# Patient Record
Sex: Female | Born: 1940 | Race: Black or African American | Hispanic: No | Marital: Single | Smoking: Current every day smoker
Health system: Southern US, Community
[De-identification: ages and names within clinical notes are randomized; demographics above are authoritative.]

## PROBLEM LIST (undated history)

## (undated) DIAGNOSIS — I454 Nonspecific intraventricular block: Secondary | ICD-10-CM

## (undated) DIAGNOSIS — I509 Heart failure, unspecified: Secondary | ICD-10-CM

## (undated) DIAGNOSIS — I493 Ventricular premature depolarization: Secondary | ICD-10-CM

## (undated) DIAGNOSIS — I2510 Atherosclerotic heart disease of native coronary artery without angina pectoris: Secondary | ICD-10-CM

## (undated) DIAGNOSIS — Z9889 Other specified postprocedural states: Secondary | ICD-10-CM

## (undated) DIAGNOSIS — Z95810 Presence of automatic (implantable) cardiac defibrillator: Secondary | ICD-10-CM

## (undated) DIAGNOSIS — E7800 Pure hypercholesterolemia, unspecified: Secondary | ICD-10-CM

## (undated) DIAGNOSIS — I10 Essential (primary) hypertension: Secondary | ICD-10-CM

## (undated) DIAGNOSIS — I255 Ischemic cardiomyopathy: Secondary | ICD-10-CM

## (undated) DIAGNOSIS — I213 ST elevation (STEMI) myocardial infarction of unspecified site: Secondary | ICD-10-CM

## (undated) HISTORY — DX: Pure hypercholesterolemia, unspecified: E78.00

## (undated) HISTORY — PX: OTHER SURGICAL HISTORY: SHX169

## (undated) HISTORY — DX: Other specified postprocedural states: Z98.890

## (undated) HISTORY — DX: Essential (primary) hypertension: I10

## (undated) HISTORY — PX: BREAST CYST EXCISION: SHX579

## (undated) HISTORY — DX: Atherosclerotic heart disease of native coronary artery without angina pectoris: I25.10

## (undated) HISTORY — PX: ABDOMINAL HYSTERECTOMY: SHX81

---

## 1996-07-22 DIAGNOSIS — I213 ST elevation (STEMI) myocardial infarction of unspecified site: Secondary | ICD-10-CM

## 1996-07-22 HISTORY — DX: ST elevation (STEMI) myocardial infarction of unspecified site: I21.3

## 2000-03-02 ENCOUNTER — Other Ambulatory Visit: Admission: RE | Admit: 2000-03-02 | Discharge: 2000-03-02 | Payer: Self-pay | Admitting: Gynecology

## 2000-12-16 ENCOUNTER — Encounter: Admission: RE | Admit: 2000-12-16 | Discharge: 2000-12-16 | Payer: Self-pay | Admitting: Internal Medicine

## 2000-12-16 ENCOUNTER — Encounter: Payer: Self-pay | Admitting: Gynecology

## 2001-03-09 ENCOUNTER — Other Ambulatory Visit: Admission: RE | Admit: 2001-03-09 | Discharge: 2001-03-09 | Payer: Self-pay | Admitting: Gynecology

## 2001-04-02 ENCOUNTER — Other Ambulatory Visit: Admission: RE | Admit: 2001-04-02 | Discharge: 2001-04-02 | Payer: Self-pay | Admitting: Gynecology

## 2001-04-02 ENCOUNTER — Encounter (INDEPENDENT_AMBULATORY_CARE_PROVIDER_SITE_OTHER): Payer: Self-pay

## 2001-04-05 ENCOUNTER — Ambulatory Visit (HOSPITAL_COMMUNITY): Admission: RE | Admit: 2001-04-05 | Discharge: 2001-04-05 | Payer: Self-pay | Admitting: Cardiology

## 2001-04-05 ENCOUNTER — Encounter: Payer: Self-pay | Admitting: Cardiology

## 2002-11-07 ENCOUNTER — Other Ambulatory Visit: Admission: RE | Admit: 2002-11-07 | Discharge: 2002-11-07 | Payer: Self-pay | Admitting: Gynecology

## 2002-12-06 ENCOUNTER — Encounter: Admission: RE | Admit: 2002-12-06 | Discharge: 2002-12-06 | Payer: Self-pay | Admitting: Gynecology

## 2002-12-06 ENCOUNTER — Encounter: Payer: Self-pay | Admitting: Gynecology

## 2004-05-01 ENCOUNTER — Other Ambulatory Visit: Admission: RE | Admit: 2004-05-01 | Discharge: 2004-05-01 | Payer: Self-pay | Admitting: Gynecology

## 2006-03-16 ENCOUNTER — Other Ambulatory Visit: Admission: RE | Admit: 2006-03-16 | Discharge: 2006-03-16 | Payer: Self-pay | Admitting: Gynecology

## 2006-06-03 DIAGNOSIS — Z9889 Other specified postprocedural states: Secondary | ICD-10-CM

## 2006-06-03 HISTORY — DX: Other specified postprocedural states: Z98.890

## 2006-06-09 ENCOUNTER — Encounter (INDEPENDENT_AMBULATORY_CARE_PROVIDER_SITE_OTHER): Payer: Self-pay | Admitting: Specialist

## 2006-06-09 ENCOUNTER — Ambulatory Visit: Payer: Self-pay | Admitting: Gastroenterology

## 2006-06-09 ENCOUNTER — Ambulatory Visit (HOSPITAL_COMMUNITY): Admission: RE | Admit: 2006-06-09 | Discharge: 2006-06-09 | Payer: Self-pay | Admitting: Gastroenterology

## 2008-02-29 ENCOUNTER — Ambulatory Visit: Payer: Self-pay | Admitting: Cardiovascular Disease

## 2008-03-13 ENCOUNTER — Ambulatory Visit: Payer: Self-pay | Admitting: Cardiology

## 2008-03-13 ENCOUNTER — Encounter (HOSPITAL_COMMUNITY): Admission: RE | Admit: 2008-03-13 | Discharge: 2008-04-12 | Payer: Self-pay | Admitting: Cardiovascular Disease

## 2008-03-13 ENCOUNTER — Encounter: Payer: Self-pay | Admitting: Cardiovascular Disease

## 2008-06-01 ENCOUNTER — Ambulatory Visit: Payer: Self-pay | Admitting: Cardiovascular Disease

## 2008-06-01 ENCOUNTER — Ambulatory Visit (HOSPITAL_COMMUNITY): Admission: RE | Admit: 2008-06-01 | Discharge: 2008-06-01 | Payer: Self-pay | Admitting: Cardiovascular Disease

## 2009-01-22 ENCOUNTER — Ambulatory Visit: Payer: Self-pay | Admitting: Cardiology

## 2009-03-27 ENCOUNTER — Ambulatory Visit (HOSPITAL_COMMUNITY): Admission: RE | Admit: 2009-03-27 | Discharge: 2009-03-27 | Payer: Self-pay | Admitting: Gynecology

## 2009-06-06 ENCOUNTER — Encounter: Payer: Self-pay | Admitting: Cardiology

## 2009-06-08 ENCOUNTER — Encounter: Payer: Self-pay | Admitting: Cardiology

## 2009-06-11 LAB — CONVERTED CEMR LAB
ALT: 10 units/L (ref 0–35)
AST: 13 units/L (ref 0–37)
Bilirubin, Direct: 0.1 mg/dL (ref 0.0–0.3)
Cholesterol: 143 mg/dL (ref 0–200)
Indirect Bilirubin: 0.5 mg/dL (ref 0.0–0.9)
Total CHOL/HDL Ratio: 3.5

## 2010-01-11 ENCOUNTER — Ambulatory Visit: Payer: Self-pay | Admitting: Cardiology

## 2010-01-11 DIAGNOSIS — I2510 Atherosclerotic heart disease of native coronary artery without angina pectoris: Secondary | ICD-10-CM

## 2010-01-11 DIAGNOSIS — E785 Hyperlipidemia, unspecified: Secondary | ICD-10-CM

## 2010-01-11 DIAGNOSIS — I252 Old myocardial infarction: Secondary | ICD-10-CM

## 2010-01-11 DIAGNOSIS — F17200 Nicotine dependence, unspecified, uncomplicated: Secondary | ICD-10-CM | POA: Insufficient documentation

## 2010-01-17 ENCOUNTER — Encounter (HOSPITAL_COMMUNITY): Admission: RE | Admit: 2010-01-17 | Discharge: 2010-02-16 | Payer: Self-pay | Admitting: Cardiology

## 2010-01-17 ENCOUNTER — Ambulatory Visit: Payer: Self-pay | Admitting: Cardiology

## 2010-01-17 ENCOUNTER — Encounter: Payer: Self-pay | Admitting: Cardiology

## 2010-01-21 ENCOUNTER — Encounter (INDEPENDENT_AMBULATORY_CARE_PROVIDER_SITE_OTHER): Payer: Self-pay | Admitting: *Deleted

## 2010-01-21 ENCOUNTER — Encounter: Payer: Self-pay | Admitting: Cardiology

## 2010-01-21 LAB — CONVERTED CEMR LAB
Alkaline Phosphatase: 75 units/L (ref 39–117)
BUN: 8 mg/dL (ref 6–23)
Glucose, Bld: 96 mg/dL (ref 70–99)
HDL: 41 mg/dL (ref >39)
LDL Cholesterol: 73 mg/dL (ref 0–99)
Total Bilirubin: 0.9 mg/dL (ref 0.3–1.2)
Triglycerides: 104 mg/dL (ref <150)
VLDL: 21 mg/dL (ref 0–40)

## 2010-01-23 ENCOUNTER — Encounter: Payer: Self-pay | Admitting: Cardiology

## 2010-01-23 ENCOUNTER — Ambulatory Visit: Payer: Self-pay | Admitting: Cardiology

## 2010-03-29 ENCOUNTER — Ambulatory Visit (HOSPITAL_COMMUNITY): Admission: RE | Admit: 2010-03-29 | Discharge: 2010-03-29 | Payer: Self-pay | Admitting: Gynecology

## 2010-06-14 ENCOUNTER — Encounter: Payer: Self-pay | Admitting: Cardiology

## 2010-11-25 ENCOUNTER — Encounter: Payer: Self-pay | Admitting: Cardiovascular Disease

## 2010-12-03 NOTE — Letter (Signed)
Summary: [place] [facility], agricultural at [facility]  [address], [place] [address]   Phone: [phone]  Fax: [phone]      January 21, 2010 MRN: [id number]   Nicole Clements [address] [place], [place]  [address]   Dear Ms. Luciana Axe,  Your test ordered by Selena Batten has been reviewed by your physician (or physician assistant) and was found to be normal or stable. Your physician (or physician assistant) felt no changes were needed at this time.  ____ Echocardiogram  __x__ Cardiac Stress Test  __x__ Lab Work  ____ Peripheral vascular study of arms, legs or neck  ____ CT scan or X-ray  ____ Lung or Breathing test  ____ Other: No change in medical treatment at this time, per Dr. Daleen Squibb.  We will obtain an echocardiogram to access left ventricular function, no other tests needed at this time.   Thank you, Latresa Gasser Allyne Gee RN    [place] Bing, MD, Lenise Arena.C.Gaylord Shih, MD, F.A.C.C Lewayne Bunting, MD, F.A.C.C Nona Dell, MD, F.A.C.C Charlton Haws, MD, Lenise Arena.C.C

## 2010-12-03 NOTE — Letter (Signed)
Summary: [place] [facility] (Nuc Med Stress)  [facility]  [address] [place] [address]   Phone: [phone]  Fax: [phone]    Nuclear Medicine 1-Day Stress Test Information Sheet  Re:     Nicole Clements   DOB:     December 25, 1940 MRN:     [id number] Weight:  Appointment Date: Register at: Appointment Time: Referring MD:  ___Exercise Stress  __Adenosine   __Dobutamine  _X_Lexiscan  __Persantine   __Thallium  Urgency: ____1 (next day)   ____2 (one week)    ____3 (PRN)  Patient will receive Follow Up call with results: Patient needs follow-up appointment:  Instructions regarding medication:  How to prepare for your stress test: 1. DO NOT eat or dring 6 hours prior to your arrival time. This includes no caffeine (coffee, tea, sodas, chocolate) if you were instructed to take your medications, drink water with it. 2. DO NOT use any tobacco products for at leaset 8 hours prior to arrival. 3. DO NOT wear dresses or any clothing that may have metal clasps or buttons. 4. Wear short sleeve shirts, loose clothing, and comfortalbe walking shoes. 5. DO NOT use lotions, oils or powder on your chest before the test. 6. The test will take approximately 3-4 hours from the time you arrive until completion. 7. To register the day of the test, go to the Short Stay entrance at [facility]. 8. If you must cancel your test, call [phone] as soon as you are aware.  After you arrive for test:   When you arrive at [facility], you will go to Short Stay to be registered. They will then send you to Radiology to check in. The Nuclear Medicine Tech will get you and start an IV in your arm or hand. A small amount of a radioactive tracer will then be injected into your IV. This tracer will then have to circulate for 30-45 minutes. During this time you will wait in the waiting room and you will be able to drink something without caffeine. A series of pictures will be taken of  your heart follwoing this waiting period. After the 1st set of pictures you will go to the stress lab to get ready for your stress test. During the stress test, another small amount of a radioactive tracer will be injected through your IV. When the stress test is complete, there is a short rest period while your heart rate and blood pressure will be monitored. When this monitoring period is complete you will have another set of pictrues taken. (The same as the 1st set of pictures). These pictures are taken between 15 minutes and 1 hour after the stress test. The time depends on the type of stress test you had. Your doctor will inform you of your test results within 7 days after test.    The possibilities of certain changes are possible during the test. They include abnormal blood pressure and disorders of the heart. Side effects of persantine or adenosine can include flushing, chest pain, shortness of breath, stomach tightness, headache and light-headedness. These side effects usually do not last long and are self-resolving. Every effort will be made to keep you comfortable and to minimize complications by obtaining a medical history and by close observation during the test. Emergency equipment, medications, and trained personnel are available to deal with any unusual situation which may arise.  Please notify office at least 48 hours in advance if you are unable to keep  this appt.

## 2010-12-03 NOTE — Letter (Signed)
Summary: [facility] OFFICE NOTE 06-14-10  [facility] OFFICE NOTE 06-14-10   Imported By: Faythe Ghee 06/14/2010 15:12:26  _____________________________________________________________________  External Attachment:    Type:   Image     Comment:   External Document

## 2010-12-03 NOTE — Assessment & Plan Note (Signed)
Summary: F1Y  Medications Added AMLODIPINE BESYLATE 10 MG TABS (AMLODIPINE BESYLATE) take 1 tablet by mouth once daily SIMVASTATIN 40 MG TABS (SIMVASTATIN) take 1 tab daily ASPIR-TRIN 325 MG TBEC (ASPIRIN) take 1 tab daily VITAMIN C CR 500 MG CR-TABS (ASCORBIC ACID) take 1 tab daily VITAMIN D3 2000 UNIT CAPS (CHOLECALCIFEROL) take 1 cap daily VITAMIN E 400 UNIT CAPS (VITAMIN E) take 1 tab daily FISH OIL 1200 MG CAPS (OMEGA-3 FATTY ACIDS) take 2 caps daily CITRACAL PLUS  TABS (MULTIPLE MINERALS-VITAMINS) take 1 tab daily ONE-A-DAY WOMENS FORMULA  TABS (MULTIPLE VITAMINS-CALCIUM) take 1 tab daily      Allergies Added: NKDA  Visit Type:  Follow-up Primary Provider:  [facility] [place]  CC:  no complaints today.  History of Present Illness: Ms Hizer returns today for further evaluation and management of her coronary artery disease.  She is having no angina or ischemic symptoms. She does have some dyspnea on exertion. She still smokes quite heavily.  Her blood pressures always good. His slightly elevated here today.  She denies orthopnea, PND, peripheral edema, palpitations or syncope.  She is taking simvastatin. Lipids were near goal in August. We need to repeat them.    Current Medications (verified): 1)  Amlodipine Besylate 10 Mg Tabs (Amlodipine Besylate) .... Take 1 Tablet By Mouth Once Daily 2)  Simvastatin 40 Mg Tabs (Simvastatin) .... Take 1 Tab Daily 3)  Aspir-Trin 325 Mg Tbec (Aspirin) .... Take 1 Tab Daily 4)  Vitamin C Cr 500 Mg Cr-Tabs (Ascorbic Acid) .... Take 1 Tab Daily 5)  Vitamin D3 2000 Unit Caps (Cholecalciferol) .... Take 1 Cap Daily 6)  Vitamin E 400 Unit Caps (Vitamin E) .... Take 1 Tab Daily 7)  Fish Oil 1200 Mg Caps (Omega-3 Fatty Acids) .... Take 2 Caps Daily 8)  Citracal Plus  Tabs (Multiple Minerals-Vitamins) .... Take 1 Tab Daily 9)  One-A-Day Womens Formula  Tabs (Multiple Vitamins-Calcium) .... Take 1 Tab Daily  Allergies  (verified): No Known Drug Allergies  Review of Systems       negative other than history of present illness  Vital Signs:  Patient profile:   70 year old female Height:      62 inches Weight:      180 pounds BMI:     33.04 Pulse rate:   100 / minute BP sitting:   154 / 79  (right arm)  Vitals Entered By: Dreama Saa, CNA (January 11, 2010 2:34 PM)  Physical Exam  General:  obese.  obese.   Head:  normocephalic and atraumatic Eyes:  PERRLA/EOM intact; conjunctiva and lids normal. Neck:  Neck supple, no JVD. No masses, thyromegaly or abnormal cervical nodes. Chest Keerstin Bjelland:  no deformities or breast masses noted Lungs:  Clear bilaterally to auscultation and percussion. Heart:  regular rate and rhythm, normal S1-S2, PMI poorly appreciated, soft systolic sound at the base of the right neck. Abdomen:  Bowel sounds positive; abdomen soft and non-tender without masses, organomegaly, or hernias noted. No hepatosplenomegaly. Msk:  Back normal, normal gait. Muscle strength and tone normal. Pulses:  pulses normal in all 4 extremities Extremities:  No clubbing or cyanosis. Neurologic:  Alert and oriented x 3. Skin:  Intact without lesions or rashes. Psych:  Normal affect.   Impression & Recommendations:  Problem # 1:  CORONARY ATHEROSCLEROSIS NATIVE CORONARY ARTERY (ICD-414.01) Her last Myoview was in May 2009. With her dyspnea on exertion and ongoing risk factors, we'll repeat. Her updated medication list for this  problem includes:    Amlodipine Besylate 10 Mg Tabs (Amlodipine besylate) .[name]... Take 1 tablet by mouth once daily    Aspir-trin 325 Mg Tbec (Aspirin) .[name]... 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 Take 1 tablet by mouth once daily    Aspir-trin 325 Mg Tbec  (Aspirin) .[name]... Take 1 tab daily  Problem # 3:  DYSLIPIDEMIA (ICD-272.4) Assessment: Improved I will check fasting lipids and a comprehensive metabolic panel. Her updated medication list for this problem includes:    Simvastatin 40 Mg Tabs (Simvastatin) .[name]... Take 1 tab daily  Orders: T-Lipid Profile ([address]) T-Comprehensive Metabolic Panel ([address])  Problem # 4:  TOBACCO USER (ICD-305.1) Assessment: Unchanged advised to quit  Patient Instructions: 1)  Your physician recommends that you schedule a follow-up appointment in: 12 months 2)  Your physician has requested that you have anLexiscan myoview.  For further information please visit [email or web address]  Please follow instruction sheet, as given. 3)  Your physician recommends that you return for lab work in: on same day as [employer] Prescriptions: SIMVASTATIN 40 MG TABS (SIMVASTATIN) take 1 tab daily  #90 x 3   Entered by:   Larita Fife Via LPN   Authorized by:   Gaylord Shih, MD, [facility]   Signed by:   Larita Fife Via LPN on 29/56/2130   Method used:   Electronically to        [facility] (retail)       [address]       [place], [place]  [address]       Ph: [phone]       Fax: [phone]   RxID:   [id number] AMLODIPINE BESYLATE 10 MG TABS (AMLODIPINE BESYLATE) take 1 tablet by mouth once daily  #90 x 3   Entered by:   Larita Fife Via LPN   Authorized by:   Gaylord Shih, MD, [facility]   Signed by:   Larita Fife Via LPN on 63/87/5643   Method used:   Electronically to        [facility] (retail)       [address]       [place], [place]  [address]       Ph: [phone]       Fax: [phone]   RxID:   [id number]

## 2010-12-03 NOTE — Miscellaneous (Signed)
Summary: Orders Update  Clinical Lists Changes  Problems: Added new problem of LEFT VENTRICULAR FUNCTION, DECREASED (ICD-429.2) Orders: Added new Referral order of 2-D Echocardiogram (2D Echo) - Signed

## 2011-01-22 ENCOUNTER — Other Ambulatory Visit: Payer: Self-pay | Admitting: Cardiology

## 2011-01-24 ENCOUNTER — Telehealth: Payer: Self-pay | Admitting: Cardiology

## 2011-01-24 DIAGNOSIS — I2510 Atherosclerotic heart disease of native coronary artery without angina pectoris: Secondary | ICD-10-CM

## 2011-01-24 MED ORDER — AMLODIPINE BESYLATE 10 MG PO TABS: 10.0000 mg | ORAL_TABLET | Freq: Every day | ORAL | Status: DC

## 2011-01-24 NOTE — Telephone Encounter (Signed)
Patient needs refill called in for Amlodopine to [facility] in [place] / pt has appt scheduled in May w/Dr.Wall/tg

## 2011-03-03 ENCOUNTER — Other Ambulatory Visit: Payer: Self-pay | Admitting: *Deleted

## 2011-03-07 ENCOUNTER — Encounter: Payer: Self-pay | Admitting: Cardiology

## 2011-03-07 ENCOUNTER — Other Ambulatory Visit: Payer: Self-pay

## 2011-03-07 ENCOUNTER — Ambulatory Visit (INDEPENDENT_AMBULATORY_CARE_PROVIDER_SITE_OTHER): Payer: Medicare Other | Admitting: Cardiology

## 2011-03-07 DIAGNOSIS — I5022 Chronic systolic (congestive) heart failure: Secondary | ICD-10-CM

## 2011-03-07 DIAGNOSIS — I252 Old myocardial infarction: Secondary | ICD-10-CM

## 2011-03-07 DIAGNOSIS — I2510 Atherosclerotic heart disease of native coronary artery without angina pectoris: Secondary | ICD-10-CM

## 2011-03-07 DIAGNOSIS — F17200 Nicotine dependence, unspecified, uncomplicated: Secondary | ICD-10-CM

## 2011-03-07 DIAGNOSIS — E785 Hyperlipidemia, unspecified: Secondary | ICD-10-CM

## 2011-03-07 MED ORDER — AMLODIPINE BESYLATE 10 MG PO TABS: 10.0000 mg | ORAL_TABLET | Freq: Every day | ORAL | Status: DC

## 2011-03-07 MED ORDER — CARVEDILOL 6.25 MG PO TABS: 6.2500 mg | ORAL_TABLET | Freq: Two times a day (BID) | ORAL | Status: DC

## 2011-03-07 MED ORDER — SIMVASTATIN 40 MG PO TABS: 40.0000 mg | ORAL_TABLET | Freq: Every day | ORAL | Status: DC

## 2011-03-07 NOTE — Assessment & Plan Note (Signed)
Advised to quit.  

## 2011-03-07 NOTE — Assessment & Plan Note (Signed)
Stable. Will add carvedilol which I started years ago for LV preservation and repeat Echo. I will probably add spironolactone as well once her blood work returns. She had a reaction to accupril in the past with diarrhea and refuses to take. I asked her to stop smoking again.

## 2011-03-07 NOTE — Assessment & Plan Note (Signed)
Will reassess LV systolic function with an Echo.

## 2011-03-07 NOTE — Progress Notes (Signed)
   Patient ID: Nicole Clements, female    DOB: 12-21-1940, 70 y.o.   MRN: [id number]  HPI  Ms Ceniceros returns for E and M of her Ischemic CM, chronic SCHF, and hyperlipidemia. She denies any angina or chest pain. She has her baseline DOE. S he still smokes a PPD. N o orthopnea or edema.   Last objective assessment of her CAD was 3/11 with Myoview showing an EF of 29% with mild LV dilatation. N o blood work since last year.  She denies any major wheezing.  EKG today show NSR with IVCD WHICH IS NEW.    Review of Systems  All other systems reviewed and are negative.      Physical Exam  Constitutional: She is oriented to person, place, and time. She appears well-developed and well-nourished. No distress.  HENT:  Head: Normocephalic and atraumatic.  Eyes: EOM are normal. Pupils are equal, round, and reactive to light.  Neck: Normal range of motion. No JVD present. No tracheal deviation present. No thyromegaly present.  Cardiovascular: Normal rate, regular rhythm, S1 normal, S2 normal and normal pulses.   No extrasystoles are present. PMI is displaced.  Exam reveals no S3.   No murmur heard. Pulmonary/Chest:       Decreased breath sounds throughout, no wheezes, rhonchi, or rales  Abdominal: Soft. Bowel sounds are normal.  Musculoskeletal: Normal range of motion. She exhibits no edema.  Neurological: She is alert and oriented to person, place, and time.  Skin: Skin is warm and dry.  Psychiatric: She has a normal mood and affect.

## 2011-03-07 NOTE — Patient Instructions (Signed)
Your physician has requested that you have an echocardiogram. Echocardiography is a painless test that uses sound waves to create images of your heart. It provides your doctor with information about the size and shape of your heart and how well your heart's chambers and valves are working. This procedure takes approximately one hour. There are no restrictions for this procedure.  Your physician recommends that you return for lab work in: Next week  Your physician has recommended you make the following change in your medication: Start taking Coreg 6.25 twice daily, stop taking Multi vitamin, Vitamins C and D.  Your physician discussed the hazards of tobacco use. Tobacco use cessation is recommended and techniques and options to help you quit were discussed.  Your physician recommends that you schedule a follow-up appointment in: 6 months

## 2011-03-07 NOTE — Assessment & Plan Note (Signed)
Needs updated labs.

## 2011-03-12 ENCOUNTER — Other Ambulatory Visit: Payer: Self-pay | Admitting: Cardiology

## 2011-03-13 ENCOUNTER — Ambulatory Visit (HOSPITAL_COMMUNITY)
Admission: RE | Admit: 2011-03-13 | Discharge: 2011-03-13 | Disposition: A | Discharge status: Home / Self Care | Payer: Medicare Other | Source: Ambulatory Visit | Attending: Cardiology | Admitting: Cardiology

## 2011-03-13 DIAGNOSIS — I10 Essential (primary) hypertension: Secondary | ICD-10-CM | POA: Insufficient documentation

## 2011-03-13 DIAGNOSIS — I509 Heart failure, unspecified: Secondary | ICD-10-CM | POA: Insufficient documentation

## 2011-03-13 DIAGNOSIS — I059 Rheumatic mitral valve disease, unspecified: Secondary | ICD-10-CM

## 2011-03-13 LAB — HEPATIC FUNCTION PANEL
AST: 13 U/L (ref 0–37)
Alkaline Phosphatase: 68 U/L (ref 39–117)
Bilirubin, Direct: 0.1 mg/dL (ref 0.0–0.3)
Total Bilirubin: 0.6 mg/dL (ref 0.3–1.2)

## 2011-03-18 NOTE — Assessment & Plan Note (Signed)
[facility]                       [place] CARDIOLOGY OFFICE NOTE   Nicole Clements, Nicole Clements                        MRN:          [id number]  DATE:02/29/2008                            DOB:          May 31, 1941    REASON FOR VISIT:  Nicole Clements is seen today at the request of Dr.  Arvilla Market to be reestablished and F/U of CAD, HTN and increased  cholesterol.   HISTORY:  The patient has previously been seen by Dr. Daleen Squibb back in 2002.  It appears that his letter from May 2002 said that he was managing her  ischemic heart disease with  mild-moderate LV dysfunction, hypertension, hyperlipidemia.  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  Her last Myoview study was done in 2002 which showed normal  perfusion and an EF of 55%.  She previously had a Myoview back in 2000  showing an EF of 44% with global hypokinesis.   There is also mention of PVD in the chart with a left carotid bruit, but  an ultrasound showing no significant disease.  The patient continues to  smoke.  She has a positive family for coronary disease, hyperlipidemia,  untreated and hypertension, untreated.   In the past, she apparently has had a problem with lisinopril or  Accupril causing loose stools.  She previously had tolerated and been on  Norvasc.  The patient has been having some exertional dyspnea.  It seems  functional.  I do not think it is an anginal equivalent.  She has  gained some weight over the last year or two.  She has not had  significant cough or wheezing.  She has not had palpitations, PND,  orthopnea.  No history of PE or DVT.   PAST MEDICAL HISTORY:  1. Remarkable for previous MI in 1997.  2. Outpatient colonoscopy in 2007.  3. Hypertension.  4. Hyperlipidemia.   SOCIAL HISTORY:  She smokes a pack a day.   ALLERGIES:  She has been intolerant to  LISINOPRIL in the past due to the  loose stools.  NO OTHER KNOWN ALLERGIES.   CURRENT MEDICATIONS:  1. Aspirin a day.  2. Caltrate.  3. Vitamin C.  4. Vitamin E.   SOCIAL HISTORY:  She smokes a pack a day.  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  She is not married.  She  has 1 child who lives in [place].  She is otherwise fairly active.  She  does not drink and smokes a pack a day.   FAMILY HISTORY:  Father is unknown.  Mother had a CABG, but at an  elderly age in her 31s.   PHYSICAL EXAMINATION:  VITAL SIGNS:  Blood pressure 158/92, pulse 70 and  regular, afebrile, respiratory rate 16, weight is 185.  HEENT:  Unremarkable.  NECK:  I do not hear a left carotid bruit.  No lymphadenopathy,  thyromegaly, JVP elevation.  LUNGS:  Clear with diaphragmatic  motion.  No wheezing.  HEART:  S1-S2 with normal heart sounds.  PMI normal.  ABDOMEN:  Benign, bowel sounds are positive.  No AAA.  No tenderness.  No hepatosplenomegaly or hepatojugular reflux.  No bruit.  EXTREMITIES:  Distal pulses are intact.  No edema.  No muscular  weakness.  NEURO:  Nonfocal.  SKIN:  Warm and dry.   IMPRESSION:  1. Distant history of an inferior wall myocardial infarction with      variable ejection fractions.  Follow up adenosine Myoview. Continue      aspirin therapy.  2. Question history of cardiomyopathy with ejection fraction 44%.      Check 2-D echocardiogram to reassess LV function and regional wall      motion abnormalities.  3. Hypertension.  Restart Norvasc 10 mg a day.  Follow up in 3 months.      Avoid salt.  4. History of left carotid bruit.  I do not hear one on exam today.      Her previous ultrasound showed no significant disease.  For the      time being, we will not reorder one.  5. Smoking.  The patient needs to quit.  We will discuss Wellbutrin or      Chantix in the future.  She is currently not very motivated in this      regard.  She has  smoked for over 45 years.   FOLLOW UP:  The patient will follow with Dr. Arvilla Market for general  medical needs.  So long as her stress test and echo do not show  decreased LV function or evidence of recurrent ischemic disease, I will  see her in 3 months to reassess her blood pressure on Norvasc.     Noralyn Pick. Eden Emms, MD, [facility]  Electronically Signed    PCN/MedQ  DD: 02/29/2008  DT: 02/29/2008  Job #: [id number]   cc:   Donia Guiles, M.D.

## 2011-03-18 NOTE — Assessment & Plan Note (Signed)
[facility]                       [place] CARDIOLOGY OFFICE NOTE   Nicole Clements, Nicole Clements                        MRN:          [id number]  DATE:01/22/2009                            DOB:          [birth year]    Nicole Clements comes in today for followup.   I saw her initially in 1997.   PROBLEM LIST:  Coronary artery disease.  She had ST-segment elevated  inferior wall infarct July 22, 1996.  At the time she had an  emergency catheterization and 95% stenosis in the distal right coronary  artery.  She had mild inferior hypokinesia.  She had an angioplasty  performed.   She has done remarkably well since then.  Unfortunately, she has stopped  her statin since I met her years ago and also continues to smoke.   She is just retired from [employer] after working 41 years and  commuting from [place] to [place].   She last saw Dr. Eden Emms on her office on June 01, 2008.  She had a  followup Myoview Mar 13, 2008 that showed no ischemia, EF of 35%.  She  had moderate hypokinesia in inferoseptal segments.  Subsequent echo  showed EF 45% with akinesia in inferior basal wall.  There was no  significant mitral regurgitation.   She was asked to return for bilateral carotid ultrasound, but apparently  did not.   PAST MEDICAL HISTORY:   MEDICATIONS:  Currently on  1. Aspirin 325 mg a day.  2. Norvasc 10 mg a day.  3. Fish oil 1200 mg a day.  4. Multivitamin.  5. Vitamin D.   ALLERGIES:  She is intolerant of COREG, which caused diarrhea.  She  really does not want to take an ACE inhibitor, just wants to stick with  her Norvasc.  She has no other drug allergies.   PHYSICAL EXAMINATION:  VITAL SIGNS:  Her blood pressure today is 104/76,  her pulse is 70 and regular.  Weight is 179 down 4.  HEENT:  Normal.  NECK:  Supple.  Carotids upstrokes are equal bilaterally with soft  systolic sounds.  Thyroid is not enlarged.  Trachea is midline.  LUNGS:  Clear with no rhonchi or wheezes.  HEART:  Reveals a regular rate and rhythm.  No gallop, rub, or murmur.  ABDOMEN:  Soft, good bowel sounds.  No pulsatile mass.  EXTREMITIES:  Shows cyanosis, clubbing, or edema.  Pulses were brisk.  NEUROLOGIC:  Intact.   ASSESSMENT AND PLAN:  Nicole Clements is stable from our standpoint.  She is  currently having no symptoms of angina or ischemia.  She has a mild  reduction in left ventricular function, but does not want to take a beta-  blocker or an ACE inhibitor.  She continues to smoke and has resisted  this for years, to try to stop.   I have talked her into taking simvastatin 40 mg p.o. at bedtime.  We  will follow up lipids comprehensive metabolic panel in 6 weeks.  She  will continue on her other medications.  I will see her back again in a  year.     Jesse Sans. Daleen Squibb, MD, [facility]  Electronically Signed    TCW/MedQ  DD: 01/22/2009  DT: 01/23/2009  Job #: [id number]   cc:   Donia Guiles, M.D.

## 2011-03-18 NOTE — Assessment & Plan Note (Signed)
[facility]                       [place] CARDIOLOGY OFFICE NOTE   MATHEW, STORCK                        MRN:          [id number]  DATE:06/01/2008                            DOB:          Mar 11, 1941    Nicole Clements was seen today in followup.  She has a distant history of an  MI.  She had followup Myoview done on Mar 13, 2008.  She had what  appeared to be global hypokinesis with moderate hypokinesis in the basal  inferior and septal segments.  Estimated EF was 25%.   However, she had a follow up echocardiogram on Mar 13, 2008.  EF was  estimated at 45% with akinesis of the inferior basal wall with a  moderate hypokinesis of the basal anterior wall and portions of the  septum.   The patient is currently asymptomatic.  She is not having chest pain.  There is really no significant ischemia on her Myoview.  I talked to the  patient at length.  She clearly has coronary artery disease.  Her MI was  very distant apparently at the RCA with angioplasty back in 2000.   Back at that time, a Myoview showed an EF of 44% with global  hypokinesis.   She also has bilateral carotid bruits.  She needs a follow up carotid  duplex.   I told Nicole Clements that she would have significant problems in the next 5 years  if she does not stop smoking.  She is already had advanced vascular  disease.  She has a bronchitic voice.  She needs a followup chest x-ray,  since she has not had one in over a year.  She really does not see her  primary care doctor, Dr. Arvilla Market, unless she has an acute problem.   REVIEW OF SYSTEMS:  Remarkable for no cough or sputum production.  No  hemoptysis.  She has not had signs of claudication.  There has been no  abdominal pain.  There has been no TIA and she is not having chest pain.  She does have exertional dyspnea and have poor exercise tolerance on the  stress test.   CURRENT MEDICATIONS:  Aspirin a day, Norvasc 10 a day, vitamin D, and  multivitamins.   ALLERGIES:  She has no known allergies.   PHYSICAL EXAMINATION:  GENERAL:  Remarkable for bronchitic female, who  looks older than her stated age.  She is overweight.  VITAL SIGNS:  Weight is 182; blood pressure 140/60; pulse 76 and  regular; and respiratory rate 14, afebrile.  HEENT:  Unremarkable.  NECK:  She has bilateral carotid bruits.  No lymphadenopathy,  thyromegaly, or JVP elevation.  LUNGS:  Clear, good diaphragmatic motion.  No wheezing.  CARDIAC:  S1 and S2.  Normal heart sounds.  ABDOMEN:  Benign.  Bowel sounds positive.  No AAA.  No tenderness.  No  bruit.  No hepatosplenomegaly.  No hepatojugular reflux.  EXTREMITIES:  Distal pulses are intact.  No edema.  NEURO:  Nonfocal.  SKIN:  Warm and dry.  MUSCULOSKELETAL:  No muscular weakness.   IMPRESSION:  1. Coronary artery  disease, distant, currently asymptomatic.  No      ischemia on the Myoview.  The patient would like to continue      Norvasc for the time being.  She has previously had a cough with      ACE inhibitors and did not tolerate Coreg due to fatigue at some      point in time.  It may be reasonable to reassess her ability to      take an ARB or another beta-blocker given her coronary artery      disease, and decreased left ventricular function.  However, Nicole Clements      does not want to consider this at this time and thinks the Norvasc      is doing  a good job with her blood pressure.  2. Bilateral carotid bruits.  Followup carotid duplex.  3. Hypertension improved, controlled on Norvasc, continue this and low-      salt diet.  4. Smoking cessation.  Script for Wellbutrin given and I encouraged      her to follow up with Dr. Arvilla Market, as she has not had a chest x-      ray in over a year, we will do one today.  Risks of advancing her      vascular disease and cancer were discussed at length with the      patient, counseled her for less than 10 minutes regarding smoking      cessation.  5.  Hyperlipidemia.  I will have to call Dr. Roselie Skinner office.  I do      not have recent lab work on her.  There is an old lab in the chart      from Dr. Daleen Squibb showing an LDL was 94.  I suspect that she would      benefit from being on a statin drug.  I will let Dr. Daleen Squibb discuss      this with her on his next visit.  6. Overall Nicole Clements continues to have issues regarding her smoking and      vascular disease, so long as her chest x-ray and carotid duplex do      not show any advanced disease, she will see Dr. Daleen Squibb back in 6      months.     Noralyn Pick. Eden Emms, MD, [facility]  Electronically Signed    PCN/MedQ  DD: 06/01/2008  DT: 06/01/2008  Job #: [id number]

## 2011-03-21 NOTE — Op Note (Signed)
NAME:  Nicole Clements, Nicole Clements                 ACCOUNT NO.:  [id number]   MEDICAL RECORD NO.:  [id number]          PATIENT TYPE:  AMB   LOCATION:  DAY                           FACILITY:  [facility]   PHYSICIAN:  Kassie Mends, M.D.      DATE OF BIRTH:  [birth year]   DATE OF PROCEDURE:  06/09/2006  DATE OF DISCHARGE:                                 OPERATIVE REPORT   PROCEDURE PERFORMED:  Colonoscopy with cold forceps biopsy.   INDICATIONS FOR PROCEDURE:  Nicole Clements is a 70 year old female who presents  for average risk colon cancer screening.   MEDICATIONS:  1. Demerol 50 mg IV.  2. Versed 3 mg IV.   FINDINGS:  1. Three 4 to 6 mm ascending colon and sigmoid colon polyps removed via      cold forceps.  2. Rare sigmoid and descending colon diverticulosis.  3. Small internal hemorrhoids.  4. Otherwise no masses, inflammatory changes or vascular ectasia seen.   RECOMMENDATIONS:  1. Follow-up biopsy.  If polyps are adenomatous, next screening      colonoscopy in five years.  2. High fiber diet.  She was given a hand-out on high fiber diet and      diverticulosis and hemorrhoids.  3. Follow up with Dr. Beather Arbour.   PROCEDURE TECHNIQUE:  An informed consent was obtained.  Physical exam was  performed and informed consent was obtained from the patient after  discussing the alternatives, risks and benefits of the procedure with the  patient which the patient appeared to understand and so stated.  The patient  was placed in the left lateral position.  Continuous oxygen was provided by  nasal cannula and IV medicine administered through an indwelling cannula.  After sedation and rectal exam, the patient's rectum was intubated and the  scope was advanced under direct  visualization to the cecum.  The scope was withdrawn slowly and the mucosa  integrity and anatomy was subsequently evaluated on the way out.  The  patient was recovered in the endoscopy suite and discharged to home in  satisfactory  condition.      Kassie Mends, M.D.  Electronically Signed     SM/MEDQ  D:  06/10/2006  T:  06/10/2006  Job:  [id number]   cc:   Gretta Cool, M.D.  Fax: [phone]

## 2011-03-28 ENCOUNTER — Other Ambulatory Visit (HOSPITAL_COMMUNITY): Payer: Self-pay | Admitting: Gynecology

## 2011-03-28 DIAGNOSIS — Z139 Encounter for screening, unspecified: Secondary | ICD-10-CM

## 2011-04-04 ENCOUNTER — Ambulatory Visit (HOSPITAL_COMMUNITY)
Admission: RE | Admit: 2011-04-04 | Discharge: 2011-04-04 | Disposition: A | Discharge status: Home / Self Care | Payer: Medicare Other | Source: Ambulatory Visit | Attending: Gynecology | Admitting: Gynecology

## 2011-04-04 DIAGNOSIS — Z139 Encounter for screening, unspecified: Secondary | ICD-10-CM

## 2011-04-04 DIAGNOSIS — Z1231 Encounter for screening mammogram for malignant neoplasm of breast: Secondary | ICD-10-CM | POA: Insufficient documentation

## 2011-06-05 ENCOUNTER — Encounter: Payer: Self-pay | Admitting: Gastroenterology

## 2011-07-16 ENCOUNTER — Ambulatory Visit (INDEPENDENT_AMBULATORY_CARE_PROVIDER_SITE_OTHER): Payer: Medicare Other | Admitting: Gastroenterology

## 2011-07-16 ENCOUNTER — Encounter: Payer: Self-pay | Admitting: Gastroenterology

## 2011-07-16 VITALS — BP 110/70 | HR 80 | Temp 97.5°F | Ht 62.0 in | Wt 177.8 lb

## 2011-07-16 DIAGNOSIS — Z860100 Personal history of colon polyps, unspecified: Secondary | ICD-10-CM

## 2011-07-16 DIAGNOSIS — Z9889 Other specified postprocedural states: Secondary | ICD-10-CM

## 2011-07-16 NOTE — Progress Notes (Signed)
Primary Care Physician:  Lupita Raider, MD Primary Gastroenterologist:  Dr. Darrick Penna  Chief Complaint  Patient presents with  . Colonoscopy    HPI:   [name] is a pleasant 70 year old female who presents today for a visit to discuss the need for updated colonoscopy. Her last procedure was in 2007, outlined under PMH. Polyps were hyperplastic. She denies any abdominal pain, change in bowel habits, nausea or vomiting. She has had no rectal bleeding. Her baseline BMs are twice per day. She denies any loss of appetite or wt loss. She denies any reflux. She has no concerns and is doing quite well today.   Past Medical History  Diagnosis Date  . Myocardial infarct 07/22/1996    st.elevated inferior wall infarct  . CAD (coronary artery disease)   . Hypertension   . Hypercholesterolemia   . S/P colonoscopy August 2007    hyperplastic polyps, rare sigmoid and descending colon diverticulosis, small internal hemorrhoids    Past Surgical History  Procedure Date  . Cardiac catheterization   . S/p hysterectomy     Current Outpatient Prescriptions  Medication Sig Dispense Refill  . amLODipine (NORVASC) 10 MG tablet Take 1 tablet (10 mg total) by mouth daily.  90 tablet  1  . aspirin 325 MG tablet Take 325 mg by mouth daily.        . Calcium Citrate (CITRACAL PO) Take 1 tablet by mouth daily.        . carvedilol (COREG) 6.25 MG tablet Take 1 tablet (6.25 mg total) by mouth 2 (two) times daily with a meal.  60 tablet  6  . Cholecalciferol (VITAMIN D3) 2000 UNITS capsule Take 2,000 Units by mouth daily.        . Omega-3 Fatty Acids (FISH OIL) 1000 MG CAPS Take 1 capsule by mouth daily.        . simvastatin (ZOCOR) 40 MG tablet Take 1 tablet (40 mg total) by mouth at bedtime.  90 tablet  1  . amLODipine (NORVASC) 10 MG tablet Take 1 tablet (10 mg total) by mouth daily.  30 tablet  1    Allergies as of 07/16/2011 - Review Complete 07/16/2011  Allergen Reaction Noted  . Sulfa antibiotics Rash  07/16/2011    Family History  Problem Relation Age of Onset  . Colon cancer Neg Hx     History   Social History  . Marital Status: Single    Spouse Name: N/A    Number of Children: 1  . Years of Education: N/A   Occupational History  . retired     Clinical biochemist rep   Social History Main Topics  . Smoking status: Current Everyday Smoker -- 1.0 packs/day for 50 years  . Smokeless tobacco: Never Used  . Alcohol Use: No  . Drug Use: No  . Sexually Active: Not on file   Other Topics Concern  . Not on file   Social History Narrative  . No narrative on file    Review of Systems: Gen: Denies any fever, chills, fatigue, weight loss, lack of appetite.  CV: Denies chest pain, heart palpitations, peripheral edema, syncope.  Resp: Denies shortness of breath at rest or with exertion. Denies wheezing or cough.  GI: Denies dysphagia or odynophagia. Denies jaundice, hematemesis, fecal incontinence. GU : Denies urinary burning, urinary frequency, urinary hesitancy MS: Denies joint pain, muscle weakness, cramps, or limitation of movement.  Derm: Denies rash, itching, dry skin Psych: Denies depression, anxiety, memory loss, and confusion Heme: Denies  bruising, bleeding, and enlarged lymph nodes.  Physical Exam: BP 110/70  Pulse 80  Temp(Src) 97.5 F (36.4 C) (Temporal)  Ht 5\' 2"  (1.575 m)  Wt 177 lb 12.8 oz (80.65 kg)  BMI 32.52 kg/m2 General:   Alert and oriented. Pleasant and cooperative. Well-nourished and well-developed.  Head:  Normocephalic and atraumatic. Eyes:  Without icterus, sclera clear and conjunctiva pink.  Ears:  Normal auditory acuity. Nose:  No deformity, discharge,  or lesions. Mouth:  No deformity or lesions, oral mucosa pink.  Neck:  Supple, without mass or thyromegaly. Lungs:  Clear to auscultation bilaterally. No wheezes, rales, or rhonchi. No distress.  Heart:  S1, S2 present without murmurs appreciated.  Abdomen:  +BS, soft, non-tender and  non-distended. No HSM noted. No guarding or rebound. No masses appreciated.  Rectal:  Deferred  Msk:  Symmetrical without gross deformities. Normal posture. Extremities:  Without clubbing or edema. Neurologic:  Alert and  oriented x4;  grossly normal neurologically. Skin:  Intact without significant lesions or rashes. Cervical Nodes:  No significant cervical adenopathy. Psych:  Alert and cooperative. Normal mood and affect.

## 2011-07-16 NOTE — Patient Instructions (Signed)
We will hold off on a colonoscopy for now.   Please call us if you have any abdominal pain or rectal bleeding.   We will be sending you a letter when it is time to return.

## 2011-07-17 ENCOUNTER — Encounter: Payer: Self-pay | Admitting: Gastroenterology

## 2011-07-17 DIAGNOSIS — Z86010 Personal history of colonic polyps: Secondary | ICD-10-CM | POA: Insufficient documentation

## 2011-07-17 NOTE — Progress Notes (Signed)
Cc to PCP 

## 2011-07-17 NOTE — Assessment & Plan Note (Signed)
70 year old female with history of hyperplastic polyps on colonoscopy in 2007. No hx of adenomatous polyps. She presents today devoid of any lower GI symptoms. She is doing well without any change in bowel habits or evidence of melena or hematochezia. We discussed the need for updated colonoscopy, and it would be appropriate to wait until 2017 for the next colonoscopy. She is agreeable to this and understands she needs to call our office with any changes in the interim. We discussed the signs and symptoms to watch for. We will see her back as needed, and we will plan for a colonoscopy in 5 years.

## 2011-07-18 NOTE — Progress Notes (Signed)
REMINDER PLACED IN EPIC FOR 5 YEAR REPEAT TCS

## 2011-07-23 ENCOUNTER — Other Ambulatory Visit: Payer: Self-pay | Admitting: *Deleted

## 2011-07-28 NOTE — Progress Notes (Signed)
REVIEWED. AGREE. 

## 2011-09-16 ENCOUNTER — Encounter: Payer: Self-pay | Admitting: Cardiology

## 2011-09-16 ENCOUNTER — Ambulatory Visit (INDEPENDENT_AMBULATORY_CARE_PROVIDER_SITE_OTHER): Payer: Medicare Other | Admitting: Cardiology

## 2011-09-16 VITALS — BP 148/74 | HR 77 | Resp 18 | Ht 62.0 in | Wt 177.0 lb

## 2011-09-16 DIAGNOSIS — E785 Hyperlipidemia, unspecified: Secondary | ICD-10-CM

## 2011-09-16 DIAGNOSIS — I2510 Atherosclerotic heart disease of native coronary artery without angina pectoris: Secondary | ICD-10-CM

## 2011-09-16 DIAGNOSIS — I252 Old myocardial infarction: Secondary | ICD-10-CM

## 2011-09-16 DIAGNOSIS — F17200 Nicotine dependence, unspecified, uncomplicated: Secondary | ICD-10-CM

## 2011-09-16 MED ORDER — CARVEDILOL 6.25 MG PO TABS: 6.2500 mg | ORAL_TABLET | Freq: Two times a day (BID) | ORAL | Status: DC

## 2011-09-16 MED ORDER — AMLODIPINE BESYLATE 10 MG PO TABS: 10.0000 mg | ORAL_TABLET | Freq: Every day | ORAL | Status: DC

## 2011-09-16 MED ORDER — SIMVASTATIN 40 MG PO TABS: 40.0000 mg | ORAL_TABLET | Freq: Every day | ORAL | Status: DC

## 2011-09-16 NOTE — Assessment & Plan Note (Signed)
Stable. No change in medications. 

## 2011-09-16 NOTE — Assessment & Plan Note (Signed)
Advised to quit. As mentioned above, she has no intention of stopping. Risk of lung disease and vascular disease emphasized

## 2011-09-16 NOTE — Progress Notes (Signed)
HPI Nicole Clements returns today for evaluation and management of her coronary artery disease and history of myocardial infarction.  Other than some mild dyspnea on exertion, which is not changed over the past year, she denies any cardiac symptoms. Specifically, she said no chest discomfort or angina. He  He continues to smoke about a pack of cigarettes a day. He smoked for 50 years. She has no intention of stopping. I like her honesty.  Her cholesterol panel was racing checked in May and was at goal except for a HDL of 37.  She is compliant. Her blood pressure averages 120-130/70-75 at home.  Past Medical History  Diagnosis Date  . Myocardial infarct 07/22/1996    st.elevated inferior [name] infarct  . CAD (coronary artery disease)   . Hypertension   . Hypercholesterolemia   . S/P colonoscopy August 2007    hyperplastic polyps, rare sigmoid and descending colon diverticulosis, small internal hemorrhoids    Current Outpatient Prescriptions  Medication Sig Dispense Refill  . amLODipine (NORVASC) 10 MG tablet Take 1 tablet (10 mg total) by mouth daily.  90 tablet  3  . aspirin 325 MG tablet Take 325 mg by mouth daily.        . Calcium Citrate (CITRACAL PO) Take 1 tablet by mouth daily.        . carvedilol (COREG) 6.25 MG tablet Take 1 tablet (6.25 mg total) by mouth 2 (two) times daily with a meal.  180 tablet  3  . Cholecalciferol (VITAMIN D3) 2000 UNITS capsule Take 2,000 Units by mouth daily.        . Omega-3 Fatty Acids (FISH OIL) 1000 MG CAPS Take 1 capsule by mouth daily.        . simvastatin (ZOCOR) 40 MG tablet Take 1 tablet (40 mg total) by mouth at bedtime.  90 tablet  3  . DISCONTD: amLODipine (NORVASC) 10 MG tablet Take 1 tablet (10 mg total) by mouth daily.  90 tablet  1  . DISCONTD: simvastatin (ZOCOR) 40 MG tablet Take 1 tablet (40 mg total) by mouth at bedtime.  90 tablet  1  . amLODipine (NORVASC) 10 MG tablet Take 1 tablet (10 mg total) by mouth daily.  30 tablet  1     Allergies  Allergen Reactions  . Sulfa Antibiotics Rash    Family History  Problem Relation Age of Onset  . Colon cancer Neg Hx     History   Social History  . Marital Status: Single    Spouse Name: N/A    Number of Children: 1  . Years of Education: N/A   Occupational History  . retired     Clinical biochemist rep   Social History Main Topics  . Smoking status: Current Everyday Smoker -- 1.0 packs/day for 50 years  . Smokeless tobacco: Never Used  . Alcohol Use: No  . Drug Use: No  . Sexually Active: Not on file   Other Topics Concern  . Not on file   Social History Narrative  . No narrative on file    ROS ALL NEGATIVE EXCEPT THOSE NOTED IN HPI  PE  General Appearance: well developed, well nourished in no acute distress HEENT: symmetrical face, PERRLA, good dentition  Neck: no JVD, thyromegaly, or adenopathy, trachea midline Chest: symmetric without deformity Cardiac: PMI non-displaced, RRR, normal S1, S2, no gallop or murmur Lung: clear to ausculation and percussion Vascular: all pulses full without bruits  Abdominal: nondistended, nontender, good bowel sounds, no HSM,  no bruits Extremities: no cyanosis, clubbing or edema, no sign of DVT, no varicosities  Skin: normal color, no rashes Neuro: alert and oriented x 3, non-focal Pysch: normal affect  EKG EKG was not repeated today. BMET    Component Value Date/Time   NA 142 01/17/2010 1837   K 4.2 01/17/2010 1837   CL 107 01/17/2010 1837   CO2 22 01/17/2010 1837   GLUCOSE 96 01/17/2010 1837   BUN 8 01/17/2010 1837   CREATININE 0.69 01/17/2010 1837   CALCIUM 9.0 01/17/2010 1837    Lipid Panel     Component Value Date/Time   CHOL 136 03/12/2011 1055   TRIG 100 03/12/2011 1055   HDL 37* 03/12/2011 1055   CHOLHDL 3.7 03/12/2011 1055   VLDL 20 03/12/2011 1055   LDLCALC 79 03/12/2011 1055    CBC No results found for this basename: wbc, rbc, hgb, hct, plt, mcv, mch, mchc, rdw, neutrabs, lymphsabs, monoabs,  eosabs, basosabs

## 2011-09-16 NOTE — Patient Instructions (Signed)
Your physician recommends that you continue on your current medications as directed. Please refer to the Current Medication list given to you today.  Your physician recommends that you schedule a follow-up appointment in: 1 year  

## 2011-09-16 NOTE — Assessment & Plan Note (Signed)
At goal except her HDL. No change in meds.

## 2012-02-25 DIAGNOSIS — S92919A Unspecified fracture of unspecified toe(s), initial encounter for closed fracture: Secondary | ICD-10-CM | POA: Diagnosis not present

## 2012-03-18 DIAGNOSIS — S92919A Unspecified fracture of unspecified toe(s), initial encounter for closed fracture: Secondary | ICD-10-CM | POA: Diagnosis not present

## 2012-04-14 ENCOUNTER — Other Ambulatory Visit (HOSPITAL_COMMUNITY): Payer: Self-pay | Admitting: Gynecology

## 2012-04-14 DIAGNOSIS — Z139 Encounter for screening, unspecified: Secondary | ICD-10-CM

## 2012-04-22 ENCOUNTER — Ambulatory Visit (HOSPITAL_COMMUNITY): Payer: Medicare Other

## 2012-04-27 ENCOUNTER — Ambulatory Visit (HOSPITAL_COMMUNITY)
Admission: RE | Admit: 2012-04-27 | Discharge: 2012-04-27 | Disposition: A | Discharge status: Home / Self Care | Payer: Medicare Other | Source: Ambulatory Visit | Attending: Gynecology | Admitting: Gynecology

## 2012-04-27 DIAGNOSIS — Z1231 Encounter for screening mammogram for malignant neoplasm of breast: Secondary | ICD-10-CM | POA: Insufficient documentation

## 2012-04-27 DIAGNOSIS — Z139 Encounter for screening, unspecified: Secondary | ICD-10-CM

## 2012-08-04 DIAGNOSIS — Z23 Encounter for immunization: Secondary | ICD-10-CM | POA: Diagnosis not present

## 2012-09-27 ENCOUNTER — Other Ambulatory Visit: Payer: Self-pay | Admitting: *Deleted

## 2012-09-27 MED ORDER — CARVEDILOL 6.25 MG PO TABS: 6.2500 mg | ORAL_TABLET | Freq: Two times a day (BID) | ORAL | Status: DC

## 2012-10-06 ENCOUNTER — Other Ambulatory Visit: Payer: Self-pay | Admitting: Cardiology

## 2012-10-06 MED ORDER — SIMVASTATIN 40 MG PO TABS: 40.0000 mg | ORAL_TABLET | Freq: Every day | ORAL | Status: DC

## 2012-10-06 MED ORDER — AMLODIPINE BESYLATE 10 MG PO TABS: 10.0000 mg | ORAL_TABLET | Freq: Every day | ORAL | Status: DC

## 2012-10-06 NOTE — Telephone Encounter (Signed)
Patient came into office and asked about refills.  She is needing her Amlodipine and simvastatin.  Per [facility] these were faxed to [place].

## 2012-11-22 ENCOUNTER — Ambulatory Visit (INDEPENDENT_AMBULATORY_CARE_PROVIDER_SITE_OTHER): Payer: Medicare Other | Admitting: Cardiology

## 2012-11-22 ENCOUNTER — Encounter: Payer: Self-pay | Admitting: Cardiology

## 2012-11-22 VITALS — BP 120/56 | HR 69 | Ht 63.0 in | Wt 175.1 lb

## 2012-11-22 DIAGNOSIS — F17200 Nicotine dependence, unspecified, uncomplicated: Secondary | ICD-10-CM | POA: Diagnosis not present

## 2012-11-22 DIAGNOSIS — I252 Old myocardial infarction: Secondary | ICD-10-CM | POA: Diagnosis not present

## 2012-11-22 DIAGNOSIS — I2510 Atherosclerotic heart disease of native coronary artery without angina pectoris: Secondary | ICD-10-CM

## 2012-11-22 DIAGNOSIS — E785 Hyperlipidemia, unspecified: Secondary | ICD-10-CM

## 2012-11-22 DIAGNOSIS — I509 Heart failure, unspecified: Secondary | ICD-10-CM

## 2012-11-22 DIAGNOSIS — I10 Essential (primary) hypertension: Secondary | ICD-10-CM

## 2012-11-22 DIAGNOSIS — I5042 Chronic combined systolic (congestive) and diastolic (congestive) heart failure: Secondary | ICD-10-CM

## 2012-11-22 MED ORDER — LOSARTAN POTASSIUM 100 MG PO TABS: 100.0000 mg | ORAL_TABLET | Freq: Every day | ORAL | Status: DC

## 2012-11-22 MED ORDER — CARVEDILOL 12.5 MG PO TABS: 12.5000 mg | ORAL_TABLET | Freq: Two times a day (BID) | ORAL | Status: DC

## 2012-11-22 NOTE — Progress Notes (Signed)
HPI [name] returns today for her history of coronary artery disease, history of inferior Kerrigan [place] MI, mild left ventricular systolic dysfunction EF 45-50%, hypertension, hyperlipidemia, and tobacco use. She denies any symptoms of angina or chest discomfort. She has had some lower extremity edema. She's on 10 mg of amlodipine. Also note she's on simvastatin 40 mg with amlodipine.  She denies orthopnea, PND, palpitations. She is overdue blood work. She still smokes.  Past Medical History  Diagnosis Date  . Myocardial infarct 07/22/1996    st.elevated inferior [place] infarct  . CAD (coronary artery disease)   . Hypertension   . Hypercholesterolemia   . S/P colonoscopy August 2007    hyperplastic polyps, rare sigmoid and descending colon diverticulosis, small internal hemorrhoids    Current Outpatient Prescriptions  Medication Sig Dispense Refill  . amLODipine (NORVASC) 10 MG tablet Take 1 tablet (10 mg total) by mouth daily.  90 tablet  3  . amLODipine (NORVASC) 10 MG tablet Take 10 mg by mouth daily.      [name] aspirin 325 MG tablet Take 325 mg by mouth daily.        . Calcium Citrate (CITRACAL PO) Take 1 tablet by mouth daily.        . carvedilol (COREG) 6.25 MG tablet Take 1 tablet (6.25 mg total) by mouth 2 (two) times daily with a meal.  180 tablet  0  . Cholecalciferol (VITAMIN D3) 2000 UNITS capsule Take 2,000 Units by mouth daily.        . Omega-3 Fatty Acids (FISH OIL) 1000 MG CAPS Take 1 capsule by mouth daily.        . simvastatin (ZOCOR) 40 MG tablet Take 1 tablet (40 mg total) by mouth at bedtime.  90 tablet  3    Allergies  Allergen Reactions  . Sulfa Antibiotics Rash    Family History  Problem Relation Age of Onset  . Colon cancer Neg Hx     History   Social History  . Marital Status: Single    Spouse Name: N/A    Number of Children: 1  . Years of Education: N/A   Occupational History  . retired     Clinical biochemist rep   Social History Main Topics  . Smoking  status: Current Every Day Smoker -- 1.0 packs/day for 50 years  . Smokeless tobacco: Never Used  . Alcohol Use: No  . Drug Use: No  . Sexually Active: Not on file   Other Topics Concern  . Not on file   Social History Narrative  . No narrative on file    ROS ALL NEGATIVE EXCEPT THOSE NOTED IN HPI  PE  General Appearance: well developed, well nourished in no acute distress HEENT: symmetrical face, PERRLA, good dentition  Neck: no JVD, thyromegaly, or adenopathy, trachea midline Chest: symmetric without deformity Cardiac: PMI non-displaced, RRR, normal S1, S2, no gallop or murmur Lung: clear to ausculation and percussion Vascular: all pulses full without bruits  Abdominal: nondistended, nontender, good bowel sounds, no HSM, no bruits Extremities: no cyanosis, clubbing or edema, no sign of DVT, no varicosities  Skin: normal color, no rashes Neuro: alert and oriented x 3, non-focal Pysch: normal affect  EKG Normal sinus rhythm, ST segment depression with T wave inversion in the lateral leads, unifocal PVCs. Previous nonspecific intraventricular conduction delay resolve. BMET    Component Value Date/Time   NA 142 01/17/2010 1837   K 4.2 01/17/2010 1837   CL 107 01/17/2010 1837  CO2 22 01/17/2010 1837   GLUCOSE 96 01/17/2010 1837   BUN 8 01/17/2010 1837   CREATININE 0.69 01/17/2010 1837   CALCIUM 9.0 01/17/2010 1837    Lipid Panel     Component Value Date/Time   CHOL 136 03/12/2011 1055   TRIG 100 03/12/2011 1055   HDL 37* 03/12/2011 1055   CHOLHDL 3.7 03/12/2011 1055   VLDL 20 03/12/2011 1055   LDLCALC 79 03/12/2011 1055    CBC No results found for this basename: wbc, rbc, hgb, hct, plt, mcv, mch, mchc, rdw, neutrabs, lymphsabs, monoabs, eosabs, basosabs

## 2012-11-22 NOTE — Assessment & Plan Note (Signed)
Stable clinically. She needs a repeat echocardiogram to reassess LV function. Her blood pressure control. We'll change her to losartan 100 mg a day and increase her carvedilol to 12-1/2 mg twice a day. I'll discontinue the amlodipine. Her last EF was 45-50% but maybe less.

## 2012-11-22 NOTE — Assessment & Plan Note (Signed)
And kidney secondary preventative therapy as outlined. Return the office in one year.

## 2012-11-22 NOTE — Patient Instructions (Addendum)
Your physician recommends that you schedule a follow-up appointment in: 10DAYS FOR A BLOOD PRESSURE CHECK WITH THE NURSE  Your physician recommends that you return for lab work in: 10DAYS WITH FASTING LIPIDS, CMP (SLIPS GIVEN)   Your physician has recommended you make the following change in your medication:   1) STOP AMLODIPINE  2) START LOSARTAN 100MG  EVERY DAY 3) INCREASE  COREG TO 12.5MG  TWICE DAILY

## 2012-11-22 NOTE — Assessment & Plan Note (Signed)
She is on too high dose of simvastatin being on amlodipine. Says were discontinue them amlodipine, we'll not change her. She is overdue labs and will arrange for her to have fasting comprehensive metabolic profile and lipids.

## 2012-11-22 NOTE — Addendum Note (Signed)
Addended by: Derry Lory A on: 11/22/2012 11:34 AM   Modules accepted: Orders

## 2012-11-22 NOTE — Assessment & Plan Note (Signed)
Encouraged to stop. 

## 2012-12-03 ENCOUNTER — Ambulatory Visit (INDEPENDENT_AMBULATORY_CARE_PROVIDER_SITE_OTHER): Payer: Medicare Other | Admitting: *Deleted

## 2012-12-03 VITALS — BP 142/66 | HR 73 | Ht 63.0 in | Wt 178.5 lb

## 2012-12-03 DIAGNOSIS — I10 Essential (primary) hypertension: Secondary | ICD-10-CM | POA: Diagnosis not present

## 2012-12-03 DIAGNOSIS — F17200 Nicotine dependence, unspecified, uncomplicated: Secondary | ICD-10-CM | POA: Diagnosis not present

## 2012-12-03 DIAGNOSIS — E785 Hyperlipidemia, unspecified: Secondary | ICD-10-CM | POA: Diagnosis not present

## 2012-12-03 DIAGNOSIS — I5042 Chronic combined systolic (congestive) and diastolic (congestive) heart failure: Secondary | ICD-10-CM | POA: Diagnosis not present

## 2012-12-03 DIAGNOSIS — I2510 Atherosclerotic heart disease of native coronary artery without angina pectoris: Secondary | ICD-10-CM | POA: Diagnosis not present

## 2012-12-03 DIAGNOSIS — I252 Old myocardial infarction: Secondary | ICD-10-CM | POA: Diagnosis not present

## 2012-12-03 DIAGNOSIS — I509 Heart failure, unspecified: Secondary | ICD-10-CM | POA: Diagnosis not present

## 2012-12-03 LAB — COMPREHENSIVE METABOLIC PANEL
ALT: 8 U/L (ref 0–35)
Albumin: 4 g/dL (ref 3.5–5.2)
CO2: 26 mEq/L (ref 19–32)
Chloride: 109 mEq/L (ref 96–112)
Potassium: 3.6 mEq/L (ref 3.5–5.3)
Sodium: 145 mEq/L (ref 135–145)
Total Bilirubin: 1.1 mg/dL (ref 0.3–1.2)
Total Protein: 6.4 g/dL (ref 6.0–8.3)

## 2012-12-03 LAB — LIPID PANEL
HDL: 34 mg/dL — ABNORMAL LOW (ref >39)
LDL Cholesterol: 75 mg/dL (ref 0–99)

## 2012-12-03 NOTE — Patient Instructions (Addendum)
Your physician recommends that you schedule a follow-up appointment in: WE WILL CONTACT YOU WITH THE NEXT STEPS  

## 2012-12-03 NOTE — Progress Notes (Signed)
Pt presented to the NV today to re-check BP per recent medication changes noted at last OV 11-22-12 Pt advised she feels good today with no complaints Pt verified she is taking the medications as directed at last OV, pt was advised that we will contact her with the next steps after MD Wall reviews the NV/VS from today pt understood  Last OV VS/D/C [place] on 11-22-12  120/56 69  Your physician recommends that you schedule a follow-up appointment in: 10DAYS FOR A BLOOD PRESSURE CHECK WITH THE NURSE  Your physician recommends that you return for lab work in: 10DAYS WITH FASTING LIPIDS, CMP (SLIPS GIVEN)  Your physician has recommended you make the following change in your medication:  1) STOP AMLODIPINE  2) START LOSARTAN 100MG  EVERY DAY  3) INCREASE COREG TO 12.5MG  TWICE DAILY

## 2012-12-07 ENCOUNTER — Other Ambulatory Visit: Payer: Self-pay | Admitting: *Deleted

## 2012-12-07 ENCOUNTER — Encounter: Payer: Self-pay | Admitting: *Deleted

## 2012-12-07 DIAGNOSIS — E782 Mixed hyperlipidemia: Secondary | ICD-10-CM

## 2012-12-31 ENCOUNTER — Encounter (HOSPITAL_COMMUNITY): Payer: Self-pay | Admitting: Emergency Medicine

## 2012-12-31 ENCOUNTER — Emergency Department (HOSPITAL_COMMUNITY): Payer: Medicare Other

## 2012-12-31 ENCOUNTER — Inpatient Hospital Stay (HOSPITAL_COMMUNITY)
Admission: EM | Admit: 2012-12-31 | Discharge: 2013-01-03 | DRG: 291 | Disposition: A | Discharge status: Home / Self Care | Payer: Medicare Other | Attending: Internal Medicine | Admitting: Internal Medicine

## 2012-12-31 DIAGNOSIS — J810 Acute pulmonary edema: Secondary | ICD-10-CM

## 2012-12-31 DIAGNOSIS — R918 Other nonspecific abnormal finding of lung field: Secondary | ICD-10-CM | POA: Diagnosis not present

## 2012-12-31 DIAGNOSIS — I509 Heart failure, unspecified: Secondary | ICD-10-CM | POA: Diagnosis present

## 2012-12-31 DIAGNOSIS — J9600 Acute respiratory failure, unspecified whether with hypoxia or hypercapnia: Secondary | ICD-10-CM | POA: Diagnosis present

## 2012-12-31 DIAGNOSIS — I10 Essential (primary) hypertension: Secondary | ICD-10-CM | POA: Diagnosis present

## 2012-12-31 DIAGNOSIS — I517 Cardiomegaly: Secondary | ICD-10-CM | POA: Diagnosis not present

## 2012-12-31 DIAGNOSIS — R0602 Shortness of breath: Secondary | ICD-10-CM | POA: Diagnosis not present

## 2012-12-31 DIAGNOSIS — R000 Tachycardia, unspecified: Secondary | ICD-10-CM | POA: Diagnosis not present

## 2012-12-31 DIAGNOSIS — F17200 Nicotine dependence, unspecified, uncomplicated: Secondary | ICD-10-CM

## 2012-12-31 DIAGNOSIS — J811 Chronic pulmonary edema: Secondary | ICD-10-CM

## 2012-12-31 DIAGNOSIS — D7289 Other specified disorders of white blood cells: Secondary | ICD-10-CM | POA: Diagnosis not present

## 2012-12-31 DIAGNOSIS — I252 Old myocardial infarction: Secondary | ICD-10-CM | POA: Diagnosis not present

## 2012-12-31 DIAGNOSIS — I2510 Atherosclerotic heart disease of native coronary artery without angina pectoris: Secondary | ICD-10-CM | POA: Diagnosis not present

## 2012-12-31 DIAGNOSIS — E7800 Pure hypercholesterolemia, unspecified: Secondary | ICD-10-CM | POA: Diagnosis present

## 2012-12-31 DIAGNOSIS — E785 Hyperlipidemia, unspecified: Secondary | ICD-10-CM

## 2012-12-31 DIAGNOSIS — D72829 Elevated white blood cell count, unspecified: Secondary | ICD-10-CM | POA: Diagnosis present

## 2012-12-31 DIAGNOSIS — Z79899 Other long term (current) drug therapy: Secondary | ICD-10-CM | POA: Diagnosis not present

## 2012-12-31 DIAGNOSIS — I5043 Acute on chronic combined systolic (congestive) and diastolic (congestive) heart failure: Principal | ICD-10-CM

## 2012-12-31 DIAGNOSIS — J9601 Acute respiratory failure with hypoxia: Secondary | ICD-10-CM | POA: Diagnosis present

## 2012-12-31 DIAGNOSIS — R0609 Other forms of dyspnea: Secondary | ICD-10-CM | POA: Diagnosis not present

## 2012-12-31 HISTORY — DX: Heart failure, unspecified: I50.9

## 2012-12-31 LAB — COMPREHENSIVE METABOLIC PANEL
Albumin: 3.6 g/dL (ref 3.5–5.2)
Alkaline Phosphatase: 101 U/L (ref 39–117)
BUN: 7 mg/dL (ref 6–23)
CO2: 24 mEq/L (ref 19–32)
Chloride: 100 mEq/L (ref 96–112)
Creatinine, Ser: 0.97 mg/dL (ref 0.50–1.10)
GFR calc Af Amer: 68 mL/min — ABNORMAL LOW (ref >90)
GFR calc non Af Amer: 58 mL/min — ABNORMAL LOW (ref >90)
Glucose, Bld: 221 mg/dL — ABNORMAL HIGH (ref 70–99)
Potassium: 3.6 mEq/L (ref 3.5–5.1)
Total Bilirubin: 1.3 mg/dL — ABNORMAL HIGH (ref 0.3–1.2)

## 2012-12-31 LAB — CBC WITH DIFFERENTIAL/PLATELET
Basophils Relative: 0 % (ref 0–1)
Eosinophils Absolute: 0.3 10*3/uL (ref 0.0–0.7)
Eosinophils Relative: 2 % (ref 0–5)
Hemoglobin: 15.3 g/dL — ABNORMAL HIGH (ref 12.0–15.0)
Lymphs Abs: 7.7 10*3/uL — ABNORMAL HIGH (ref 0.7–4.0)
MCH: 28.9 pg (ref 26.0–34.0)
MCHC: 31.3 g/dL (ref 30.0–36.0)
MCV: 92.4 fL (ref 78.0–100.0)
Monocytes Absolute: 1 10*3/uL (ref 0.1–1.0)
Neutro Abs: 4.6 10*3/uL (ref 1.7–7.7)
Neutrophils Relative %: 34 % — ABNORMAL LOW (ref 43–77)
RBC: 5.29 MIL/uL — ABNORMAL HIGH (ref 3.87–5.11)
Smear Review: ADEQUATE

## 2012-12-31 LAB — URINALYSIS, MICROSCOPIC ONLY
Ketones, ur: NEGATIVE mg/dL
Leukocytes, UA: NEGATIVE
Nitrite: NEGATIVE
Specific Gravity, Urine: 1.015 (ref 1.005–1.030)
Urobilinogen, UA: 0.2 mg/dL (ref 0.0–1.0)
pH: 6.5 (ref 5.0–8.0)

## 2012-12-31 LAB — TROPONIN I: Troponin I: <0.3 ng/mL (ref <0.30)

## 2012-12-31 MED ORDER — FUROSEMIDE 10 MG/ML IJ SOLN
40.0000 mg | Freq: Two times a day (BID) | INTRAMUSCULAR | Status: DC
  Administered 2012-12-31 – 2013-01-03 (×6): 40 mg via INTRAVENOUS
  Filled 2012-12-31 (×7): qty 4

## 2012-12-31 MED ORDER — NITROGLYCERIN IN D5W 200-5 MCG/ML-% IV SOLN
5.0000 ug/min | Freq: Once | INTRAVENOUS | Status: AC
  Administered 2012-12-31: 10 ug/min via INTRAVENOUS

## 2012-12-31 MED ORDER — SIMVASTATIN 20 MG PO TABS
40.0000 mg | ORAL_TABLET | Freq: Every day | ORAL | Status: DC
  Administered 2012-12-31 – 2013-01-02 (×3): 40 mg via ORAL
  Filled 2012-12-31 (×3): qty 2

## 2012-12-31 MED ORDER — SODIUM CHLORIDE 0.9 % IJ SOLN
3.0000 mL | INTRAMUSCULAR | Status: DC | PRN
  Administered 2012-12-31: 3 mL via INTRAVENOUS

## 2012-12-31 MED ORDER — ONDANSETRON HCL 4 MG/2ML IJ SOLN: 4.0000 mg | Freq: Four times a day (QID) | INTRAMUSCULAR | Status: DC | PRN

## 2012-12-31 MED ORDER — CARVEDILOL 12.5 MG PO TABS
12.5000 mg | ORAL_TABLET | Freq: Two times a day (BID) | ORAL | Status: DC
  Administered 2012-12-31 – 2013-01-02 (×5): 12.5 mg via ORAL
  Filled 2012-12-31 (×5): qty 1

## 2012-12-31 MED ORDER — ASPIRIN 325 MG PO TABS
325.0000 mg | ORAL_TABLET | Freq: Every day | ORAL | Status: DC
  Administered 2012-12-31 – 2013-01-03 (×4): 325 mg via ORAL
  Filled 2012-12-31 (×4): qty 1

## 2012-12-31 MED ORDER — LOSARTAN POTASSIUM 50 MG PO TABS
100.0000 mg | ORAL_TABLET | Freq: Every day | ORAL | Status: DC
  Administered 2013-01-01: 50 mg via ORAL
  Administered 2013-01-01 – 2013-01-03 (×3): 100 mg via ORAL
  Filled 2012-12-31 (×3): qty 2
  Filled 2012-12-31: qty 1
  Filled 2012-12-31: qty 2

## 2012-12-31 MED ORDER — SODIUM CHLORIDE 0.9 % IJ SOLN
3.0000 mL | Freq: Two times a day (BID) | INTRAMUSCULAR | Status: DC
  Administered 2012-12-31 – 2013-01-01 (×2): 3 mL via INTRAVENOUS
  Administered 2013-01-01: 11:00:00 via INTRAVENOUS
  Administered 2013-01-02 (×2): 3 mL via INTRAVENOUS
  Filled 2012-12-31 (×2): qty 3

## 2012-12-31 MED ORDER — ACETAMINOPHEN 325 MG PO TABS: 650.0000 mg | ORAL_TABLET | ORAL | Status: DC | PRN

## 2012-12-31 MED ORDER — POTASSIUM CHLORIDE CRYS ER 20 MEQ PO TBCR
40.0000 meq | EXTENDED_RELEASE_TABLET | Freq: Two times a day (BID) | ORAL | Status: DC
  Administered 2012-12-31 – 2013-01-03 (×6): 40 meq via ORAL
  Filled 2012-12-31 (×6): qty 2

## 2012-12-31 MED ORDER — NITROGLYCERIN 2 % TD OINT
1.0000 [in_us] | TOPICAL_OINTMENT | Freq: Four times a day (QID) | TRANSDERMAL | Status: DC
  Administered 2012-12-31 – 2013-01-03 (×11): 1 [in_us] via TOPICAL
  Filled 2012-12-31 (×9): qty 1

## 2012-12-31 MED ORDER — SODIUM CHLORIDE 0.9 % IV SOLN: 250.0000 mL | INTRAVENOUS | Status: DC | PRN

## 2012-12-31 MED ORDER — FUROSEMIDE 10 MG/ML IJ SOLN: 80.0000 mg | Freq: Once | INTRAMUSCULAR | Status: DC

## 2012-12-31 MED ORDER — FUROSEMIDE 10 MG/ML IJ SOLN
80.0000 mg | Freq: Once | INTRAMUSCULAR | Status: AC
  Administered 2012-12-31: 80 mg via INTRAVENOUS

## 2012-12-31 MED ORDER — ENOXAPARIN SODIUM 40 MG/0.4ML ~~LOC~~ SOLN
40.0000 mg | SUBCUTANEOUS | Status: DC
  Administered 2012-12-31 – 2013-01-02 (×3): 40 mg via SUBCUTANEOUS
  Filled 2012-12-31 (×3): qty 0.4

## 2012-12-31 NOTE — ED Notes (Signed)
Nitro rate decreased to 1.5/hr

## 2012-12-31 NOTE — ED Notes (Signed)
Patient arrives via EMS from home with c/o increasing Respiratory distress over last several hours. Decreased lung sounds, oxygen saturations 74% on NRB at 15 liters. Patient given two albuterol, 125 mg solu medrol given IV in route. Patient ashen in color. Moaning.

## 2012-12-31 NOTE — ED Provider Notes (Addendum)
History     CSN: [id number]  Arrival date & time 12/31/12  1533   First MD Initiated Contact with Patient 12/31/12 1553      Chief Complaint  Patient presents with  . Respiratory Distress    (Consider location/radiation/quality/duration/timing/severity/associated sxs/prior treatment) HPI...level V caveat for urgent need for intervention.   Patient was found in her motor vehicle in severe respiratory distress. Other history unable to be obtained at initial evaluation.  Nursing triage note reviewed.  Past Medical History  Diagnosis Date  . Myocardial infarct 07/22/1996    st.elevated inferior wall infarct  . CAD (coronary artery disease)   . Hypertension   . Hypercholesterolemia   . S/P colonoscopy August 2007    hyperplastic polyps, rare sigmoid and descending colon diverticulosis, small internal hemorrhoids    Past Surgical History  Procedure Laterality Date  . Cardiac catheterization    . S/p hysterectomy      Family History  Problem Relation Age of Onset  . Colon cancer Neg Hx     History  Substance Use Topics  . Smoking status: Current Every Day Smoker -- 1.00 packs/day for 50 years  . Smokeless tobacco: Never Used  . Alcohol Use: No    OB History   Grav Para Term Preterm Abortions TAB SAB Ect Mult Living                  Review of Systems  Unable to perform ROS   Allergies  Sulfa antibiotics  Home Medications   Current Outpatient Rx  Name  Route  Sig  Dispense  Refill  . aspirin 325 MG tablet   Oral   Take 325 mg by mouth daily.           . Calcium Citrate (CITRACAL PO)   Oral   Take 1 tablet by mouth daily.           . carvedilol (COREG) 12.5 MG tablet   Oral   Take 1 tablet (12.5 mg total) by mouth 2 (two) times daily.   60 tablet   5   . Cholecalciferol (VITAMIN D3) 2000 UNITS capsule   Oral   Take 2,000 Units by mouth daily.           [name] losartan (COZAAR) 100 MG tablet   Oral   Take 1 tablet (100 mg total) by mouth  daily.   30 tablet   5   . Omega-3 Fatty Acids (FISH OIL) 1000 MG CAPS   Oral   Take 1 capsule by mouth daily.           . simvastatin (ZOCOR) 40 MG tablet   Oral   Take 1 tablet (40 mg total) by mouth at bedtime.   90 tablet   3     BP 115/58  Pulse 81  Resp 25  SpO2 93%  Physical Exam  Nursing note and vitals reviewed. Constitutional:  Severe distress  HENT:  Head: Normocephalic and atraumatic.  Eyes: Conjunctivae are normal. Pupils are equal, round, and reactive to light.  Neck: Normal range of motion. Neck supple.  Cardiovascular: Normal rate and normal heart sounds.   tachycardic  Pulmonary/Chest:  Rales bilaterally  Abdominal: Soft. Bowel sounds are normal.  Musculoskeletal: Normal range of motion.  Neurological:  unable  Skin: Skin is warm and dry.  Psychiatric:  unable    ED Course  Procedures (including critical care time)  Labs Reviewed  URINALYSIS, MICROSCOPIC ONLY - Abnormal; Notable for the following:  Hgb urine dipstick TRACE (*)    Protein, ur 30 (*)    Casts HYALINE CASTS (*)    All other components within normal limits  CBC WITH DIFFERENTIAL - Abnormal; Notable for the following:    WBC 13.6 (*)    RBC 5.29 (*)    Hemoglobin 15.3 (*)    HCT 48.9 (*)    Neutrophils Relative 34 (*)    Lymphocytes Relative 57 (*)    Lymphs Abs 7.7 (*)    All other components within normal limits  PRO B NATRIURETIC PEPTIDE - Abnormal; Notable for the following:    Pro B Natriuretic peptide (BNP) 6101.0 (*)    All other components within normal limits  COMPREHENSIVE METABOLIC PANEL - Abnormal; Notable for the following:    Glucose, Bld 221 (*)    Total Bilirubin 1.3 (*)    GFR calc non Af Amer 58 (*)    GFR calc Af Amer 68 (*)    All other components within normal limits  TROPONIN I  PATHOLOGIST SMEAR REVIEW   Dg Chest Portable 1 View  12/31/2012  *RADIOLOGY REPORT*  Clinical Data: Respiratory distress  PORTABLE CHEST - 1 VIEW  Comparison:  06/01/2008  Findings: Cardiomegaly with moderate interstitial edema.  No definite pleural effusion.  No pneumothorax.  IMPRESSION: Cardiomegaly with moderate interstitial edema.   Original Report Authenticated By: Charline Bills, M.D.       Date: 0228//2014  Rate: 83  Rhythm: normal sinus rhythm  QRS Axis: normal  Intervals: normal  ST/T Wave abnormalities: normal  Conduction Disutrbances:nonspecific intraventricular conduction delay  Narrative Interpretation:   Old EKG Reviewed: changes noted PVC's   CRITICAL CARE Performed by: Donnetta Hutching  ?  Total critical care time:45  Critical care time was exclusive of separately billable procedures and treating other patients.  Critical care was necessary to treat or prevent imminent or life-threatening deterioration.  Critical care was time spent personally by me on the following activities: development of treatment plan with patient and/or surrogate as well as nursing, discussions with consultants, evaluation of patient's response to treatment, examination of patient, obtaining history from patient or surrogate, ordering and performing treatments and interventions, ordering and review of laboratory studies, ordering and review of radiographic studies, pulse oximetry and re-evaluation of patient's condition.  1. Pulmonary edema       MDM  My initial clinical suspicion was pulmonary edema.  Lasix 80 mg IV initiated along with a nitroglycerin drip at 15 mcg per minute. BiPAP also started. Patient's vital signs began to normalize.  She ultimately became more alert. Respirations improved. Good diuresis. Admit to step down        Donnetta Hutching, MD 12/31/12 1610  Donnetta Hutching, MD 01/01/13 [phone]

## 2012-12-31 NOTE — ED Notes (Signed)
Per Dr Kerry Hough, patient to have trial run off of the bipap. Patient informed, bipap removed with RT aware and patient placed on 3 L [place]. Oxygen saturations 90% at present. Will monitor closely.

## 2012-12-31 NOTE — H&P (Signed)
[facility] History and Physical  Nicole Clements:[id number] DOB: 24-Sep-1941 DOA: 12/31/2012  Referring physician: Dr. Adriana Simas PCP: Lupita Raider, MD  Specialists: Cardiologist: Dr. Juanito Doom  Chief Complaint: shortness of breath  HPI: Nicole Clements is a 72 y.o. female with history of chronic combined congestive heart failure who presents to the emergency room with shortness of breath. On arrival to the emergency room, patient was in respiratory distress and could not provide any history. She was on a nonrebreather and was hypoxic with oxygen saturations in the 70s. Suspicion was for congestive heart failure, so she received Lasix and was started on BiPAP. At the time of my evaluation, patient is breathing more comfortably and is able to participate in history. She reports that her shortness of breath has been occurring for approximately 2-3 weeks. This has progressively gotten worse. She denies any fever or significant cough. She has not had any chest pain. She does admit to experiencing orthopnea and paroxysmal nocturnal dyspnea. She felt her breathing significantly got worse today. She has not been prescribed a diuretic as an outpatient. She has not been checking her weight but feels that it has not changed. She admits to being noncompliant with her diet with regards to salt. She is currently on BiPAP and is clinically feeling much better. Admission has been requested for congestive heart failure exacerbation.  Review of Systems: pertinent positives as per HPI, otherwise negative  Past Medical History  Diagnosis Date  . Myocardial infarct 07/22/1996    st.elevated inferior wall infarct  . CAD (coronary artery disease)   . Hypertension   . Hypercholesterolemia   . S/P colonoscopy August 2007    hyperplastic polyps, rare sigmoid and descending colon diverticulosis, small internal hemorrhoids   Past Surgical History  Procedure Laterality Date  . Cardiac catheterization    . S/p  hysterectomy     Social History:  reports that she has been smoking.  She has never used smokeless tobacco. She reports that she does not drink alcohol or use illicit drugs. Lives at home  Allergies  Allergen Reactions  . Sulfa Antibiotics Rash    Family History  Problem Relation Age of Onset  . Colon cancer Neg Hx   brother had cardiac bypass surgery in his 33s.  Mother has also had cardiac bypass in her 86s  Prior to Admission medications   Medication Sig Start Date End Date Taking? Authorizing Provider  aspirin 325 MG tablet Take 325 mg by mouth daily.      Historical Provider, MD  Calcium Citrate (CITRACAL PO) Take 1 tablet by mouth daily.      Historical Provider, MD  carvedilol (COREG) 12.5 MG tablet Take 1 tablet (12.5 mg total) by mouth 2 (two) times daily. 11/22/12   Gaylord Shih, MD  Cholecalciferol (VITAMIN D3) 2000 UNITS capsule Take 2,000 Units by mouth daily.      Historical Provider, MD  losartan (COZAAR) 100 MG tablet Take 1 tablet (100 mg total) by mouth daily. 11/22/12   Gaylord Shih, MD  Omega-3 Fatty Acids (FISH OIL) 1000 MG CAPS Take 1 capsule by mouth daily.      Historical Provider, MD  simvastatin (ZOCOR) 40 MG tablet Take 1 tablet (40 mg total) by mouth at bedtime. 10/06/12   Gaylord Shih, MD   Physical Exam: Filed Vitals:   12/31/12 1547 12/31/12 1615 12/31/12 1630 12/31/12 1645  BP: 163/90 130/78 117/67 115/58  Pulse: 113 88 78 81  Resp: 35 21  19 25  SpO2: 90% 93% 94% 93%     General:  NAD, sitting up in bed, appears to be breathing comfortably  Eyes: PERRLA  ENT: mucous membranes are dry  Neck: supple  Cardiovascular: s1, s2, rrr  Respiratory: crackles b/l to mid back  Abdomen: soft, nt, nd, bs+  Skin: no rashes  Musculoskeletal: deferred  Psychiatric: normal affect, cooperative with exam  Neurologic: grossly intact, non focal  Labs on Admission:  Basic Metabolic Panel:  Recent Labs Lab 12/31/12 1542  NA 141  K 3.6  CL 100   CO2 24  GLUCOSE 221*  BUN 7  CREATININE 0.97  CALCIUM 9.5   Liver Function Tests:  Recent Labs Lab 12/31/12 1542  AST 24  ALT 16  ALKPHOS 101  BILITOT 1.3*  PROT 7.6  ALBUMIN 3.6   No results found for this basename: LIPASE, AMYLASE,  in the last 168 hours No results found for this basename: AMMONIA,  in the last 168 hours CBC:  Recent Labs Lab 12/31/12 1542  WBC 13.6*  NEUTROABS 4.6  HGB 15.3*  HCT 48.9*  MCV 92.4  PLT 258   Cardiac Enzymes:  Recent Labs Lab 12/31/12 1542  TROPONINI <0.30    BNP (last 3 results)  Recent Labs  12/31/12 1542  PROBNP 6101.0*   CBG: No results found for this basename: GLUCAP,  in the last 168 hours  Radiological Exams on Admission: Dg Chest Portable 1 View  12/31/2012  *RADIOLOGY REPORT*  Clinical Data: Respiratory distress  PORTABLE CHEST - 1 VIEW  Comparison: 06/01/2008  Findings: Cardiomegaly with moderate interstitial edema.  No definite pleural effusion.  No pneumothorax.  IMPRESSION: Cardiomegaly with moderate interstitial edema.   Original Report Authenticated By: Charline Bills, M.D.     EKG: Independently reviewed. Sinus rhythm with frequent PVCs.  No significant changes from prior EKG  Assessment/Plan Principal Problem:   Acute on chronic combined systolic and diastolic CHF (congestive heart failure) Active Problems:   DYSLIPIDEMIA   TOBACCO USER   CORONARY ATHEROSCLEROSIS NATIVE CORONARY ARTERY   Acute respiratory failure with hypoxia   Acute pulmonary edema   1. Acute on chronic combined congestive heart failure. Patient will be admitted to the step down unit for further monitoring. We'll continue BiPAP for respiratory support as necessary. She will receive intravenous Lasix and we will monitor ins and outs as well as daily weights. Clinically she is already improving. We will continue her beta blocker as well as her ARB. Continue aspirin. Cycle cardiac markers, check TSH. We will order 2-D  echocardiogram to reassess LV function. We will start the patient on nitro paste and we can hopefully discontinue her nitroglycerin infusion. Her EKG appears unchanged and does not appear to be signs of any acute ischemia. 2. Acute respiratory failure with hypoxia secondary to #1. Continue on BiPAP for now and wean down as tolerated. 3. Hypertension. Controlled 4. Leukocytosis. Likely due to stress demargination. No evidence of underlying infection. 5. Hyperlipidemia. Continue statin. 6. Coronary artery disease. Patient does not have chest pain and she does not have any objective evidence of ischemia. Continue to cycle cardiac markers and continue aspirin. 7. Tobacco user. Advised to quit smoking.  Code Status: full code Family Communication: discussed with patient and family members at the bedside Disposition Plan: discharge home once improved  Time spent: 60 mins  MEMON,JEHANZEB [facility] Pager [phone]  If 7PM-7AM, please contact night-coverage [email or web address] Password [facility] 12/31/2012, 6:25 PM

## 2012-12-31 NOTE — ED Notes (Signed)
Report given to Jessica, RN ICU. 

## 2012-12-31 NOTE — Progress Notes (Signed)
Pt moved up from er on 6lpm/[place] sat 89-91, Pt in room with family talking placed on NRB till she is over Chf exacerbation, f 16-18. Pt most likely does not need BIPAP any longer. Only reason for NRB is she continues to talk and sats decrease she does not show signs of dyspnea otherwise.

## 2012-12-31 NOTE — ED Notes (Signed)
Patient sitting up in bed, speaking with family members. Reports feeling much better. Color improved, skin warm/dry.

## 2013-01-01 ENCOUNTER — Encounter (HOSPITAL_COMMUNITY): Payer: Self-pay | Admitting: *Deleted

## 2013-01-01 DIAGNOSIS — J9600 Acute respiratory failure, unspecified whether with hypoxia or hypercapnia: Secondary | ICD-10-CM | POA: Diagnosis not present

## 2013-01-01 DIAGNOSIS — I5043 Acute on chronic combined systolic (congestive) and diastolic (congestive) heart failure: Secondary | ICD-10-CM | POA: Diagnosis not present

## 2013-01-01 DIAGNOSIS — J811 Chronic pulmonary edema: Secondary | ICD-10-CM | POA: Diagnosis not present

## 2013-01-01 DIAGNOSIS — I2510 Atherosclerotic heart disease of native coronary artery without angina pectoris: Secondary | ICD-10-CM

## 2013-01-01 LAB — BASIC METABOLIC PANEL
Chloride: 100 mEq/L (ref 96–112)
GFR calc non Af Amer: 63 mL/min — ABNORMAL LOW (ref >90)
Glucose, Bld: 139 mg/dL — ABNORMAL HIGH (ref 70–99)
Potassium: 3.7 mEq/L (ref 3.5–5.1)
Sodium: 141 mEq/L (ref 135–145)

## 2013-01-01 LAB — CBC
MCH: 28.2 pg (ref 26.0–34.0)
MCHC: 31.7 g/dL (ref 30.0–36.0)
Platelets: 255 10*3/uL (ref 150–400)
RBC: 5.03 MIL/uL (ref 3.87–5.11)

## 2013-01-01 LAB — TROPONIN I
Troponin I: <0.3 ng/mL (ref <0.30)
Troponin I: <0.3 ng/mL (ref <0.30)

## 2013-01-01 MED ORDER — BIOTENE DRY MOUTH MT LIQD
15.0000 mL | Freq: Two times a day (BID) | OROMUCOSAL | Status: DC
  Administered 2013-01-01 – 2013-01-03 (×6): 15 mL via OROMUCOSAL

## 2013-01-01 NOTE — Plan of Care (Signed)
Problem: Consults Goal: Heart Failure Patient Education (See Patient Education module for education specifics.) Outcome: Progressing Heart failure handout given and discussed with patient and family.  Heart Failure video watched and discussed, weight monitoring discussed Goal: Tobacco Cessation referral if indicated Outcome: Progressing Discussed need to quit smoking to patient  Problem: Phase I Progression Outcomes Goal: Pain controlled with appropriate interventions Outcome: Not Applicable Date Met:  01/01/13 No complaints of pain Goal: EF % per last Echo/documented,Core Reminder form on chart Outcome: Progressing Echo scheduled to be done Goal: Initial discharge plan identified Outcome: Progressing Patient lives alone Goal: Voiding-avoid urinary catheter unless indicated Outcome: Not Progressing Foley catheter being used for urine output monitoring

## 2013-01-01 NOTE — Progress Notes (Signed)
CRITICAL VALUE ALERT  Critical value received: Trop 0.59  Date of notification: 12/31/12  Time of notification: 2150  Critical value read back:yes  Nurse who received alert:  Rockwell Germany RN  MD notified (1st page):  Dr. Barnie Del  Time of first page:  2150  MD notified (2nd page):  Time of second page:  Responding MD: Dr. Barnie Del  Time MD responded: 2150

## 2013-01-01 NOTE — Progress Notes (Signed)
[facility] PROGRESS NOTE  Nicole Clements ZDG:[id number] DOB: Aug 15, 1941 DOA: 12/31/2012 PCP: Lupita Raider, MD  Assessment/Plan: 1. Acute on chronic combined congestive heart failure. Patient has been started on intravenous Lasix and has been diuresing well. She reports improvement of her symptoms. She has not required further BiPAP. She's been alternating between nasal cannula and nonrebreather due to low oxygen saturations. She is continued on beta blocker, ARB, nitro paste and aspirin. She did have a mild elevation in her serum troponin at 0.59. The following level had normalized. This is likely secondary to her congestive heart failure. Her EKG does not show any acute changes. 2-D echocardiogram has been ordered. She can likely follow up with her cardiologist as an outpatient. Clinically she is improving 2. Acute respiratory failure with hypoxia secondary to #1. Continue to wean down oxygen as tolerated 3. Hypertension. Stable 4. Leukocytosis, likely due to stress demargination, resolved 5. Hyperlipidemia. On statin 6. Coronary artery disease. Continue aspirin. Patient denies any chest pain.  Code Status: full code Family Communication: discussed plan of care with patient Disposition Plan: We'll continue to monitor in stepdown for now. Can transition to the telemetry unit once her oxygen requirement has stabilized. Anticipate she will discharge home in the next 24-48 hours.   Consultants:  none  Procedures:  none  HPI/Subjective: Patient is feeling better today. She feels that her shortness of breath is improving. She denies any chest pain.  Objective: Filed Vitals:   01/01/13 0559 01/01/13 0600 01/01/13 0722 01/01/13 0734  BP:  130/65  127/63  Pulse: 73 74  79  Temp:   98 F (36.7 C)   TempSrc:   Oral   Resp: 25 24    Height:      Weight: 77.9 kg (171 lb 11.8 oz)     SpO2: 97% 97%      Intake/Output Summary (Last 24 hours) at 01/01/13 0913 Last data filed at  01/01/13 0600  Gross per 24 hour  Intake      0 ml  Output   2300 ml  Net  -2300 ml   Filed Weights   12/31/12 1849 01/01/13 0559  Weight: 77.7 kg (171 lb 4.8 oz) 77.9 kg (171 lb 11.8 oz)    Exam:   General:  Sitting up in a chair, eating breakfast, no signs of distress  Cardiovascular: S1, S2, regular rate and rhythm, no pedal edema  Respiratory: Crackles at bases, improving  Abdomen: Soft, nontender, nondistended, bowel sounds are active  Data Reviewed: Basic Metabolic Panel:  Recent Labs Lab 12/31/12 1542 01/01/13 0457  NA 141 141  K 3.6 3.7  CL 100 100  CO2 24 29  GLUCOSE 221* 139*  BUN 7 15  CREATININE 0.97 0.91  CALCIUM 9.5 9.0   Liver Function Tests:  Recent Labs Lab 12/31/12 1542  AST 24  ALT 16  ALKPHOS 101  BILITOT 1.3*  PROT 7.6  ALBUMIN 3.6   No results found for this basename: LIPASE, AMYLASE,  in the last 168 hours No results found for this basename: AMMONIA,  in the last 168 hours CBC:  Recent Labs Lab 12/31/12 1542 01/01/13 0457  WBC 13.6* 9.5  NEUTROABS 4.6  --   HGB 15.3* 14.2  HCT 48.9* 44.8  MCV 92.4 89.1  PLT 258 255   Cardiac Enzymes:  Recent Labs Lab 12/31/12 1542 12/31/12 2150 01/01/13 0457  TROPONINI <0.30 0.59* <0.30   BNP (last 3 results)  Recent Labs  12/31/12 1542  PROBNP 6101.0*  CBG: No results found for this basename: GLUCAP,  in the last 168 hours  Recent Results (from the past 240 hour(s))  MRSA PCR SCREENING     Status: None   Collection Time    12/31/12  6:46 PM      Result Value Range Status   MRSA by PCR NEGATIVE  NEGATIVE Final   Comment:            The GeneXpert MRSA Assay (FDA     approved for NASAL specimens     only), is one component of a     comprehensive MRSA colonization     surveillance program. It is not     intended to diagnose MRSA     infection nor to guide or     monitor treatment for     MRSA infections.     Studies: Dg Chest Portable 1 View  12/31/2012   *RADIOLOGY REPORT*  Clinical Data: Respiratory distress  PORTABLE CHEST - 1 VIEW  Comparison: 06/01/2008  Findings: Cardiomegaly with moderate interstitial edema.  No definite pleural effusion.  No pneumothorax.  IMPRESSION: Cardiomegaly with moderate interstitial edema.   Original Report Authenticated By: Charline Bills, M.D.     Scheduled Meds: . antiseptic oral rinse  15 mL Mouth Rinse BID  . aspirin  325 mg Oral Daily  . carvedilol  12.5 mg Oral BID WC  . enoxaparin (LOVENOX) injection  40 mg Subcutaneous Q24H  . furosemide  40 mg Intravenous BID  . losartan  100 mg Oral Daily  . nitroGLYCERIN  1 inch Topical Q6H  . potassium chloride  40 mEq Oral BID  . simvastatin  40 mg Oral QHS  . sodium chloride  3 mL Intravenous Q12H   Continuous Infusions:   Principal Problem:   Acute on chronic combined systolic and diastolic CHF (congestive heart failure) Active Problems:   DYSLIPIDEMIA   TOBACCO USER   CORONARY ATHEROSCLEROSIS NATIVE CORONARY ARTERY   Acute respiratory failure with hypoxia   Acute pulmonary edema    Time spent:    [facility]  [facility] Pager [phone]. If 8PM-8AM, please contact night-coverage at [email or web address], password [facility] 01/01/2013, 9:13 AM  LOS: 1 day

## 2013-01-01 NOTE — Progress Notes (Signed)
Report called and given to [place], Charity fundraiser. Patient being transferred to dept 300. Patient alert, oriented and in stable condition at the time of transport.

## 2013-01-02 ENCOUNTER — Inpatient Hospital Stay (HOSPITAL_COMMUNITY): Payer: Medicare Other

## 2013-01-02 DIAGNOSIS — I5043 Acute on chronic combined systolic (congestive) and diastolic (congestive) heart failure: Secondary | ICD-10-CM | POA: Diagnosis not present

## 2013-01-02 DIAGNOSIS — R918 Other nonspecific abnormal finding of lung field: Secondary | ICD-10-CM | POA: Diagnosis not present

## 2013-01-02 DIAGNOSIS — E785 Hyperlipidemia, unspecified: Secondary | ICD-10-CM

## 2013-01-02 DIAGNOSIS — F17200 Nicotine dependence, unspecified, uncomplicated: Secondary | ICD-10-CM | POA: Diagnosis not present

## 2013-01-02 DIAGNOSIS — J811 Chronic pulmonary edema: Secondary | ICD-10-CM | POA: Diagnosis not present

## 2013-01-02 LAB — PRO B NATRIURETIC PEPTIDE: Pro B Natriuretic peptide (BNP): 2144 pg/mL — ABNORMAL HIGH (ref 0–125)

## 2013-01-02 LAB — BASIC METABOLIC PANEL
CO2: 28 mEq/L (ref 19–32)
Chloride: 101 mEq/L (ref 96–112)
GFR calc Af Amer: 76 mL/min — ABNORMAL LOW (ref >90)
Potassium: 4.2 mEq/L (ref 3.5–5.1)

## 2013-01-02 LAB — TSH: TSH: 0.686 u[IU]/mL (ref 0.350–4.500)

## 2013-01-02 MED ORDER — FUROSEMIDE 10 MG/ML IJ SOLN
40.0000 mg | Freq: Once | INTRAMUSCULAR | Status: AC
  Administered 2013-01-02: 40 mg via INTRAVENOUS

## 2013-01-02 NOTE — Progress Notes (Signed)
[facility] PROGRESS NOTE  Nicole Clements ZOX:[id number] DOB: December 26, 1940 DOA: 12/31/2012 PCP: Lupita Raider, MD  Assessment/Plan: 1. Acute on chronic combined congestive heart failure. Patient has been started on intravenous Lasix and has been diuresing well. She reports improvement of her symptoms. She has not required further BiPAP. She's been alternating between nasal cannula and nonrebreather due to low oxygen saturations. She is continued on beta blocker, ARB, nitro paste and aspirin. She did have a mild elevation in her serum troponin at 0.59. The following level had normalized. This is likely secondary to her congestive heart failure. Her EKG does not show any acute changes. 2-D echocardiogram has been ordered. She can likely follow up with her cardiologist as an outpatient. Clinically she is improving.  BNP today down to about 2000. With ambulation on room air, her sats didn't get to as low as 88%. We'll continue diuresis on by mouth Lasix and added additional dose of Lasix. Should be ready for discharge tomorrow. 2. Acute respiratory failure with hypoxia secondary to #1. Continue to wean down oxygen as tolerated 3. Hypertension. Stable 4. Leukocytosis, likely due to stress demargination, resolved 5. Hyperlipidemia. On statin 6. Coronary artery disease. Continue aspirin. Patient denies any chest pain.  Code Status: full code Family Communication: discussed plan of care with patient Disposition Plan: Likely will go home tomorrow.   Consultants:  none  Procedures:  none  HPI/Subjective: Patient is feeling better today. She feels that her shortness of breath or chest pain at rest. With ambulation, she felt mildly short of breath-this was on room air  Objective: Filed Vitals:   01/01/13 2042 01/01/13 2132 01/02/13 0500 01/02/13 0546  BP:  115/72  127/73  Pulse:  69  15  Temp:  98.1 F (36.7 C)  98.1 F (36.7 C)  TempSrc:  Oral  Oral  Resp:  20  20  Height:       Weight:   79.379 kg (175 lb)   SpO2: 94% 94%  100%    Intake/Output Summary (Last 24 hours) at 01/02/13 1539 Last data filed at 01/02/13 1111  Gross per 24 hour  Intake    743 ml  Output   1750 ml  Net  -1007 ml   Filed Weights   12/31/12 1849 01/01/13 0559 01/02/13 0500  Weight: 77.7 kg (171 lb 4.8 oz) 77.9 kg (171 lb 11.8 oz) 79.379 kg (175 lb)    Exam:   General:  Sitting up in a chair, eating breakfast, no signs of distress  Cardiovascular: S1, S2, regular rate and rhythm, no pedal edema  Respiratory: Decreased breath sounds at the bases  Abdomen: Soft, nontender, nondistended, bowel sounds are active  Extremities: No clubbing or cyanosis or edema  Data Reviewed: Basic Metabolic Panel:  Recent Labs Lab 12/31/12 1542 01/01/13 0457 01/02/13 0521  NA 141 141 140  K 3.6 3.7 4.2  CL 100 100 101  CO2 24 29 28   GLUCOSE 221* 139* 106*  BUN 7 15 22   CREATININE 0.97 0.91 0.88  CALCIUM 9.5 9.0 8.9   Liver Function Tests:  Recent Labs Lab 12/31/12 1542  AST 24  ALT 16  ALKPHOS 101  BILITOT 1.3*  PROT 7.6  ALBUMIN 3.6   CBC:  Recent Labs Lab 12/31/12 1542 01/01/13 0457  WBC 13.6* 9.5  NEUTROABS 4.6  --   HGB 15.3* 14.2  HCT 48.9* 44.8  MCV 92.4 89.1  PLT 258 255   Cardiac Enzymes:  Recent Labs Lab 12/31/12 1542 12/31/12  2150 01/01/13 0457 01/01/13 1050  TROPONINI <0.30 0.59* <0.30 <0.30   BNP (last 3 results)  Recent Labs  12/31/12 1542 01/02/13 0521  PROBNP 6101.0* 2144.0*       Studies: Dg Chest Port 1 View  01/02/2013   IMPRESSION: Overall finding suggestive of improving pulmonary edema and atelectasis.  A follow-up chest radiograph in 4 to 6 weeks after treatment is recommended to ensure resolution.   Original Report Authenticated By: Tacey Ruiz, MD    Dg Chest Portable 1 View  12/31/2012    IMPRESSION: Cardiomegaly with moderate interstitial edema.   Original Report Authenticated By: Charline Bills, M.D.      Scheduled Meds: . antiseptic oral rinse  15 mL Mouth Rinse BID  . aspirin  325 mg Oral Daily  . carvedilol  12.5 mg Oral BID WC  . enoxaparin (LOVENOX) injection  40 mg Subcutaneous Q24H  . furosemide  40 mg Intravenous BID  . losartan  100 mg Oral Daily  . nitroGLYCERIN  1 inch Topical Q6H  . potassium chloride  40 mEq Oral BID  . simvastatin  40 mg Oral QHS  . sodium chloride  3 mL Intravenous Q12H   Continuous Infusions:   Principal Problem:   Acute on chronic combined systolic and diastolic CHF (congestive heart failure) Active Problems:   DYSLIPIDEMIA   TOBACCO USER   CORONARY ATHEROSCLEROSIS NATIVE CORONARY ARTERY   Acute respiratory failure with hypoxia   Acute pulmonary edema    Time spent: 25 minutes    Hollice Espy  [facility] Pager [phone]. If 7 PM-7 AM, please contact night-coverage at [email or web address], password [facility] 01/02/2013, 3:39 PM  LOS: 2 days

## 2013-01-02 NOTE — Progress Notes (Addendum)
Patient ambulated in hall without oxygen, sat 93%. Patient back to chair without oxygen eating lunch, sats 88%. Oxygen placed back on 3lpm. Patient with episodes of Vtach. Dr. Rito Ehrlich notified. New orders to give one time dose Lasix 40mg 

## 2013-01-03 DIAGNOSIS — I517 Cardiomegaly: Secondary | ICD-10-CM

## 2013-01-03 DIAGNOSIS — I252 Old myocardial infarction: Secondary | ICD-10-CM

## 2013-01-03 DIAGNOSIS — I5043 Acute on chronic combined systolic (congestive) and diastolic (congestive) heart failure: Secondary | ICD-10-CM | POA: Diagnosis not present

## 2013-01-03 DIAGNOSIS — J9600 Acute respiratory failure, unspecified whether with hypoxia or hypercapnia: Secondary | ICD-10-CM | POA: Diagnosis not present

## 2013-01-03 DIAGNOSIS — F17200 Nicotine dependence, unspecified, uncomplicated: Secondary | ICD-10-CM | POA: Diagnosis not present

## 2013-01-03 LAB — PATHOLOGIST SMEAR REVIEW

## 2013-01-03 LAB — BASIC METABOLIC PANEL
CO2: 30 mEq/L (ref 19–32)
Chloride: 102 mEq/L (ref 96–112)
GFR calc Af Amer: 78 mL/min — ABNORMAL LOW (ref >90)
Potassium: 4.4 mEq/L (ref 3.5–5.1)
Sodium: 140 mEq/L (ref 135–145)

## 2013-01-03 MED ORDER — FUROSEMIDE 40 MG PO TABS: 40.0000 mg | ORAL_TABLET | Freq: Every day | ORAL | Status: DC

## 2013-01-03 MED ORDER — POTASSIUM CHLORIDE CRYS ER 20 MEQ PO TBCR: 20.0000 meq | EXTENDED_RELEASE_TABLET | Freq: Every day | ORAL | Status: DC

## 2013-01-03 NOTE — Progress Notes (Signed)
Discharge instructions reviewed with pt and pt's sister. Verbalized understanding. Pt discharged to home with family. Nicanor Alcon 01/03/2013

## 2013-01-03 NOTE — Progress Notes (Signed)
*  PRELIMINARY RESULTS* Echocardiogram 2D Echocardiogram has been performed.  Nicole Clements 01/03/2013, 10:48 AM

## 2013-01-03 NOTE — Discharge Summary (Signed)
Physician Discharge Summary  Nicole Clements ZOX:[id number] DOB: 01-22-1941 DOA: 12/31/2012  PCP: Lupita Raider, MD  Admit date: 12/31/2012 Discharge date: 01/03/2013  Time spent: 20 minutes  Recommendations for Outpatient Follow-up:  1. Patient will follow up with Dr. wall, her cardiologist in the next 2-3 weeks. 2. Echocardiogram was ordered on admission, but because of the weekend, was only done prior to discharge. She will follow up with cardiology for the results  Discharge Diagnoses:  Principal Problem:   Acute on chronic combined systolic and diastolic CHF (congestive heart failure) Active Problems:   DYSLIPIDEMIA   TOBACCO USER   CORONARY ATHEROSCLEROSIS NATIVE CORONARY ARTERY   Acute respiratory failure with hypoxia   Acute pulmonary edema   Discharge Condition: Improved, being discharged home  Diet recommendation: Low sodium heart healthy  Filed Weights   01/01/13 0559 01/02/13 0500 01/03/13 0500  Weight: 77.9 kg (171 lb 11.8 oz) 79.379 kg (175 lb) 79 kg (174 lb 2.6 oz)    History of present illness:  Nicole Clements is a 72 y.o. female with history of chronic combined congestive heart failure who presents to the emergency room with shortness of breath. On arrival to the emergency room, patient was in respiratory distress and could not provide any history. She was on a nonrebreather and was hypoxic with oxygen saturations in the 70s. Suspicion was for congestive heart failure, so she received Lasix and was started on BiPAP. At the time of my evaluation, patient is breathing more comfortably and is able to participate in history. She reports that her shortness of breath has been occurring for approximately 2-3 weeks. This has progressively gotten worse. She denies any fever or significant cough. She has not had any chest pain. She does admit to experiencing orthopnea and paroxysmal nocturnal dyspnea. She felt her breathing significantly got worse today. She has not been prescribed  a diuretic as an outpatient. She has not been checking her weight but feels that it has not changed. She admits to being noncompliant with her diet with regards to salt. She is currently on BiPAP and is clinically feeling much better. Admission has been requested for congestive heart failure exacerbation.   Hospital Course:  Principal Problem:   Acute on chronic combined systolic and diastolic CHF (congestive heart failure): Patient was started on IV Lasix and diuresed well. Prior to discharge, she has diuresed about 4 L. She has not been on Lasix at home and so will send her on 40 mg by mouth daily +20 mEq of potassium Active Problems:   DYSLIPIDEMIA: Continue statin   TOBACCO USER: Patient advised to quit, but a long-time smoker and plans to continue   CORONARY ATHEROSCLEROSIS NATIVE CORONARY ARTERY: Stable   Acute respiratory failure with hypoxia   Hypertension: Stable. Continue home meds. Leukocytosis: Elevated on admission, but resolved next day without antibiotics. Likely felt to be secondary to stress margination   Procedures:  Echocardiogram: Results be followed up with cardiology  Consultations:  None  Discharge Exam: Filed Vitals:   01/02/13 2210 01/03/13 0500 01/03/13 0607 01/03/13 1019  BP: 109/52  104/46 125/68  Pulse: 51  58   Temp: 98.1 F (36.7 C)  97.8 F (36.6 C)   TempSrc: Oral  Oral   Resp: 20  20   Height:      Weight:  79 kg (174 lb 2.6 oz)    SpO2: 94%  96%     General: Alert and oriented x3, no acute distress Cardiovascular: Regular rate and  rhythm, S1-S2, very soft 2/6 systolic ejection murmur Respiratory: Clear to auscultation bilaterally Abdomen: Soft, nontender, nondistended positive bowel sounds Extremities: Clubbing or cyanosis or edema  Discharge Instructions  Discharge Orders   Future Appointments [facility] Department Dept Phone   01/06/2013 3:00 PM Jodelle Gross, NP [facility] [phone]   Future Orders  Complete By Expires     Diet - low sodium heart healthy  As directed     Increase activity slowly  As directed         Medication List    TAKE these medications       aspirin 325 MG tablet  Take 325 mg by mouth daily.     carvedilol 12.5 MG tablet  Commonly known as:  COREG  Take 1 tablet (12.5 mg total) by mouth 2 (two) times daily.     CITRACAL PO  Take 1 tablet by mouth daily.     Fish Oil 1000 MG Caps  Take 1 capsule by mouth daily.     furosemide 40 MG tablet  Commonly known as:  LASIX  Take 1 tablet (40 mg total) by mouth daily.     losartan 100 MG tablet  Commonly known as:  COZAAR  Take 1 tablet (100 mg total) by mouth daily.     potassium chloride SA 20 MEQ tablet  Commonly known as:  K-DUR,KLOR-CON  Take 1 tablet (20 mEq total) by mouth daily.     simvastatin 40 MG tablet  Commonly known as:  ZOCOR  Take 1 tablet (40 mg total) by mouth at bedtime.     Vitamin D3 2000 UNITS capsule  Take 2,000 Units by mouth daily.           Follow-up Information   Follow up with SHAW,KIMBERLEE, MD In 1 month. (As needed)    Contact information:   [address] [place] [address] [phone]       Follow up with Valera Castle, MD In 2 weeks.   Contact information:   [address]   [address] [place] [address] [phone]        The results of significant diagnostics from this hospitalization (including imaging, microbiology, ancillary and laboratory) are listed below for reference.    Significant Diagnostic Studies: Dg Chest Port 1 View  01/02/2013    IMPRESSION: Overall finding suggestive of improving pulmonary edema and atelectasis.  A follow-up chest radiograph in 4 to 6 weeks after treatment is recommended to ensure resolution.   Original Report Authenticated By: Tacey Ruiz, MD    Dg Chest Portable 1 View  12/31/2012    IMPRESSION: Cardiomegaly with moderate interstitial edema.   Original Report Authenticated By:  Charline Bills, M.D.      Labs: Basic Metabolic Panel:  Recent Labs Lab 12/31/12 1542 01/01/13 0457 01/02/13 0521 01/03/13 0607  NA 141 141 140 140  K 3.6 3.7 4.2 4.4  CL 100 100 101 102  CO2 24 29 28 30   GLUCOSE 221* 139* 106* 98  BUN 7 15 22 21   CREATININE 0.97 0.91 0.88 0.86  CALCIUM 9.5 9.0 8.9 9.0   Liver Function Tests:  Recent Labs Lab 12/31/12 1542  AST 24  ALT 16  ALKPHOS 101  BILITOT 1.3*  PROT 7.6  ALBUMIN 3.6   CBC:  Recent Labs Lab 12/31/12 1542 01/01/13 0457  WBC 13.6* 9.5  NEUTROABS 4.6  --   HGB 15.3* 14.2  HCT 48.9* 44.8  MCV 92.4 89.1  PLT 258 255   Cardiac Enzymes:  Recent Labs Lab 12/31/12 1542 12/31/12 2150 01/01/13 0457 01/01/13 1050  TROPONINI <0.30 0.59* <0.30 <0.30   BNP: BNP (last 3 results)  Recent Labs  12/31/12 1542 01/02/13 0521  PROBNP 6101.0* 2144.0*     Signed:  Virginia Rochester K  [facility] 01/03/2013, 10:46 AM

## 2013-01-05 NOTE — Progress Notes (Signed)
UR Chart Review Completed  

## 2013-01-06 ENCOUNTER — Ambulatory Visit: Payer: Medicare Other | Admitting: Adult Health

## 2013-01-19 NOTE — Progress Notes (Signed)
Please advise, noted routing issues, confirmed routed to proper inbox on 01-14-13 via DG

## 2013-01-26 ENCOUNTER — Encounter: Payer: Self-pay | Admitting: Cardiology

## 2013-01-26 ENCOUNTER — Ambulatory Visit (INDEPENDENT_AMBULATORY_CARE_PROVIDER_SITE_OTHER): Payer: Medicare Other | Admitting: Cardiology

## 2013-01-26 VITALS — BP 128/65 | HR 62 | Ht 63.0 in | Wt 170.2 lb

## 2013-01-26 DIAGNOSIS — I5043 Acute on chronic combined systolic (congestive) and diastolic (congestive) heart failure: Secondary | ICD-10-CM | POA: Diagnosis not present

## 2013-01-26 DIAGNOSIS — I509 Heart failure, unspecified: Secondary | ICD-10-CM | POA: Diagnosis not present

## 2013-01-26 DIAGNOSIS — E785 Hyperlipidemia, unspecified: Secondary | ICD-10-CM

## 2013-01-26 DIAGNOSIS — I252 Old myocardial infarction: Secondary | ICD-10-CM | POA: Diagnosis not present

## 2013-01-26 DIAGNOSIS — F17200 Nicotine dependence, unspecified, uncomplicated: Secondary | ICD-10-CM

## 2013-01-26 DIAGNOSIS — I5042 Chronic combined systolic (congestive) and diastolic (congestive) heart failure: Secondary | ICD-10-CM

## 2013-01-26 DIAGNOSIS — I2510 Atherosclerotic heart disease of native coronary artery without angina pectoris: Secondary | ICD-10-CM

## 2013-01-26 LAB — BASIC METABOLIC PANEL
CO2: 25 mEq/L (ref 19–32)
Calcium: 9.6 mg/dL (ref 8.4–10.5)
Chloride: 106 mEq/L (ref 96–112)
Glucose, Bld: 129 mg/dL — ABNORMAL HIGH (ref 70–99)
Potassium: 4.2 mEq/L (ref 3.5–5.3)
Sodium: 140 mEq/L (ref 135–145)

## 2013-01-26 MED ORDER — POTASSIUM CHLORIDE CRYS ER 20 MEQ PO TBCR: 20.0000 meq | EXTENDED_RELEASE_TABLET | Freq: Every day | ORAL | Status: DC

## 2013-01-26 MED ORDER — FUROSEMIDE 40 MG PO TABS: 40.0000 mg | ORAL_TABLET | Freq: Every day | ORAL | Status: DC

## 2013-01-26 NOTE — Patient Instructions (Addendum)
Your physician recommends that you schedule a follow-up appointment in: TO BE DECIDED BY MD GREGG TAYLOR, AN APT WILL BE MADE WITH MD GT TODAY TO DISCUSS DEFIBRILLATOR  Your physician recommends that you return for lab work in: TODAY (BMET)  SLIPS GIVEN   Your physician recommends that you weigh, daily, at the same time every day, and in the same amount of clothing. Please record your daily weights on the handout provided and bring it to your next appointment.YOU HAVE A 6 POUND WINDOW TO  MONITOR, MD WALL ADVISED TO ANY 3 POUND WEIGHT GAIN WILL REQUIRE  CONSUMPTION OF AN ADDITIONAL 40MG  LASIX TABLET AND POTASSIUM TABLET EVERY DAY UNTIL BASELINE WEIGHT IS RETAINED, IF WEIGHT GAIN ABOVE 6 POUNDS IS NOT PLEASE CONTACT OUR OFFICE AT [phone]

## 2013-01-26 NOTE — Progress Notes (Signed)
Noted MD Wall evaluated pt in office today for her follow up

## 2013-01-26 NOTE — Assessment & Plan Note (Signed)
She now had a long talk about this today. I delighted that she is being more compliant and is trying to quit smoking. Her Lasix and potassium are renewed. Daily weighing in her window reviewed and how to titrate her Lasix with weight gain.  She is now a candidate for a defibrillator. With her conduction delay even a biventricular device may be indicated. I'll schedule her to see electrophysiology with Dr. Ladona Ridgel.

## 2013-01-26 NOTE — Progress Notes (Signed)
HPI Nicole Clements returns today after hospitalization for acute pulmonary edema. 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 Her last ejection fraction prior to that admission was around 45%. Her blood pressure was very difficult to control. Much of this I felt was secondary to heavy smoking. We've had long discussions about this to the years.  Echocardiogram during her hospitalization showed severe LV dysfunction with an EF of 25%. She has biatrial enlargement. Her cardiac markers were marginally positive most likely consistent with congestive heart failure.  Since discharge she's been weighing herself daily. Her baseline weights 167 at home. Her window is 163-170. She is on daily Lasix now as well as potassium. She denies any chest pain or angina. She does orthopnea, PND or edema. She has cut her smoking in half.  Past Medical History  Diagnosis Date  . 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 CAD (coronary artery disease)   . Hypertension   . Hypercholesterolemia   . S/P colonoscopy August 2007    hyperplastic polyps, rare sigmoid and descending colon diverticulosis, small internal hemorrhoids  . CHF (congestive heart failure) 12/31/2012    Current Outpatient Prescriptions  Medication Sig Dispense Refill  . aspirin 325 MG tablet Take 325 mg by mouth daily.        . Calcium Citrate (CITRACAL PO) Take 1 tablet by mouth daily.        . carvedilol (COREG) 12.5 MG tablet Take 1 tablet (12.5 mg total) by mouth 2 (two) times daily.  60 tablet  5  . Cholecalciferol (VITAMIN D3) 2000 UNITS capsule Take 2,000 Units by mouth daily.        . furosemide (LASIX) 40 MG tablet Take 1 tablet (40 mg total) by mouth daily.  30 tablet  0  . losartan (COZAAR) 100 MG tablet Take 1 tablet (100 mg total) by mouth daily.  30 tablet  5  . Omega-3 Fatty Acids (FISH OIL) 1000 MG CAPS Take 1 capsule by mouth daily.        . potassium chloride SA  (K-DUR,KLOR-CON) 20 MEQ tablet Take 1 tablet (20 mEq total) by mouth daily.  30 tablet  0  . simvastatin (ZOCOR) 40 MG tablet Take 1 tablet (40 mg total) by mouth at bedtime.  90 tablet  3   No current facility-administered medications for this visit.    Allergies  Allergen Reactions  . Sulfa Antibiotics Rash    Family History  Problem Relation Age of Onset  . Colon cancer Neg Hx   . Hypertension Sister   . Diabetes Mellitus II Sister   . Hypertension Brother   . Diabetes Mellitus II Brother   . Hypertension Brother   . Diabetes Mellitus II Brother   . Hypertension Brother   . Diabetes Mellitus II Brother     History   Social History  . Marital Status: Single    Spouse Name: N/A    Number of Children: 1  . Years of Education: N/A   Occupational History  . retired     Clinical biochemist rep   Social History Main Topics  . Smoking status: Current Every Day Smoker -- 0.50 packs/day for 50 years  . Smokeless tobacco: Never Used     Comment: down to a half a pack daily for the past month   . Alcohol Use: No  . Drug Use: No  . Sexually Active: Not on file   Other Topics  Concern  . Not on file   Social History Narrative  . No narrative on file    ROS ALL NEGATIVE EXCEPT THOSE NOTED IN HPI  PE  General Appearance: well developed, well nourished in no acute distress HEENT: symmetrical face, PERRLA, good dentition  Neck: no JVD, thyromegaly, or adenopathy, trachea midline Chest: symmetric without deformity Cardiac: PMI non-displaced, RRR, normal S1, S2, no gallop or murmur Lung: clear to ausculation and percussion Vascular: all pulses full without bruits  Abdominal: nondistended, nontender, good bowel sounds, no HSM, no bruits Extremities: no cyanosis, clubbing or edema, no sign of DVT, no varicosities  Skin: normal color, no rashes Neuro: alert and oriented x 3, non-focal Pysch: normal affect  EKG Not repeated. Has a history of interventricular conduction  delay. BMET    Component Value Date/Time   NA 140 01/03/2013 0607   K 4.4 01/03/2013 0607   CL 102 01/03/2013 0607   CO2 30 01/03/2013 0607   GLUCOSE 98 01/03/2013 0607   BUN 21 01/03/2013 0607   CREATININE 0.86 01/03/2013 0607   CREATININE 0.94 12/03/2012 0915   CALCIUM 9.0 01/03/2013 0607   GFRNONAA 67* 01/03/2013 0607   GFRAA 78* 01/03/2013 0607    Lipid Panel     Component Value Date/Time   CHOL 133 12/03/2012 0915   TRIG 121 12/03/2012 0915   HDL 34* 12/03/2012 0915   CHOLHDL 3.9 12/03/2012 0915   VLDL 24 12/03/2012 0915   LDLCALC 75 12/03/2012 0915    CBC    Component Value Date/Time   WBC 9.5 01/01/2013 0457   RBC 5.03 01/01/2013 0457   HGB 14.2 01/01/2013 0457   HCT 44.8 01/01/2013 0457   PLT 255 01/01/2013 0457   MCV 89.1 01/01/2013 0457   MCH 28.2 01/01/2013 0457   MCHC 31.7 01/01/2013 0457   RDW 14.5 01/01/2013 0457   LYMPHSABS 7.7* 12/31/2012 1542   MONOABS 1.0 12/31/2012 1542   EOSABS 0.3 12/31/2012 1542   BASOSABS 0.0 12/31/2012 1542

## 2013-02-01 DIAGNOSIS — I2510 Atherosclerotic heart disease of native coronary artery without angina pectoris: Secondary | ICD-10-CM

## 2013-02-01 HISTORY — PX: CARDIAC CATHETERIZATION: SHX172

## 2013-02-01 HISTORY — DX: Atherosclerotic heart disease of native coronary artery without angina pectoris: I25.10

## 2013-02-07 DIAGNOSIS — F17200 Nicotine dependence, unspecified, uncomplicated: Secondary | ICD-10-CM | POA: Diagnosis not present

## 2013-02-07 DIAGNOSIS — I110 Hypertensive heart disease with heart failure: Secondary | ICD-10-CM | POA: Diagnosis not present

## 2013-02-07 DIAGNOSIS — I509 Heart failure, unspecified: Secondary | ICD-10-CM | POA: Diagnosis not present

## 2013-02-07 DIAGNOSIS — E782 Mixed hyperlipidemia: Secondary | ICD-10-CM | POA: Diagnosis not present

## 2013-02-21 ENCOUNTER — Ambulatory Visit (INDEPENDENT_AMBULATORY_CARE_PROVIDER_SITE_OTHER): Payer: Medicare Other | Admitting: Internal Medicine

## 2013-02-21 ENCOUNTER — Encounter: Payer: Self-pay | Admitting: *Deleted

## 2013-02-21 ENCOUNTER — Encounter: Payer: Self-pay | Admitting: Internal Medicine

## 2013-02-21 VITALS — BP 147/72 | HR 61 | Ht 63.0 in | Wt 166.6 lb

## 2013-02-21 DIAGNOSIS — I5022 Chronic systolic (congestive) heart failure: Secondary | ICD-10-CM | POA: Diagnosis not present

## 2013-02-21 DIAGNOSIS — I2589 Other forms of chronic ischemic heart disease: Secondary | ICD-10-CM

## 2013-02-21 DIAGNOSIS — I255 Ischemic cardiomyopathy: Secondary | ICD-10-CM

## 2013-02-21 LAB — BASIC METABOLIC PANEL
BUN: 13 mg/dL (ref 6–23)
CO2: 28 mEq/L (ref 19–32)
Chloride: 103 mEq/L (ref 96–112)
Creatinine, Ser: 0.9 mg/dL (ref 0.4–1.2)
Glucose, Bld: 90 mg/dL (ref 70–99)

## 2013-02-21 LAB — CBC WITH DIFFERENTIAL/PLATELET
Basophils Absolute: 0 10*3/uL (ref 0.0–0.1)
Basophils Relative: 0.5 % (ref 0.0–3.0)
Eosinophils Absolute: 0.2 10*3/uL (ref 0.0–0.7)
MCHC: 32.6 g/dL (ref 30.0–36.0)
MCV: 86.3 fl (ref 78.0–100.0)
Monocytes Absolute: 0.6 10*3/uL (ref 0.1–1.0)
Neutrophils Relative %: 37.1 % — ABNORMAL LOW (ref 43.0–77.0)
RBC: 5.49 Mil/uL — ABNORMAL HIGH (ref 3.87–5.11)
RDW: 15.4 % — ABNORMAL HIGH (ref 11.5–14.6)

## 2013-02-21 LAB — PROTIME-INR: INR: 1.2 ratio — ABNORMAL HIGH (ref 0.8–1.0)

## 2013-02-21 NOTE — Progress Notes (Signed)
 ELECTROPHYSIOLOGY CONSULT NOTE  Patient ID: Nicole Clements, MRN: [id number], DOB/AGE: 12/19/1940 72 y.o. Admit date: (Not on file) Date of Consult: 02/21/2013  Primary Physician: SHAW,KIMBERLEE, MD Primary Cardiologist: *TW  Chief Complaint: ICD    HPI Nicole Clements is a 72 y.o. female  Seen at The request of Dr. Wall for consideration of ICD implantation.  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 Echocardiogram 3/11 demonstrated ejection fraction of 45-50% Myoview scan 3/11 demonstrated ejection fraction of 29%with no clear or prior infarction. There is hypokinesis of the inferior wall;      she was recently hospitalized for congestive heart failure and at that time her ejection fraction was 25%.;  Prior to that she had symptoms of dyspnea on exertion, edema of hands and feet, orthopnea and nocturnal dyspnea. Diuretics were initiated and had been maintained and the symptoms have essentially resolved.  Her last catheterization was 1996 at which time she had angioplasty. These records are not available. She has had no recent evaluation of myocardial perfusion. She's had no chest pain.  She does have occasional palpitations. She's had no syncope     Past Medical History  Diagnosis Date  . Myocardial infarct 07/22/1996    st.elevated inferior wall infarct  . CAD (coronary artery disease)   . Hypertension   . Hypercholesterolemia   . S/P colonoscopy August 2007    hyperplastic polyps, rare sigmoid and descending colon diverticulosis, small internal hemorrhoids  . CHF (congestive heart failure) 12/31/2012      Surgical History:  Past Surgical History  Procedure Laterality Date  . Cardiac catheterization    . S/p hysterectomy       Home Meds: Prior to Admission medications   Medication Sig Start Date End Date Taking? Authorizing Provider  aspirin 325 MG tablet Take 325 mg by mouth daily.     Yes Historical Provider, MD  Calcium Citrate  (CITRACAL PO) Take 1 tablet by mouth daily.     Yes Historical Provider, MD  carvedilol (COREG) 12.5 MG tablet Take 1 tablet (12.5 mg total) by mouth 2 (two) times daily. 11/22/12  Yes Thomas C Wall, MD  Cholecalciferol (VITAMIN D3) 2000 UNITS capsule Take 2,000 Units by mouth daily.     Yes Historical Provider, MD  furosemide (LASIX) 40 MG tablet Take 1 tablet (40 mg total) by mouth daily. 01/26/13  Yes Thomas C Wall, MD  losartan (COZAAR) 100 MG tablet Take 1 tablet (100 mg total) by mouth daily. 11/22/12  Yes Thomas C Wall, MD  Omega-3 Fatty Acids (FISH OIL) 1000 MG CAPS Take 1 capsule by mouth daily.     Yes Historical Provider, MD  potassium chloride SA (K-DUR,KLOR-CON) 20 MEQ tablet Take 1 tablet (20 mEq total) by mouth daily. 01/26/13  Yes Thomas C Wall, MD  simvastatin (ZOCOR) 40 MG tablet Take 1 tablet (40 mg total) by mouth at bedtime. 10/06/12  Yes Thomas C Wall, MD    Allergies:  Allergies  Allergen Reactions  . Sulfa Antibiotics Rash    History   Social History  . Marital Status: Single    Spouse Name: N/A    Number of Children: 1  . Years of Education: N/A   Occupational History  . retired     customer service rep   Social History Main Topics  . Smoking status: Current Every Day Smoker -- 0.50 packs/day for 50 years  . Smokeless tobacco: Never Used     Comment:   down to a half a pack daily for the past month   . Alcohol Use: No  . Drug Use: No  . Sexually Active: Not on file   Other Topics Concern  . Not on file   Social History Narrative  . No narrative on file     Family History  Problem Relation Age of Onset  . Colon cancer Neg Hx   . Hypertension Sister   . Diabetes Mellitus II Sister   . Hypertension Brother   . Diabetes Mellitus II Brother   . Hypertension Brother   . Diabetes Mellitus II Brother   . Hypertension Brother   . Diabetes Mellitus II Brother      ROS:  Please see the history of present illness.   Negative except  palps  All other  systems reviewed and negative.    Physical Exam:   Blood pressure 147/72, pulse 61, height 5' 3" (1.6 m), weight 166 lb 9.6 oz (75.569 kg). General: Well developed, well nourished female in no acute distress. Head: Normocephalic, atraumatic, sclera non-icteric, no xanthomas, nares are without discharge. EENT: normal Lymph Nodes:  none Back: without scoliosis/kyphosis , no CVA tendersness Neck: Negative for carotid bruits. JVD 6-7 Lungs: Clear bilaterally to auscultation without wheezes, rales, or rhonchi. Breathing is unlabored. Heart: RRR with S1 S2.+ s4 murmur , rubs, or gallops appreciated. Abdomen: Soft, non-tender, non-distended with normoactive bowel sounds. No hepatomegaly. No rebound/guarding. No obvious abdominal masses. Msk:  Strength and tone appear normal for age. Extremities: No clubbing or cyanosis. No edema  +  edema.  Distal pedal pulses are 2+ and equal bilaterally. Skin: Warm and Dry Neuro: Alert and oriented X 3. CN III-XII intact Grossly normal sensory and motor function . Psych:  Responds to questions appropriately with a normal affect.      Labs: Cardiac Enzymes No results found for this basename: CKTOTAL, CKMB, TROPONINI,  in the last 72 hours CBC Lab Results  Component Value Date   WBC 9.5 01/01/2013   HGB 14.2 01/01/2013   HCT 44.8 01/01/2013   MCV 89.1 01/01/2013   PLT 255 01/01/2013   PROTIME: No results found for this basename: LABPROT, INR,  in the last 72 hours Chemistry No results found for this basename: NA, K, CL, CO2, BUN, CREATININE, CALCIUM, LABALBU, PROT, BILITOT, ALKPHOS, ALT, AST, GLUCOSE,  in the last 168 hours Lipids Lab Results  Component Value Date   CHOL 133 12/03/2012   HDL 34* 12/03/2012   LDLCALC 75 12/03/2012   TRIG 121 12/03/2012   BNP Pro B Natriuretic peptide (BNP)  Date/Time Value Range Status  01/02/2013  5:21 AM 2144.0* 0 - 125 pg/mL Final  12/31/2012  3:42 PM 6101.0* 0 - 125 pg/mL Final   Miscellaneous No results found for  this basename: DDIMER    Radiology/Studies:  No results found.  EKG:  Sinus rhtym with nonspecific IVCD. She has a QS in V1 but rS and V6 as well as 1 and L.   Assessment and Plan:    Nicole Clements   

## 2013-02-21 NOTE — Assessment & Plan Note (Signed)
The patient has ischemic heart disease with apparently a prior MI in the late 1990 not identified on Myoview in 2011  With regional wall motion abnormalities.  Recent episode of congestive heart failure prompts the question is the progression of coronary disease and catheterization is indicated prior to consideration of ICD implantation.  There has also been, in the past, discordance in left ventricular function assessments with near simultaneous measurement ranging from 25-50%.  Left ventricular function assessment at the time of catheterization will be important. In the event that left ventricular dysfunction is present, I would recommend reduction of hydralazine/nitrates with reassessment of left ventricular function prior to making a decision of ICD implantation.  Her IVCD precludes implantation of CRT; S. ICD would be a reasonable consideration at that time.

## 2013-02-21 NOTE — Patient Instructions (Addendum)

## 2013-02-21 NOTE — Assessment & Plan Note (Signed)
She is euvolemic. We'll continue her on her diuretics.blood work 3/14 demonstrated modest bump in her creatinine. We will reassess her metabolic profile prior to proceeding with catheterization and will likely hold her diuretics.

## 2013-02-23 ENCOUNTER — Institutional Professional Consult (permissible substitution): Payer: Medicare Other | Admitting: Internal Medicine

## 2013-02-27 ENCOUNTER — Other Ambulatory Visit: Payer: Self-pay | Admitting: Internal Medicine

## 2013-02-28 ENCOUNTER — Encounter (HOSPITAL_BASED_OUTPATIENT_CLINIC_OR_DEPARTMENT_OTHER)
Admission: RE | Disposition: A | Discharge status: Home / Self Care | Payer: Self-pay | Source: Ambulatory Visit | Attending: Cardiovascular Disease

## 2013-02-28 ENCOUNTER — Encounter (HOSPITAL_BASED_OUTPATIENT_CLINIC_OR_DEPARTMENT_OTHER): Payer: Self-pay

## 2013-02-28 ENCOUNTER — Inpatient Hospital Stay (HOSPITAL_BASED_OUTPATIENT_CLINIC_OR_DEPARTMENT_OTHER)
Admission: RE | Admit: 2013-02-28 | Discharge: 2013-02-28 | Disposition: A | Discharge status: Home / Self Care | Payer: Medicare Other | Source: Ambulatory Visit | Attending: Cardiovascular Disease | Admitting: Cardiovascular Disease

## 2013-02-28 DIAGNOSIS — I2510 Atherosclerotic heart disease of native coronary artery without angina pectoris: Secondary | ICD-10-CM | POA: Insufficient documentation

## 2013-02-28 DIAGNOSIS — I509 Heart failure, unspecified: Secondary | ICD-10-CM | POA: Diagnosis not present

## 2013-02-28 DIAGNOSIS — I252 Old myocardial infarction: Secondary | ICD-10-CM | POA: Insufficient documentation

## 2013-02-28 DIAGNOSIS — I708 Atherosclerosis of other arteries: Secondary | ICD-10-CM | POA: Insufficient documentation

## 2013-02-28 DIAGNOSIS — I5022 Chronic systolic (congestive) heart failure: Secondary | ICD-10-CM | POA: Diagnosis not present

## 2013-02-28 DIAGNOSIS — I701 Atherosclerosis of renal artery: Secondary | ICD-10-CM | POA: Diagnosis not present

## 2013-02-28 DIAGNOSIS — I255 Ischemic cardiomyopathy: Secondary | ICD-10-CM

## 2013-02-28 SURGERY — JV LEFT HEART CATHETERIZATION WITH CORONARY ANGIOGRAM
Anesthesia: Moderate Sedation

## 2013-02-28 MED ORDER — DIAZEPAM 5 MG PO TABS
5.0000 mg | ORAL_TABLET | ORAL | Status: AC
  Administered 2013-02-28: 5 mg via ORAL

## 2013-02-28 MED ORDER — ACETAMINOPHEN 325 MG PO TABS: 650.0000 mg | ORAL_TABLET | ORAL | Status: DC | PRN

## 2013-02-28 MED ORDER — ONDANSETRON HCL 4 MG/2ML IJ SOLN: 4.0000 mg | Freq: Four times a day (QID) | INTRAMUSCULAR | Status: DC | PRN

## 2013-02-28 MED ORDER — SODIUM CHLORIDE 0.9 % IV SOLN: 1.0000 mL/kg/h | INTRAVENOUS | Status: DC

## 2013-02-28 NOTE — Interval H&P Note (Signed)
History and Physical Interval Note:  02/28/2013 9:23 AM  Nicole Clements  has presented today for surgery, with the diagnosis of CHF  The various methods of treatment have been discussed with the patient and family. After consideration of risks, benefits and other options for treatment, the patient has consented to  Procedure(s): JV LEFT HEART CATHETERIZATION WITH CORONARY ANGIOGRAM (N/A) as a surgical intervention .  The patient's history has been reviewed, patient examined, no change in status, stable for surgery.  I have reviewed the patient's chart and labs.  Questions were answered to the patient's satisfaction.     Tonny Bollman

## 2013-02-28 NOTE — CV Procedure (Signed)
Cardiac Catheterization Procedure Note  Name: Nicole Clements MRN: [id number] DOB: 1940/12/13  Procedure: Left Heart Cath, Selective Coronary Angiography, LV angiography, abdominal aortic angiography, right brachial artery angiography.  Indication: Known CAD, LV dysfunction, chronic systolic heart failure   Procedural details: Initially, the right radial site was prepped and draped. A 5 French sheath was inserted without difficulty. However, I was unable to pass a wire beyond the brachial artery. An angiogram was performed through a JR 4 catheter and this showed a tight stenosis in the right brachial artery. Intra-arterial nitroglycerin was administered and the stenosis was unchanged. A wire would still not pass. At that time attention was turned to the right groin. A TR band was placed for radial hemostasis. The right groin was prepped, draped, and anesthetized with 1% lidocaine. Using modified Seldinger technique, a 5 French sheath was introduced into the right femoral artery. Standard Judkins catheters were used for coronary angiography and left ventriculography. The pigtail catheter was pulled back into the suprarenal abdominal aorta where an abdominal aortogram was performed. Catheter exchanges were performed over a guidewire. There were no immediate procedural complications. The patient was transferred to the post catheterization recovery area for further monitoring.  Procedural Findings: Hemodynamics:  AO 116/45 with a mean of 70 LV 120/18   Coronary angiography: Coronary dominance: right  Left mainstem: The left main is patent with 30% distal left main stenosis as the vessel trifurcates into the LAD, intermediate branch, and left circumflex.  Left anterior descending (LAD): The LAD is patent with diffuse disease noted. There is minimal calcification present. The first diagonal is patent with 30-50% diffuse disease. The LAD after the first septal perforator has 40-50% stenosis. The  vessel reaches the apex and wraps around the left ventricular apex without significant stenosis.  Left circumflex (LCx): The intermediate branch is small in caliber and diffusely diseased with 50-60% proximal vessel stenosis the AV groove circumflex is diffusely diseased with 50% ostial stenosis and 30% mid stenosis it supplies 2 small posterolateral branch  Right coronary artery (RCA): The RCA is diffusely diseased. There is significant tortuosity, especially around the junction of the mid and distal vessel. The vessel is diffusely calcified. The mid vessel has tandem 50% stenoses. The distal vessel has 80-90 % stenosis just before the bifurcation of the PDA and posterior AV segment. The PDA and posterior AV segment with 3 posterolateral branches are patent.  Left ventriculography: Left ventricular systolic function is moderately depressed. There is severe hypokinesis of the basal and midinferior walls. The anterolateral and apical walls contract normally. The left ventricular ejection fraction is estimated at 45%.  Abdominal aortogram: The abdominal aorta is patent. The renal arteries are single bilaterally and they are widely patent. There appears to be nonobstructive stenosis of the right common iliac artery at the aortoiliac junction. The left common iliac artery appears to have significant ostial stenosis estimated at at least 75% angiographically. The visualized portions of the external iliac and common femoral arteries are patent  Final Conclusions:   1. Severe single-vessel coronary artery disease involving the distal right coronary artery 2. Moderate LV dysfunction with segmental severe hypokinesis of the inferior wall and normal contraction of the antero-apex with estimated left ventricular ejection fraction of 45%. 3. Severe left common iliac artery stenosis  Recommendations: The patient has severe distal RCA disease. She has a known history of inferior wall infarction and remote PCA. This  fits with that clinical history. She has nonobstructive disease of the left coronary  artery. Her left ventricular ejection fraction by ventriculography is clearly greater than 35%. With her history of previous MI, I do not think PCI is indicated with an absence of angina. Will carefully discussed symptoms with the patient, but I am inclined to treat her medically. Will also review indication for consideration of peripheral intervention for her iliac disease.  Tonny Bollman 02/28/2013, 10:21 AM

## 2013-02-28 NOTE — OR Nursing (Signed)
Discharge instructions reviewed and signed, pt stated understanding, ambulated in hall without difficulty, site level 0, transported to brother's car via wheelchair 

## 2013-02-28 NOTE — OR Nursing (Signed)
Tegaderm dressing applied, site level 0, bedrest begins at 1025 

## 2013-02-28 NOTE — OR Nursing (Signed)
Meal served 

## 2013-02-28 NOTE — OR Nursing (Signed)
TR band removed, site level 0, pressure dressing and wrist splint applied, distal circulation intact 

## 2013-02-28 NOTE — OR Nursing (Signed)
+  Allen's test right hand 

## 2013-02-28 NOTE — H&P (View-Only) (Signed)
ELECTROPHYSIOLOGY CONSULT NOTE  Patient ID: Nicole Clements, MRN: [id number], DOB/AGE: 70-Mar-1942 70 y.o. Admit date: (Not on file) Date of Consult: 02/21/2013  Primary Physician: Lupita Raider, MD Primary Cardiologist: *TW  Chief Complaint: ICD    HPI Nicole Clements is a 72 y.o. female  Seen at The request of Dr. Daleen Squibb for consideration of ICD implantation.  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 Echocardiogram 3/11 demonstrated ejection fraction of 45-50% Myoview scan 3/11 demonstrated ejection fraction of 29%with no clear or prior infarction. There is hypokinesis of the inferior wall;      she was recently hospitalized for congestive heart failure and at that time her ejection fraction was 25%.;  Prior to that she had symptoms of dyspnea on exertion, edema of hands and feet, orthopnea and nocturnal dyspnea. Diuretics were initiated and had been maintained and the symptoms have essentially resolved.  Her last catheterization was 1996 at which time she had angioplasty. These records are not available. She has had no recent evaluation of myocardial perfusion. She's had no chest pain.  She does have occasional palpitations. She's had no syncope     Past Medical History  Diagnosis Date  . Myocardial infarct 07/22/1996    st.elevated inferior wall infarct  . CAD (coronary artery disease)   . Hypertension   . Hypercholesterolemia   . S/P colonoscopy August 2007    hyperplastic polyps, rare sigmoid and descending colon diverticulosis, small internal hemorrhoids  . CHF (congestive heart failure) 12/31/2012      Surgical History:  Past Surgical History  Procedure Laterality Date  . Cardiac catheterization    . S/p hysterectomy       Home Meds: Prior to Admission medications   Medication Sig Start Date End Date Taking? Authorizing Provider  aspirin 325 MG tablet Take 325 mg by mouth daily.     Yes Historical Provider, MD  Calcium Citrate  (CITRACAL PO) Take 1 tablet by mouth daily.     Yes Historical Provider, MD  carvedilol (COREG) 12.5 MG tablet Take 1 tablet (12.5 mg total) by mouth 2 (two) times daily. 11/22/12  Yes Gaylord Shih, MD  Cholecalciferol (VITAMIN D3) 2000 UNITS capsule Take 2,000 Units by mouth daily.     Yes Historical Provider, MD  furosemide (LASIX) 40 MG tablet Take 1 tablet (40 mg total) by mouth daily. 01/26/13  Yes Gaylord Shih, MD  losartan (COZAAR) 100 MG tablet Take 1 tablet (100 mg total) by mouth daily. 11/22/12  Yes Gaylord Shih, MD  Omega-3 Fatty Acids (FISH OIL) 1000 MG CAPS Take 1 capsule by mouth daily.     Yes Historical Provider, MD  potassium chloride SA (K-DUR,KLOR-CON) 20 MEQ tablet Take 1 tablet (20 mEq total) by mouth daily. 01/26/13  Yes Gaylord Shih, MD  simvastatin (ZOCOR) 40 MG tablet Take 1 tablet (40 mg total) by mouth at bedtime. 10/06/12  Yes Gaylord Shih, MD    Allergies:  Allergies  Allergen Reactions  . Sulfa Antibiotics Rash    History   Social History  . Marital Status: Single    Spouse Name: N/A    Number of Children: 1  . Years of Education: N/A   Occupational History  . retired     Clinical biochemist rep   Social History Main Topics  . Smoking status: Current Every Day Smoker -- 0.50 packs/day for 50 years  . Smokeless tobacco: Never Used     Comment:  down to a half a pack daily for the past month   . Alcohol Use: No  . Drug Use: No  . Sexually Active: Not on file   Other Topics Concern  . Not on file   Social History Narrative  . No narrative on file     Family History  Problem Relation Age of Onset  . Colon cancer Neg Hx   . Hypertension Sister   . Diabetes Mellitus II Sister   . Hypertension Brother   . Diabetes Mellitus II Brother   . Hypertension Brother   . Diabetes Mellitus II Brother   . Hypertension Brother   . Diabetes Mellitus II Brother      ROS:  Please see the history of present illness.   Negative except  palps  All other  systems reviewed and negative.    Physical Exam:   Blood pressure 147/72, pulse 61, height 5\' 3"  (1.6 m), weight 166 lb 9.6 oz (75.569 kg). General: Well developed, well nourished female in no acute distress. Head: Normocephalic, atraumatic, sclera non-icteric, no xanthomas, nares are without discharge. EENT: normal Lymph Nodes:  none Back: without scoliosis/kyphosis , no CVA tendersness Neck: Negative for carotid bruits. JVD 6-7 Lungs: Clear bilaterally to auscultation without wheezes, rales, or rhonchi. Breathing is unlabored. Heart: RRR with S1 S2.+ s4 murmur , rubs, or gallops appreciated. Abdomen: Soft, non-tender, non-distended with normoactive bowel sounds. No hepatomegaly. No rebound/guarding. No obvious abdominal masses. Msk:  Strength and tone appear normal for age. Extremities: No clubbing or cyanosis. No edema  +  edema.  Distal pedal pulses are 2+ and equal bilaterally. Skin: Warm and Dry Neuro: Alert and oriented X 3. CN III-XII intact Grossly normal sensory and motor function . Psych:  Responds to questions appropriately with a normal affect.      Labs: Cardiac Enzymes No results found for this basename: CKTOTAL, CKMB, TROPONINI,  in the last 72 hours CBC Lab Results  Component Value Date   WBC 9.5 01/01/2013   HGB 14.2 01/01/2013   HCT 44.8 01/01/2013   MCV 89.1 01/01/2013   PLT 255 01/01/2013   PROTIME: No results found for this basename: LABPROT, INR,  in the last 72 hours Chemistry No results found for this basename: NA, K, CL, CO2, BUN, CREATININE, CALCIUM, LABALBU, PROT, BILITOT, ALKPHOS, ALT, AST, GLUCOSE,  in the last 168 hours Lipids Lab Results  Component Value Date   CHOL 133 12/03/2012   HDL 34* 12/03/2012   LDLCALC 75 12/03/2012   TRIG 121 12/03/2012   BNP Pro B Natriuretic peptide (BNP)  Date/Time Value Range Status  01/02/2013  5:21 AM 2144.0* 0 - 125 pg/mL Final  12/31/2012  3:42 PM 6101.0* 0 - 125 pg/mL Final   Miscellaneous No results found for  this basename: DDIMER    Radiology/Studies:  No results found.  EKG:  Sinus rhtym with nonspecific IVCD. She has a QS in V1 but rS and V6 as well as 1 and L.   Assessment and Plan:    Sherryl Manges

## 2013-02-28 NOTE — OR Nursing (Signed)
Dr Cooper at bedside to discuss results and treatment plan with pt and family 

## 2013-04-05 ENCOUNTER — Encounter: Payer: Self-pay | Admitting: Cardiology

## 2013-04-05 ENCOUNTER — Ambulatory Visit (INDEPENDENT_AMBULATORY_CARE_PROVIDER_SITE_OTHER): Payer: Medicare Other | Admitting: Cardiology

## 2013-04-05 VITALS — BP 130/70 | HR 67 | Ht 63.0 in | Wt 169.0 lb

## 2013-04-05 DIAGNOSIS — I4949 Other premature depolarization: Secondary | ICD-10-CM

## 2013-04-05 DIAGNOSIS — I5022 Chronic systolic (congestive) heart failure: Secondary | ICD-10-CM

## 2013-04-05 DIAGNOSIS — I472 Ventricular tachycardia: Secondary | ICD-10-CM | POA: Insufficient documentation

## 2013-04-05 DIAGNOSIS — I2589 Other forms of chronic ischemic heart disease: Secondary | ICD-10-CM | POA: Diagnosis not present

## 2013-04-05 DIAGNOSIS — I2510 Atherosclerotic heart disease of native coronary artery without angina pectoris: Secondary | ICD-10-CM | POA: Diagnosis not present

## 2013-04-05 DIAGNOSIS — I255 Ischemic cardiomyopathy: Secondary | ICD-10-CM

## 2013-04-05 DIAGNOSIS — F17200 Nicotine dependence, unspecified, uncomplicated: Secondary | ICD-10-CM | POA: Diagnosis not present

## 2013-04-05 DIAGNOSIS — I493 Ventricular premature depolarization: Secondary | ICD-10-CM

## 2013-04-05 NOTE — Assessment & Plan Note (Signed)
She is stable. She does not need a defibrillator. Return the office in one year.

## 2013-04-05 NOTE — Assessment & Plan Note (Signed)
She must stop smoking. She has progressive coronary disease as well as now peripheral vascular disease which is asymptomatic. I've offered her Wellbutrin

## 2013-04-05 NOTE — Assessment & Plan Note (Signed)
EF is 45%. Continue carvedilol.

## 2013-04-05 NOTE — Progress Notes (Signed)
HPI Nicole Clements returns today for evaluation and management of her coronary artery disease and mild left ventricular systolic dysfunction. Catheterization performed in April as an outpatient showed severe distal right coronary artery disease that has been managed medically. She also is a 75% stenosis in the left iliac but has no claudication. Her EF was 45% since she does not need defibrillator. She unfortunately continues to smoke.  Past Medical History  Diagnosis Date  . Myocardial infarct 07/22/1996    st.elevated inferior [name] infarct  . CAD (coronary artery disease)   . Hypertension   . Hypercholesterolemia   . S/P colonoscopy August 2007    hyperplastic polyps, rare sigmoid and descending colon diverticulosis, small internal hemorrhoids  . CHF (congestive heart failure) 12/31/2012    Current Outpatient Prescriptions  Medication Sig Dispense Refill  . aspirin 325 MG tablet Take 325 mg by mouth daily.        . Calcium Citrate (CITRACAL PO) Take 1 tablet by mouth daily.        . carvedilol (COREG) 12.5 MG tablet Take 1 tablet (12.5 mg total) by mouth 2 (two) times daily.  60 tablet  5  . Cholecalciferol (VITAMIN D3) 2000 UNITS capsule Take 2,000 Units by mouth daily.        . furosemide (LASIX) 40 MG tablet Take 1 tablet (40 mg total) by mouth daily.  45 tablet  6  . losartan (COZAAR) 100 MG tablet Take 1 tablet (100 mg total) by mouth daily.  30 tablet  5  . Omega-3 Fatty Acids (FISH OIL) 1000 MG CAPS Take 1 capsule by mouth daily.        . potassium chloride SA (K-DUR,KLOR-CON) 20 MEQ tablet Take 1 tablet (20 mEq total) by mouth daily.  45 tablet  6  . simvastatin (ZOCOR) 40 MG tablet Take 1 tablet (40 mg total) by mouth at bedtime.  90 tablet  3   No current facility-administered medications for this visit.    Allergies  Allergen Reactions  . Sulfa Antibiotics Rash    Family History  Problem Relation Age of Onset  . Colon cancer Neg Hx   . Hypertension Sister   . Diabetes  Mellitus II Sister   . Hypertension Brother   . Diabetes Mellitus II Brother   . Hypertension Brother   . Diabetes Mellitus II Brother   . Hypertension Brother   . Diabetes Mellitus II Brother     History   Social History  . Marital Status: Single    Spouse Name: N/A    Number of Children: 1  . Years of Education: N/A   Occupational History  . retired     Clinical biochemist rep   Social History Main Topics  . Smoking status: Current Every Day Smoker -- 0.50 packs/day for 50 years  . Smokeless tobacco: Never Used     Comment: down to a half a pack daily for the past month   . Alcohol Use: No  . Drug Use: No  . Sexually Active: Not on file   Other Topics Concern  . Not on file   Social History Narrative  . No narrative on file    ROS ALL NEGATIVE EXCEPT THOSE NOTED IN HPI  PE  General Appearance: well developed, well nourished in no acute distress HEENT: symmetrical face, PERRLA, good dentition  Neck: no JVD, thyromegaly, or adenopathy, trachea midline Chest: symmetric without deformity Cardiac: PMI non-displaced, RRR, normal S1, S2, no gallop or murmur Lung: clear  to ausculation and percussion Vascular: Decreased pulses in the lower extremities 1+ over 4+ in the left lower extremity. Abdominal: nondistended, nontender, good bowel sounds, no HSM, no bruits Extremities: no cyanosis, clubbing or edema, no sign of DVT, no varicosities  Skin: normal color, no rashes Neuro: alert and oriented x 3, non-focal Pysch: normal affect  EKG Normal sinus rhythm, ST segment flattening inferior laterally with T wave inversion laterally, frequent unifocal PVCs. BMET    Component Value Date/Time   NA 139 02/21/2013 1541   K 4.0 02/21/2013 1541   CL 103 02/21/2013 1541   CO2 28 02/21/2013 1541   GLUCOSE 90 02/21/2013 1541   BUN 13 02/21/2013 1541   CREATININE 0.9 02/21/2013 1541   CREATININE 1.24* 01/26/2013 1430   CALCIUM 9.5 02/21/2013 1541   GFRNONAA 67* 01/03/2013 0607   GFRAA  78* 01/03/2013 0607    Lipid Panel     Component Value Date/Time   CHOL 133 12/03/2012 0915   TRIG 121 12/03/2012 0915   HDL 34* 12/03/2012 0915   CHOLHDL 3.9 12/03/2012 0915   VLDL 24 12/03/2012 0915   LDLCALC 75 12/03/2012 0915    CBC    Component Value Date/Time   WBC 7.7 02/21/2013 1541   RBC 5.49* 02/21/2013 1541   HGB 15.4* 02/21/2013 1541   HCT 47.3* 02/21/2013 1541   PLT 188.0 02/21/2013 1541   MCV 86.3 02/21/2013 1541   MCH 28.2 01/01/2013 0457   MCHC 32.6 02/21/2013 1541   RDW 15.4* 02/21/2013 1541   LYMPHSABS 4.0 02/21/2013 1541   MONOABS 0.6 02/21/2013 1541   EOSABS 0.2 02/21/2013 1541   BASOSABS 0.0 02/21/2013 1541

## 2013-04-05 NOTE — Assessment & Plan Note (Signed)
Stable. Continue medical therapy for distal right coronary vessel disease which is not amenable to PCI. Followup in one year.

## 2013-04-05 NOTE — Patient Instructions (Addendum)
Your physician wants you to follow-up in: 1 YEAR with Dr Branch.  You will receive a reminder letter in the mail two months in advance. If you don't receive a letter, please call our office to schedule the follow-up appointment.  Your physician recommends that you continue on your current medications as directed. Please refer to the Current Medication list given to you today.  

## 2013-05-02 ENCOUNTER — Other Ambulatory Visit (HOSPITAL_COMMUNITY): Payer: Self-pay | Admitting: Advanced Practice Midwife

## 2013-05-02 ENCOUNTER — Other Ambulatory Visit (HOSPITAL_COMMUNITY): Payer: Self-pay | Admitting: Family Medicine

## 2013-05-02 DIAGNOSIS — Z139 Encounter for screening, unspecified: Secondary | ICD-10-CM

## 2013-05-03 ENCOUNTER — Ambulatory Visit (HOSPITAL_COMMUNITY)
Admission: RE | Admit: 2013-05-03 | Discharge: 2013-05-03 | Disposition: A | Discharge status: Home / Self Care | Payer: Medicare Other | Source: Ambulatory Visit | Attending: Family Medicine | Admitting: Family Medicine

## 2013-05-03 DIAGNOSIS — Z139 Encounter for screening, unspecified: Secondary | ICD-10-CM

## 2013-05-03 DIAGNOSIS — Z1231 Encounter for screening mammogram for malignant neoplasm of breast: Secondary | ICD-10-CM | POA: Insufficient documentation

## 2013-06-16 ENCOUNTER — Other Ambulatory Visit: Payer: Self-pay

## 2013-06-16 MED ORDER — CARVEDILOL 12.5 MG PO TABS: 12.5000 mg | ORAL_TABLET | Freq: Two times a day (BID) | ORAL | Status: DC

## 2013-07-19 ENCOUNTER — Telehealth: Payer: Self-pay | Admitting: *Deleted

## 2013-07-19 MED ORDER — LOSARTAN POTASSIUM 100 MG PO TABS: 100.0000 mg | ORAL_TABLET | Freq: Every day | ORAL | Status: DC

## 2013-07-19 NOTE — Telephone Encounter (Signed)
Pt states that [facility] has faxed [place] twice for refill on losartin. Pt needs [place] to call this in today.

## 2013-07-19 NOTE — Telephone Encounter (Signed)
rx sent to pharmacy by e-script via escribe

## 2013-07-20 ENCOUNTER — Other Ambulatory Visit: Payer: Self-pay | Admitting: *Deleted

## 2013-07-20 MED ORDER — LOSARTAN POTASSIUM 100 MG PO TABS: 100.0000 mg | ORAL_TABLET | Freq: Every day | ORAL | Status: DC

## 2013-08-09 DIAGNOSIS — Z23 Encounter for immunization: Secondary | ICD-10-CM | POA: Diagnosis not present

## 2013-11-01 ENCOUNTER — Telehealth: Payer: Self-pay | Admitting: Cardiology

## 2013-11-01 MED ORDER — SIMVASTATIN 40 MG PO TABS: 40.0000 mg | ORAL_TABLET | Freq: Every day | ORAL | Status: DC

## 2013-11-01 NOTE — Telephone Encounter (Signed)
Received fax refill request  Rx # [id number] Medication:  Simvastatin 40 mg tablet Qty 90 Sig:  Take one tablet by mouth at bedtime Physician:  Daleen Squibb

## 2013-11-01 NOTE — Telephone Encounter (Signed)
rx to pharmacy 

## 2013-12-27 ENCOUNTER — Telehealth: Payer: Self-pay | Admitting: Cardiology

## 2013-12-27 MED ORDER — FUROSEMIDE 40 MG PO TABS: 40.0000 mg | ORAL_TABLET | Freq: Every day | ORAL | Status: DC

## 2013-12-27 MED ORDER — POTASSIUM CHLORIDE CRYS ER 20 MEQ PO TBCR: 20.0000 meq | EXTENDED_RELEASE_TABLET | Freq: Every day | ORAL | Status: DC

## 2013-12-27 NOTE — Telephone Encounter (Signed)
Received fax refill request  Rx # [id number] Medication:  Potassium CL ER 20 meq tablet Qty 45 Sig:  Take one tablet by mouth once daily Physician:  Wall Received fax refill request  Rx # [id number] Medication:  Furosemide 40 mg tablet Qty 45 Sig:  Take one tablet by mouth once daily Physician:  Daleen Squibb

## 2013-12-27 NOTE — Telephone Encounter (Signed)
done

## 2014-05-08 ENCOUNTER — Telehealth: Payer: Self-pay | Admitting: Cardiology

## 2014-05-08 MED ORDER — POTASSIUM CHLORIDE CRYS ER 20 MEQ PO TBCR: 20.0000 meq | EXTENDED_RELEASE_TABLET | Freq: Every day | ORAL | Status: DC

## 2014-05-08 NOTE — Telephone Encounter (Signed)
Refill sent to pharmacy.   

## 2014-05-08 NOTE — Telephone Encounter (Signed)
Received fax refill request  Rx # [id number] Medication:  Potassium CL ER 20 meq tablet Qty 30 Sig:  Take one tablet by mouth once daily  Physician:  Daleen Squibb

## 2014-05-18 ENCOUNTER — Telehealth: Payer: Self-pay | Admitting: Cardiology

## 2014-05-18 MED ORDER — FUROSEMIDE 40 MG PO TABS: 40.0000 mg | ORAL_TABLET | Freq: Every day | ORAL | Status: DC

## 2014-05-18 NOTE — Telephone Encounter (Signed)
Medication sent via escribe for 30 days

## 2014-05-18 NOTE — Telephone Encounter (Signed)
Received fax refill request  Rx # [id number] Medication:  Furosemide 40 mg tablet Qty 30 Sig:  Take one tablet by mouth once daily Physician:  Daleen Squibb

## 2014-06-01 ENCOUNTER — Other Ambulatory Visit (HOSPITAL_COMMUNITY): Payer: Self-pay | Admitting: Family Medicine

## 2014-06-01 DIAGNOSIS — Z1231 Encounter for screening mammogram for malignant neoplasm of breast: Secondary | ICD-10-CM

## 2014-06-05 ENCOUNTER — Ambulatory Visit (HOSPITAL_COMMUNITY)
Admission: RE | Admit: 2014-06-05 | Discharge: 2014-06-05 | Disposition: A | Discharge status: Home / Self Care | Payer: Medicare Other | Source: Ambulatory Visit | Attending: Family Medicine | Admitting: Family Medicine

## 2014-06-05 DIAGNOSIS — Z1231 Encounter for screening mammogram for malignant neoplasm of breast: Secondary | ICD-10-CM | POA: Insufficient documentation

## 2014-06-26 ENCOUNTER — Telehealth: Payer: Self-pay | Admitting: Cardiology

## 2014-06-26 MED ORDER — CARVEDILOL 12.5 MG PO TABS: 12.5000 mg | ORAL_TABLET | Freq: Two times a day (BID) | ORAL | Status: DC

## 2014-06-26 NOTE — Telephone Encounter (Signed)
Received fax refill request  Rx # [id number] Medication:  Carvedilol 12.5 mg tablet Qty 60 Sig:  Take one tablet twice a day Physician:  Daleen Squibb

## 2014-06-26 NOTE — Telephone Encounter (Signed)
1 month refill only overdue to establish with new cardiology   Sent message to Sima Matas to sched apt

## 2014-07-12 ENCOUNTER — Encounter: Payer: Medicare Other | Admitting: Cardiology

## 2014-07-12 ENCOUNTER — Emergency Department (HOSPITAL_COMMUNITY): Payer: Medicare Other

## 2014-07-12 ENCOUNTER — Inpatient Hospital Stay (HOSPITAL_COMMUNITY)
Admission: EM | Admit: 2014-07-12 | Discharge: 2014-07-19 | DRG: 246 | Disposition: A | Discharge status: Home / Self Care | Payer: Medicare Other | Attending: Internal Medicine | Admitting: Internal Medicine

## 2014-07-12 ENCOUNTER — Encounter (HOSPITAL_COMMUNITY): Payer: Self-pay | Admitting: Cardiology

## 2014-07-12 DIAGNOSIS — I498 Other specified cardiac arrhythmias: Secondary | ICD-10-CM | POA: Diagnosis present

## 2014-07-12 DIAGNOSIS — I252 Old myocardial infarction: Secondary | ICD-10-CM | POA: Diagnosis not present

## 2014-07-12 DIAGNOSIS — I472 Ventricular tachycardia, unspecified: Secondary | ICD-10-CM | POA: Diagnosis present

## 2014-07-12 DIAGNOSIS — I459 Conduction disorder, unspecified: Secondary | ICD-10-CM | POA: Diagnosis present

## 2014-07-12 DIAGNOSIS — I509 Heart failure, unspecified: Secondary | ICD-10-CM | POA: Diagnosis present

## 2014-07-12 DIAGNOSIS — I5042 Chronic combined systolic (congestive) and diastolic (congestive) heart failure: Secondary | ICD-10-CM

## 2014-07-12 DIAGNOSIS — R0602 Shortness of breath: Secondary | ICD-10-CM | POA: Diagnosis not present

## 2014-07-12 DIAGNOSIS — I428 Other cardiomyopathies: Secondary | ICD-10-CM | POA: Diagnosis not present

## 2014-07-12 DIAGNOSIS — I2589 Other forms of chronic ischemic heart disease: Secondary | ICD-10-CM | POA: Diagnosis present

## 2014-07-12 DIAGNOSIS — I25110 Atherosclerotic heart disease of native coronary artery with unstable angina pectoris: Secondary | ICD-10-CM

## 2014-07-12 DIAGNOSIS — Z7982 Long term (current) use of aspirin: Secondary | ICD-10-CM | POA: Diagnosis not present

## 2014-07-12 DIAGNOSIS — R7309 Other abnormal glucose: Secondary | ICD-10-CM | POA: Diagnosis present

## 2014-07-12 DIAGNOSIS — J810 Acute pulmonary edema: Secondary | ICD-10-CM | POA: Diagnosis not present

## 2014-07-12 DIAGNOSIS — I10 Essential (primary) hypertension: Secondary | ICD-10-CM | POA: Diagnosis present

## 2014-07-12 DIAGNOSIS — I2510 Atherosclerotic heart disease of native coronary artery without angina pectoris: Secondary | ICD-10-CM | POA: Diagnosis not present

## 2014-07-12 DIAGNOSIS — E872 Acidosis, unspecified: Secondary | ICD-10-CM | POA: Diagnosis present

## 2014-07-12 DIAGNOSIS — I5023 Acute on chronic systolic (congestive) heart failure: Secondary | ICD-10-CM | POA: Diagnosis not present

## 2014-07-12 DIAGNOSIS — I5043 Acute on chronic combined systolic (congestive) and diastolic (congestive) heart failure: Secondary | ICD-10-CM | POA: Diagnosis present

## 2014-07-12 DIAGNOSIS — J9601 Acute respiratory failure with hypoxia: Secondary | ICD-10-CM | POA: Diagnosis present

## 2014-07-12 DIAGNOSIS — I4729 Other ventricular tachycardia: Secondary | ICD-10-CM | POA: Diagnosis present

## 2014-07-12 DIAGNOSIS — I5021 Acute systolic (congestive) heart failure: Secondary | ICD-10-CM | POA: Diagnosis present

## 2014-07-12 DIAGNOSIS — E785 Hyperlipidemia, unspecified: Secondary | ICD-10-CM | POA: Diagnosis present

## 2014-07-12 DIAGNOSIS — Z955 Presence of coronary angioplasty implant and graft: Secondary | ICD-10-CM

## 2014-07-12 DIAGNOSIS — J449 Chronic obstructive pulmonary disease, unspecified: Secondary | ICD-10-CM | POA: Diagnosis present

## 2014-07-12 DIAGNOSIS — Z6829 Body mass index (BMI) 29.0-29.9, adult: Secondary | ICD-10-CM | POA: Diagnosis not present

## 2014-07-12 DIAGNOSIS — I255 Ischemic cardiomyopathy: Secondary | ICD-10-CM | POA: Diagnosis present

## 2014-07-12 DIAGNOSIS — F17200 Nicotine dependence, unspecified, uncomplicated: Secondary | ICD-10-CM | POA: Diagnosis present

## 2014-07-12 DIAGNOSIS — R0989 Other specified symptoms and signs involving the circulatory and respiratory systems: Secondary | ICD-10-CM | POA: Diagnosis not present

## 2014-07-12 DIAGNOSIS — J9600 Acute respiratory failure, unspecified whether with hypoxia or hypercapnia: Secondary | ICD-10-CM | POA: Diagnosis present

## 2014-07-12 DIAGNOSIS — I2582 Chronic total occlusion of coronary artery: Secondary | ICD-10-CM | POA: Diagnosis present

## 2014-07-12 DIAGNOSIS — J4489 Other specified chronic obstructive pulmonary disease: Secondary | ICD-10-CM | POA: Diagnosis present

## 2014-07-12 DIAGNOSIS — Z8249 Family history of ischemic heart disease and other diseases of the circulatory system: Secondary | ICD-10-CM

## 2014-07-12 DIAGNOSIS — I059 Rheumatic mitral valve disease, unspecified: Secondary | ICD-10-CM | POA: Diagnosis not present

## 2014-07-12 DIAGNOSIS — R0609 Other forms of dyspnea: Secondary | ICD-10-CM | POA: Diagnosis not present

## 2014-07-12 HISTORY — DX: Nonspecific intraventricular block: I45.4

## 2014-07-12 HISTORY — DX: Ventricular premature depolarization: I49.3

## 2014-07-12 HISTORY — DX: Ischemic cardiomyopathy: I25.5

## 2014-07-12 LAB — URINE MICROSCOPIC-ADD ON

## 2014-07-12 LAB — URINALYSIS, ROUTINE W REFLEX MICROSCOPIC
Bilirubin Urine: NEGATIVE
GLUCOSE, UA: NEGATIVE mg/dL
Ketones, ur: NEGATIVE mg/dL
LEUKOCYTES UA: NEGATIVE
Nitrite: NEGATIVE
PH: 6 (ref 5.0–8.0)
SPECIFIC GRAVITY, URINE: 1.01 (ref 1.005–1.030)
Urobilinogen, UA: 0.2 mg/dL (ref 0.0–1.0)

## 2014-07-12 LAB — BLOOD GAS, ARTERIAL
Acid-base deficit: 7.1 mmol/L — ABNORMAL HIGH (ref 0.0–2.0)
BICARBONATE: 20.1 meq/L (ref 20.0–24.0)
Delivery systems: POSITIVE
Drawn by: 27407
EXPIRATORY PAP: 5
FIO2: 1 %
Inspiratory PAP: 10
O2 SAT: 87.2 %
PCO2 ART: 57.4 mmHg — AB (ref 35.0–45.0)
Patient temperature: 37
TCO2: 18.8 mmol/L (ref 0–100)
pH, Arterial: 7.171 — CL (ref 7.350–7.450)
pO2, Arterial: 70.8 mmHg — ABNORMAL LOW (ref 80.0–100.0)

## 2014-07-12 LAB — CBC
HCT: 48.1 % — ABNORMAL HIGH (ref 36.0–46.0)
HCT: 42.3 % (ref 36.0–46.0)
Hemoglobin: 15.4 g/dL — ABNORMAL HIGH (ref 12.0–15.0)
Hemoglobin: 13.6 g/dL (ref 12.0–15.0)
MCH: 29.6 pg (ref 26.0–34.0)
MCH: 28.9 pg (ref 26.0–34.0)
MCHC: 32 g/dL (ref 30.0–36.0)
MCHC: 32.2 g/dL (ref 30.0–36.0)
MCV: 92.5 fL (ref 78.0–100.0)
MCV: 89.8 fL (ref 78.0–100.0)
PLATELETS: 220 10*3/uL (ref 150–400)
Platelets: 197 10*3/uL (ref 150–400)
RBC: 5.2 MIL/uL — AB (ref 3.87–5.11)
RBC: 4.71 MIL/uL (ref 3.87–5.11)
RDW: 13.9 % (ref 11.5–15.5)
RDW: 13.6 % (ref 11.5–15.5)
WBC: 11.7 10*3/uL — AB (ref 4.0–10.5)
WBC: 9 10*3/uL (ref 4.0–10.5)

## 2014-07-12 LAB — I-STAT TROPONIN, ED: Troponin i, poc: 0.03 ng/mL (ref 0.00–0.08)

## 2014-07-12 LAB — BASIC METABOLIC PANEL
Anion gap: 16 — ABNORMAL HIGH (ref 5–15)
BUN: 10 mg/dL (ref 6–23)
CALCIUM: 8.8 mg/dL (ref 8.4–10.5)
CO2: 20 mEq/L (ref 19–32)
Chloride: 103 mEq/L (ref 96–112)
Creatinine, Ser: 0.96 mg/dL (ref 0.50–1.10)
GFR calc Af Amer: 67 mL/min — ABNORMAL LOW (ref >90)
GFR, EST NON AFRICAN AMERICAN: 58 mL/min — AB (ref >90)
GLUCOSE: 265 mg/dL — AB (ref 70–99)
Potassium: 4.4 mEq/L (ref 3.7–5.3)
SODIUM: 139 meq/L (ref 137–147)

## 2014-07-12 LAB — I-STAT CHEM 8, ED
BUN: 9 mg/dL (ref 6–23)
CHLORIDE: 105 meq/L (ref 96–112)
Calcium, Ion: 1.17 mmol/L (ref 1.13–1.30)
Creatinine, Ser: 0.9 mg/dL (ref 0.50–1.10)
Glucose, Bld: 228 mg/dL — ABNORMAL HIGH (ref 70–99)
HCT: 47 % — ABNORMAL HIGH (ref 36.0–46.0)
Hemoglobin: 16 g/dL — ABNORMAL HIGH (ref 12.0–15.0)
Potassium: 3.7 mEq/L (ref 3.7–5.3)
Sodium: 140 mEq/L (ref 137–147)
TCO2: 22 mmol/L (ref 0–100)

## 2014-07-12 LAB — CREATININE, SERUM
Creatinine, Ser: 0.9 mg/dL (ref 0.50–1.10)
GFR calc Af Amer: 73 mL/min — ABNORMAL LOW (ref >90)
GFR calc non Af Amer: 63 mL/min — ABNORMAL LOW (ref >90)

## 2014-07-12 LAB — TROPONIN I: Troponin I: <0.3 ng/mL (ref <0.30)

## 2014-07-12 LAB — PROTIME-INR
INR: 1.25 (ref 0.00–1.49)
Prothrombin Time: 15.7 seconds — ABNORMAL HIGH (ref 11.6–15.2)

## 2014-07-12 LAB — TSH: TSH: 0.81 u[IU]/mL (ref 0.350–4.500)

## 2014-07-12 LAB — PRO B NATRIURETIC PEPTIDE: PRO B NATRI PEPTIDE: 8927 pg/mL — AB (ref 0–125)

## 2014-07-12 LAB — LACTIC ACID, PLASMA: Lactic Acid, Venous: 1.8 mmol/L (ref 0.5–2.2)

## 2014-07-12 LAB — MAGNESIUM: Magnesium: 2 mg/dL (ref 1.5–2.5)

## 2014-07-12 LAB — MRSA PCR SCREENING: MRSA by PCR: NEGATIVE

## 2014-07-12 MED ORDER — LEVALBUTEROL HCL 0.63 MG/3ML IN NEBU
0.6300 mg | INHALATION_SOLUTION | Freq: Four times a day (QID) | RESPIRATORY_TRACT | Status: DC | PRN
  Administered 2014-07-12 – 2014-07-14 (×2): 0.63 mg via RESPIRATORY_TRACT
  Filled 2014-07-12 (×2): qty 3

## 2014-07-12 MED ORDER — FUROSEMIDE 10 MG/ML IJ SOLN
40.0000 mg | Freq: Once | INTRAMUSCULAR | Status: AC
  Administered 2014-07-12: 40 mg via INTRAVENOUS

## 2014-07-12 MED ORDER — FUROSEMIDE 10 MG/ML IJ SOLN
80.0000 mg | Freq: Two times a day (BID) | INTRAMUSCULAR | Status: AC
  Administered 2014-07-12 – 2014-07-14 (×5): 80 mg via INTRAVENOUS
  Filled 2014-07-12 (×6): qty 8

## 2014-07-12 MED ORDER — ETOMIDATE 2 MG/ML IV SOLN
INTRAVENOUS | Status: AC
  Filled 2014-07-12: qty 20

## 2014-07-12 MED ORDER — SIMVASTATIN 40 MG PO TABS
40.0000 mg | ORAL_TABLET | Freq: Every day | ORAL | Status: DC
  Administered 2014-07-13 – 2014-07-18 (×6): 40 mg via ORAL
  Filled 2014-07-12 (×7): qty 1

## 2014-07-12 MED ORDER — ALBUTEROL (5 MG/ML) CONTINUOUS INHALATION SOLN
15.0000 mg/h | INHALATION_SOLUTION | Freq: Once | RESPIRATORY_TRACT | Status: AC
  Administered 2014-07-12: 15 mg/h via RESPIRATORY_TRACT

## 2014-07-12 MED ORDER — ONDANSETRON HCL 4 MG/2ML IJ SOLN
4.0000 mg | Freq: Once | INTRAMUSCULAR | Status: AC
  Administered 2014-07-12: 4 mg via INTRAMUSCULAR

## 2014-07-12 MED ORDER — ZOLPIDEM TARTRATE 5 MG PO TABS: 5.0000 mg | ORAL_TABLET | Freq: Every evening | ORAL | Status: DC | PRN

## 2014-07-12 MED ORDER — ASPIRIN EC 81 MG PO TBEC
81.0000 mg | DELAYED_RELEASE_TABLET | Freq: Every day | ORAL | Status: DC
  Administered 2014-07-13 – 2014-07-19 (×6): 81 mg via ORAL
  Filled 2014-07-12 (×8): qty 1

## 2014-07-12 MED ORDER — NITROGLYCERIN IN D5W 200-5 MCG/ML-% IV SOLN
5.0000 ug/min | Freq: Once | INTRAVENOUS | Status: AC
  Administered 2014-07-12: 10 ug/min via INTRAVENOUS

## 2014-07-12 MED ORDER — AMIODARONE HCL 150 MG/3ML IV SOLN
INTRAVENOUS | Status: AC
Start: 2014-07-12 — End: 2014-07-13
  Filled 2014-07-12: qty 3

## 2014-07-12 MED ORDER — ASPIRIN 81 MG PO CHEW
324.0000 mg | CHEWABLE_TABLET | ORAL | Status: AC
Start: 2014-07-12 — End: 2014-07-12
  Administered 2014-07-12: 324 mg via ORAL
  Filled 2014-07-12: qty 4

## 2014-07-12 MED ORDER — AMIODARONE HCL IN DEXTROSE 360-4.14 MG/200ML-% IV SOLN
60.0000 mg/h | INTRAVENOUS | Status: AC
  Administered 2014-07-12: 60 mg/h via INTRAVENOUS

## 2014-07-12 MED ORDER — LIDOCAINE HCL (CARDIAC) 20 MG/ML IV SOLN
INTRAVENOUS | Status: AC
  Filled 2014-07-12: qty 5

## 2014-07-12 MED ORDER — ROCURONIUM BROMIDE 50 MG/5ML IV SOLN
INTRAVENOUS | Status: AC
  Filled 2014-07-12: qty 2

## 2014-07-12 MED ORDER — NITROGLYCERIN IN D5W 200-5 MCG/ML-% IV SOLN
INTRAVENOUS | Status: AC
  Filled 2014-07-12: qty 250

## 2014-07-12 MED ORDER — ENOXAPARIN SODIUM 40 MG/0.4ML ~~LOC~~ SOLN
40.0000 mg | SUBCUTANEOUS | Status: DC
  Administered 2014-07-12: 40 mg via SUBCUTANEOUS
  Filled 2014-07-12 (×2): qty 0.4

## 2014-07-12 MED ORDER — FUROSEMIDE 40 MG PO TABS: 40.0000 mg | ORAL_TABLET | Freq: Once | ORAL | Status: DC

## 2014-07-12 MED ORDER — LOSARTAN POTASSIUM 50 MG PO TABS: 100.0000 mg | ORAL_TABLET | Freq: Every day | ORAL | Status: DC

## 2014-07-12 MED ORDER — SUCCINYLCHOLINE CHLORIDE 20 MG/ML IJ SOLN
INTRAMUSCULAR | Status: AC
  Filled 2014-07-12: qty 1

## 2014-07-12 MED ORDER — AMIODARONE HCL 150 MG/3ML IV SOLN
150.0000 mg | Freq: Once | INTRAVENOUS | Status: AC
  Administered 2014-07-12: 150 mg via INTRAVENOUS

## 2014-07-12 MED ORDER — LOSARTAN POTASSIUM 50 MG PO TABS
100.0000 mg | ORAL_TABLET | Freq: Every day | ORAL | Status: DC
  Administered 2014-07-13 – 2014-07-19 (×7): 100 mg via ORAL
  Filled 2014-07-12 (×8): qty 2

## 2014-07-12 MED ORDER — ONDANSETRON HCL 4 MG/2ML IJ SOLN
INTRAMUSCULAR | Status: AC
Start: 2014-07-12 — End: 2014-07-13
  Filled 2014-07-12: qty 2

## 2014-07-12 MED ORDER — CARVEDILOL 12.5 MG PO TABS
12.5000 mg | ORAL_TABLET | Freq: Two times a day (BID) | ORAL | Status: DC
  Administered 2014-07-13 – 2014-07-19 (×12): 12.5 mg via ORAL
  Filled 2014-07-12 (×15): qty 1

## 2014-07-12 MED ORDER — ACETAMINOPHEN 325 MG PO TABS
650.0000 mg | ORAL_TABLET | ORAL | Status: DC | PRN
  Administered 2014-07-14 (×2): 650 mg via ORAL
  Filled 2014-07-12 (×2): qty 2

## 2014-07-12 MED ORDER — AMIODARONE HCL IN DEXTROSE 360-4.14 MG/200ML-% IV SOLN
60.0000 mg/h | INTRAVENOUS | Status: DC
  Administered 2014-07-12: 60 mg/h via INTRAVENOUS
  Filled 2014-07-12: qty 200

## 2014-07-12 MED ORDER — ALPRAZOLAM 0.25 MG PO TABS: 0.2500 mg | ORAL_TABLET | Freq: Two times a day (BID) | ORAL | Status: DC | PRN

## 2014-07-12 MED ORDER — POTASSIUM CHLORIDE CRYS ER 20 MEQ PO TBCR
20.0000 meq | EXTENDED_RELEASE_TABLET | Freq: Every day | ORAL | Status: DC
  Administered 2014-07-13 – 2014-07-18 (×6): 20 meq via ORAL
  Filled 2014-07-12 (×8): qty 1

## 2014-07-12 MED ORDER — AMIODARONE HCL IN DEXTROSE 360-4.14 MG/200ML-% IV SOLN
30.0000 mg/h | INTRAVENOUS | Status: DC
  Administered 2014-07-12: 30 mg/h via INTRAVENOUS
  Filled 2014-07-12 (×4): qty 200

## 2014-07-12 MED ORDER — NITROGLYCERIN 0.4 MG SL SUBL: 0.4000 mg | SUBLINGUAL_TABLET | SUBLINGUAL | Status: DC | PRN

## 2014-07-12 MED ORDER — AMIODARONE HCL IN DEXTROSE 360-4.14 MG/200ML-% IV SOLN: 30.0000 mg/h | INTRAVENOUS | Status: DC

## 2014-07-12 MED ORDER — ALBUTEROL (5 MG/ML) CONTINUOUS INHALATION SOLN
INHALATION_SOLUTION | RESPIRATORY_TRACT | Status: AC
  Administered 2014-07-12: 15 mg/h via RESPIRATORY_TRACT
  Filled 2014-07-12: qty 20

## 2014-07-12 MED ORDER — ASPIRIN 300 MG RE SUPP
300.0000 mg | RECTAL | Status: AC
  Filled 2014-07-12: qty 1

## 2014-07-12 NOTE — Progress Notes (Signed)
Clinical Summary Ms. Warmuth is a 73 y.o.female former patient of Dr Daleen Squibb, this is our first visit together. She is seen for the following medical problems.  1. CAD - remote hx of inferior MI - cath 02/2013 with severe RCA disease, LVEF 45% by LVgram. Treated medically since that time due to mild LV dysfunction with absence of chest pain - echo 01/2013 LVEF 25%. The reason for the discrepancy between the LV gram and echo is unclear   2. PAD - severe left common iliac disease noted on cath   Past Medical History  Diagnosis Date  . Myocardial infarct 07/22/1996    st.elevated inferior wall infarct  . CAD (coronary artery disease)   . Hypertension   . Hypercholesterolemia   . S/P colonoscopy August 2007    hyperplastic polyps, rare sigmoid and descending colon diverticulosis, small internal hemorrhoids  . CHF (congestive heart failure) 12/31/2012     Allergies  Allergen Reactions  . Sulfa Antibiotics Rash     Current Outpatient Prescriptions  Medication Sig Dispense Refill  . aspirin 325 MG tablet Take 325 mg by mouth daily.        . Calcium Citrate (CITRACAL PO) Take 1 tablet by mouth daily.        . carvedilol (COREG) 12.5 MG tablet Take 1 tablet (12.5 mg total) by mouth 2 (two) times daily.  60 tablet  0  . Cholecalciferol (VITAMIN D3) 2000 UNITS capsule Take 2,000 Units by mouth daily.        . furosemide (LASIX) 40 MG tablet Take 1 tablet (40 mg total) by mouth daily.  30 tablet  0  . losartan (COZAAR) 100 MG tablet Take 1 tablet (100 mg total) by mouth daily.  30 tablet  8  . Omega-3 Fatty Acids (FISH OIL) 1000 MG CAPS Take 1 capsule by mouth daily.        . potassium chloride SA (K-DUR,KLOR-CON) 20 MEQ tablet Take 1 tablet (20 mEq total) by mouth daily.  30 tablet  1  . simvastatin (ZOCOR) 40 MG tablet Take 1 tablet (40 mg total) by mouth at bedtime.  90 tablet  3   No current facility-administered medications for this visit.     Past Surgical History    Procedure Laterality Date  . Cardiac catheterization    . S/p hysterectomy       Allergies  Allergen Reactions  . Sulfa Antibiotics Rash      Family History  Problem Relation Age of Onset  . Colon cancer Neg Hx   . Hypertension Sister   . Diabetes Mellitus II Sister   . Hypertension Brother   . Diabetes Mellitus II Brother   . Hypertension Brother   . Diabetes Mellitus II Brother   . Hypertension Brother   . Diabetes Mellitus II Brother      Social History Ms. Villasenor reports that she has been smoking.  She has never used smokeless tobacco. Ms. Bear reports that she does not drink alcohol.   Review of Systems CONSTITUTIONAL: No weight loss, fever, chills, weakness or fatigue.  HEENT: Eyes: No visual loss, blurred vision, double vision or yellow sclerae.No hearing loss, sneezing, congestion, runny nose or sore throat.  SKIN: No rash or itching.  CARDIOVASCULAR:  RESPIRATORY: No shortness of breath, cough or sputum.  GASTROINTESTINAL: No anorexia, nausea, vomiting or diarrhea. No abdominal pain or blood.  GENITOURINARY: No burning on urination, no polyuria NEUROLOGICAL: No headache, dizziness, syncope, paralysis, ataxia, numbness or  tingling in the extremities. No change in bowel or bladder control.  MUSCULOSKELETAL: No muscle, back pain, joint pain or stiffness.  LYMPHATICS: No enlarged nodes. No history of splenectomy.  PSYCHIATRIC: No history of depression or anxiety.  ENDOCRINOLOGIC: No reports of sweating, cold or heat intolerance. No polyuria or polydipsia.  [name]   Physical Examination There were no vitals filed for this visit. There were no vitals filed for this visit.  Gen: resting comfortably, no acute distress HEENT: no scleral icterus, pupils equal round and reactive, no palptable cervical adenopathy,  CV Resp: Clear to auscultation bilaterally GI: abdomen is soft, non-tender, non-distended, normal bowel sounds, no hepatosplenomegaly MSK: extremities  are warm, no edema.  Skin: warm, no rash Neuro:  no focal deficits Psych: appropriate affect   Diagnostic Studies 02/2013 [facility] Procedural Findings:  Hemodynamics:  AO 116/45 with a mean of 70  LV 120/18  Coronary angiography:  Coronary dominance: right  Left mainstem: The left main is patent with 30% distal left main stenosis as the vessel trifurcates into the LAD, intermediate Sabella Traore, and left circumflex.  Left anterior descending (LAD): The LAD is patent with diffuse disease noted. There is minimal calcification present. The first diagonal is patent with 30-50% diffuse disease. The LAD after the first septal perforator has 40-50% stenosis. The vessel reaches the apex and wraps around the left ventricular apex without significant stenosis.  Left circumflex (LCx): The intermediate Breawna Montenegro is small in caliber and diffusely diseased with 50-60% proximal vessel stenosis the AV groove circumflex is diffusely diseased with 50% ostial stenosis and 30% mid stenosis it supplies 2 small posterolateral Averil Digman  Right coronary artery (RCA): The RCA is diffusely diseased. There is significant tortuosity, especially around the junction of the mid and distal vessel. The vessel is diffusely calcified. The mid vessel has tandem 50% stenoses. The distal vessel has 80-90 % stenosis just before the bifurcation of the PDA and posterior AV segment. The PDA and posterior AV segment with 3 posterolateral branches are patent.  Left ventriculography: Left ventricular systolic function is moderately depressed. There is severe hypokinesis of the basal and midinferior walls. The anterolateral and apical walls contract normally. The left ventricular ejection fraction is estimated at 45%.  Abdominal aortogram: The abdominal aorta is patent. The renal arteries are single bilaterally and they are widely patent. There appears to be nonobstructive stenosis of the right common iliac artery at the aortoiliac junction. The left common  iliac artery appears to have significant ostial stenosis estimated at at least 75% angiographically. The visualized portions of the external iliac and common femoral arteries are patent  Final Conclusions:  1. Severe single-vessel coronary artery disease involving the distal right coronary artery  2. Moderate LV dysfunction with segmental severe hypokinesis of the inferior wall and normal contraction of the antero-apex with estimated left ventricular ejection fraction of 45%.  3. Severe left common iliac artery stenosis  Recommendations: The patient has severe distal RCA disease. She has a known history of inferior wall infarction and remote PCA. This fits with that clinical history. She has nonobstructive disease of the left coronary artery. Her left ventricular ejection fraction by ventriculography is clearly greater than 35%. With her history of previous MI, I do not think PCI is indicated with an absence of angina. Will carefully discussed symptoms with the patient, but I am inclined to treat her medically. Will also review indication for consideration of peripheral intervention for her iliac disease.  01/2013 Echo Study Conclusions  - Left ventricle:  The cavity size was mildly to moderately dilated. Wall thickness was normal. Systolic function was severely reduced. The estimated ejection fraction was 25%. Severe diffuse hypokinesis. - Aortic valve: Mildly calcified annulus. Trileaflet. - Mitral valve: Calcified annulus. - Left atrium: The atrium was moderately dilated. - Right ventricle: The cavity size was normal. Wall thickness was mildly to moderately increased. - Right atrium: The atrium was mildly dilated. - Atrial septum: No defect or patent foramen ovale was identified. Impressions:  - Compared to the prior study of 03/13/11, there has been substantial deterioration in LV systolic function.    Assessment and Plan        Antoine Poche, M.D., F.A.C.C.

## 2014-07-12 NOTE — ED Notes (Signed)
Report given to [facility] RN with [facility]. ETA 25 minutes.

## 2014-07-12 NOTE — ED Notes (Signed)
[facility] departure time.

## 2014-07-12 NOTE — ED Notes (Signed)
Pt in V-tach .  Moved to room 2 and placed on defib pads.  Pt states she can't breathe.  Placing pt on bi-pap.

## 2014-07-12 NOTE — ED Notes (Signed)
C/o not being able to breathe.  Pt having runs of v-tach.

## 2014-07-12 NOTE — H&P (Signed)
Patient ID: Nicole Clements MRN: [id number], DOB/AGE: 71-03-1941   Admit date: 07/12/2014   Primary Physician: Lupita Raider, MD Primary Cardiologist: Dr Wyline Mood  HPI: 73 y/o, obese, AA female, followed previously by Dr Daleen Squibb with a history of CAD and cardiomyopathy. She apparently had a remote STEMI in the '90s. She has had depressed EF by echo- 25% but not by cath April 2014- 45%. At cath she had diffuse RCA disease an non critical LAD disease to be treated medically. She has not seen a cardiologist since June 2014. She has been compliant with her medications. She has been complaining of increasing dyspnea on exertion for the past few weeks. She actually had an appointment to see Dr Wyline Mood today. Before she left for the office her dyspnea became so bad EMS was called. She was taken to [facility] and treated with IV Lasix and Bi pap. She reportedly had NSVT and was given an Amiodarone bolus and started on a drip. I have seen PVCs, couplets, and one 3 bt run  on telemetry but there has been no VT and there are no VT strips available. The pt denies chest pain today or in the past few weeks. She denies increasing LE edema or orthopnea. She has not previously had a diagnosis of COPD and does not use inhalers at home. She is much more comfortable now in CCU. She has a foley but there is only 200 cc in it and if it was emptied before transfer there is no record of the amount.    Problem List: Past Medical History  Diagnosis Date  . Myocardial infarct 07/22/1996    st.elevated inferior wall infarct  . CAD (coronary artery disease) April 2014    RCA disease- Med Rx   . Hypertension   . Hypercholesterolemia   . S/P colonoscopy August 2007    hyperplastic polyps, rare sigmoid and descending colon diverticulosis, small internal hemorrhoids  . CHF (congestive heart failure) 12/31/2012  . Ischemic cardiomyopathy     EF 25%-45%  . PVC's (premature ventricular contractions)   . IVCD (intraventricular conduction  defect)     Past Surgical History  Procedure Laterality Date  . Cardiac catheterization  April 2014    Med Rx  . S/p hysterectomy       Allergies:  Allergies  Allergen Reactions  . Sulfa Antibiotics Rash     Home Medications ASA 325 Coreg 12.5 mg BID Lasix 40 mg daily Cozaar 100 mg daily K+ 20 meq daily Zocor 40 mg Fish oil.      Family History  Problem Relation Age of Onset  . Colon cancer Neg Hx   . Hypertension Sister   . Diabetes Mellitus II Sister   . Hypertension Brother   . Diabetes Mellitus II Brother   . Hypertension Brother   . Diabetes Mellitus II Brother   . Hypertension Brother   . Diabetes Mellitus II Brother      History   Social History  . Marital Status: Single    Spouse Name: N/A    Number of Children: 1  . Years of Education: N/A   Occupational History  . retired     Clinical biochemist rep   Social History Main Topics  . Smoking status: Current Every Day Smoker -- 0.50 packs/day for 50 years  . Smokeless tobacco: Never Used     Comment: down to a half a pack daily for the past month   . Alcohol Use: No  . Drug  Use: No  . Sexual Activity: Not on file   Other Topics Concern  . Not on file   Social History Narrative  . No narrative on file     Review of Systems: General: negative for chills, fever, night sweats or weight changes.  Cardiovascular: negative for chest pain, edema, orthopnea, palpitations, paroxysmal nocturnal dyspnea or shortness of breath Dermatological: negative for rash Respiratory: negative for cough or wheezing Urologic: negative for hematuria Abdominal: negative for nausea, vomiting, diarrhea, bright red blood per rectum, melena, or hematemesis Neurologic: negative for visual changes, syncope, or dizziness All other systems reviewed and are otherwise negative except as noted above.  Physical Exam: Blood pressure 121/60, pulse 55, temperature 99.8 F (37.7 C), temperature source Rectal, resp. rate 24,  SpO2 97.00%.  General appearance: alert, cooperative, no distress and moderately obese Neck: no carotid bruit and no JVD Lungs: basilar rales bilat Heart: regular rate and rhythm Abdomen: obese , non tender Extremities: no edema Pulses: 2+ and symmetric Skin: cool and dry Neurologic: Grossly normal    Labs:   Results for orders placed during the hospital encounter of 07/12/14 (from the past 24 hour(s))  I-STAT TROPOININ, ED     Status: None   Collection Time    07/12/14  3:23 PM      Result Value Ref Range   Troponin i, poc 0.03  0.00 - 0.08 ng/mL   Comment 3           CBC     Status: Abnormal   Collection Time    07/12/14  3:25 PM      Result Value Ref Range   WBC 11.7 (*) 4.0 - 10.5 K/uL   RBC 5.20 (*) 3.87 - 5.11 MIL/uL   Hemoglobin 15.4 (*) 12.0 - 15.0 g/dL   HCT 16.1 (*) 09.6 - 04.5 %   MCV 92.5  78.0 - 100.0 fL   MCH 29.6  26.0 - 34.0 pg   MCHC 32.0  30.0 - 36.0 g/dL   RDW 40.9  81.1 - 91.4 %   Platelets 220  150 - 400 K/uL  BLOOD GAS, ARTERIAL     Status: Abnormal   Collection Time    07/12/14  3:45 PM      Result Value Ref Range   FIO2 1.00     Delivery systems BILEVEL POSITIVE AIRWAY PRESSURE     Inspiratory PAP 10.0     Expiratory PAP 5.0     pH, Arterial 7.171 (*) 7.350 - 7.450   pCO2 arterial 57.4 (*) 35.0 - 45.0 mmHg   pO2, Arterial 70.8 (*) 80.0 - 100.0 mmHg   Bicarbonate 20.1  20.0 - 24.0 mEq/L   TCO2 18.8  0 - 100 mmol/L   Acid-base deficit 7.1 (*) 0.0 - 2.0 mmol/L   O2 Saturation 87.2     Patient temperature 37.0     Collection site LEFT RADIAL     Drawn by [id number]     Sample type ARTERIAL DRAW     Allens test (pass/fail) PASS  PASS  I-STAT CHEM 8, ED     Status: Abnormal   Collection Time    07/12/14  3:48 PM      Result Value Ref Range   Sodium 140  137 - 147 mEq/L   Potassium 3.7  3.7 - 5.3 mEq/L   Chloride 105  96 - 112 mEq/L   BUN 9  6 - 23 mg/dL   Creatinine, Ser 6.21  0.50 - 1.10  mg/dL   Glucose, Bld 438 (*) 70 - 99 mg/dL    Calcium, Ion 8.87  5.79 - 1.30 mmol/L   TCO2 22  0 - 100 mmol/L   Hemoglobin 16.0 (*) 12.0 - 15.0 g/dL   HCT 72.8 (*) 20.6 - 01.5 %  PRO B NATRIURETIC PEPTIDE     Status: Abnormal   Collection Time    07/12/14  3:55 PM      Result Value Ref Range   Pro B Natriuretic peptide (BNP) 8927.0 (*) 0 - 125 pg/mL  MAGNESIUM     Status: None   Collection Time    07/12/14  3:55 PM      Result Value Ref Range   Magnesium 2.0  1.5 - 2.5 mg/dL  PROTIME-INR     Status: Abnormal   Collection Time    07/12/14  3:55 PM      Result Value Ref Range   Prothrombin Time 15.7 (*) 11.6 - 15.2 seconds   INR 1.25  0.00 - 1.49  BASIC METABOLIC PANEL     Status: Abnormal   Collection Time    07/12/14  3:55 PM      Result Value Ref Range   Sodium 139  137 - 147 mEq/L   Potassium 4.4  3.7 - 5.3 mEq/L   Chloride 103  96 - 112 mEq/L   CO2 20  19 - 32 mEq/L   Glucose, Bld 265 (*) 70 - 99 mg/dL   BUN 10  6 - 23 mg/dL   Creatinine, Ser 6.15  0.50 - 1.10 mg/dL   Calcium 8.8  8.4 - 37.9 mg/dL   GFR calc non Af Amer 58 (*) >90 mL/min   GFR calc Af Amer 67 (*) >90 mL/min   Anion gap 16 (*) 5 - 15  TROPONIN I     Status: None   Collection Time    07/12/14  3:55 PM      Result Value Ref Range   Troponin I <0.30  <0.30 ng/mL  LACTIC ACID, PLASMA     Status: None   Collection Time    07/12/14  4:20 PM      Result Value Ref Range   Lactic Acid, Venous 1.8  0.5 - 2.2 mmol/L  URINALYSIS, ROUTINE W REFLEX MICROSCOPIC     Status: Abnormal   Collection Time    07/12/14  4:30 PM      Result Value Ref Range   Color, Urine YELLOW  YELLOW   APPearance CLEAR  CLEAR   Specific Gravity, Urine 1.010  1.005 - 1.030   pH 6.0  5.0 - 8.0   Glucose, UA NEGATIVE  NEGATIVE mg/dL   Hgb urine dipstick TRACE (*) NEGATIVE   Bilirubin Urine NEGATIVE  NEGATIVE   Ketones, ur NEGATIVE  NEGATIVE mg/dL   Protein, ur TRACE (*) NEGATIVE mg/dL   Urobilinogen, UA 0.2  0.0 - 1.0 mg/dL   Nitrite NEGATIVE  NEGATIVE   Leukocytes, UA  NEGATIVE  NEGATIVE  URINE MICROSCOPIC-ADD ON     Status: Abnormal   Collection Time    07/12/14  4:30 PM      Result Value Ref Range   Squamous Epithelial / LPF FEW (*) RARE   WBC, UA 3-6  <3 WBC/hpf   RBC / HPF 3-6  <3 RBC/hpf   Bacteria, UA FEW (*) RARE   Casts HYALINE CASTS (*) NEGATIVE   Urine-Other YEAST       Radiology/Studies: Dg Chest Portable 1 View  07/12/2014   CLINICAL DATA:  Shortness of breath  EXAM: PORTABLE CHEST - 1 VIEW  COMPARISON:  01/02/2013  FINDINGS: Cardiac shadow is again enlarged. Diffuse vascular congestion and pulmonary edema is noted. Right basilar infiltrate is noted as well. No acute bony abnormality is seen.  IMPRESSION: Changes consistent with CHF and apparent right basilar infiltrate.   Electronically Signed   By: Alcide Clever M.D.   On: 07/12/2014 15:44    EKG:NSR, IVCD, couplets  ASSESSMENT AND PLAN:  Principal Problem:   Acute respiratory failure with hypoxia- Bi Pap per EMS Active Problems:   Acute pulmonary edema   CAD- remote MI '90s, last cath April 2014- RCA disease-med Rx   Chronic combined systolic and diastolic congestive heart failure   ICM-last EF 45% by cath, 25% by echo April 2014   DYSLIPIDEMIA   TOBACCO USER   Asymptomatic PVCs   PLAN:  She failed to wean off Bi Pap- O2 dropped to 84%, will try facemask and another dose of IV Lasix- 80 mg and a Xopenex treatment as she most likely has underlying COPD with > 50 pk yr history.    Deland Pretty, PA-C 07/12/2014, 7:33 PM

## 2014-07-12 NOTE — ED Notes (Signed)
Pt states nods when asked if she is breathing better. Pt visibly more comfortable with ease of breathing on Bi-pap.

## 2014-07-12 NOTE — ED Notes (Signed)
Report called to [facility] unable to give ETA.

## 2014-07-12 NOTE — ED Notes (Signed)
Pt tolerating bi pap.  Breathing easier.  NSR on monitor.  Pt remains on defib pads.

## 2014-07-12 NOTE — ED Notes (Signed)
[facility] here to transport pt. Pt in stable condition on transfer.

## 2014-07-12 NOTE — ED Provider Notes (Signed)
CSN: [id number]     Arrival date & time 07/12/14  1508 History   First MD Initiated Contact with Patient 07/12/14 1511     Chief Complaint  Patient presents with  . Shortness of Breath     (Consider location/radiation/quality/duration/timing/severity/associated sxs/prior Treatment) HPI Comments: Pt with hx of CHF - seen by [facility] in the past - EF of 25% as of 3/14, presents with intermittent but acutely worsening SOB starting 2 weeks ago - today had severe SOB with inability to talk and tachycardia - pt unable to give hx of arrival due to severe SOB and dyspnea.  EMS reports short runs of V tach.  She had O2 by [facility] en route, no other meds pta.  Patient is a 73 y.o. female presenting with shortness of breath. The history is provided by the patient, the EMS personnel and medical records.  Shortness of Breath   Past Medical History  Diagnosis Date  . Myocardial infarct 07/22/1996    st.elevated inferior wall infarct  . CAD (coronary artery disease)   . Hypertension   . Hypercholesterolemia   . S/P colonoscopy August 2007    hyperplastic polyps, rare sigmoid and descending colon diverticulosis, small internal hemorrhoids  . CHF (congestive heart failure) 12/31/2012   Past Surgical History  Procedure Laterality Date  . Cardiac catheterization    . S/p hysterectomy     Family History  Problem Relation Age of Onset  . Colon cancer Neg Hx   . Hypertension Sister   . Diabetes Mellitus II Sister   . Hypertension Brother   . Diabetes Mellitus II Brother   . Hypertension Brother   . Diabetes Mellitus II Brother   . Hypertension Brother   . Diabetes Mellitus II Brother    History  Substance Use Topics  . Smoking status: Current Every Day Smoker -- 0.50 packs/day for 50 years  . Smokeless tobacco: Never Used     Comment: down to a half a pack daily for the past month   . Alcohol Use: No   OB History   Grav Para Term Preterm Abortions TAB SAB Ect Mult Living       Review of Systems  Unable to perform ROS: Severe respiratory distress  Respiratory: Positive for shortness of breath.       Allergies  Sulfa antibiotics  Home Medications   Prior to Admission medications   Medication Sig Start Date End Date Taking? Authorizing Provider  aspirin 325 MG tablet Take 325 mg by mouth daily.      Historical Provider, MD  Calcium Citrate (CITRACAL PO) Take 1 tablet by mouth daily.      Historical Provider, MD  carvedilol (COREG) 12.5 MG tablet Take 1 tablet (12.5 mg total) by mouth 2 (two) times daily. 06/26/14   Jodelle Gross, NP  Cholecalciferol (VITAMIN D3) 2000 UNITS capsule Take 2,000 Units by mouth daily.      Historical Provider, MD  furosemide (LASIX) 40 MG tablet Take 1 tablet (40 mg total) by mouth daily. 05/18/14   Laqueta Linden, MD  losartan (COZAAR) 100 MG tablet Take 1 tablet (100 mg total) by mouth daily. 07/20/13   Antoine Poche, MD  Omega-3 Fatty Acids (FISH OIL) 1000 MG CAPS Take 1 capsule by mouth daily.      Historical Provider, MD  potassium chloride SA (K-DUR,KLOR-CON) 20 MEQ tablet Take 1 tablet (20 mEq total) by mouth daily. 05/08/14   Antoine Poche, MD  simvastatin (ZOCOR) 40  MG tablet Take 1 tablet (40 mg total) by mouth at bedtime. 11/01/13   Antoine Poche, MD   BP 141/78  Pulse 68  Resp 27  SpO2 98% Physical Exam  Nursing note and vitals reviewed. Constitutional: She appears distressed.  HENT:  Head: Normocephalic and atraumatic.  Mouth/Throat: Oropharynx is clear and moist. No oropharyngeal exudate.  Cyanotic lips  Eyes: Conjunctivae and EOM are normal. Pupils are equal, round, and reactive to light. Right eye exhibits no discharge. Left eye exhibits no discharge. No scleral icterus.  Neck: Normal range of motion. Neck supple. No JVD present. No thyromegaly present.  Cardiovascular: Regular rhythm, normal heart sounds and intact distal pulses.  Exam reveals no gallop and no friction rub.   No murmur  heard. Normal rate intrinsic but runs of V tach on monitor lasting up to 10 seconds  Pulmonary/Chest: She is in respiratory distress. She has wheezes. She has rales.  Can't speak, Tachypnea, severe distress  Abdominal: Soft. Bowel sounds are normal. She exhibits no distension and no mass. There is no tenderness.  Musculoskeletal: Normal range of motion. She exhibits edema ( scant bilateral LE edema). She exhibits no tenderness.  Lymphadenopathy:    She has no cervical adenopathy.  Neurological: She is alert. Coordination normal.  Skin: Skin is warm. No rash noted. She is diaphoretic. No erythema.  Psychiatric: She has a normal mood and affect. Her behavior is normal.    ED Course  Procedures (including critical care time) Labs Review Labs Reviewed  CBC - Abnormal; Notable for the following:    WBC 11.7 (*)    RBC 5.20 (*)    Hemoglobin 15.4 (*)    HCT 48.1 (*)    All other components within normal limits  PROTIME-INR - Abnormal; Notable for the following:    Prothrombin Time 15.7 (*)    All other components within normal limits  BLOOD GAS, ARTERIAL - Abnormal; Notable for the following:    pH, Arterial 7.171 (*)    pCO2 arterial 57.4 (*)    pO2, Arterial 70.8 (*)    Acid-base deficit 7.1 (*)    All other components within normal limits  I-STAT CHEM 8, ED - Abnormal; Notable for the following:    Glucose, Bld 228 (*)    Hemoglobin 16.0 (*)    HCT 47.0 (*)    All other components within normal limits  CULTURE, BLOOD (ROUTINE X 2)  CULTURE, BLOOD (ROUTINE X 2)  PRO B NATRIURETIC PEPTIDE  MAGNESIUM  BASIC METABOLIC PANEL  TROPONIN I  LACTIC ACID, PLASMA  I-STAT TROPOININ, ED    Imaging Review Dg Chest Portable 1 View  07/12/2014   CLINICAL DATA:  Shortness of breath  EXAM: PORTABLE CHEST - 1 VIEW  COMPARISON:  01/02/2013  FINDINGS: Cardiac shadow is again enlarged. Diffuse vascular congestion and pulmonary edema is noted. Right basilar infiltrate is noted as well. No acute  bony abnormality is seen.  IMPRESSION: Changes consistent with CHF and apparent right basilar infiltrate.   Electronically Signed   By: Alcide Clever M.D.   On: 07/12/2014 15:44     EKG Interpretation   Date/Time:  Wednesday July 12 2014 15:37:31 EDT Ventricular Rate:  86 PR Interval:  194 QRS Duration: 156 QT Interval:  404 QTC Calculation: 483 R Axis:   167 Text Interpretation:  Sinus rhythm Probable left atrial enlargement  Nonspecific intraventricular conduction delay Probable anterolateral  infarct, acute Baseline wander in lead(s) V5 Abnormal ekg Since last  tracing rate slower Confirmed by Kendle Erker  MD, Ranada Vigorito ([id number]) on 07/12/2014  4:05:19 PM      MDM   Final diagnoses:  Ventricular tachycardia  Acute systolic congestive heart failure    Immediately placed in bipap on arrival due to ongoing severe hypoxia dn resp distress - has xray that shows pulm edema, BP slightly elevated - amio given for V tach with some improvement - less frequent runs.  ECG without acute ischemia - labs pending.    I discussed care with Cardiology Dr. Diona Browner who agrees that the pt needs to be admitted to the hospital and recommends tx to [facility] - will d/w cardiology.    Xray shows pulm edema,  CBC with mild leukocytosis BMP without acute findings other than hyperglycemia I stat trop neg ABG shows respiratory acidosis  Repeat ecg without acute findings - known BBB, on multiple repeat evals the pt has decreased cardiac excitability and less V tach, now rare (4;10 PM)  Acute interventions  Bipap Amiodarone Cardiac monitoring ABG with interpretation  D/w Dr. Elnora Morrison ccept to ICU bed  CRITICAL CARE Performed by: Vida Roller Total critical care time: 50 Critical care time was exclusive of separately billable procedures and treating other patients. Critical care was necessary to treat or prevent imminent or life-threatening deterioration. Critical care was time spent  personally by me on the following activities: development of treatment plan with patient and/or surrogate as well as nursing, discussions with consultants, evaluation of patient's response to treatment, examination of patient, obtaining history from patient or surrogate, ordering and performing treatments and interventions, ordering and review of laboratory studies, ordering and review of radiographic studies, pulse oximetry and re-evaluation of patient's condition.   Vida Roller, MD 07/12/14 [phone]

## 2014-07-12 NOTE — H&P (Signed)
Pt. Seen and examined. Agree with the NP/PA-C note as written.  Pleasant 73 yo female with progressive dyspnea over the past 2 days, orthopnea, palpitations, but no chest pain. Was supposed to see Dr. Wyline Mood as a new patient today, but decompensated and presented to the ER at [facility]. Found to be in decompensated CHF (BNP 8927), required bipap, nitro gtts. Also had NSVT and given amiodarone. Still having some short runs of NSVT here. Diuresing well. Will give additional lasix, continue amiodarone, nebulizer - plan for echo tomorrow. Cycle cardiac enzymes overnight.   Chrystie Nose, MD, [facility] Attending Cardiologist [facility]

## 2014-07-12 NOTE — Progress Notes (Signed)
eLink Physician-Brief Progress Note Patient Name: Nicole Clements DOB: 1941/08/05 MRN: [id number]   Date of Service  07/12/2014  HPI/Events of Note  Transfer from [facility], CHF, respiratory failure Cardiology service Hemodynamically stable  eICU Interventions  BiPAP Amiodarone gtt Nitroglycerine gtt Lasix     Intervention Category Evaluation Type: New Patient Evaluation  Nicole Clements 07/12/2014, 7:47 PM

## 2014-07-13 ENCOUNTER — Encounter (HOSPITAL_COMMUNITY): Payer: Self-pay | Admitting: Family Medicine

## 2014-07-13 DIAGNOSIS — I472 Ventricular tachycardia: Secondary | ICD-10-CM

## 2014-07-13 DIAGNOSIS — I2589 Other forms of chronic ischemic heart disease: Secondary | ICD-10-CM

## 2014-07-13 DIAGNOSIS — I4729 Other ventricular tachycardia: Secondary | ICD-10-CM

## 2014-07-13 DIAGNOSIS — I252 Old myocardial infarction: Secondary | ICD-10-CM

## 2014-07-13 DIAGNOSIS — I059 Rheumatic mitral valve disease, unspecified: Secondary | ICD-10-CM

## 2014-07-13 DIAGNOSIS — J9600 Acute respiratory failure, unspecified whether with hypoxia or hypercapnia: Secondary | ICD-10-CM

## 2014-07-13 DIAGNOSIS — I5021 Acute systolic (congestive) heart failure: Secondary | ICD-10-CM

## 2014-07-13 DIAGNOSIS — R0609 Other forms of dyspnea: Secondary | ICD-10-CM

## 2014-07-13 DIAGNOSIS — R0989 Other specified symptoms and signs involving the circulatory and respiratory systems: Secondary | ICD-10-CM

## 2014-07-13 DIAGNOSIS — I509 Heart failure, unspecified: Secondary | ICD-10-CM

## 2014-07-13 LAB — LIPID PANEL
Cholesterol: 105 mg/dL (ref 0–200)
HDL: 29 mg/dL — ABNORMAL LOW (ref >39)
LDL Cholesterol: 60 mg/dL (ref 0–99)
Total CHOL/HDL Ratio: 3.6 RATIO
Triglycerides: 78 mg/dL (ref <150)
VLDL: 16 mg/dL (ref 0–40)

## 2014-07-13 LAB — BASIC METABOLIC PANEL
Anion gap: 14 (ref 5–15)
BUN: 13 mg/dL (ref 6–23)
CHLORIDE: 103 meq/L (ref 96–112)
CO2: 25 meq/L (ref 19–32)
CREATININE: 0.91 mg/dL (ref 0.50–1.10)
Calcium: 8.4 mg/dL (ref 8.4–10.5)
GFR calc Af Amer: 72 mL/min — ABNORMAL LOW (ref >90)
GFR calc non Af Amer: 62 mL/min — ABNORMAL LOW (ref >90)
GLUCOSE: 107 mg/dL — AB (ref 70–99)
Potassium: 3.7 mEq/L (ref 3.7–5.3)
Sodium: 142 mEq/L (ref 137–147)

## 2014-07-13 LAB — TROPONIN I
Troponin I: <0.3 ng/mL (ref <0.30)
Troponin I: 0.35 ng/mL (ref <0.30)

## 2014-07-13 MED ORDER — CETYLPYRIDINIUM CHLORIDE 0.05 % MT LIQD
7.0000 mL | Freq: Two times a day (BID) | OROMUCOSAL | Status: DC
  Administered 2014-07-13 – 2014-07-16 (×4): 7 mL via OROMUCOSAL

## 2014-07-13 MED ORDER — SODIUM CHLORIDE 0.9 % IV SOLN: INTRAVENOUS | Status: DC

## 2014-07-13 MED ORDER — INFLUENZA VAC SPLIT QUAD 0.5 ML IM SUSY
0.5000 mL | PREFILLED_SYRINGE | INTRAMUSCULAR | Status: AC
  Administered 2014-07-14: 0.5 mL via INTRAMUSCULAR
  Filled 2014-07-13: qty 0.5

## 2014-07-13 MED ORDER — PNEUMOCOCCAL VAC POLYVALENT 25 MCG/0.5ML IJ INJ: 0.5000 mL | INJECTION | INTRAMUSCULAR | Status: DC

## 2014-07-13 MED FILL — Medication: Qty: 1 | Status: AC

## 2014-07-13 NOTE — Care Management Note (Addendum)
    Page 1 of 2   07/18/2014     11:03:31 AM CARE MANAGEMENT NOTE 07/18/2014  Patient:  Nicole Clements, Nicole Clements   Account Number:  [id number]  Date Initiated:  07/13/2014  Documentation initiated by:  Nicole Clements  Subjective/Objective Assessment:   adm w resp distress     Action/Plan:   lives alone, pcp dr Nicole Clements   Anticipated DC Date:  07/19/2014   Anticipated DC Plan:  HOME W HOME HEALTH SERVICES      DC Planning Services  CM consult      [facility] Choice  HOME HEALTH  DURABLE MEDICAL EQUIPMENT   Choice offered to / List presented to:  C-1 Patient   DME arranged  VEST - [facility]        HH arranged  HH-1 RN  HH-10 DISEASE MANAGEMENT      HH agency  [facility]   Status of service:  Completed, signed off Medicare Important Message given?  YES (If response is "NO", the following Medicare IM given date fields will be blank) Date Medicare IM given:  07/17/2014 Medicare IM given by:  [facility] Date Additional Medicare IM given:   Additional Medicare IM given by:    Discharge Disposition:    Per UR Regulation:  Reviewed for med. necessity/level of care/duration of stay  If discussed at Long Length of Stay Meetings, dates discussed:    Comments:  07/18/13 1010 Nicole Cohn, RN, BSN, [employer] [phone] Spoke with pt at bedside regarding discharge planning for [facility]. Offered pt list of home health agencies to choose from.  Pt chose [facility] to render services. Nicole Reading, RN of [facility] notified.  No DME needs identified at this time.  Pt to d/c home with [facility].  Consult to Nicole Clements of [place]; NCM spoke with Nicole Clements to ensure he would be in to speak to pt today and possibly have pt fit this evening.

## 2014-07-13 NOTE — Progress Notes (Signed)
CRITICAL VALUE ALERT  Critical value received:  Troponin 0.35  Date of notification:  07/13/2014  Time of notification:  1145  Critical value read back:Yes.    Nurse who received alert:  Wilfred Curtis   MD notified (1st page):  Dr. Rennis Golden  Time of first page:  1145  MD notified (2nd page):  Time of second page:  Responding MD:  Dr. Rennis Golden  Time MD responded:  [phone]

## 2014-07-13 NOTE — Plan of Care (Signed)
Problem: Consults Goal: Tobacco Cessation referral if indicated Outcome: Progressing Attached information to discharge AVS

## 2014-07-13 NOTE — Progress Notes (Signed)
Echocardiogram 2D Echocardiogram has been performed.  Nicole Clements 07/13/2014, 12:01 PM

## 2014-07-13 NOTE — Progress Notes (Signed)
DAILY PROGRESS NOTE  Subjective:  Diuresing well. -1.5L overnight. Weight is down 2 lbs. Breathing better, but still requiring supplemental O2. Echo in progress - bedside review reveals significant cardiomyopathy with EF in the 20-25% range.  Objective:  Temp:  [98 F (36.7 C)-99.8 F (37.7 C)] 98.1 F (36.7 C) (09/10 0700) Pulse Rate:  [30-83] 59 (09/10 0900) Resp:  [13-36] 27 (09/10 0900) BP: (110-157)/(47-118) 111/78 mmHg (09/10 0900) SpO2:  [73 %-99 %] 89 % (09/10 0900) FiO2 (%):  [55 %-60 %] 55 % (09/10 0900) Weight:  [167 lb 15.9 oz (76.2 kg)-169 lb 1.5 oz (76.7 kg)] 167 lb 15.9 oz (76.2 kg) (09/10 0300) Weight change:   Intake/Output from previous day: 09/09 0701 - 09/10 0700 In: 550.7 [P.O.:240; I.V.:310.7] Out: 1975 [BHALP:3790]  Intake/Output from this shift: Total I/O In: 283.4 [P.O.:240; I.V.:43.4] Out: 825 [Urine:825]  Medications: Current Facility-Administered Medications  Medication Dose Route Frequency Provider Last Rate Last Dose  . 0.9 %  sodium chloride infusion   Intravenous Continuous Pixie Casino, MD 5 mL/hr at 07/13/14 0800    . acetaminophen (TYLENOL) tablet 650 mg  650 mg Oral Q4H PRN Erlene Quan, PA-C      . ALPRAZolam Duanne Moron) tablet 0.25 mg  0.25 mg Oral BID PRN Erlene Quan, PA-C      . amiodarone (NEXTERONE PREMIX) 360 MG/200ML (1.8 mg/mL) IV infusion  30 mg/hr Intravenous Continuous Erlene Quan, PA-C 16.7 mL/hr at 07/13/14 0905 30 mg/hr at 07/13/14 0905  . antiseptic oral rinse (CPC / CETYLPYRIDINIUM CHLORIDE 0.05%) solution 7 mL  7 mL Mouth Rinse BID Janora Norlander, MD      . aspirin EC tablet 81 mg  81 mg Oral Daily Erlene Quan, PA-C   81 mg at 07/13/14 0858  . carvedilol (COREG) tablet 12.5 mg  12.5 mg Oral BID Doreene Burke Kilroy, PA-C   12.5 mg at 07/13/14 0859  . enoxaparin (LOVENOX) injection 40 mg  40 mg Subcutaneous Q24H Doreene Burke Chireno, PA-C   40 mg at 07/12/14 2221  . furosemide (LASIX) injection 80 mg  80 mg Intravenous BID  Erlene Quan, PA-C   80 mg at 07/13/14 0734  . [START ON 07/14/2014] Influenza vac split quadrivalent PF (FLUARIX) injection 0.5 mL  0.5 mL Intramuscular Tomorrow-1000 Pixie Casino, MD      . levalbuterol Penne Lash) nebulizer solution 0.63 mg  0.63 mg Nebulization Q6H PRN Erlene Quan, PA-C   0.63 mg at 07/12/14 2007  . losartan (COZAAR) tablet 100 mg  100 mg Oral Daily Erlene Quan, PA-C   100 mg at 07/13/14 2409  . nitroGLYCERIN (NITROSTAT) SL tablet 0.4 mg  0.4 mg Sublingual Q5 Min x 3 PRN Luke K Kilroy, PA-C      . potassium chloride SA (K-DUR,KLOR-CON) CR tablet 20 mEq  20 mEq Oral Daily Erlene Quan, PA-C   20 mEq at 07/13/14 0859  . simvastatin (ZOCOR) tablet 40 mg  40 mg Oral QHS Luke K Kilroy, PA-C      . zolpidem (AMBIEN) tablet 5 mg  5 mg Oral QHS PRN Erlene Quan, PA-C        Physical Exam: General appearance: alert, no distress and on venti mask Neck: JVD - 5 cm above sternal notch and no carotid bruit Lungs: diminished breath sounds bilaterally and wheezes apex - bilateral Heart: regular rate and rhythm and occasional ectopy Abdomen: obese,soft, non-tender, +BS Extremities: extremities normal, atraumatic, no cyanosis  or edema Pulses: 2+ and symmetric Skin: Skin color, texture, turgor normal. No rashes or lesions Neurologic: Grossly normal .  Lab Results: Results for orders placed during the hospital encounter of 07/12/14 (from the past 48 hour(s))  I-STAT TROPOININ, ED     Status: None   Collection Time    07/12/14  3:23 PM      Result Value Ref Range   Troponin i, poc 0.03  0.00 - 0.08 ng/mL   Comment 3            Comment: Due to the release kinetics of cTnI,     a negative result within the first hours     of the onset of symptoms does not rule out     myocardial infarction with certainty.     If myocardial infarction is still suspected,     repeat the test at appropriate intervals.  CBC     Status: Abnormal   Collection Time    07/12/14  3:25 PM       Result Value Ref Range   WBC 11.7 (*) 4.0 - 10.5 K/uL   RBC 5.20 (*) 3.87 - 5.11 MIL/uL   Hemoglobin 15.4 (*) 12.0 - 15.0 g/dL   HCT 48.1 (*) 36.0 - 46.0 %   MCV 92.5  78.0 - 100.0 fL   MCH 29.6  26.0 - 34.0 pg   MCHC 32.0  30.0 - 36.0 g/dL   RDW 13.9  11.5 - 15.5 %   Platelets 220  150 - 400 K/uL  BLOOD GAS, ARTERIAL     Status: Abnormal   Collection Time    07/12/14  3:45 PM      Result Value Ref Range   FIO2 1.00     Delivery systems BILEVEL POSITIVE AIRWAY PRESSURE     Inspiratory PAP 10.0     Expiratory PAP 5.0     pH, Arterial 7.171 (*) 7.350 - 7.450   Comment: CRITICAL RESULT CALLED TO, READ BACK BY AND VERIFIED WITH:     JUDY YOUNG RN AT 1557 BY JESSICA HALEY RRT RCP ON 07/12/2014   pCO2 arterial 57.4 (*) 35.0 - 45.0 mmHg   Comment: CRITICAL RESULT CALLED TO, READ BACK BY AND VERIFIED WITH:     JUDY YOUNG RN AT [place] RRT RCP ON 07/12/2014   pO2, Arterial 70.8 (*) 80.0 - 100.0 mmHg   Bicarbonate 20.1  20.0 - 24.0 mEq/L   TCO2 18.8  0 - 100 mmol/L   Acid-base deficit 7.1 (*) 0.0 - 2.0 mmol/L   O2 Saturation 87.2     Patient temperature 37.0     Collection site LEFT RADIAL     Drawn by [id number]     Sample type ARTERIAL DRAW     Allens test (pass/fail) PASS  PASS  I-STAT CHEM 8, ED     Status: Abnormal   Collection Time    07/12/14  3:48 PM      Result Value Ref Range   Sodium 140  137 - 147 mEq/L   Potassium 3.7  3.7 - 5.3 mEq/L   Chloride 105  96 - 112 mEq/L   BUN 9  6 - 23 mg/dL   Creatinine, Ser 0.90  0.50 - 1.10 mg/dL   Glucose, Bld 228 (*) 70 - 99 mg/dL   Calcium, Ion 1.17  1.13 - 1.30 mmol/L   TCO2 22  0 - 100 mmol/L   Hemoglobin 16.0 (*) 12.0 - 15.0 g/dL  HCT 47.0 (*) 36.0 - 46.0 %  PRO B NATRIURETIC PEPTIDE     Status: Abnormal   Collection Time    07/12/14  3:55 PM      Result Value Ref Range   Pro B Natriuretic peptide (BNP) 8927.0 (*) 0 - 125 pg/mL  MAGNESIUM     Status: None   Collection Time    07/12/14  3:55 PM      Result Value  Ref Range   Magnesium 2.0  1.5 - 2.5 mg/dL  PROTIME-INR     Status: Abnormal   Collection Time    07/12/14  3:55 PM      Result Value Ref Range   Prothrombin Time 15.7 (*) 11.6 - 15.2 seconds   INR 1.25  0.00 - 5.62  BASIC METABOLIC PANEL     Status: Abnormal   Collection Time    07/12/14  3:55 PM      Result Value Ref Range   Sodium 139  137 - 147 mEq/L   Potassium 4.4  3.7 - 5.3 mEq/L   Chloride 103  96 - 112 mEq/L   CO2 20  19 - 32 mEq/L   Glucose, Bld 265 (*) 70 - 99 mg/dL   BUN 10  6 - 23 mg/dL   Creatinine, Ser 0.96  0.50 - 1.10 mg/dL   Calcium 8.8  8.4 - 10.5 mg/dL   GFR calc non Af Amer 58 (*) >90 mL/min   GFR calc Af Amer 67 (*) >90 mL/min   Comment: (NOTE)     The eGFR has been calculated using the CKD EPI equation.     This calculation has not been validated in all clinical situations.     eGFR's persistently <90 mL/min signify possible Chronic Kidney     Disease.   Anion gap 16 (*) 5 - 15  TROPONIN I     Status: None   Collection Time    07/12/14  3:55 PM      Result Value Ref Range   Troponin I <0.30  <0.30 ng/mL   Comment:            Due to the release kinetics of cTnI,     a negative result within the first hours     of the onset of symptoms does not rule out     myocardial infarction with certainty.     If myocardial infarction is still suspected,     repeat the test at appropriate intervals.  LACTIC ACID, PLASMA     Status: None   Collection Time    07/12/14  4:20 PM      Result Value Ref Range   Lactic Acid, Venous 1.8  0.5 - 2.2 mmol/L  CULTURE, BLOOD (ROUTINE X 2)     Status: None   Collection Time    07/12/14  4:20 PM      Result Value Ref Range   Specimen Description BLOOD RIGHT ANTECUBITAL     Special Requests BOTTLES DRAWN AEROBIC AND ANAEROBIC 8CC     Culture NO GROWTH 1 DAY     Report Status PENDING    CULTURE, BLOOD (ROUTINE X 2)     Status: None   Collection Time    07/12/14  4:21 PM      Result Value Ref Range   Specimen  Description BLOOD LEFT HAND     Special Requests       Value: BOTTLES DRAWN AEROBIC AND ANAEROBIC AEB=10CC ANA=8CC  Culture NO GROWTH 1 DAY     Report Status PENDING    URINALYSIS, ROUTINE W REFLEX MICROSCOPIC     Status: Abnormal   Collection Time    07/12/14  4:30 PM      Result Value Ref Range   Color, Urine YELLOW  YELLOW   APPearance CLEAR  CLEAR   Specific Gravity, Urine 1.010  1.005 - 1.030   pH 6.0  5.0 - 8.0   Glucose, UA NEGATIVE  NEGATIVE mg/dL   Hgb urine dipstick TRACE (*) NEGATIVE   Bilirubin Urine NEGATIVE  NEGATIVE   Ketones, ur NEGATIVE  NEGATIVE mg/dL   Protein, ur TRACE (*) NEGATIVE mg/dL   Urobilinogen, UA 0.2  0.0 - 1.0 mg/dL   Nitrite NEGATIVE  NEGATIVE   Leukocytes, UA NEGATIVE  NEGATIVE  URINE MICROSCOPIC-ADD ON     Status: Abnormal   Collection Time    07/12/14  4:30 PM      Result Value Ref Range   Squamous Epithelial / LPF FEW (*) RARE   WBC, UA 3-6  <3 WBC/hpf   RBC / HPF 3-6  <3 RBC/hpf   Bacteria, UA FEW (*) RARE   Casts HYALINE CASTS (*) NEGATIVE   Urine-Other YEAST    MRSA PCR SCREENING     Status: None   Collection Time    07/12/14  7:11 PM      Result Value Ref Range   MRSA by PCR NEGATIVE  NEGATIVE   Comment:            The GeneXpert MRSA Assay (FDA     approved for NASAL specimens     only), is one component of a     comprehensive MRSA colonization     surveillance program. It is not     intended to diagnose MRSA     infection nor to guide or     monitor treatment for     MRSA infections.  CBC     Status: None   Collection Time    07/12/14  9:30 PM      Result Value Ref Range   WBC 9.0  4.0 - 10.5 K/uL   RBC 4.71  3.87 - 5.11 MIL/uL   Hemoglobin 13.6  12.0 - 15.0 g/dL   HCT 42.3  36.0 - 46.0 %   MCV 89.8  78.0 - 100.0 fL   MCH 28.9  26.0 - 34.0 pg   MCHC 32.2  30.0 - 36.0 g/dL   RDW 13.6  11.5 - 15.5 %   Platelets 197  150 - 400 K/uL  CREATININE, SERUM     Status: Abnormal   Collection Time    07/12/14  9:30 PM       Result Value Ref Range   Creatinine, Ser 0.90  0.50 - 1.10 mg/dL   GFR calc non Af Amer 63 (*) >90 mL/min   GFR calc Af Amer 73 (*) >90 mL/min   Comment: (NOTE)     The eGFR has been calculated using the CKD EPI equation.     This calculation has not been validated in all clinical situations.     eGFR's persistently <90 mL/min signify possible Chronic Kidney     Disease.  TSH     Status: None   Collection Time    07/12/14  9:30 PM      Result Value Ref Range   TSH 0.810  0.350 - 4.500 uIU/mL  TROPONIN I     Status:  None   Collection Time    07/12/14  9:30 PM      Result Value Ref Range   Troponin I <0.30  <0.30 ng/mL   Comment:            Due to the release kinetics of cTnI,     a negative result within the first hours     of the onset of symptoms does not rule out     myocardial infarction with certainty.     If myocardial infarction is still suspected,     repeat the test at appropriate intervals.  BASIC METABOLIC PANEL     Status: Abnormal   Collection Time    07/13/14  3:15 AM      Result Value Ref Range   Sodium 142  137 - 147 mEq/L   Potassium 3.7  3.7 - 5.3 mEq/L   Chloride 103  96 - 112 mEq/L   CO2 25  19 - 32 mEq/L   Glucose, Bld 107 (*) 70 - 99 mg/dL   BUN 13  6 - 23 mg/dL   Creatinine, Ser 0.91  0.50 - 1.10 mg/dL   Calcium 8.4  8.4 - 10.5 mg/dL   GFR calc non Af Amer 62 (*) >90 mL/min   GFR calc Af Amer 72 (*) >90 mL/min   Comment: (NOTE)     The eGFR has been calculated using the CKD EPI equation.     This calculation has not been validated in all clinical situations.     eGFR's persistently <90 mL/min signify possible Chronic Kidney     Disease.   Anion gap 14  5 - 15  LIPID PANEL     Status: Abnormal   Collection Time    07/13/14  3:15 AM      Result Value Ref Range   Cholesterol 105  0 - 200 mg/dL   Triglycerides 78  <150 mg/dL   HDL 29 (*) >39 mg/dL   Total CHOL/HDL Ratio 3.6     VLDL 16  0 - 40 mg/dL   LDL Cholesterol 60  0 - 99 mg/dL    Comment:            Total Cholesterol/HDL:CHD Risk     Coronary Heart Disease Risk Table                         Men   Women      1/2 Average Risk   3.4   3.3      Average Risk       5.0   4.4      2 X Average Risk   9.6   7.1      3 X Average Risk  23.4   11.0                Use the calculated Patient Ratio     above and the CHD Risk Table     to determine the patient's CHD Risk.                ATP III CLASSIFICATION (LDL):      <100     mg/dL   Optimal      100-129  mg/dL   Near or Above                        Optimal      130-159  mg/dL   Borderline  160-189  mg/dL   High      >190     mg/dL   Very High  TROPONIN I     Status: None   Collection Time    07/13/14  3:20 AM      Result Value Ref Range   Troponin I <0.30  <0.30 ng/mL   Comment:            Due to the release kinetics of cTnI,     a negative result within the first hours     of the onset of symptoms does not rule out     myocardial infarction with certainty.     If myocardial infarction is still suspected,     repeat the test at appropriate intervals.    Imaging: Dg Chest Portable 1 View  07/12/2014   CLINICAL DATA:  Shortness of breath  EXAM: PORTABLE CHEST - 1 VIEW  COMPARISON:  01/02/2013  FINDINGS: Cardiac shadow is again enlarged. Diffuse vascular congestion and pulmonary edema is noted. Right basilar infiltrate is noted as well. No acute bony abnormality is seen.  IMPRESSION: Changes consistent with CHF and apparent right basilar infiltrate.   Electronically Signed   By: Inez Catalina M.D.   On: 07/12/2014 15:44    Assessment:  1. Principal Problem: 2.   Acute respiratory failure with hypoxia- Bi Pap per EMS 3. Active Problems: 4.   DYSLIPIDEMIA 5.   TOBACCO USER 6.   CAD- remote MI '90s, last cath April 2014- RCA disease-med Rx 7.   Chronic combined systolic and diastolic congestive heart failure 8.   Acute pulmonary edema 9.   ICM-last EF 45% by cath, 25% by echo April 2014 10.   Asymptomatic  PVCs 11.   Acute respiratory failure 12.   Plan:  1. Diuresed well overnight. Continue lasix 80 mg IV BID. Echo shows not-surprisingly significant systolic dysfunction as was noted last year. Her NSVT has improved. Bradycardic today. Will d/c amidodarone. Continue B-blocker. She may need a repeat ischemia evaluation and ultimately looks like she would benefit from an AICD once she is more compensated.    Time Spent Directly with Patient:  15 minutes  Length of Stay:  LOS: 1 day   Pixie Casino, MD, [facility] Attending Cardiologist [facility]  Cortni Tays C 07/13/2014, 10:51 AM

## 2014-07-14 DIAGNOSIS — I5043 Acute on chronic combined systolic (congestive) and diastolic (congestive) heart failure: Secondary | ICD-10-CM | POA: Diagnosis not present

## 2014-07-14 DIAGNOSIS — I428 Other cardiomyopathies: Secondary | ICD-10-CM

## 2014-07-14 LAB — BASIC METABOLIC PANEL
ANION GAP: 15 (ref 5–15)
BUN: 17 mg/dL (ref 6–23)
CHLORIDE: 100 meq/L (ref 96–112)
CO2: 27 meq/L (ref 19–32)
Calcium: 8.5 mg/dL (ref 8.4–10.5)
Creatinine, Ser: 0.93 mg/dL (ref 0.50–1.10)
GFR calc non Af Amer: 60 mL/min — ABNORMAL LOW (ref >90)
GFR, EST AFRICAN AMERICAN: 70 mL/min — AB (ref >90)
Glucose, Bld: 126 mg/dL — ABNORMAL HIGH (ref 70–99)
POTASSIUM: 3.8 meq/L (ref 3.7–5.3)
Sodium: 142 mEq/L (ref 137–147)

## 2014-07-14 LAB — PRO B NATRIURETIC PEPTIDE: PRO B NATRI PEPTIDE: 2022 pg/mL — AB (ref 0–125)

## 2014-07-14 MED ORDER — SORBITOL 70 % SOLN
30.0000 mL | Freq: Every day | Status: DC | PRN
  Administered 2014-07-14: 30 mL via ORAL
  Filled 2014-07-14: qty 30

## 2014-07-14 MED ORDER — FUROSEMIDE 80 MG PO TABS
80.0000 mg | ORAL_TABLET | Freq: Every day | ORAL | Status: DC
  Administered 2014-07-15 – 2014-07-19 (×5): 80 mg via ORAL
  Filled 2014-07-14 (×6): qty 1

## 2014-07-14 MED ORDER — POTASSIUM CHLORIDE CRYS ER 20 MEQ PO TBCR
40.0000 meq | EXTENDED_RELEASE_TABLET | Freq: Once | ORAL | Status: AC
  Administered 2014-07-14: 40 meq via ORAL
  Filled 2014-07-14: qty 2

## 2014-07-14 NOTE — Progress Notes (Signed)
DAILY PROGRESS NOTE  Subjective:  Breathing much better - sitting up in the chair. Net negative another 2L overnight. BNP has decreased from 8927 to 2022. Weight is coming down. Decreased oxygen requirement. Feels full in abdomen - no BM since Sunday last week.  Objective:  Temp:  [97.3 F (36.3 C)-98.4 F (36.9 C)] 98 F (36.7 C) (09/11 0813) Pulse Rate:  [41-61] 54 (09/11 1000) Resp:  [14-26] 14 (09/11 1000) BP: (112-177)/(43-74) 169/59 mmHg (09/11 1000) SpO2:  [89 %-100 %] 94 % (09/11 1000) FiO2 (%):  [40 %-55 %] 40 % (09/10 1930) Weight:  [166 lb 10.7 oz (75.6 kg)] 166 lb 10.7 oz (75.6 kg) (09/11 0500) Weight change: -2 lb 6.8 oz (-1.1 kg)  Intake/Output from previous day: 09/10 0701 - 09/11 0700 In: 926.8 [P.O.:840; I.V.:86.8] Out: 3075 [Urine:3075]  Intake/Output from this shift:    Medications: Current Facility-Administered Medications  Medication Dose Route Frequency Provider Last Rate Last Dose  . 0.9 %  sodium chloride infusion   Intravenous Continuous Pixie Casino, MD      . acetaminophen (TYLENOL) tablet 650 mg  650 mg Oral Q4H PRN Erlene Quan, PA-C   650 mg at 07/14/14 0140  . ALPRAZolam Duanne Moron) tablet 0.25 mg  0.25 mg Oral BID PRN Erlene Quan, PA-C      . antiseptic oral rinse (CPC / CETYLPYRIDINIUM CHLORIDE 0.05%) solution 7 mL  7 mL Mouth Rinse BID Janora Norlander, MD   7 mL at 07/13/14 2200  . aspirin EC tablet 81 mg  81 mg Oral Daily Erlene Quan, PA-C   81 mg at 07/13/14 0858  . carvedilol (COREG) tablet 12.5 mg  12.5 mg Oral BID Lamar Sprinkles, MD   12.5 mg at 07/13/14 2121  . furosemide (LASIX) injection 80 mg  80 mg Intravenous BID Doreene Burke Kilroy, PA-C   80 mg at 07/13/14 1707  . Influenza vac split quadrivalent PF (FLUARIX) injection 0.5 mL  0.5 mL Intramuscular Tomorrow-1000 Pixie Casino, MD      . levalbuterol Penne Lash) nebulizer solution 0.63 mg  0.63 mg Nebulization Q6H PRN Erlene Quan, PA-C   0.63 mg at 07/14/14 0742  . losartan  (COZAAR) tablet 100 mg  100 mg Oral Daily Erlene Quan, PA-C   100 mg at 07/13/14 8366  . nitroGLYCERIN (NITROSTAT) SL tablet 0.4 mg  0.4 mg Sublingual Q5 Min x 3 PRN Luke K Kilroy, PA-C      . potassium chloride SA (K-DUR,KLOR-CON) CR tablet 20 mEq  20 mEq Oral Daily Erlene Quan, PA-C   20 mEq at 07/13/14 0859  . simvastatin (ZOCOR) tablet 40 mg  40 mg Oral QHS Erlene Quan, PA-C   40 mg at 07/13/14 2121  . zolpidem (AMBIEN) tablet 5 mg  5 mg Oral QHS PRN Erlene Quan, PA-C        Physical Exam: General appearance: alert, no distress and on venti mask Neck: JVD - 5 cm above sternal notch and no carotid bruit Lungs: diminished breath sounds bilaterally and wheezes apex - bilateral Heart: regular rate and rhythm and occasional ectopy Abdomen: obese,soft, non-tender, +BS Extremities: extremities normal, atraumatic, no cyanosis or edema Pulses: 2+ and symmetric Skin: Skin color, texture, turgor normal. No rashes or lesions Neurologic: Grossly normal .  Lab Results: Results for orders placed during the hospital encounter of 07/12/14 (from the past 48 hour(s))  I-STAT TROPOININ, ED     Status: None  Collection Time    07/12/14  3:23 PM      Result Value Ref Range   Troponin i, poc 0.03  0.00 - 0.08 ng/mL   Comment 3            Comment: Due to the release kinetics of cTnI,     a negative result within the first hours     of the onset of symptoms does not rule out     myocardial infarction with certainty.     If myocardial infarction is still suspected,     repeat the test at appropriate intervals.  CBC     Status: Abnormal   Collection Time    07/12/14  3:25 PM      Result Value Ref Range   WBC 11.7 (*) 4.0 - 10.5 K/uL   RBC 5.20 (*) 3.87 - 5.11 MIL/uL   Hemoglobin 15.4 (*) 12.0 - 15.0 g/dL   HCT 48.1 (*) 36.0 - 46.0 %   MCV 92.5  78.0 - 100.0 fL   MCH 29.6  26.0 - 34.0 pg   MCHC 32.0  30.0 - 36.0 g/dL   RDW 13.9  11.5 - 15.5 %   Platelets 220  150 - 400 K/uL  BLOOD GAS,  ARTERIAL     Status: Abnormal   Collection Time    07/12/14  3:45 PM      Result Value Ref Range   FIO2 1.00     Delivery systems BILEVEL POSITIVE AIRWAY PRESSURE     Inspiratory PAP 10.0     Expiratory PAP 5.0     pH, Arterial 7.171 (*) 7.350 - 7.450   Comment: CRITICAL RESULT CALLED TO, READ BACK BY AND VERIFIED WITH:     JUDY YOUNG RN AT 1557 BY JESSICA HALEY RRT RCP ON 07/12/2014   pCO2 arterial 57.4 (*) 35.0 - 45.0 mmHg   Comment: CRITICAL RESULT CALLED TO, READ BACK BY AND VERIFIED WITH:     JUDY YOUNG RN AT [place] RRT RCP ON 07/12/2014   pO2, Arterial 70.8 (*) 80.0 - 100.0 mmHg   Bicarbonate 20.1  20.0 - 24.0 mEq/L   TCO2 18.8  0 - 100 mmol/L   Acid-base deficit 7.1 (*) 0.0 - 2.0 mmol/L   O2 Saturation 87.2     Patient temperature 37.0     Collection site LEFT RADIAL     Drawn by [id number]     Sample type ARTERIAL DRAW     Allens test (pass/fail) PASS  PASS  I-STAT CHEM 8, ED     Status: Abnormal   Collection Time    07/12/14  3:48 PM      Result Value Ref Range   Sodium 140  137 - 147 mEq/L   Potassium 3.7  3.7 - 5.3 mEq/L   Chloride 105  96 - 112 mEq/L   BUN 9  6 - 23 mg/dL   Creatinine, Ser 0.90  0.50 - 1.10 mg/dL   Glucose, Bld 228 (*) 70 - 99 mg/dL   Calcium, Ion 1.17  1.13 - 1.30 mmol/L   TCO2 22  0 - 100 mmol/L   Hemoglobin 16.0 (*) 12.0 - 15.0 g/dL   HCT 47.0 (*) 36.0 - 46.0 %  PRO B NATRIURETIC PEPTIDE     Status: Abnormal   Collection Time    07/12/14  3:55 PM      Result Value Ref Range   Pro B Natriuretic peptide (BNP) 8927.0 (*) 0 -  125 pg/mL  MAGNESIUM     Status: None   Collection Time    07/12/14  3:55 PM      Result Value Ref Range   Magnesium 2.0  1.5 - 2.5 mg/dL  PROTIME-INR     Status: Abnormal   Collection Time    07/12/14  3:55 PM      Result Value Ref Range   Prothrombin Time 15.7 (*) 11.6 - 15.2 seconds   INR 1.25  0.00 - 8.29  BASIC METABOLIC PANEL     Status: Abnormal   Collection Time    07/12/14  3:55 PM       Result Value Ref Range   Sodium 139  137 - 147 mEq/L   Potassium 4.4  3.7 - 5.3 mEq/L   Chloride 103  96 - 112 mEq/L   CO2 20  19 - 32 mEq/L   Glucose, Bld 265 (*) 70 - 99 mg/dL   BUN 10  6 - 23 mg/dL   Creatinine, Ser 0.96  0.50 - 1.10 mg/dL   Calcium 8.8  8.4 - 10.5 mg/dL   GFR calc non Af Amer 58 (*) >90 mL/min   GFR calc Af Amer 67 (*) >90 mL/min   Comment: (NOTE)     The eGFR has been calculated using the CKD EPI equation.     This calculation has not been validated in all clinical situations.     eGFR's persistently <90 mL/min signify possible Chronic Kidney     Disease.   Anion gap 16 (*) 5 - 15  TROPONIN I     Status: None   Collection Time    07/12/14  3:55 PM      Result Value Ref Range   Troponin I <0.30  <0.30 ng/mL   Comment:            Due to the release kinetics of cTnI,     a negative result within the first hours     of the onset of symptoms does not rule out     myocardial infarction with certainty.     If myocardial infarction is still suspected,     repeat the test at appropriate intervals.  LACTIC ACID, PLASMA     Status: None   Collection Time    07/12/14  4:20 PM      Result Value Ref Range   Lactic Acid, Venous 1.8  0.5 - 2.2 mmol/L  CULTURE, BLOOD (ROUTINE X 2)     Status: None   Collection Time    07/12/14  4:20 PM      Result Value Ref Range   Specimen Description BLOOD RIGHT ANTECUBITAL     Special Requests BOTTLES DRAWN AEROBIC AND ANAEROBIC 8CC     Culture NO GROWTH 1 DAY     Report Status PENDING    CULTURE, BLOOD (ROUTINE X 2)     Status: None   Collection Time    07/12/14  4:21 PM      Result Value Ref Range   Specimen Description BLOOD LEFT HAND     Special Requests       Value: BOTTLES DRAWN AEROBIC AND ANAEROBIC AEB=10CC ANA=8CC   Culture NO GROWTH 1 DAY     Report Status PENDING    URINALYSIS, ROUTINE W REFLEX MICROSCOPIC     Status: Abnormal   Collection Time    07/12/14  4:30 PM      Result Value Ref Range   Color, Urine  YELLOW  YELLOW   APPearance CLEAR  CLEAR   Specific Gravity, Urine 1.010  1.005 - 1.030   pH 6.0  5.0 - 8.0   Glucose, UA NEGATIVE  NEGATIVE mg/dL   Hgb urine dipstick TRACE (*) NEGATIVE   Bilirubin Urine NEGATIVE  NEGATIVE   Ketones, ur NEGATIVE  NEGATIVE mg/dL   Protein, ur TRACE (*) NEGATIVE mg/dL   Urobilinogen, UA 0.2  0.0 - 1.0 mg/dL   Nitrite NEGATIVE  NEGATIVE   Leukocytes, UA NEGATIVE  NEGATIVE  URINE MICROSCOPIC-ADD ON     Status: Abnormal   Collection Time    07/12/14  4:30 PM      Result Value Ref Range   Squamous Epithelial / LPF FEW (*) RARE   WBC, UA 3-6  <3 WBC/hpf   RBC / HPF 3-6  <3 RBC/hpf   Bacteria, UA FEW (*) RARE   Casts HYALINE CASTS (*) NEGATIVE   Urine-Other YEAST    MRSA PCR SCREENING     Status: None   Collection Time    07/12/14  7:11 PM      Result Value Ref Range   MRSA by PCR NEGATIVE  NEGATIVE   Comment:            The GeneXpert MRSA Assay (FDA     approved for NASAL specimens     only), is one component of a     comprehensive MRSA colonization     surveillance program. It is not     intended to diagnose MRSA     infection nor to guide or     monitor treatment for     MRSA infections.  CBC     Status: None   Collection Time    07/12/14  9:30 PM      Result Value Ref Range   WBC 9.0  4.0 - 10.5 K/uL   RBC 4.71  3.87 - 5.11 MIL/uL   Hemoglobin 13.6  12.0 - 15.0 g/dL   HCT 42.3  36.0 - 46.0 %   MCV 89.8  78.0 - 100.0 fL   MCH 28.9  26.0 - 34.0 pg   MCHC 32.2  30.0 - 36.0 g/dL   RDW 13.6  11.5 - 15.5 %   Platelets 197  150 - 400 K/uL  CREATININE, SERUM     Status: Abnormal   Collection Time    07/12/14  9:30 PM      Result Value Ref Range   Creatinine, Ser 0.90  0.50 - 1.10 mg/dL   GFR calc non Af Amer 63 (*) >90 mL/min   GFR calc Af Amer 73 (*) >90 mL/min   Comment: (NOTE)     The eGFR has been calculated using the CKD EPI equation.     This calculation has not been validated in all clinical situations.     eGFR's persistently  <90 mL/min signify possible Chronic Kidney     Disease.  TSH     Status: None   Collection Time    07/12/14  9:30 PM      Result Value Ref Range   TSH 0.810  0.350 - 4.500 uIU/mL  TROPONIN I     Status: None   Collection Time    07/12/14  9:30 PM      Result Value Ref Range   Troponin I <0.30  <0.30 ng/mL   Comment:            Due to the release kinetics of cTnI,  a negative result within the first hours     of the onset of symptoms does not rule out     myocardial infarction with certainty.     If myocardial infarction is still suspected,     repeat the test at appropriate intervals.  BASIC METABOLIC PANEL     Status: Abnormal   Collection Time    07/13/14  3:15 AM      Result Value Ref Range   Sodium 142  137 - 147 mEq/L   Potassium 3.7  3.7 - 5.3 mEq/L   Chloride 103  96 - 112 mEq/L   CO2 25  19 - 32 mEq/L   Glucose, Bld 107 (*) 70 - 99 mg/dL   BUN 13  6 - 23 mg/dL   Creatinine, Ser 0.91  0.50 - 1.10 mg/dL   Calcium 8.4  8.4 - 10.5 mg/dL   GFR calc non Af Amer 62 (*) >90 mL/min   GFR calc Af Amer 72 (*) >90 mL/min   Comment: (NOTE)     The eGFR has been calculated using the CKD EPI equation.     This calculation has not been validated in all clinical situations.     eGFR's persistently <90 mL/min signify possible Chronic Kidney     Disease.   Anion gap 14  5 - 15  LIPID PANEL     Status: Abnormal   Collection Time    07/13/14  3:15 AM      Result Value Ref Range   Cholesterol 105  0 - 200 mg/dL   Triglycerides 78  <150 mg/dL   HDL 29 (*) >39 mg/dL   Total CHOL/HDL Ratio 3.6     VLDL 16  0 - 40 mg/dL   LDL Cholesterol 60  0 - 99 mg/dL   Comment:            Total Cholesterol/HDL:CHD Risk     Coronary Heart Disease Risk Table                         Men   Women      1/2 Average Risk   3.4   3.3      Average Risk       5.0   4.4      2 X Average Risk   9.6   7.1      3 X Average Risk  23.4   11.0                Use the calculated Patient Ratio     above  and the CHD Risk Table     to determine the patient's CHD Risk.                ATP III CLASSIFICATION (LDL):      <100     mg/dL   Optimal      100-129  mg/dL   Near or Above                        Optimal      130-159  mg/dL   Borderline      160-189  mg/dL   High      >190     mg/dL   Very High  TROPONIN I     Status: None   Collection Time    07/13/14  3:20 AM      Result Value Ref  Range   Troponin I <0.30  <0.30 ng/mL   Comment:            Due to the release kinetics of cTnI,     a negative result within the first hours     of the onset of symptoms does not rule out     myocardial infarction with certainty.     If myocardial infarction is still suspected,     repeat the test at appropriate intervals.  TROPONIN I     Status: Abnormal   Collection Time    07/13/14 10:35 AM      Result Value Ref Range   Troponin I 0.35 (*) <0.30 ng/mL   Comment:            Due to the release kinetics of cTnI,     a negative result within the first hours     of the onset of symptoms does not rule out     myocardial infarction with certainty.     If myocardial infarction is still suspected,     repeat the test at appropriate intervals.     CRITICAL RESULT CALLED TO, READ BACK BY AND VERIFIED WITH:     YOW L RN 07/13/14 1141 COSTELLO B  BASIC METABOLIC PANEL     Status: Abnormal   Collection Time    07/14/14  2:18 AM      Result Value Ref Range   Sodium 142  137 - 147 mEq/L   Potassium 3.8  3.7 - 5.3 mEq/L   Chloride 100  96 - 112 mEq/L   CO2 27  19 - 32 mEq/L   Glucose, Bld 126 (*) 70 - 99 mg/dL   BUN 17  6 - 23 mg/dL   Creatinine, Ser 0.93  0.50 - 1.10 mg/dL   Calcium 8.5  8.4 - 10.5 mg/dL   GFR calc non Af Amer 60 (*) >90 mL/min   GFR calc Af Amer 70 (*) >90 mL/min   Comment: (NOTE)     The eGFR has been calculated using the CKD EPI equation.     This calculation has not been validated in all clinical situations.     eGFR's persistently <90 mL/min signify possible Chronic Kidney       Disease.   Anion gap 15  5 - 15  PRO B NATRIURETIC PEPTIDE     Status: Abnormal   Collection Time    07/14/14  2:18 AM      Result Value Ref Range   Pro B Natriuretic peptide (BNP) 2022.0 (*) 0 - 125 pg/mL    Imaging: Dg Chest Portable 1 View  07/12/2014   CLINICAL DATA:  Shortness of breath  EXAM: PORTABLE CHEST - 1 VIEW  COMPARISON:  01/02/2013  FINDINGS: Cardiac shadow is again enlarged. Diffuse vascular congestion and pulmonary edema is noted. Right basilar infiltrate is noted as well. No acute bony abnormality is seen.  IMPRESSION: Changes consistent with CHF and apparent right basilar infiltrate.   Electronically Signed   By: Inez Catalina M.D.   On: 07/12/2014 15:44    Assessment:  Principal Problem:   Acute respiratory failure with hypoxia- Bi Pap per EMS Active Problems:   DYSLIPIDEMIA   TOBACCO USER   CAD- remote MI '90s, last cath April 2014- RCA disease-med Rx   Chronic combined systolic and diastolic congestive heart failure   Acute pulmonary edema   ICM-last EF 45% by cath, 25% by echo April 2014   Asymptomatic  PVCs   Acute respiratory failure   Plan:  Diuresed well again overnight, now out 3.5L. Continue lasix 80 mg IV BID. Echo shows EF of 20% - Mild troponin elevation, probably due to heart failure. Her NSVT has resolved. Will likely need a L/RHC on Monday. If there is no new obstructive CAD, then will likely need evaluation for AICD. Will ask Dr. Caryl Comes to see her. Add sorbitol for BM. Can transfer to stepdown status.  Time Spent Directly with Patient:  15 minutes  Length of Stay:  LOS: 2 days   Pixie Casino, MD, [facility] Attending Cardiologist [facility]  Lanika Colgate C 07/14/2014, 10:14 AM

## 2014-07-14 NOTE — Consult Note (Signed)
ELECTROPHYSIOLOGY CONSULT NOTE  Patient ID: Nicole Clements, MRN: [id number], DOB/AGE: 04-21-1941 73 y.o. Admit date: 07/12/2014 Date of Consult: 07/14/2014  Primary Physician: Lupita Raider, MD Primary Cardiologist: Dorma Russell (new)  Chief Complaint: LV dysnfucntion   HPI Nicole Clements is a 73 y.o. female  Was admitted following a recurrent episode of shortness of breath that was accompanied by palpitations. She has been having increasingly frequently. Over the last month she has not felt well the last 2 months she has noted increasing dyspnea on exertion. This has not been accompanied by peripheral edema orthopnea or nocturnal dyspnea. She has had no chest discomfort.  Over the last month or so she is also noted episodic shortness of breath that is frequently accompanied by palpitations and associated with lightheadedness. He is are not as severe when they occur when she is seated. They typically lasts 5 minutes or so and then date little residua.  On the day of her admission she began having shortness of breath prior to her scheduled visit with Dr. Wyline Mood. He became increasingly short of breath and 911 was called.  EMS reported short runs of nonsustained ventricular tachycardia. Amiodarone was initiated.    Past Medical History  Diagnosis Date  . Myocardial infarct 07/22/1996    st.elevated inferior wall infarct  . CAD (coronary artery disease) April 2014    RCA disease- Med Rx   . Hypertension   . Hypercholesterolemia   . S/P colonoscopy August 2007    hyperplastic polyps, rare sigmoid and descending colon diverticulosis, small internal hemorrhoids  . CHF (congestive heart failure) 12/31/2012  . Ischemic cardiomyopathy     EF 25%-45%  . PVC's (premature ventricular contractions)   . IVCD (intraventricular conduction defect)       Surgical History:  Past Surgical History  Procedure Laterality Date  . Cardiac catheterization  April 2014    Med Rx  . S/p hysterectomy       Home  Meds: Prior to Admission medications   Medication Sig Start Date End Date Taking? Authorizing Provider  aspirin 325 MG tablet Take 325 mg by mouth daily.     Yes Historical Provider, MD  carvedilol (COREG) 12.5 MG tablet Take 1 tablet (12.5 mg total) by mouth 2 (two) times daily. 06/26/14  Yes Jodelle Gross, NP  Cholecalciferol (VITAMIN D3) 2000 UNITS capsule Take 2,000 Units by mouth daily.     Yes Historical Provider, MD  furosemide (LASIX) 40 MG tablet Take 1 tablet (40 mg total) by mouth daily. 05/18/14  Yes Laqueta Linden, MD  losartan (COZAAR) 100 MG tablet Take 1 tablet (100 mg total) by mouth daily. 07/20/13  Yes Antoine Poche, MD  Omega-3 Fatty Acids (FISH OIL) 1000 MG CAPS Take 1,000 mg by mouth daily.    Yes Historical Provider, MD  potassium chloride SA (K-DUR,KLOR-CON) 20 MEQ tablet Take 1 tablet (20 mEq total) by mouth daily. 05/08/14  Yes Antoine Poche, MD  simvastatin (ZOCOR) 40 MG tablet Take 1 tablet (40 mg total) by mouth at bedtime. 11/01/13  Yes Antoine Poche, MD    Inpatient Medications:  . antiseptic oral rinse  7 mL Mouth Rinse BID  . aspirin EC  81 mg Oral Daily  . carvedilol  12.5 mg Oral BID  . furosemide  80 mg Intravenous BID  . [START ON 07/15/2014] furosemide  80 mg Oral Daily  . losartan  100 mg Oral Daily  . potassium chloride  20 mEq Oral Daily  .  potassium chloride  40 mEq Oral Once  . simvastatin  40 mg Oral QHS      Allergies:  Allergies  Allergen Reactions  . Sulfa Antibiotics Rash    History   Social History  . Marital Status: Single    Spouse Name: N/A    Number of Children: 1  . Years of Education: N/A   Occupational History  . retired     Clinical biochemist rep   Social History Main Topics  . Smoking status: Current Every Day Smoker -- 1.00 packs/day for 50 years  . Smokeless tobacco: Never Used     Comment: down to a half a pack daily for the past month   . Alcohol Use: No  . Drug Use: No  . Sexual Activity: Not  on file   Other Topics Concern  . Not on file   Social History Narrative  . No narrative on file     Family History  Problem Relation Age of Onset  . Colon cancer Neg Hx   . Hypertension Sister   . Diabetes Mellitus II Sister   . Hypertension Brother   . Diabetes Mellitus II Brother   . Hypertension Brother   . Diabetes Mellitus II Brother   . Hypertension Brother   . Diabetes Mellitus II Brother      ROS:  Please see the history of present illness.     All other systems reviewed and negative.    Physical Exam: Blood pressure 139/53, pulse 69, temperature 97.3 F (36.3 C), temperature source Oral, resp. rate 21, height  (1.6 m), weight 166 lb 10.7 oz (75.6 kg), SpO2 91.00%. General: Well developed, well nourished female in no acute distress. Head: Normocephalic, atraumatic, sclera non-icteric, no xanthomas, nares are without discharge. EENT: normal Lymph Nodes:  none Back: without scoliosis/kyphosis* , no CVA tendersness Neck: Negative for carotid bruits. JVD not elevated. Lungs: Decreased breath sounds right base Breathing is unlabored. Heart: RRR with S1 S2. No murmur , rubs, or gallops appreciated. Abdomen: Soft, non-tender, non-distended with normoactive bowel sounds. No hepatomegaly. No rebound/guarding. No obvious abdominal masses. Msk:  Strength and tone appear normal for age. Extremities: No clubbing or cyanosis. No * edema.  Distal pedal pulses are 2+ and equal bilaterally. Skin: Warm and Dry Neuro: Alert and oriented X 3. CN III-XII intact Grossly normal sensory and motor function . Psych:  Responds to questions appropriately with a normal affect.      Labs: Cardiac Enzymes  Recent Labs  07/12/14 1555 07/12/14 2130 07/13/14 0320 07/13/14 1035  TROPONINI <0.30 <0.30 <0.30 0.35*   CBC Lab Results  Component Value Date   WBC 9.0 07/12/2014   HGB 13.6 07/12/2014   HCT 42.3 07/12/2014   MCV 89.8 07/12/2014   PLT 197 07/12/2014   PROTIME:  Recent  Labs  07/12/14 1555  LABPROT 15.7*  INR 1.25   Chemistry  Recent Labs Lab 07/14/14 0218  NA 142  K 3.8  CL 100  CO2 27  BUN 17  CREATININE 0.93  CALCIUM 8.5  GLUCOSE 126*   Lipids Lab Results  Component Value Date   CHOL 105 07/13/2014   HDL 29* 07/13/2014   LDLCALC 60 07/13/2014   TRIG 78 07/13/2014   BNP Pro B Natriuretic peptide (BNP)  Date/Time Value Ref Range Status  07/14/2014  2:18 AM 2022.0* 0 - 125 pg/mL Final  07/12/2014  3:55 PM 8927.0* 0 - 125 pg/mL Final  01/02/2013  5:21 AM 2144.0* 0 -  125 pg/mL Final  12/31/2012  3:42 PM 6101.0* 0 - 125 pg/mL Final   Miscellaneous No results found for this basename: DDIMER    Radiology/Studies:  Dg Chest Portable 1 View  07/12/2014   CLINICAL DATA:  Shortness of breath  EXAM: PORTABLE CHEST - 1 VIEW  COMPARISON:  01/02/2013  FINDINGS: Cardiac shadow is again enlarged. Diffuse vascular congestion and pulmonary edema is noted. Right basilar infiltrate is noted as well. No acute bony abnormality is seen.  IMPRESSION: Changes consistent with CHF and apparent right basilar infiltrate.   Electronically Signed   By: Alcide Clever M.D.   On: 07/12/2014 15:44    EKG:  Sinus 75 [id number]  Axis 222  Assessment and Plan:  Cardiomyopathy with ischemic and nonischemic components  Palpitations  Ventricular tachycardia-nonsustained  Abnormal ECG with right axis deviation (normal right-sided pressures by echo)  Cigarette abuse  Congestive heart failure-chronic-systolic/diastolic  IVCD/right axis deviation  The patient has a very low left ventricular function. Currently it has been variable, but currently by echo is measured about 20%. In the past there was disordance between echo assessment and  catheterization Assessment and so we will wait catheterization results.  Her slight troponin bump is a we'll certainly demand ischemia in the setting of heart failure.  She has been compliant with her medications; it would be reasonable to  add hydralazine/nitrates although not sure of  how much impact this would have on her ejection fraction.  I don't think that there is an acute indication for ICD implantation.  She will need a 30 day event recorder at discharge try to identify if there is an arrhythmia that is associated with the palpitations that she describes accompanying her  shortness of breath   her IVCD makes the potential benefit of CRT significantly attenuated.   She has been advised to stop smoking. I've advised her that patches are available at 1 800quitnow .  Decrease her aspirin dose to 81 mg unless there is a compelling reason not to    [employer]

## 2014-07-15 DIAGNOSIS — F17200 Nicotine dependence, unspecified, uncomplicated: Secondary | ICD-10-CM

## 2014-07-15 LAB — BASIC METABOLIC PANEL
ANION GAP: 11 (ref 5–15)
BUN: 12 mg/dL (ref 6–23)
CO2: 29 mEq/L (ref 19–32)
Calcium: 8.9 mg/dL (ref 8.4–10.5)
Chloride: 100 mEq/L (ref 96–112)
Creatinine, Ser: 0.73 mg/dL (ref 0.50–1.10)
GFR calc Af Amer: >90 mL/min (ref >90)
GFR, EST NON AFRICAN AMERICAN: 84 mL/min — AB (ref >90)
Glucose, Bld: 102 mg/dL — ABNORMAL HIGH (ref 70–99)
POTASSIUM: 4 meq/L (ref 3.7–5.3)
SODIUM: 140 meq/L (ref 137–147)

## 2014-07-15 MED ORDER — SPIRONOLACTONE 12.5 MG HALF TABLET
12.5000 mg | ORAL_TABLET | Freq: Every day | ORAL | Status: DC
  Administered 2014-07-15 – 2014-07-18 (×4): 12.5 mg via ORAL
  Filled 2014-07-15 (×4): qty 1

## 2014-07-15 NOTE — Progress Notes (Addendum)
DAILY PROGRESS NOTE  Subjective:   73 y/o, obese woman with with a history of COPD with ongoing tobacco use, CAD and cardiomyopathy. She apparently had a remote STEMI in the '90s. She has had depressed EF by echo- 25% but not by cath April 2014- 45%. At cath she had diffuse RCA disease an non critical LAD disease to be treated medically.   Admitted with recurrent HF and NSVT. ECHO 9/10 with LVEF 20%. Mild to mod MR. RV normal.   Weight down another 3 pounds. Breathing much better. Decreased oxygen requirement. No CP. Lasix switched to po this am.   Objective:  Temp:  [97.4 F (36.3 C)-98 F (36.7 C)] 97.4 F (36.3 C) (09/12 1201) Pulse Rate:  [52-89] 89 (09/12 1201) Resp:  [15-21] 21 (09/12 1201) BP: (93-144)/(36-89) 93/36 mmHg (09/12 1201) SpO2:  [92 %-98 %] 95 % (09/12 1201) Weight:  [74.2 kg (163 lb 9.3 oz)] 74.2 kg (163 lb 9.3 oz) (09/12 0354) Weight change: -1.4 kg (-3 lb 1.4 oz)  Intake/Output from previous day: 09/11 0701 - 09/12 0700 In: 820 [P.O.:820] Out: 2350 [Urine:2350]  Intake/Output from this shift: Total I/O In: 240 [P.O.:240] Out: 325 [Urine:325]  Medications: Current Facility-Administered Medications  Medication Dose Route Frequency Provider Last Rate Last Dose  . 0.9 %  sodium chloride infusion   Intravenous Continuous Pixie Casino, MD      . acetaminophen (TYLENOL) tablet 650 mg  650 mg Oral Q4H PRN Erlene Quan, PA-C   650 mg at 07/14/14 2124  . ALPRAZolam Duanne Moron) tablet 0.25 mg  0.25 mg Oral BID PRN Erlene Quan, PA-C      . antiseptic oral rinse (CPC / CETYLPYRIDINIUM CHLORIDE 0.05%) solution 7 mL  7 mL Mouth Rinse BID Janora Norlander, MD   7 mL at 07/14/14 2200  . aspirin EC tablet 81 mg  81 mg Oral Daily Erlene Quan, PA-C   81 mg at 07/15/14 7353  . carvedilol (COREG) tablet 12.5 mg  12.5 mg Oral BID Lamar Sprinkles, MD   12.5 mg at 07/15/14 0942  . furosemide (LASIX) tablet 80 mg  80 mg Oral Daily Pixie Casino, MD   80 mg at 07/15/14  2992  . levalbuterol (XOPENEX) nebulizer solution 0.63 mg  0.63 mg Nebulization Q6H PRN Erlene Quan, PA-C   0.63 mg at 07/14/14 0742  . losartan (COZAAR) tablet 100 mg  100 mg Oral Daily Erlene Quan, PA-C   100 mg at 07/15/14 4268  . nitroGLYCERIN (NITROSTAT) SL tablet 0.4 mg  0.4 mg Sublingual Q5 Min x 3 PRN Luke K Kilroy, PA-C      . potassium chloride SA (K-DUR,KLOR-CON) CR tablet 20 mEq  20 mEq Oral Daily Erlene Quan, PA-C   20 mEq at 07/15/14 3419  . simvastatin (ZOCOR) tablet 40 mg  40 mg Oral QHS Erlene Quan, PA-C   40 mg at 07/14/14 2124  . sorbitol 70 % solution 30 mL  30 mL Oral Daily PRN Pixie Casino, MD   30 mL at 07/14/14 1238  . zolpidem (AMBIEN) tablet 5 mg  5 mg Oral QHS PRN Erlene Quan, PA-C        Physical Exam: General appearance: alert, no distress and on venti mask Neck: JVD - 5 cm above sternal notch and no carotid bruit Lungs: diminished breath sounds bilaterally and wheezes apex - bilateral Heart: regular rate and rhythm and occasional ectopy Abdomen: obese,soft, non-tender, +BS  Extremities: extremities normal, atraumatic, no cyanosis or edema Pulses: 2+ and symmetric Skin: Skin color, texture, turgor normal. No rashes or lesions Neurologic: Grossly normal .  Lab Results: Results for orders placed during the hospital encounter of 07/12/14 (from the past 48 hour(s))  BASIC METABOLIC PANEL     Status: Abnormal   Collection Time    07/14/14  2:18 AM      Result Value Ref Range   Sodium 142  137 - 147 mEq/L   Potassium 3.8  3.7 - 5.3 mEq/L   Chloride 100  96 - 112 mEq/L   CO2 27  19 - 32 mEq/L   Glucose, Bld 126 (*) 70 - 99 mg/dL   BUN 17  6 - 23 mg/dL   Creatinine, Ser 0.93  0.50 - 1.10 mg/dL   Calcium 8.5  8.4 - 10.5 mg/dL   GFR calc non Af Amer 60 (*) >90 mL/min   GFR calc Af Amer 70 (*) >90 mL/min   Comment: (NOTE)     The eGFR has been calculated using the CKD EPI equation.     This calculation has not been validated in all clinical  situations.     eGFR's persistently <90 mL/min signify possible Chronic Kidney     Disease.   Anion gap 15  5 - 15  PRO B NATRIURETIC PEPTIDE     Status: Abnormal   Collection Time    07/14/14  2:18 AM      Result Value Ref Range   Pro B Natriuretic peptide (BNP) 2022.0 (*) 0 - 125 pg/mL  BASIC METABOLIC PANEL     Status: Abnormal   Collection Time    07/15/14  3:56 AM      Result Value Ref Range   Sodium 140  137 - 147 mEq/L   Potassium 4.0  3.7 - 5.3 mEq/L   Chloride 100  96 - 112 mEq/L   CO2 29  19 - 32 mEq/L   Glucose, Bld 102 (*) 70 - 99 mg/dL   BUN 12  6 - 23 mg/dL   Creatinine, Ser 0.73  0.50 - 1.10 mg/dL   Calcium 8.9  8.4 - 10.5 mg/dL   GFR calc non Af Amer 84 (*) >90 mL/min   GFR calc Af Amer >90  >90 mL/min   Comment: (NOTE)     The eGFR has been calculated using the CKD EPI equation.     This calculation has not been validated in all clinical situations.     eGFR's persistently <90 mL/min signify possible Chronic Kidney     Disease.   Anion gap 11  5 - 15    Imaging: No results found.  Assessment:  1. Acute on chronic systolic HF EF 73% 2. Acute respiratory failure with hypoxia- Bi Pap per EMS 3. CAD- remote MI '90s, last cath April 2014- RCA disease-med Rx 4. NSVT 5. Morbid obesity 6. COPD with ongoing tobacco use 7. Elevated troponin  Plan:  Much improved. Agree with switch to po lasix. Continue carvedilol (can switch to bisoprolol as needed) and losartan. Add spiro.  Will  need a L/RHC on Monday. Will need LifeVest on d/c. Can go to tele. Cath orders written.   Length of Stay:  LOS: 3 days   Glori Bickers MD 07/15/2014, 1:12 PM

## 2014-07-16 DIAGNOSIS — I2510 Atherosclerotic heart disease of native coronary artery without angina pectoris: Secondary | ICD-10-CM

## 2014-07-16 DIAGNOSIS — I5023 Acute on chronic systolic (congestive) heart failure: Secondary | ICD-10-CM

## 2014-07-16 LAB — BASIC METABOLIC PANEL
Anion gap: 12 (ref 5–15)
BUN: 11 mg/dL (ref 6–23)
CHLORIDE: 97 meq/L (ref 96–112)
CO2: 29 meq/L (ref 19–32)
Calcium: 9.3 mg/dL (ref 8.4–10.5)
Creatinine, Ser: 0.75 mg/dL (ref 0.50–1.10)
GFR calc Af Amer: >90 mL/min (ref >90)
GFR calc non Af Amer: 83 mL/min — ABNORMAL LOW (ref >90)
GLUCOSE: 103 mg/dL — AB (ref 70–99)
POTASSIUM: 3.9 meq/L (ref 3.7–5.3)
SODIUM: 138 meq/L (ref 137–147)

## 2014-07-16 LAB — CBC
HCT: 45.2 % (ref 36.0–46.0)
Hemoglobin: 14.6 g/dL (ref 12.0–15.0)
MCH: 28.7 pg (ref 26.0–34.0)
MCHC: 32.3 g/dL (ref 30.0–36.0)
MCV: 89 fL (ref 78.0–100.0)
Platelets: 234 10*3/uL (ref 150–400)
RBC: 5.08 MIL/uL (ref 3.87–5.11)
RDW: 13.3 % (ref 11.5–15.5)
WBC: 7.2 10*3/uL (ref 4.0–10.5)

## 2014-07-16 MED ORDER — SODIUM CHLORIDE 0.9 % IV SOLN: 250.0000 mL | INTRAVENOUS | Status: DC | PRN

## 2014-07-16 MED ORDER — SODIUM CHLORIDE 0.9 % IJ SOLN: 3.0000 mL | INTRAMUSCULAR | Status: DC | PRN

## 2014-07-16 MED ORDER — ASPIRIN 81 MG PO CHEW
81.0000 mg | CHEWABLE_TABLET | ORAL | Status: AC
  Administered 2014-07-17: 81 mg via ORAL
  Filled 2014-07-16: qty 1

## 2014-07-16 MED ORDER — SODIUM CHLORIDE 0.9 % IJ SOLN: 3.0000 mL | Freq: Two times a day (BID) | INTRAMUSCULAR | Status: DC

## 2014-07-16 NOTE — Progress Notes (Signed)
DAILY PROGRESS NOTE  Subjective:   73 y/o, obese woman with with a history of COPD with ongoing tobacco use, CAD and cardiomyopathy. She apparently had a remote STEMI in the '90s. She has had depressed EF by echo- 25% but not by cath April 2014- 45%. At cath she had diffuse RCA disease an non critical LAD disease to be treated medically.   Admitted with recurrent HF and NSVT. ECHO 9/10 with LVEF 20%. Mild to mod MR. RV normal.   Switched to po lasix yesterday. I/o -1.3L but weight up 2 pounds. Breathing fine.  No CP or edema.   Objective:  Temp:  [97.3 F (36.3 C)-98.1 F (36.7 C)] 97.5 F (36.4 C) (09/13 1130) Pulse Rate:  [55-104] 64 (09/13 1130) Resp:  [16-22] 20 (09/13 1130) BP: (108-146)/(51-90) 137/63 mmHg (09/13 1130) SpO2:  [90 %-98 %] 95 % (09/13 1130) Weight:  [74.934 kg (165 lb 3.2 oz)] 74.934 kg (165 lb 3.2 oz) (09/13 0321) Weight change: 0.734 kg (1 lb 9.9 oz)  Intake/Output from previous day: 09/12 0701 - 09/13 0700 In: 540 [P.O.:540] Out: 1925 [Urine:1925]  Intake/Output from this shift: Total I/O In: 360 [P.O.:360] Out: 500 [Urine:500]  Medications: Current Facility-Administered Medications  Medication Dose Route Frequency Provider Last Rate Last Dose  . 0.9 %  sodium chloride infusion   Intravenous Continuous Pixie Casino, MD      . acetaminophen (TYLENOL) tablet 650 mg  650 mg Oral Q4H PRN Erlene Quan, PA-C   650 mg at 07/14/14 2124  . ALPRAZolam Duanne Moron) tablet 0.25 mg  0.25 mg Oral BID PRN Erlene Quan, PA-C      . antiseptic oral rinse (CPC / CETYLPYRIDINIUM CHLORIDE 0.05%) solution 7 mL  7 mL Mouth Rinse BID Janora Norlander, MD   7 mL at 07/16/14 1000  . aspirin EC tablet 81 mg  81 mg Oral Daily Erlene Quan, PA-C   81 mg at 07/16/14 1036  . carvedilol (COREG) tablet 12.5 mg  12.5 mg Oral BID Lamar Sprinkles, MD   12.5 mg at 07/15/14 2214  . furosemide (LASIX) tablet 80 mg  80 mg Oral Daily Pixie Casino, MD   80 mg at 07/16/14 1037  .  levalbuterol (XOPENEX) nebulizer solution 0.63 mg  0.63 mg Nebulization Q6H PRN Erlene Quan, PA-C   0.63 mg at 07/14/14 0742  . losartan (COZAAR) tablet 100 mg  100 mg Oral Daily Erlene Quan, PA-C   100 mg at 07/16/14 1036  . nitroGLYCERIN (NITROSTAT) SL tablet 0.4 mg  0.4 mg Sublingual Q5 Min x 3 PRN Luke K Kilroy, PA-C      . potassium chloride SA (K-DUR,KLOR-CON) CR tablet 20 mEq  20 mEq Oral Daily Erlene Quan, PA-C   20 mEq at 07/16/14 1036  . simvastatin (ZOCOR) tablet 40 mg  40 mg Oral QHS Erlene Quan, PA-C   40 mg at 07/15/14 2214  . sorbitol 70 % solution 30 mL  30 mL Oral Daily PRN Pixie Casino, MD   30 mL at 07/14/14 1238  . spironolactone (ALDACTONE) tablet 12.5 mg  12.5 mg Oral Daily Jolaine Artist, MD   12.5 mg at 07/16/14 1037  . zolpidem (AMBIEN) tablet 5 mg  5 mg Oral QHS PRN Erlene Quan, PA-C        Physical Exam: General appearance: alert, no distress sitting in chair Neck: JVP flat Lungs: diminished breath sounds bilaterally and wheezes apex -  bilateral Heart: regular rate and rhythm and occasional ectopy Abdomen: obese,soft, non-tender, +BS Extremities: extremities normal, atraumatic, no cyanosis or edema Pulses: 2+ and symmetric Skin: Skin color, texture, turgor normal. No rashes or lesions Neurologic: Grossly normal .  Lab Results: Results for orders placed during the hospital encounter of 07/12/14 (from the past 48 hour(s))  BASIC METABOLIC PANEL     Status: Abnormal   Collection Time    07/15/14  3:56 AM      Result Value Ref Range   Sodium 140  137 - 147 mEq/L   Potassium 4.0  3.7 - 5.3 mEq/L   Chloride 100  96 - 112 mEq/L   CO2 29  19 - 32 mEq/L   Glucose, Bld 102 (*) 70 - 99 mg/dL   BUN 12  6 - 23 mg/dL   Creatinine, Ser 0.73  0.50 - 1.10 mg/dL   Calcium 8.9  8.4 - 10.5 mg/dL   GFR calc non Af Amer 84 (*) >90 mL/min   GFR calc Af Amer >90  >90 mL/min   Comment: (NOTE)     The eGFR has been calculated using the CKD EPI equation.      This calculation has not been validated in all clinical situations.     eGFR's persistently <90 mL/min signify possible Chronic Kidney     Disease.   Anion gap 11  5 - 15  BASIC METABOLIC PANEL     Status: Abnormal   Collection Time    07/16/14  5:00 AM      Result Value Ref Range   Sodium 138  137 - 147 mEq/L   Potassium 3.9  3.7 - 5.3 mEq/L   Chloride 97  96 - 112 mEq/L   CO2 29  19 - 32 mEq/L   Glucose, Bld 103 (*) 70 - 99 mg/dL   BUN 11  6 - 23 mg/dL   Creatinine, Ser 0.75  0.50 - 1.10 mg/dL   Calcium 9.3  8.4 - 10.5 mg/dL   GFR calc non Af Amer 83 (*) >90 mL/min   GFR calc Af Amer >90  >90 mL/min   Comment: (NOTE)     The eGFR has been calculated using the CKD EPI equation.     This calculation has not been validated in all clinical situations.     eGFR's persistently <90 mL/min signify possible Chronic Kidney     Disease.   Anion gap 12  5 - 15    Imaging: No results found.  Assessment:  1. Acute on chronic systolic HF EF 16% 2. Acute respiratory failure with hypoxia- Bi Pap per EMS 3. CAD- remote MI '90s, last cath April 2014- RCA disease-med Rx 4. NSVT 5. Morbid obesity 6. COPD with ongoing tobacco use 7. Elevated troponin  Plan:  Much improved. Volume status looks good. Continue po lasix. Can uptitrate as needed. Continue carvedilol (can switch to bisoprolol as needed) and losartan. Arlyce Harman added yesterday.  [facility] tomorrow - we discussed procedure. Will need LifeVest on d/c. Can go to tele. Cath orders written.   Length of Stay:  LOS: 4 days   Glori Bickers MD 07/16/2014, 12:33 PM

## 2014-07-17 ENCOUNTER — Encounter (HOSPITAL_COMMUNITY)
Admission: EM | Disposition: A | Discharge status: Home / Self Care | Payer: Medicare Other | Source: Home / Self Care | Attending: Internal Medicine

## 2014-07-17 DIAGNOSIS — I2510 Atherosclerotic heart disease of native coronary artery without angina pectoris: Secondary | ICD-10-CM

## 2014-07-17 HISTORY — PX: LEFT AND RIGHT HEART CATHETERIZATION WITH CORONARY ANGIOGRAM: SHX5449

## 2014-07-17 HISTORY — PX: PERCUTANEOUS CORONARY STENT INTERVENTION (PCI-S): SHX5485

## 2014-07-17 LAB — CULTURE, BLOOD (ROUTINE X 2)
Culture: NO GROWTH
Culture: NO GROWTH

## 2014-07-17 LAB — PROTIME-INR
INR: 1.45 (ref 0.00–1.49)
Prothrombin Time: 17.6 seconds — ABNORMAL HIGH (ref 11.6–15.2)

## 2014-07-17 LAB — BASIC METABOLIC PANEL
ANION GAP: 12 (ref 5–15)
BUN: 11 mg/dL (ref 6–23)
CO2: 28 meq/L (ref 19–32)
Calcium: 9.2 mg/dL (ref 8.4–10.5)
Chloride: 98 mEq/L (ref 96–112)
Creatinine, Ser: 0.71 mg/dL (ref 0.50–1.10)
GFR calc non Af Amer: 85 mL/min — ABNORMAL LOW (ref >90)
Glucose, Bld: 96 mg/dL (ref 70–99)
POTASSIUM: 4.6 meq/L (ref 3.7–5.3)
Sodium: 138 mEq/L (ref 137–147)

## 2014-07-17 LAB — CK TOTAL AND CKMB (NOT AT ARMC)
CK TOTAL: 85 U/L (ref 7–177)
CK TOTAL: 81 U/L (ref 7–177)
CK, MB: 1.7 ng/mL (ref 0.3–4.0)
CK, MB: 2.5 ng/mL (ref 0.3–4.0)
RELATIVE INDEX: INVALID (ref 0.0–2.5)
Relative Index: INVALID (ref 0.0–2.5)

## 2014-07-17 LAB — TROPONIN I
Troponin I: <0.3 ng/mL (ref <0.30)
Troponin I: <0.3 ng/mL (ref <0.30)

## 2014-07-17 SURGERY — LEFT AND RIGHT HEART CATHETERIZATION WITH CORONARY ANGIOGRAM
Anesthesia: LOCAL

## 2014-07-17 MED ORDER — CLOPIDOGREL BISULFATE 75 MG PO TABS
75.0000 mg | ORAL_TABLET | Freq: Every day | ORAL | Status: DC
  Administered 2014-07-18 – 2014-07-19 (×2): 75 mg via ORAL
  Filled 2014-07-17 (×2): qty 1

## 2014-07-17 MED ORDER — VERAPAMIL HCL 2.5 MG/ML IV SOLN
INTRAVENOUS | Status: DC
Start: 2014-07-17 — End: 2014-07-17
  Filled 2014-07-17: qty 2

## 2014-07-17 MED ORDER — NITROGLYCERIN 1 MG/10 ML FOR IR/CATH LAB
INTRA_ARTERIAL | Status: AC
  Filled 2014-07-17: qty 10

## 2014-07-17 MED ORDER — FENTANYL CITRATE 0.05 MG/ML IJ SOLN
INTRAMUSCULAR | Status: AC
  Filled 2014-07-17: qty 2

## 2014-07-17 MED ORDER — HEPARIN SODIUM (PORCINE) 1000 UNIT/ML IJ SOLN
INTRAMUSCULAR | Status: AC
  Filled 2014-07-17: qty 1

## 2014-07-17 MED ORDER — ONDANSETRON HCL 4 MG/2ML IJ SOLN: 4.0000 mg | Freq: Four times a day (QID) | INTRAMUSCULAR | Status: DC | PRN

## 2014-07-17 MED ORDER — BIVALIRUDIN 250 MG IV SOLR
INTRAVENOUS | Status: AC
  Filled 2014-07-17: qty 250

## 2014-07-17 MED ORDER — CLOPIDOGREL BISULFATE 300 MG PO TABS
ORAL_TABLET | ORAL | Status: AC
  Filled 2014-07-17: qty 2

## 2014-07-17 MED ORDER — LIDOCAINE HCL (PF) 1 % IJ SOLN
INTRAMUSCULAR | Status: AC
  Filled 2014-07-17: qty 30

## 2014-07-17 MED ORDER — HEPARIN (PORCINE) IN NACL 2-0.9 UNIT/ML-% IJ SOLN
INTRAMUSCULAR | Status: AC
  Filled 2014-07-17: qty 1500

## 2014-07-17 MED ORDER — MIDAZOLAM HCL 2 MG/2ML IJ SOLN
INTRAMUSCULAR | Status: AC
  Filled 2014-07-17: qty 2

## 2014-07-17 MED ORDER — OXYCODONE-ACETAMINOPHEN 5-325 MG PO TABS: 1.0000 | ORAL_TABLET | ORAL | Status: DC | PRN

## 2014-07-17 NOTE — Interval H&P Note (Signed)
History and Physical Interval Note:  07/17/2014 7:37 AM  Nicole Clements  has presented today for surgery, with the diagnosis of chest pain, shortness of breath  The various methods of treatment have been discussed with the patient and family. After consideration of risks, benefits and other options for treatment, the patient has consented to  Procedure(s): LEFT AND RIGHT HEART CATHETERIZATION WITH CORONARY ANGIOGRAM (N/A) as a surgical intervention .  The patient's history has been reviewed, patient examined, no change in status, stable for surgery.  I have reviewed the patient's chart and labs.  Questions were answered to the patient's satisfaction.    Cath Lab Visit (complete for each Cath Lab visit)  Clinical Evaluation Leading to the Procedure:   ACS: Yes.    Non-ACS:    Anginal Classification: CCS IV  Anti-ischemic medical therapy: Minimal Therapy (1 class of medications)  Non-Invasive Test Results: No non-invasive testing performed  Prior CABG: No previous CABG       Tonny Bollman

## 2014-07-17 NOTE — CV Procedure (Addendum)
Cardiac Catheterization Procedure Note  Name: Nicole Clements MRN: [id number] DOB: [birth year]  Procedure: Left Heart Cath, Right heart catheterization, Selective Coronary Angiography, LV angiography,  PTCA/Stent of distal RCA  Indication: 73 year-old woman with known CAD. She's had a remote inferior wall MI and previous PCI (balloon angioplasty). She presented with recurrent heart failure and anginal symptoms. Her troponin was elevated and she was referred back for cardiac catheterization.  Diagnostic Procedure Details: The right groin was prepped, draped, and anesthetized with 1% lidocaine. Using the modified Seldinger technique, a 5 French sheath was introduced into the right femoral artery and a 7 French sheath was introduced in the right femoral vein. Standard Judkins catheters were used for selective coronary angiography and left ventriculography. Right heart catheterization was performed with a Swan-Ganz catheter. Fick cardiac output was calculated. Catheter exchanges were performed over a wire.  The diagnostic procedure was well-tolerated without immediate complications.  PROCEDURAL FINDINGS Hemodynamics: AO 122/48 LV 118/14 RA 9 RV 41/11 PA 37/5 with a mean of 21 PCWP 27  Oxygen saturations AO 90 PA 56  Cardiac output 3.5 Cardiac index 2.0  Coronary angiography: Coronary dominance: right  Left mainstem: The left mainstem is patent with 30% distal left main stenosis.  Left anterior descending (LAD): The LAD is diffusely diseased in the midportion. There are sequential 50% stenoses, unchanged from previous study. The first diagonal branch is large in distribution with diffuse nonobstructive disease noted. The mid and distal LAD are patent without significant disease.  Left circumflex (LCx): The left circumflex has 50% ostial stenosis, 50% mid vessel stenosis, and to OM branches with no significant disease. There is an intermediate branch is diffusely diseased with up to 70%  stenosis. This vessel is small in caliber.  Right coronary artery (RCA): Dominant vessel. Tortuous with diffuse nonobstructive stenosis throughout the proximal and mid portions. There are diffuse 50% stenosis present. The distal vessel has an ulcerated-appearing irregular 90% stenosis involving the bifurcation of the PDA and posterolateral branches. The posterolateral branches are patent with mild diffuse disease. The PDA arises an acute angulation with 30-40% stenosis. The mid body of the PDA has an 80-90% stenosis in an area where the vessel is too small for PCI.  Left ventriculography: The ventricle appears the synchronous. The basal and midinferior wall are akinetic. The basal inferolateral wall is severely hypokinetic. The anterolateral wall and apex contract normally. The estimated LVEF is 30%.  PCI Procedure Note:  Following the diagnostic procedure, the decision was made to proceed with PCI of the distal RCA. The lesion is complex, there is a large amount of myocardium involved. I felt there was a risk of potentially compromising the PDA branch, but overall in my opinion the risk/benefit was favorable for PCI considering the severe stenosis involving the entire RCA distribution. The sheath was upsized to a 6 [place]. Weight-based bivalirudin was given for anticoagulation. The patient was loaded with Plavix 600 mg on the table. Once a therapeutic ACT was achieved, a 6 [place] JR 4 guide catheter was inserted.  A cougar coronary guidewire was used to cross the lesion into the PLA branch.  The lesion was predilated with a 2.5 x 15 mm balloon.  The lesion was then stented with a 3.0 by 72mm Promus DES stent.  The stent was postdilated with a 3.25 mm noncompliant balloon to 18 atmospheres.  The PDA was stented across as the stent extended from the distal RCA proximal PLA branch. The PDA was totally occluded. I  made a long attempt with a whisper wire using multiple bands on the tip of the wire, but all of  these attempts were unsuccessful at rewiring the PDA. The patient was completely asymptomatic and specifically denied any chest pain. Her hemodynamics remained stable and there were no changes in her rhythm or blood pressure. A final image of the left coronary artery was obtained and this demonstrated a collateral from the LAD to the PDA branch. Following PCI, there was 0% residual stenosis and TIMI-3 flow. Final angiography confirmed an excellent result. Femoral hemostasis was achieved will be achieved with manual hemostasis.  The patient tolerated the PCI procedure well. There were no immediate procedural complications.  The patient was transferred to the post catheterization recovery area for further monitoring.  PCI Data: Vessel - RCA/Segment - distal Percent Stenosis (pre)  90 TIMI-flow 3 Stent 3.0 by 16mm Promus DES Percent Stenosis (post) 0 TIMI-flow (post) 3  Contrast: 175 cc Omnipaque  Radiation dose/Fluoro time: 15.5 minutes  Estimated Blood Loss: Minimal  Final Conclusions:   1. Nonobstructive disease involving the LAD, left circumflex, and OM/diagonal side branches 2. Severe stenosis of the distal RCA bifurcation, treated successfully with a drug-eluting stent into the PLA branch with residual occlusion of the PDA branch 3. Severe segmental LV systolic dysfunction  Recommendations: Dual antiplatelet therapy with aspirin and Plavix for at least 12 months. Will cycle cardiac enzymes since the PDA branch is occluded after stenting.  Tonny Bollman MD, [facility] 07/17/2014, 9:06 AM

## 2014-07-17 NOTE — Progress Notes (Signed)
  Site area: right groin  Site Prior to Removal:  Level 0  Pressure Applied For 30 MINUTES    Minutes Beginning at 1133  Manual:   Yes.    Patient Status During Pull:  stable  Post Pull Groin Site:  Level 0  Post Pull Instructions Given:  Yes.    Post Pull Pulses Present:  Yes.    Dressing Applied:  Yes.  gauze pressure dressing secured with medipore tape.  Comments:  Sheath pulled by Marthenia Rolling, Roma Kayser present during sheath pull and observing Byrd Hesselbach.

## 2014-07-17 NOTE — Progress Notes (Signed)
Bipap order was old and an ED order.

## 2014-07-17 NOTE — H&P (View-Only) (Signed)
  DAILY PROGRESS NOTE  Subjective:   73 y/o, obese woman with with a history of COPD with ongoing tobacco use, CAD and cardiomyopathy. She apparently had a remote STEMI in the '90s. She has had depressed EF by echo- 25% but not by cath April 2014- 45%. At cath she had diffuse RCA disease an non critical LAD disease to be treated medically.   Admitted with recurrent HF and NSVT. ECHO 9/10 with LVEF 20%. Mild to mod MR. RV normal.   Switched to po lasix yesterday. I/o -1.3L but weight up 2 pounds. Breathing fine.  No CP or edema.   Objective:  Temp:  [97.3 F (36.3 C)-98.1 F (36.7 C)] 97.5 F (36.4 C) (09/13 1130) Pulse Rate:  [55-104] 64 (09/13 1130) Resp:  [16-22] 20 (09/13 1130) BP: (108-146)/(51-90) 137/63 mmHg (09/13 1130) SpO2:  [90 %-98 %] 95 % (09/13 1130) Weight:  [74.934 kg (165 lb 3.2 oz)] 74.934 kg (165 lb 3.2 oz) (09/13 0321) Weight change: 0.734 kg (1 lb 9.9 oz)  Intake/Output from previous day: 09/12 0701 - 09/13 0700 In: 540 [P.O.:540] Out: 1925 [Urine:1925]  Intake/Output from this shift: Total I/O In: 360 [P.O.:360] Out: 500 [Urine:500]  Medications: Current Facility-Administered Medications  Medication Dose Route Frequency Provider Last Rate Last Dose  . 0.9 %  sodium chloride infusion   Intravenous Continuous Kenneth C. Hilty, MD      . acetaminophen (TYLENOL) tablet 650 mg  650 mg Oral Q4H PRN Luke K Kilroy, PA-C   650 mg at 07/14/14 2124  . ALPRAZolam (XANAX) tablet 0.25 mg  0.25 mg Oral BID PRN Luke K Kilroy, PA-C      . antiseptic oral rinse (CPC / CETYLPYRIDINIUM CHLORIDE 0.05%) solution 7 mL  7 mL Mouth Rinse BID Dwijesh Patel, MD   7 mL at 07/16/14 1000  . aspirin EC tablet 81 mg  81 mg Oral Daily Luke K Kilroy, PA-C   81 mg at 07/16/14 1036  . carvedilol (COREG) tablet 12.5 mg  12.5 mg Oral BID Daniel Friedman, MD   12.5 mg at 07/15/14 2214  . furosemide (LASIX) tablet 80 mg  80 mg Oral Daily Kenneth C. Hilty, MD   80 mg at 07/16/14 1037  .  levalbuterol (XOPENEX) nebulizer solution 0.63 mg  0.63 mg Nebulization Q6H PRN Luke K Kilroy, PA-C   0.63 mg at 07/14/14 0742  . losartan (COZAAR) tablet 100 mg  100 mg Oral Daily Luke K Kilroy, PA-C   100 mg at 07/16/14 1036  . nitroGLYCERIN (NITROSTAT) SL tablet 0.4 mg  0.4 mg Sublingual Q5 Min x 3 PRN Luke K Kilroy, PA-C      . potassium chloride SA (K-DUR,KLOR-CON) CR tablet 20 mEq  20 mEq Oral Daily Luke K Kilroy, PA-C   20 mEq at 07/16/14 1036  . simvastatin (ZOCOR) tablet 40 mg  40 mg Oral QHS Luke K Kilroy, PA-C   40 mg at 07/15/14 2214  . sorbitol 70 % solution 30 mL  30 mL Oral Daily PRN Kenneth C. Hilty, MD   30 mL at 07/14/14 1238  . spironolactone (ALDACTONE) tablet 12.5 mg  12.5 mg Oral Daily Daniel R Bensimhon, MD   12.5 mg at 07/16/14 1037  . zolpidem (AMBIEN) tablet 5 mg  5 mg Oral QHS PRN Luke K Kilroy, PA-C        Physical Exam: General appearance: alert, no distress sitting in chair Neck: JVP flat Lungs: diminished breath sounds bilaterally and wheezes apex -   bilateral Heart: regular rate and rhythm and occasional ectopy Abdomen: obese,soft, non-tender, +BS Extremities: extremities normal, atraumatic, no cyanosis or edema Pulses: 2+ and symmetric Skin: Skin color, texture, turgor normal. No rashes or lesions Neurologic: Grossly normal .  Lab Results: Results for orders placed during the hospital encounter of 07/12/14 (from the past 48 hour(s))  BASIC METABOLIC PANEL     Status: Abnormal   Collection Time    07/15/14  3:56 AM      Result Value Ref Range   Sodium 140  137 - 147 mEq/L   Potassium 4.0  3.7 - 5.3 mEq/L   Chloride 100  96 - 112 mEq/L   CO2 29  19 - 32 mEq/L   Glucose, Bld 102 (*) 70 - 99 mg/dL   BUN 12  6 - 23 mg/dL   Creatinine, Ser 0.73  0.50 - 1.10 mg/dL   Calcium 8.9  8.4 - 10.5 mg/dL   GFR calc non Af Amer 84 (*) >90 mL/min   GFR calc Af Amer >90  >90 mL/min   Comment: (NOTE)     The eGFR has been calculated using the CKD EPI equation.      This calculation has not been validated in all clinical situations.     eGFR's persistently <90 mL/min signify possible Chronic Kidney     Disease.   Anion gap 11  5 - 15  BASIC METABOLIC PANEL     Status: Abnormal   Collection Time    07/16/14  5:00 AM      Result Value Ref Range   Sodium 138  137 - 147 mEq/L   Potassium 3.9  3.7 - 5.3 mEq/L   Chloride 97  96 - 112 mEq/L   CO2 29  19 - 32 mEq/L   Glucose, Bld 103 (*) 70 - 99 mg/dL   BUN 11  6 - 23 mg/dL   Creatinine, Ser 0.75  0.50 - 1.10 mg/dL   Calcium 9.3  8.4 - 10.5 mg/dL   GFR calc non Af Amer 83 (*) >90 mL/min   GFR calc Af Amer >90  >90 mL/min   Comment: (NOTE)     The eGFR has been calculated using the CKD EPI equation.     This calculation has not been validated in all clinical situations.     eGFR's persistently <90 mL/min signify possible Chronic Kidney     Disease.   Anion gap 12  5 - 15    Imaging: No results found.  Assessment:  1. Acute on chronic systolic HF EF 20% 2. Acute respiratory failure with hypoxia- Bi Pap per EMS 3. CAD- remote MI '90s, last cath April 2014- RCA disease-med Rx 4. NSVT 5. Morbid obesity 6. COPD with ongoing tobacco use 7. Elevated troponin  Plan:  Much improved. Volume status looks good. Continue po lasix. Can uptitrate as needed. Continue carvedilol (can switch to bisoprolol as needed) and losartan. Spiro added yesterday.  L/RHC tomorrow - we discussed procedure. Will need LifeVest on d/c. Can go to tele. Cath orders written.   Length of Stay:  LOS: 4 days   Daniel Bensimhon MD 07/16/2014, 12:33 PM     

## 2014-07-18 ENCOUNTER — Encounter (HOSPITAL_COMMUNITY): Payer: Self-pay | Admitting: Cardiology

## 2014-07-18 DIAGNOSIS — I200 Unstable angina: Secondary | ICD-10-CM

## 2014-07-18 DIAGNOSIS — Z955 Presence of coronary angioplasty implant and graft: Secondary | ICD-10-CM

## 2014-07-18 DIAGNOSIS — I5042 Chronic combined systolic (congestive) and diastolic (congestive) heart failure: Secondary | ICD-10-CM

## 2014-07-18 LAB — POCT I-STAT 3, VENOUS BLOOD GAS (G3P V)
Acid-Base Excess: 5 mmol/L — ABNORMAL HIGH (ref 0.0–2.0)
Bicarbonate: 32.2 mEq/L — ABNORMAL HIGH (ref 20.0–24.0)
O2 SAT: 56 %
PCO2 VEN: 55.8 mmHg — AB (ref 45.0–50.0)
PO2 VEN: 31 mmHg (ref 30.0–45.0)
TCO2: 34 mmol/L (ref 0–100)
pH, Ven: 7.369 — ABNORMAL HIGH (ref 7.250–7.300)

## 2014-07-18 LAB — BASIC METABOLIC PANEL
ANION GAP: 13 (ref 5–15)
BUN: 13 mg/dL (ref 6–23)
CHLORIDE: 100 meq/L (ref 96–112)
CO2: 28 meq/L (ref 19–32)
Calcium: 9.2 mg/dL (ref 8.4–10.5)
Creatinine, Ser: 0.9 mg/dL (ref 0.50–1.10)
GFR calc Af Amer: 73 mL/min — ABNORMAL LOW (ref >90)
GFR calc non Af Amer: 63 mL/min — ABNORMAL LOW (ref >90)
Glucose, Bld: 110 mg/dL — ABNORMAL HIGH (ref 70–99)
Potassium: 4.2 mEq/L (ref 3.7–5.3)
Sodium: 141 mEq/L (ref 137–147)

## 2014-07-18 LAB — CBC
HEMATOCRIT: 43.1 % (ref 36.0–46.0)
HEMOGLOBIN: 13.8 g/dL (ref 12.0–15.0)
MCH: 28.4 pg (ref 26.0–34.0)
MCHC: 32 g/dL (ref 30.0–36.0)
MCV: 88.7 fL (ref 78.0–100.0)
Platelets: 196 10*3/uL (ref 150–400)
RBC: 4.86 MIL/uL (ref 3.87–5.11)
RDW: 13.5 % (ref 11.5–15.5)
WBC: 6.3 10*3/uL (ref 4.0–10.5)

## 2014-07-18 LAB — POCT I-STAT 3, ART BLOOD GAS (G3+)
ACID-BASE EXCESS: 5 mmol/L — AB (ref 0.0–2.0)
BICARBONATE: 31.2 meq/L — AB (ref 20.0–24.0)
O2 SAT: 90 %
TCO2: 33 mmol/L (ref 0–100)
pCO2 arterial: 50.4 mmHg — ABNORMAL HIGH (ref 35.0–45.0)
pH, Arterial: 7.4 (ref 7.350–7.450)
pO2, Arterial: 59 mmHg — ABNORMAL LOW (ref 80.0–100.0)

## 2014-07-18 LAB — CK TOTAL AND CKMB (NOT AT ARMC)
CK, MB: 2.4 ng/mL (ref 0.3–4.0)
Relative Index: INVALID (ref 0.0–2.5)
Total CK: 87 U/L (ref 7–177)

## 2014-07-18 LAB — POCT ACTIVATED CLOTTING TIME: Activated Clotting Time: 383 seconds

## 2014-07-18 LAB — TROPONIN I: Troponin I: <0.3 ng/mL (ref <0.30)

## 2014-07-18 MED ORDER — SPIRONOLACTONE 12.5 MG HALF TABLET
12.5000 mg | ORAL_TABLET | Freq: Two times a day (BID) | ORAL | Status: DC
  Administered 2014-07-18 – 2014-07-19 (×2): 12.5 mg via ORAL
  Filled 2014-07-18 (×4): qty 1

## 2014-07-18 MED FILL — Sodium Chloride IV Soln 0.9%: INTRAVENOUS | Qty: 50 | Status: AC

## 2014-07-18 NOTE — Progress Notes (Signed)
Patient continues to be irregular on cardiac monitor also with a few burst  of NSVT. Patient stable and EKG done this morning to review. I will continue to  Monitor.

## 2014-07-18 NOTE — Progress Notes (Addendum)
73 y/o, obese, AA female, followed previously by Dr Daleen Squibb with a history of CAD and cardiomyopathy. She apparently had a remote STEMI in the '90s. She has had depressed EF by echo- 25% but not by cath April 2014- 45%. At cath she had diffuse RCA disease an non critical LAD disease to be treated medically.  She has been complaining of increasing dyspnea on exertion for the past few weeks.  Before she left for the office her dyspnea became so bad EMS was called. She was taken to [facility] and treated with IV Lasix and Bi pap. She reportedly had NSVT and was given an Amiodarone bolus and started on a drip. I have seen PVCs, couplets, and one 3 bt run on telemetry but there has been no VT and there are no VT strips available. The pt denies chest pain today or in the past few weeks. She denies increasing LE edema or orthopnea. She has not previously had a diagnosis of COPD and does not use inhalers at home. EF by Echo 20%  Subjective: No complaints this AM  Objective: Vital signs in last 24 hours: Temp:  [97.7 F (36.5 C)-98.7 F (37.1 C)] 98.2 F (36.8 C) (09/15 0522) Pulse Rate:  [29-80] 56 (09/15 0522) Resp:  [18-20] 20 (09/15 0522) BP: (102-165)/(40-117) 102/40 mmHg (09/15 0522) SpO2:  [90 %-96 %] 92 % (09/15 0522) Arterial Line BP: (137-165)/(59-73) 165/73 mmHg (09/14 1120) Weight:  [165 lb 9.1 oz (75.1 kg)] 165 lb 9.1 oz (75.1 kg) (09/15 0018) Weight change: 1 lb 5.2 oz (0.6 kg) Last BM Date: 07/16/14 Intake/Output from previous day: -1310 (-7997 since admit) wt 165.9 down from 169 on admit 09/14 0701 - 09/15 0700 In: 240 [P.O.:240] Out: 1550 [Urine:1550] Intake/Output this shift:    PE: General:Pleasant affect, NAD Skin:Warm and dry, brisk capillary refill HEENT:normocephalic, sclera clear, mucus membranes moist Neck:supple, no JVD Heart:S1S2 RRR without murmur, gallup, rub or click Lungs:clear without rales, rhonchi, or wheezes ONG:EXBM, non tender, + BS, do not palpate liver  spleen or masses Ext:no lower ext edema, 2+ pedal pulses, 2+ radial pulses, rt groin cath site without hematoma Neuro:alert and oriented, MAE, follows commands, + facial symmetry  tele: SR with freq PVCs single , couplets and 3 beats NSVT  EKG:  HR 51 Sinus bradycardia Possible Left atrial enlargement Left axis deviation Non-specific intra-ventricular conduction block Abnormal QRS-T angle, consider primary T wave abnormality Abnormal ECG  Lab Results:  Recent Labs  07/16/14 1849 07/18/14 0347  WBC 7.2 6.3  HGB 14.6 13.8  HCT 45.2 43.1  PLT 234 196   BMET  Recent Labs  07/17/14 1251 07/18/14 0347  NA 138 141  K 4.6 4.2  CL 98 100  CO2 28 28  GLUCOSE 96 110*  BUN 11 13  CREATININE 0.71 0.90  CALCIUM 9.2 9.2    Recent Labs  07/17/14 1820 07/17/14 2353  TROPONINI <0.30 <0.30    Lab Results  Component Value Date   CHOL 105 07/13/2014   HDL 29* 07/13/2014   LDLCALC 60 07/13/2014   TRIG 78 07/13/2014   CHOLHDL 3.6 07/13/2014   No results found for this basename: HGBA1C     Lab Results  Component Value Date   TSH 0.810 07/12/2014   BNP    Component Value Date/Time   PROBNP 2022.0* 07/14/2014 0218      Studies/Results: ECHO: - Left ventricle: The cavity size was severely dilated. Wall thickness was normal. The estimated  ejection fraction was 20%. Diffuse hypokinesis. There is akinesis of the inferolateral and inferior myocardium. Features are consistent with a pseudonormal left ventricular filling pattern, with concomitant abnormal relaxation and increased filling pressure (grade 2 diastolic dysfunction). Doppler parameters are consistent with high ventricular filling pressure. - Mitral valve: There was mild to moderate regurgitation. - Left atrium: The atrium was moderately dilated. Impressions: - Global hypokinesis; inferior and inferolateral akinesis; overall severely reduced LV function; grade 2 diastolic dysfunction with elevated left ventricular  filling pressures, moderate LAE; mild to moderate MR.  Cath: PROCEDURAL FINDINGS  Hemodynamics:  AO 122/48  LV 118/14  RA 9  RV 41/11  PA 37/5 with a mean of 21  PCWP 27  Oxygen saturations  AO 90  PA 56  Cardiac output 3.5  Cardiac index 2.0  Coronary angiography:  Coronary dominance: right  Left mainstem: The left mainstem is patent with 30% distal left main stenosis.  Left anterior descending (LAD): The LAD is diffusely diseased in the midportion. There are sequential 50% stenoses, unchanged from previous study. The first diagonal branch is large in distribution with diffuse nonobstructive disease noted. The mid and distal LAD are patent without significant disease.  Left circumflex (LCx): The left circumflex has 50% ostial stenosis, 50% mid vessel stenosis, and to OM branches with no significant disease. There is an intermediate branch is diffusely diseased with up to 70% stenosis. This vessel is small in caliber.  Right coronary artery (RCA): Dominant vessel. Tortuous with diffuse nonobstructive stenosis throughout the proximal and mid portions. There are diffuse 50% stenosis present. The distal vessel has an ulcerated-appearing irregular 90% stenosis involving the bifurcation of the PDA and posterolateral branches. The posterolateral branches are patent with mild diffuse disease. The PDA arises an acute angulation with 30-40% stenosis. The mid body of the PDA has an 80-90% stenosis in an area where the vessel is too small for PCI.  Left ventriculography: The ventricle appears the synchronous. The basal and midinferior wall are akinetic. The basal inferolateral wall is severely hypokinetic. The anterolateral wall and apex contract normally. The estimated LVEF is 30%.     PCI Procedure Note: Following the diagnostic procedure, the decision was made to proceed with PCI of the distal RCA. The lesion is complex, there is a large amount of myocardium involved. I felt there was a risk of  potentially compromising the PDA branch, but overall in my opinion the risk/benefit was favorable for PCI considering the severe stenosis involving the entire RCA distribution. The sheath was upsized to a 6 [place]. Weight-based bivalirudin was given for anticoagulation. The patient was loaded with Plavix 600 mg on the table. Once a therapeutic ACT was achieved, a 6 [place] JR 4 guide catheter was inserted. A cougar coronary guidewire was used to cross the lesion into the PLA branch. The lesion was predilated with a 2.5 x 15 mm balloon. The lesion was then stented with a 3.0 by 35mm Promus DES stent. The stent was postdilated with a 3.25 mm noncompliant balloon to 18 atmospheres. The PDA was stented across as the stent extended from the distal RCA proximal PLA branch. The PDA was totally occluded. I made a long attempt with a whisper wire using multiple bands on the tip of the wire, but all of these attempts were unsuccessful at rewiring the PDA. The patient was completely asymptomatic and specifically denied any chest pain. Her hemodynamics remained stable and there were no changes in her rhythm or blood pressure. A final image  of the left coronary artery was obtained and this demonstrated a collateral from the LAD to the PDA branch. Following PCI, there was 0% residual stenosis and TIMI-3 flow. Final angiography confirmed an excellent result. Femoral hemostasis was achieved will be achieved with manual hemostasis. The patient tolerated the PCI procedure well. There were no immediate procedural complications. The patient was transferred to the post catheterization recovery area for further monitoring.  PCI Data:  Vessel - RCA/Segment - distal  Percent Stenosis (pre) 90  TIMI-flow 3  Stent 3.0 by 16mm Promus DES  Percent Stenosis (post) 0  TIMI-flow (post) 3  Contrast: 175 cc Omnipaque  Radiation dose/Fluoro time: 15.5 minutes  Estimated Blood Loss: Minimal  Final Conclusions:  1. Nonobstructive disease  involving the LAD, left circumflex, and OM/diagonal side branches  2. Severe stenosis of the distal RCA bifurcation, treated successfully with a drug-eluting stent into the PLA branch with residual occlusion of the PDA branch  3. Severe segmental LV systolic dysfunction  Recommendations: Dual antiplatelet therapy with aspirin and Plavix for at least 12 months. Will cycle cardiac enzymes since the PDA branch is occluded after stenting.     Medications: I have reviewed the patient's current medications. Scheduled Meds: . aspirin EC  81 mg Oral Daily  . carvedilol  12.5 mg Oral BID  . clopidogrel  75 mg Oral Q breakfast  . furosemide  80 mg Oral Daily  . losartan  100 mg Oral Daily  . potassium chloride  20 mEq Oral Daily  . simvastatin  40 mg Oral QHS  . spironolactone  12.5 mg Oral Daily   Continuous Infusions: . sodium chloride Stopped (07/13/14 1059)   PRN Meds:.acetaminophen, ALPRAZolam, levalbuterol, nitroGLYCERIN, ondansetron (ZOFRAN) IV, oxyCODONE-acetaminophen, sorbitol, zolpidem   Assessment/Plan: 1. Acute on chronic systolic HF EF 20% by cath 30%  2. Acute respiratory failure with hypoxia- Bi Pap per EMS now resolved  3. CAD- remote MI '90s, last cath April 2014- RCA disease-med Rx  - 07/17/14 s/p stent to PLA of RCA, occluded PDA with negative troponins   4. NSVT continues with couplets and 3 beat runs will adjust meds order life vest  5. Morbid obesity   6. COPD with ongoing tobacco use   7. Elevated troponin X 1 at 0.35 on admit   8.  high risk sudden death plan life vest   Plan:  Much improved. Volume status looks good. Continue po lasix. Can uptitrate as needed. Continue carvedilol (can switch to bisoprolol as needed) and losartan. Cleda Daub added 07/16/14.   Per Dr. Graciela Husbands with EP consult " She will need a 30 day event recorder at discharge try to identify if there is an arrhythmia that is associated with the palpitations that she describes accompanying her  shortness of breath  her IVCD makes the potential benefit of CRT significantly attenuated. "  She will need life vest at discharge with all of her NSVT. And event monitor  counseled on stopping smoking.    LOS: 6 days   Time spent with pt. :15 minutes. [facility] R  Nurse Practitioner Certified Pager [phone] or after 5pm and on weekends call [phone] 07/18/2014, 7:38 AM   Patient seen and examined. Agree with assessment and plan.No further chest pain. Will arrange for life-vest placement prior to dc. Will dc KCL and titrate spironolactone to bid. 30 monitor post dc per Dr. Graciela Husbands. Keep today, f/u labs in am. Plan dc tomorrow.   Lennette Bihari, MD, [facility] 07/18/2014 9:46 AM

## 2014-07-18 NOTE — Progress Notes (Signed)
CARDIAC REHAB PHASE I   PRE:  Rate/Rhythm: 62 SR with PVCs    BP: sitting 120/40    SaO2:   MODE:  Ambulation: 550 ft   POST:  Rate/Rhythm: 102 with PVCs    BP: sitting 127/48     SaO2:   Tolerated well, no c/o. Numerous multifocal PVCs. Artifact at times. Ed completed. Pt did not want to discuss smoking cessation. Encouraged daily wts, ex. Interested in [facility] and will send referral to [place]. Set up Lifevest video. Pt did not want to watch any other videos.  [id number]  Elissa Lovett [place] CES, ACSM 07/18/2014 10:08 AM

## 2014-07-19 DIAGNOSIS — J810 Acute pulmonary edema: Secondary | ICD-10-CM

## 2014-07-19 DIAGNOSIS — Z9861 Coronary angioplasty status: Secondary | ICD-10-CM

## 2014-07-19 LAB — BASIC METABOLIC PANEL
Anion gap: 14 (ref 5–15)
BUN: 15 mg/dL (ref 6–23)
CHLORIDE: 99 meq/L (ref 96–112)
CO2: 28 mEq/L (ref 19–32)
CREATININE: 0.93 mg/dL (ref 0.50–1.10)
Calcium: 9.4 mg/dL (ref 8.4–10.5)
GFR calc non Af Amer: 60 mL/min — ABNORMAL LOW (ref >90)
GFR, EST AFRICAN AMERICAN: 70 mL/min — AB (ref >90)
GLUCOSE: 101 mg/dL — AB (ref 70–99)
POTASSIUM: 4.2 meq/L (ref 3.7–5.3)
Sodium: 141 mEq/L (ref 137–147)

## 2014-07-19 LAB — PRO B NATRIURETIC PEPTIDE: Pro B Natriuretic peptide (BNP): 708.7 pg/mL — ABNORMAL HIGH (ref 0–125)

## 2014-07-19 MED ORDER — SPIRONOLACTONE 12.5 MG HALF TABLET: 12.5000 mg | ORAL_TABLET | Freq: Two times a day (BID) | ORAL | Status: DC

## 2014-07-19 MED ORDER — ASPIRIN 81 MG PO TBEC: 81.0000 mg | DELAYED_RELEASE_TABLET | Freq: Every day | ORAL | Status: DC

## 2014-07-19 MED ORDER — CLOPIDOGREL BISULFATE 75 MG PO TABS: 75.0000 mg | ORAL_TABLET | Freq: Every day | ORAL | Status: DC

## 2014-07-19 MED ORDER — NITROGLYCERIN 0.4 MG SL SUBL: 0.4000 mg | SUBLINGUAL_TABLET | SUBLINGUAL | Status: DC | PRN

## 2014-07-19 MED ORDER — FUROSEMIDE 80 MG PO TABS: 80.0000 mg | ORAL_TABLET | Freq: Every day | ORAL | Status: DC

## 2014-07-19 MED ORDER — ACETAMINOPHEN 325 MG PO TABS: 650.0000 mg | ORAL_TABLET | ORAL | Status: DC | PRN

## 2014-07-19 NOTE — Discharge Instructions (Signed)
Heart Failure °Heart failure is a condition in which the heart has trouble pumping blood. This means your heart does not pump blood efficiently for your body to work well. In some cases of heart failure, fluid may back up into your lungs or you may have swelling (edema) in your lower legs. Heart failure is usually a long-term (chronic) condition. It is important for you to take good care of yourself and follow your health care provider's treatment plan. °CAUSES  °Some health conditions can cause heart failure. Those health conditions include: °· High blood pressure (hypertension). Hypertension causes the heart muscle to work harder than normal. When pressure in the blood vessels is high, the heart needs to pump (contract) with more force in order to circulate blood throughout the body. High blood pressure eventually causes the heart to become stiff and weak. °· Coronary artery disease (CAD). CAD is the buildup of cholesterol and fat (plaque) in the arteries of the heart. The blockage in the arteries deprives the heart muscle of oxygen and blood. This can cause chest pain and may lead to a heart attack. High blood pressure can also contribute to CAD. °· Heart attack (myocardial infarction). A heart attack occurs when one or more arteries in the heart become blocked. The loss of oxygen damages the muscle tissue of the heart. When this happens, part of the heart muscle dies. The injured tissue does not contract as well and weakens the heart's ability to pump blood. °· Abnormal heart valves. When the heart valves do not open and close properly, it can cause heart failure. This makes the heart muscle pump harder to keep the blood flowing. °· Heart muscle disease (cardiomyopathy or myocarditis). Heart muscle disease is damage to the heart muscle from a variety of causes. These can include drug or alcohol abuse, infections, or unknown reasons. These can increase the risk of heart failure. °· Lung disease. Lung disease  makes the heart work harder because the lungs do not work properly. This can cause a strain on the heart, leading it to fail. °· Diabetes. Diabetes increases the risk of heart failure. High blood sugar contributes to high fat (lipid) levels in the blood. Diabetes can also cause slow damage to tiny blood vessels that carry important nutrients to the heart muscle. When the heart does not get enough oxygen and food, it can cause the heart to become weak and stiff. This leads to a heart that does not contract efficiently. °· Other conditions can contribute to heart failure. These include abnormal heart rhythms, thyroid problems, and low blood counts (anemia). °Certain unhealthy behaviors can increase the risk of heart failure, including: °· Being overweight. °· Smoking or chewing tobacco. °· Eating foods high in fat and cholesterol. °· Abusing illicit drugs or alcohol. °· Lacking physical activity. °SYMPTOMS  °Heart failure symptoms may vary and can be hard to detect. Symptoms may include: °· Shortness of breath with activity, such as climbing stairs. °· Persistent cough. °· Swelling of the feet, ankles, legs, or abdomen. °· Unexplained weight gain. °· Difficulty breathing when lying flat (orthopnea). °· Waking from sleep because of the need to sit up and get more air. °· Rapid heartbeat. °· Fatigue and loss of energy. °· Feeling light-headed, dizzy, or close to fainting. °· Loss of appetite. °· Nausea. °· Increased urination during the night (nocturia). °DIAGNOSIS  °A diagnosis of heart failure is based on your history, symptoms, physical examination, and diagnostic tests. Diagnostic tests for heart failure may include: °·   Echocardiography. °· Electrocardiography. °· Chest X-ray. °· Blood tests. °· Exercise stress test. °· Cardiac angiography. °· Radionuclide scans. °TREATMENT  °Treatment is aimed at managing the symptoms of heart failure. Medicines, behavioral changes, or surgical intervention may be necessary to  treat heart failure. °· Medicines to help treat heart failure may include: °¨ Angiotensin-converting enzyme (ACE) inhibitors. This type of medicine blocks the effects of a blood protein called angiotensin-converting enzyme. ACE inhibitors relax (dilate) the blood vessels and help lower blood pressure. °¨ Angiotensin receptor blockers (ARBs). This type of medicine blocks the actions of a blood protein called angiotensin. Angiotensin receptor blockers dilate the blood vessels and help lower blood pressure. °¨ Water pills (diuretics). Diuretics cause the kidneys to remove salt and water from the blood. The extra fluid is removed through urination. This loss of extra fluid lowers the volume of blood the heart pumps. °¨ Beta blockers. These prevent the heart from beating too fast and improve heart muscle strength. °¨ Digitalis. This increases the force of the heartbeat. °· Healthy behavior changes include: °¨ Obtaining and maintaining a healthy weight. °¨ Stopping smoking or chewing tobacco. °¨ Eating heart-healthy foods. °¨ Limiting or avoiding alcohol. °¨ Stopping illicit drug use. °¨ Physical activity as directed by your health care provider. °· Surgical treatment for heart failure may include: °¨ A procedure to open blocked arteries, repair damaged heart valves, or remove damaged heart muscle tissue. °¨ A pacemaker to improve heart muscle function and control certain abnormal heart rhythms. °¨ An internal cardioverter defibrillator to treat certain serious abnormal heart rhythms. °¨ A left ventricular assist device (LVAD) to assist the pumping ability of the heart. °HOME CARE INSTRUCTIONS  °· Take medicines only as directed by your health care provider. Medicines are important in reducing the workload of your heart, slowing the progression of heart failure, and improving your symptoms. °¨ Do not stop taking your medicine unless directed by your health care provider. °¨ Do not skip any dose of medicine. °¨ Refill your  prescriptions before you run out of medicine. Your medicines are needed every day. °· Engage in moderate physical activity if directed by your health care provider. Moderate physical activity can benefit some people. The elderly and people with severe heart failure should consult with a health care provider for physical activity recommendations. °· Eat heart-healthy foods. Food choices should be free of trans fat and low in saturated fat, cholesterol, and salt (sodium). Healthy choices include fresh or frozen fruits and vegetables, fish, lean meats, legumes, fat-free or low-fat dairy products, and whole grain or high fiber foods. Talk to a dietitian to learn more about heart-healthy foods. °· Limit sodium if directed by your health care provider. Sodium restriction may reduce symptoms of heart failure in some people. Talk to a dietitian to learn more about heart-healthy seasonings. °· Use healthy cooking methods. Healthy cooking methods include roasting, grilling, broiling, baking, poaching, steaming, or stir-frying. Talk to a dietitian to learn more about healthy cooking methods. °· Limit fluids if directed by your health care provider. Fluid restriction may reduce symptoms of heart failure in some people. °· Weigh yourself every day. Daily weights are important in the early recognition of excess fluid. You should weigh yourself every morning after you urinate and before you eat breakfast. Wear the same amount of clothing each time you weigh yourself. Record your daily weight. Provide your health care provider with your weight record. °· Monitor and record your blood pressure if directed by your health care   provider.  Check your pulse if directed by your health care provider.  Lose weight if directed by your health care provider. Weight loss may reduce symptoms of heart failure in some people.  Stop smoking or chewing tobacco. Nicotine makes your heart work harder by causing your blood vessels to constrict.  Do not use nicotine gum or patches before talking to your health care provider.  Keep all follow-up visits as directed by your health care provider. This is important.  Limit alcohol intake to no more than 1 drink per day for nonpregnant women and 2 drinks per day for men. One drink equals 12 ounces of beer, 5 ounces of wine, or 1 ounces of hard liquor. Drinking more than that is harmful to your heart. Tell your health care provider if you drink alcohol several times a week. Talk with your health care provider about whether alcohol is safe for you. If your heart has already been damaged by alcohol or you have severe heart failure, drinking alcohol should be stopped completely.  Stop illicit drug use.  Stay up-to-date with immunizations. It is especially important to prevent respiratory infections through current pneumococcal and influenza immunizations.  Manage other health conditions such as hypertension, diabetes, thyroid disease, or abnormal heart rhythms as directed by your health care provider.  Learn to manage stress.  Plan rest periods when fatigued.  Learn strategies to manage high temperatures. If the weather is extremely hot:  Avoid vigorous physical activity.  Use air conditioning or fans or seek a cooler location.  Avoid caffeine and alcohol.  Wear loose-fitting, lightweight, and light-colored clothing.  Learn strategies to manage cold temperatures. If the weather is extremely cold:  Avoid vigorous physical activity.  Layer clothes.  Wear mittens or gloves, a hat, and a scarf when going outside.  Avoid alcohol.  Obtain ongoing education and support as needed.  Participate in or seek rehabilitation as needed to maintain or improve independence and quality of life. SEEK MEDICAL CARE IF:   Your weight increases by 03 lb/1.4 kg in 1 day or 05 lb/2.3 kg in a week.  You have increasing shortness of breath that is unusual for you.  You are unable to participate in  your usual physical activities.  You tire easily.  You cough more than normal, especially with physical activity.  You have any or more swelling in areas such as your hands, feet, ankles, or abdomen.  You are unable to sleep because it is hard to breathe.  You feel like your heart is beating fast (palpitations).  You become dizzy or light-headed upon standing up. SEEK IMMEDIATE MEDICAL CARE IF:   You have difficulty breathing.  There is a change in mental status such as decreased alertness or difficulty with concentration.  You have a pain or discomfort in your chest.  You have an episode of fainting (syncope). MAKE SURE YOU:   Understand these instructions.  Will watch your condition.  Will get help right away if you are not doing well or get worse. Document Released: 10/20/2005 Document Revised: 03/06/2014 Document Reviewed: 11/19/2012 [facility] Patient Information 2015 [place], [place]. This information is not intended to replace advice given to you by your health care provider. Make sure you discuss any questions you have with your health care provider. Coronary Angiogram with Stent Coronary angiography with stent placement is a procedure to widen or open a narrow blood vessel of the heart (coronary artery). When a coronary artery becomes partially blocked, it decreases blood flow to  that area. This may lead to chest pain or a heart attack (myocardial infarction). Arteries may become blocked by cholesterol buildup (plaque) in the lining or wall.  A stent is a small piece of metal that looks like a mesh or a spring. Stent placement may be done right after a coronary angiography in which a blocked artery is found or as a treatment for a heart attack.  LET [facility] CARE PROVIDER KNOW ABOUT:  Any allergies you have.   All medicines you are taking, including vitamins, herbs, eye drops, creams, and over-the-counter medicines.   Previous problems you or members of your family  have had with the use of anesthetics.   Any blood disorders you have.   Previous surgeries you have had.   Medical conditions you have. RISKS AND COMPLICATIONS Generally, coronary angiography with stent is a safe procedure. However, problems can occur and include:  Damage to the heart or its blood vessels.   A return of blockage.   Bleeding, infection, or bruising at the insertion site.   A collection of blood under the skin (hematoma) at the insertion site.  Blood clot in another part of the body.   Kidney injury.   Allergic reaction to the dye or contrast used.   Bleeding into the abdomen (retroperitoneal bleeding). BEFORE THE PROCEDURE  Do not eat or drink anything after midnight on the night before the procedure or as directed by your health care provider.  Ask your health care provider about changing or stopping your regular medicines. This is especially important if you are taking diabetes medicines or blood thinners.  Your health care provider will make sure you understand the procedure as well as the risks and potential problems associated with the procedure.  PROCEDURE  You may be given a medicine to help you relax before and during the procedure (sedative). This medicine will be given through an IV tube that is put into one of your veins.   The area where the catheter will be inserted will be shaved and cleaned. This is usually done in the groin but may be done in the fold of your arm (near your elbow) or in the wrist.   A medicine will be given to numb the area where the catheter will be inserted (local anesthetic).   The catheter will be inserted into an artery using a guide wire. A type of X-ray (fluoroscopy) will be used to help guide the catheter to the opening of the blocked artery.   A dye will then be injected into the catheter, and X-rays will be taken. The dye will help to show where any narrowing or blockages are located in the heart  arteries.   A tiny wire will be guided to the blocked spot, and a balloon will be inflated to make the artery wider. The stent will be expanded and will crush the plaque into the wall of the vessel. The stent will hold the area open like a scaffolding and improve the blood flow.   Sometimes the artery may be made wider using a laser or other tools to remove plaque.   When the blood flow is better, the catheter will be removed. The lining of the artery will grow over the stent, which stays where it was placed.  AFTER THE PROCEDURE  If the procedure is done through the leg, you will be kept in bed lying flat for about 6 hours. You will be instructed to not bend or cross your legs.  The insertion site will be checked frequently.   The pulse in your feet or wrist will be checked frequently.   Additional blood tests, X-rays, and electrocardiography may be done. Document Released: 04/26/2003 Document Revised: 03/06/2014 Document Reviewed: 04/28/2013 [facility] Patient Information 2015 [place], [place]. This information is not intended to replace advice given to you by your health care provider. Make sure you discuss any questions you have with your health care provider.

## 2014-07-19 NOTE — Discharge Summary (Signed)
Patient ID: Nicole Clements,  MRN: [id number], DOB/AGE: July 26, 1941 73 y.o.  Admit date: 07/12/2014 Discharge date: 07/19/2014  Primary Care Provider: Lupita Raider, MD Primary Cardiologist: Dr Wyline Mood  Discharge Diagnoses Principal Problem:   Acute respiratory failure with hypoxia- Bi Pap per EMS Active Problems:   Acute systolic congestive heart failure   S/P coronary artery stent placement DES-PLA of RCA and occlusion of PDA 07/17/14   CAD- remote MI '90s, distal RCA DES 07/17/14   ICM- new LVD 20-25% 07/13/14   DYSLIPIDEMIA   TOBACCO USER   NSVT - Life Vest    Procedures: Cath/ PCI DES RCA 07/17/14   Hospital Course: 73 y/o, obese, AA female, followed previously by Dr Daleen Squibb with a history of CAD and cardiomyopathy. She apparently had a remote STEMI in the '90s. She has had depressed EF by echo- 25% but by cath in April 2014 it was 45%. At cath then she had diffuse RCA disease an non critical LAD disease to be treated medically. She has not seen a cardiologist since June 2014. She admits she ran out of her Lasix recently. She has been complaining of increasing dyspnea on exertion for the past few weeks. She actually had an appointment to see Dr Wyline Mood on the day she was admitted-07/12/14. Before she left for the office her dyspnea became so bad EMS was called. She was taken to [facility] and treated with IV Lasix and Bi pap. She reportedly had NSVT and was given an Amiodarone bolus and started on a drip. She was admitted and diuresed 8.7L. Echo showed an EF of 20% with grade 2 diastolic dysfunction. She had a cath on 07/07/14 and had an RCA DES placed. She was seen by Dr Graciela Husbands this admission and it was decided to send her home with a Life Vest. She will be seen in a week in the [place] office.    Discharge Vitals:  Blood pressure 94/35, pulse 49, temperature 98.3 F (36.8 C), temperature source Oral, resp. rate 20, height 5\' 3"  (1.6 m), weight 166 lb 14.2 oz (75.7 kg), SpO2 94.00%.     Labs: Results for orders placed during the hospital encounter of 07/12/14 (from the past 24 hour(s))  BASIC METABOLIC PANEL     Status: Abnormal   Collection Time    07/19/14  3:25 AM      Result Value Ref Range   Sodium 141  137 - 147 mEq/L   Potassium 4.2  3.7 - 5.3 mEq/L   Chloride 99  96 - 112 mEq/L   CO2 28  19 - 32 mEq/L   Glucose, Bld 101 (*) 70 - 99 mg/dL   BUN 15  6 - 23 mg/dL   Creatinine, Ser 9.03  0.50 - 1.10 mg/dL   Calcium 9.4  8.4 - 00.9 mg/dL   GFR calc non Af Amer 60 (*) >90 mL/min   GFR calc Af Amer 70 (*) >90 mL/min   Anion gap 14  5 - 15  PRO B NATRIURETIC PEPTIDE     Status: Abnormal   Collection Time    07/19/14  3:25 AM      Result Value Ref Range   Pro B Natriuretic peptide (BNP) 708.7 (*) 0 - 125 pg/mL    Disposition:  Follow-up Information   Follow up with Antoine Poche, MD On 07/25/2014. (2:10 pm Bailey Mech PA will see you))    Specialty:  Cardiology   Contact information:   [address]  [place] [address] [phone]       Discharge Medications:    Medication List    STOP taking these medications       aspirin 325 MG tablet  Replaced by:  aspirin 81 MG EC tablet     potassium chloride SA 20 MEQ tablet  Commonly known as:  K-DUR,KLOR-CON      TAKE these medications       acetaminophen 325 MG tablet  Commonly known as:  TYLENOL  Take 2 tablets (650 mg total) by mouth every 4 (four) hours as needed for headache or mild pain.     aspirin 81 MG EC tablet  Take 1 tablet (81 mg total) by mouth daily.     carvedilol 12.5 MG tablet  Commonly known as:  COREG  Take 1 tablet (12.5 mg total) by mouth 2 (two) times daily.     clopidogrel 75 MG tablet  Commonly known as:  PLAVIX  Take 1 tablet (75 mg total) by mouth daily with breakfast.     Fish Oil 1000 MG Caps  Take 1,000 mg by mouth daily.     furosemide 80 MG tablet  Commonly known as:  LASIX  Take 1 tablet (80 mg total) by mouth daily.     losartan  100 MG tablet  Commonly known as:  COZAAR  Take 1 tablet (100 mg total) by mouth daily.     nitroGLYCERIN 0.4 MG SL tablet  Commonly known as:  NITROSTAT  Place 1 tablet (0.4 mg total) under the tongue every 5 (five) minutes x 3 doses as needed for chest pain.     simvastatin 40 MG tablet  Commonly known as:  ZOCOR  Take 1 tablet (40 mg total) by mouth at bedtime.     spironolactone 12.5 mg Tabs tablet  Commonly known as:  ALDACTONE  Take 0.5 tablets (12.5 mg total) by mouth 2 (two) times daily.     Vitamin D3 2000 UNITS capsule  Take 2,000 Units by mouth daily.         Duration of Discharge Encounter: Greater than 30 minutes including physician time.  Jolene Provost PA-C 07/19/2014 9:37 AM

## 2014-07-19 NOTE — Progress Notes (Signed)
Patient Name: Nicole Clements Date of Encounter: 07/19/2014  Principal Problem:   Acute respiratory failure with hypoxia- Bi Pap per EMS Active Problems:   DYSLIPIDEMIA   TOBACCO USER   CAD- remote MI '90s, last cath April 2014- RCA disease-med Rx   Chronic combined systolic and diastolic congestive heart failure   Acute pulmonary edema   ICM-last EF 45% by cath, 25% by echo April 2014   Asymptomatic PVCs   Acute respiratory failure   Acute on chronic systolic heart failure   S/P coronary artery stent placement DES-PLA of RCA and occlusion of PDA 07/17/14   Length of Stay: 7  SUBJECTIVE  Feels well. No angina or dyspnea No further VT, but frequent PVCs and couplets on monitor.  CURRENT MEDS . aspirin EC  81 mg Oral Daily  . carvedilol  12.5 mg Oral BID  . clopidogrel  75 mg Oral Q breakfast  . furosemide  80 mg Oral Daily  . losartan  100 mg Oral Daily  . simvastatin  40 mg Oral QHS  . spironolactone  12.5 mg Oral BID    OBJECTIVE   Intake/Output Summary (Last 24 hours) at 07/19/14 0857 Last data filed at 07/19/14 0700  Gross per 24 hour  Intake    720 ml  Output   1400 ml  Net   -680 ml   Filed Weights   07/16/14 1611 07/18/14 0018 07/19/14 0029  Weight: 164 lb 3.9 oz (74.5 kg) 165 lb 9.1 oz (75.1 kg) 166 lb 14.2 oz (75.7 kg)    PHYSICAL EXAM Filed Vitals:   07/18/14 2015 07/18/14 2352 07/19/14 0029 07/19/14 0612  BP: 130/37 122/40  94/35  Pulse: 54 42  49  Temp: 97.2 F (36.2 C) 97.9 F (36.6 C)  98.3 F (36.8 C)  TempSrc: Oral Oral  Oral  Resp: Height:      Weight:   166 lb 14.2 oz (75.7 kg)   SpO2: 96% 94%  92%   General: Alert, oriented x3, no distress Head: no evidence of trauma, PERRL, EOMI, no exophtalmos or lid lag, no myxedema, no xanthelasma; normal ears, nose and oropharynx Neck: normal jugular venous pulsations and no hepatojugular reflux; brisk carotid pulses without delay and no carotid bruits Chest: clear to auscultation, no  signs of consolidation by percussion or palpation, normal fremitus, symmetrical and full respiratory excursions Cardiovascular: normal position and quality of the apical impulse, regular rhythm, normal first and second heart sounds, no rubs or gallops, no murmur Abdomen: no tenderness or distention, no masses by palpation, no abnormal pulsatility or arterial bruits, normal bowel sounds, no hepatosplenomegaly Extremities: no clubbing, cyanosis or edema; 2+ radial, ulnar and brachial pulses bilaterally; 2+ right femoral, posterior tibial and dorsalis pedis pulses; 2+ left femoral, posterior tibial and dorsalis pedis pulses; no subclavian or femoral bruits Neurological: grossly nonfocal  LABS  CBC  Recent Labs  07/16/14 1849 07/18/14 0347  WBC 7.2 6.3  HGB 14.6 13.8  HCT 45.2 43.1  MCV 89.0 88.7  PLT 234 196   Basic Metabolic Panel  Recent Labs  07/18/14 0347 07/19/14 0325  NA 141 141  K 4.2 4.2  CL 100 99  CO2 28 28  GLUCOSE 110* 101*  BUN 13 15  CREATININE 0.90 0.93  CALCIUM 9.2 9.4   Liver Function Tests No results found for this basename: AST, ALT, ALKPHOS, BILITOT, PROT, ALBUMIN,  in the last 72 hours No results found for this basename: LIPASE,  AMYLASE,  in the last 72 hours Cardiac Enzymes  Recent Labs  07/17/14 1215 07/17/14 1820 07/17/14 2353  CKTOTAL 85 81 87  CKMB 1.7 2.5 2.4  TROPONINI <0.30 <0.30 <0.30   Radiology Studies Imaging results have been reviewed and No results found.  TELE Sinus bradycardia, PVCs  ECG SBrady, nonspecific IVCD, QRS 142 ms  ASSESSMENT AND PLAN 1. DC home 2. Discussed sodium restriction and daily weight monitoring, signs and symptoms of CHF exacerbation in detail 3. Early f/u - Dr.Branch in [place] [facility] 4. Life Vest 5. Reassess LVEF in 90 days to decide whether she needs implantable ICD. She also has borderline indication for CRT (LVEF is low, ischemic cardiomyopathy, female gender, not typical LBBB, QRS just under  150 ms). If EF still <35%, I would favor CRT-D 6. ARB , carvedilol - BP leaves little room for additional titration 7. Discussed mandatory dual antiplatelet therapy. 8. Excellent LDL-C on statin 9. Critical that she quits smoking completely   Thurmon Fair, MD, [facility] [phone] office [phone] pager 07/19/2014 8:57 AM

## 2014-07-20 DIAGNOSIS — I5023 Acute on chronic systolic (congestive) heart failure: Secondary | ICD-10-CM | POA: Diagnosis not present

## 2014-07-20 DIAGNOSIS — I255 Ischemic cardiomyopathy: Secondary | ICD-10-CM | POA: Diagnosis not present

## 2014-07-20 DIAGNOSIS — Z95810 Presence of automatic (implantable) cardiac defibrillator: Secondary | ICD-10-CM | POA: Diagnosis not present

## 2014-07-20 DIAGNOSIS — I2510 Atherosclerotic heart disease of native coronary artery without angina pectoris: Secondary | ICD-10-CM | POA: Diagnosis not present

## 2014-07-20 DIAGNOSIS — Z720 Tobacco use: Secondary | ICD-10-CM | POA: Diagnosis not present

## 2014-07-20 DIAGNOSIS — I10 Essential (primary) hypertension: Secondary | ICD-10-CM | POA: Diagnosis not present

## 2014-07-22 DIAGNOSIS — Z720 Tobacco use: Secondary | ICD-10-CM | POA: Diagnosis not present

## 2014-07-22 DIAGNOSIS — I2510 Atherosclerotic heart disease of native coronary artery without angina pectoris: Secondary | ICD-10-CM | POA: Diagnosis not present

## 2014-07-22 DIAGNOSIS — I255 Ischemic cardiomyopathy: Secondary | ICD-10-CM | POA: Diagnosis not present

## 2014-07-22 DIAGNOSIS — Z95810 Presence of automatic (implantable) cardiac defibrillator: Secondary | ICD-10-CM | POA: Diagnosis not present

## 2014-07-22 DIAGNOSIS — I10 Essential (primary) hypertension: Secondary | ICD-10-CM | POA: Diagnosis not present

## 2014-07-22 DIAGNOSIS — I5023 Acute on chronic systolic (congestive) heart failure: Secondary | ICD-10-CM | POA: Diagnosis not present

## 2014-07-24 DIAGNOSIS — Z95810 Presence of automatic (implantable) cardiac defibrillator: Secondary | ICD-10-CM | POA: Diagnosis not present

## 2014-07-24 DIAGNOSIS — I255 Ischemic cardiomyopathy: Secondary | ICD-10-CM | POA: Diagnosis not present

## 2014-07-24 DIAGNOSIS — I2510 Atherosclerotic heart disease of native coronary artery without angina pectoris: Secondary | ICD-10-CM | POA: Diagnosis not present

## 2014-07-24 DIAGNOSIS — Z720 Tobacco use: Secondary | ICD-10-CM | POA: Diagnosis not present

## 2014-07-24 DIAGNOSIS — I10 Essential (primary) hypertension: Secondary | ICD-10-CM | POA: Diagnosis not present

## 2014-07-24 DIAGNOSIS — I5023 Acute on chronic systolic (congestive) heart failure: Secondary | ICD-10-CM | POA: Diagnosis not present

## 2014-07-25 ENCOUNTER — Ambulatory Visit (INDEPENDENT_AMBULATORY_CARE_PROVIDER_SITE_OTHER): Payer: Medicare Other | Admitting: Adult Health

## 2014-07-25 ENCOUNTER — Encounter: Payer: Self-pay | Admitting: Adult Health

## 2014-07-25 VITALS — BP 148/78 | HR 60 | Ht 63.0 in | Wt 166.0 lb

## 2014-07-25 DIAGNOSIS — I2589 Other forms of chronic ischemic heart disease: Secondary | ICD-10-CM

## 2014-07-25 DIAGNOSIS — I5022 Chronic systolic (congestive) heart failure: Secondary | ICD-10-CM

## 2014-07-25 DIAGNOSIS — I255 Ischemic cardiomyopathy: Secondary | ICD-10-CM

## 2014-07-25 DIAGNOSIS — F17200 Nicotine dependence, unspecified, uncomplicated: Secondary | ICD-10-CM

## 2014-07-25 DIAGNOSIS — I509 Heart failure, unspecified: Secondary | ICD-10-CM | POA: Diagnosis not present

## 2014-07-25 MED ORDER — CARVEDILOL 25 MG PO TABS: 25.0000 mg | ORAL_TABLET | Freq: Two times a day (BID) | ORAL | Status: DC

## 2014-07-25 NOTE — Assessment & Plan Note (Signed)
We will optimize medical therapy.

## 2014-07-25 NOTE — Assessment & Plan Note (Signed)
I have talked with her about smoking cessation and progression of CAD and increased cardiac irritability with nicotine. She verbalizes understanding. I have stressed the importance of stopping as soon as possible.

## 2014-07-25 NOTE — Progress Notes (Deleted)
Name: Nicole Clements    DOB: Sep 06, 1941  Age: 73 y.o.  MR#: [id number]       PCP:  Lupita Raider, MD      Insurance: Payor: MEDICARE / Plan: MEDICARE PART A AND B / Product Type: *No Product type* /   CC:    Chief Complaint  Patient presents with  . Coronary Artery Disease  . Hypertension    VS Filed Vitals:   07/25/14 1357  BP: 148/78  Pulse: 60  Height:  (1.6 m)  Weight: 166 lb (75.297 kg)    Weights Current Weight  07/25/14 166 lb (75.297 kg)  07/19/14 166 lb 14.2 oz (75.7 kg)  07/19/14 166 lb 14.2 oz (75.7 kg)    Blood Pressure  BP Readings from Last 3 Encounters:  07/25/14 148/78  07/19/14 94/35  07/19/14 94/35     Admit date:  (Not on file) Last encounter with RMR:  Visit date not found   Allergy Sulfa antibiotics  Current Outpatient Prescriptions  Medication Sig Dispense Refill  . acetaminophen (TYLENOL) 325 MG tablet Take 2 tablets (650 mg total) by mouth every 4 (four) hours as needed for headache or mild pain.      [name] aspirin EC 81 MG EC tablet Take 1 tablet (81 mg total) by mouth daily.      . carvedilol (COREG) 12.5 MG tablet Take 1 tablet (12.5 mg total) by mouth 2 (two) times daily.  60 tablet  0  . Cholecalciferol (VITAMIN D3) 2000 UNITS capsule Take 2,000 Units by mouth daily.        . clopidogrel (PLAVIX) 75 MG tablet Take 1 tablet (75 mg total) by mouth daily with breakfast.  30 tablet  11  . furosemide (LASIX) 80 MG tablet Take 1 tablet (80 mg total) by mouth daily.  30 tablet  11  . losartan (COZAAR) 100 MG tablet Take 1 tablet (100 mg total) by mouth daily.  30 tablet  8  . nitroGLYCERIN (NITROSTAT) 0.4 MG SL tablet Place 1 tablet (0.4 mg total) under the tongue every 5 (five) minutes x 3 doses as needed for chest pain.  25 tablet  2  . Omega-3 Fatty Acids (FISH OIL) 1000 MG CAPS Take 1,000 mg by mouth daily.       . simvastatin (ZOCOR) 40 MG tablet Take 1 tablet (40 mg total) by mouth at bedtime.  90 tablet  3  . spironolactone (ALDACTONE)  12.5 mg TABS tablet Take 0.5 tablets (12.5 mg total) by mouth 2 (two) times daily.  30 tablet  11   No current facility-administered medications for this visit.    Discontinued Meds:   There are no discontinued medications.  Patient Active Problem List   Diagnosis Date Noted  . S/P coronary artery stent placement DES-PLA of RCA and occlusion of PDA 07/17/14 07/18/2014  . Acute on chronic systolic heart failure 07/16/2014  . Acute respiratory failure 07/12/2014  . 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  . ICM- new LVD 20-25% 07/13/14 02/21/2013  . Acute respiratory failure with hypoxia- Bi Pap per EMS 12/31/2012  . Acute systolic congestive heart failure 12/31/2012  . Hx of colonoscopy with polypectomy 07/17/2011  . DYSLIPIDEMIA 01/11/2010  . TOBACCO USER 01/11/2010  . 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  . CAD- remote MI '90s, distal RCA DES 07/17/14 01/11/2010    LABS    Component Value Date/Time   NA 141 07/19/2014 0325   NA 141 07/18/2014 0347   NA 138 07/17/2014 1251  K 4.2 07/19/2014 0325   K 4.2 07/18/2014 0347   K 4.6 07/17/2014 1251   CL 99 07/19/2014 0325   CL 100 07/18/2014 0347   CL 98 07/17/2014 1251   CO2 28 07/19/2014 0325   CO2 28 07/18/2014 0347   CO2 28 07/17/2014 1251   GLUCOSE 101* 07/19/2014 0325   GLUCOSE 110* 07/18/2014 0347   GLUCOSE 96 07/17/2014 1251   BUN 15 07/19/2014 0325   BUN 13 07/18/2014 0347   BUN 11 07/17/2014 1251   CREATININE 0.93 07/19/2014 0325   CREATININE 0.90 07/18/2014 0347   CREATININE 0.71 07/17/2014 1251   CREATININE 1.24* 01/26/2013 1430   CREATININE 0.94 12/03/2012 0915   CALCIUM 9.4 07/19/2014 0325   CALCIUM 9.2 07/18/2014 0347   CALCIUM 9.2 07/17/2014 1251   GFRNONAA 60* 07/19/2014 0325   GFRNONAA 63* 07/18/2014 0347   GFRNONAA 85* 07/17/2014 1251   GFRAA 70* 07/19/2014 0325   GFRAA 73* 07/18/2014 0347   GFRAA >90 07/17/2014 1251   CMP     Component Value Date/Time   NA 141 07/19/2014 0325   K 4.2 07/19/2014 0325   CL 99 07/19/2014 0325   CO2 28  07/19/2014 0325   GLUCOSE 101* 07/19/2014 0325   BUN 15 07/19/2014 0325   CREATININE 0.93 07/19/2014 0325   CREATININE 1.24* 01/26/2013 1430   CALCIUM 9.4 07/19/2014 0325   PROT 7.6 12/31/2012 1542   ALBUMIN 3.6 12/31/2012 1542   AST 24 12/31/2012 1542   ALT 16 12/31/2012 1542   ALKPHOS 101 12/31/2012 1542   BILITOT 1.3* 12/31/2012 1542   GFRNONAA 60* 07/19/2014 0325   GFRAA 70* 07/19/2014 0325       Component Value Date/Time   WBC 6.3 07/18/2014 0347   WBC 7.2 07/16/2014 1849   WBC 9.0 07/12/2014 2130   HGB 13.8 07/18/2014 0347   HGB 14.6 07/16/2014 1849   HGB 13.6 07/12/2014 2130   HCT 43.1 07/18/2014 0347   HCT 45.2 07/16/2014 1849   HCT 42.3 07/12/2014 2130   MCV 88.7 07/18/2014 0347   MCV 89.0 07/16/2014 1849   MCV 89.8 07/12/2014 2130    Lipid Panel     Component Value Date/Time   CHOL 105 07/13/2014 0315   TRIG 78 07/13/2014 0315   HDL 29* 07/13/2014 0315   CHOLHDL 3.6 07/13/2014 0315   VLDL 16 07/13/2014 0315   LDLCALC 60 07/13/2014 0315    ABG    Component Value Date/Time   PHART 7.400 07/17/2014 0802   PCO2ART 50.4* 07/17/2014 0802   PO2ART 59.0* 07/17/2014 0802   HCO3 31.2* 07/17/2014 0802   TCO2 33 07/17/2014 0802   ACIDBASEDEF 7.1* 07/12/2014 1545   O2SAT 90.0 07/17/2014 0802     Lab Results  Component Value Date   TSH 0.810 07/12/2014   BNP (last 3 results)  Recent Labs  07/12/14 1555 07/14/14 0218 07/19/14 0325  PROBNP 8927.0* 2022.0* 708.7*   Cardiac Panel (last 3 results) No results found for this basename: CKTOTAL, CKMB, TROPONINI, RELINDX,  in the last 72 hours  Iron/TIBC/Ferritin/ %Sat No results found for this basename: iron, tibc, ferritin, ironpctsat     EKG Orders placed during the hospital encounter of 07/12/14  . ED EKG  . ED EKG  . EKG 12-LEAD  . EKG 12-LEAD  . EKG 12-LEAD  . EKG 12-LEAD  . EKG 12-LEAD  . EKG 12-LEAD  . EKG 12-LEAD  . EKG 12-LEAD  . EKG  . EKG 12-LEAD  .  EKG 12-LEAD  . EKG 12-LEAD  . EKG 12-LEAD  . EKG 12-LEAD  . EKG 12-LEAD  .  EKG 12-LEAD  . EKG     Prior Assessment and Plan Problem List as of 07/25/2014     Cardiovascular and Mediastinum   CAD- remote MI '90s, distal RCA DES 07/17/14   Last Assessment & Plan   04/05/2013 Office Visit Written 04/05/2013  2:25 PM by Gaylord Shih, MD     Stable. Continue medical therapy for distal right coronary vessel disease which is not amenable to PCI. Followup in one year.    OLD MYOCARDIAL INFARCTION   Acute systolic congestive heart failure   ICM- new LVD 20-25% 07/13/14   Last Assessment & Plan   02/21/2013 Office Visit Written 02/21/2013  3:19 PM by Duke Salvia, MD     The patient has ischemic heart disease with apparently a prior MI in the late 1990 not identified on Myoview in 2011  With regional wall motion abnormalities.  Recent episode of congestive heart failure prompts the question is the progression of coronary disease and catheterization is indicated prior to consideration of ICD implantation.  There has also been, in the past, discordance in left ventricular function assessments with near simultaneous measurement ranging from 25-50%.  Left ventricular function assessment at the time of catheterization will be important. In the event that left ventricular dysfunction is present, I would recommend reduction of hydralazine/nitrates with reassessment of left ventricular function prior to making a decision of ICD implantation.  Her IVCD precludes implantation of CRT; S. ICD would be a reasonable consideration at that time.    NSVT - Life Vest   Last Assessment & Plan   04/05/2013 Office Visit Written 04/05/2013  2:46 PM by Gaylord Shih, MD     EF is 45%. Continue carvedilol.    Acute on chronic systolic heart failure     Respiratory   Acute respiratory failure with hypoxia- Bi Pap per EMS   Acute respiratory failure     Other   DYSLIPIDEMIA   Last Assessment & Plan   11/22/2012 Office Visit Written 11/22/2012 11:16 AM by Gaylord Shih, MD     She is on too high  dose of simvastatin being on amlodipine. Says were discontinue them amlodipine, we'll not change her. She is overdue labs and will arrange for her to have fasting comprehensive metabolic profile and lipids.    TOBACCO USER   Last Assessment & Plan   04/05/2013 Office Visit Written 04/05/2013  2:24 PM by Gaylord Shih, MD     She must stop smoking. She has progressive coronary disease as well as now peripheral vascular disease which is asymptomatic. I've offered her Wellbutrin    Hx of colonoscopy with polypectomy   Last Assessment & Plan   07/16/2011 Office Visit Written 07/17/2011  1:20 PM by Nira Retort, NP     73 year old female with history of hyperplastic polyps on colonoscopy in 2007. No hx of adenomatous polyps. She presents today devoid of any lower GI symptoms. She is doing well without any change in bowel habits or evidence of melena or hematochezia. We discussed the need for updated colonoscopy, and it would be appropriate to wait until 2017 for the next colonoscopy. She is agreeable to this and understands she needs to call our office with any changes in the interim. We discussed the signs and symptoms to watch for. We will see her back as needed, and we  will plan for a colonoscopy in 5 years.     S/P coronary artery stent placement DES-PLA of RCA and occlusion of PDA 07/17/14       Imaging: Dg Chest Portable 1 View  07/12/2014   CLINICAL DATA:  Shortness of breath  EXAM: PORTABLE CHEST - 1 VIEW  COMPARISON:  01/02/2013  FINDINGS: Cardiac shadow is again enlarged. Diffuse vascular congestion and pulmonary edema is noted. Right basilar infiltrate is noted as well. No acute bony abnormality is seen.  IMPRESSION: Changes consistent with CHF and apparent right basilar infiltrate.   Electronically Signed   By: Alcide Clever M.D.   On: 07/12/2014 15:44

## 2014-07-25 NOTE — Patient Instructions (Signed)
Your physician recommends that you schedule a follow-up appointment in: next Friday with Dr.Branch  Your physician has recommended you make the following change in your medication:    INCREASE Coreg to 25 mg twice a day    Please get lab work (BMET,Magnesium)  Retail banker rep is going to come to your home to adjust your vest today.   Thank you for choosing [facility] !

## 2014-07-25 NOTE — Assessment & Plan Note (Addendum)
She is completely asymptomatic. EKG, however, is concerning, as she has continued to have nonsustained ventricular tachycardia. Shall, so has a complaint right bundle-branch block. Heart is very irregular with frequent PVCs. I have had the EKG reviewed on side by Dr. Wyline Mood.   I will plan on increasing her carvedilol from 12.5 mg twice a day to 25 mg twice a day. After discussion with Dr. Wyline Mood is possible that she will also be titrated up to 37.5 mg twice a day on followup appointments. I will check a BMET, and magnesium.  Concerning ill fitting Life Vest, we will call the representative in our area, who will come out to her house and possibly change out the lights as the one that fits her better. He is also informed that the patches are irritating her skin and he will need to change out. The patches for hypoallergenic patches. She will followup with Dr. branch in approximately 2 weeks for reevaluation of her symptoms. Repeat EKG and further titration of carvedilol at his discretion.  I advised her not to drive until she has been titrated up to optimal medical therapy, and after review of repeat echo in approximately 3 months. She may walk daily as long as she does not exert herself or lift anything over 20 pounds.

## 2014-07-25 NOTE — Assessment & Plan Note (Signed)
The patient does not appear to fluid overloaded. There is no evidence of edema. Lungs are clear. We will continue her on Lasix and spironolactone as directed. Labs as ordered.

## 2014-07-25 NOTE — Progress Notes (Signed)
HPI: Nicole Clements is a 73 year old female patient of Dr.Branch  that we follow for ongoing assessment and management of CAD with most recent cardiac catheterization in April of 2040 with severe RCA disease, LVEF 45%. She is treated medically. Most recent echo in March of 2014 with an LVEF of 25%. Other history includes peripheral arterial disease with severe left common iliac disease noted on cath.  The patient was recently admitted to [facility] on 07/12/2014 in the setting of decompensated systolic heart failure, with increasing dyspnea on exertion. She was seen at [facility] emergency room, treated with IV Lasix and BiPAP, had an episode of nonsustained ventricular tachycardiac, with an echo showing an EF of 20% with grade 2 diastolic dysfunction.She was transferred to [facility].  She had cardiac catheterization on 07/07/2014 with a drug-eluting stent to the right coronary artery. She was sent home with a life vest, and is to be seen in our office on post hospital followup. She was continued on carvedilol 12.5 mg twice a day, aspirin, spironolactone, and Plavix.  She unfortunately continues to smoke. She states that the Life Vest is uncomfortable, ill fitting and also that there is some skin irritation of the patches.She denies palpitations, dizziness or dyspnea. She is medically compliant. She wants to know if she can drive and exercise.   Allergies  Allergen Reactions  . Sulfa Antibiotics Rash    Current Outpatient Prescriptions  Medication Sig Dispense Refill  . acetaminophen (TYLENOL) 325 MG tablet Take 2 tablets (650 mg total) by mouth every 4 (four) hours as needed for headache or mild pain.      [name] aspirin EC 81 MG EC tablet Take 1 tablet (81 mg total) by mouth daily.      . carvedilol (COREG) 12.5 MG tablet Take 1 tablet (12.5 mg total) by mouth 2 (two) times daily.  60 tablet  0  . Cholecalciferol (VITAMIN D3) 2000 UNITS capsule Take 2,000 Units by mouth daily.        . clopidogrel  (PLAVIX) 75 MG tablet Take 1 tablet (75 mg total) by mouth daily with breakfast.  30 tablet  11  . furosemide (LASIX) 80 MG tablet Take 1 tablet (80 mg total) by mouth daily.  30 tablet  11  . losartan (COZAAR) 100 MG tablet Take 1 tablet (100 mg total) by mouth daily.  30 tablet  8  . nitroGLYCERIN (NITROSTAT) 0.4 MG SL tablet Place 1 tablet (0.4 mg total) under the tongue every 5 (five) minutes x 3 doses as needed for chest pain.  25 tablet  2  . Omega-3 Fatty Acids (FISH OIL) 1000 MG CAPS Take 1,000 mg by mouth daily.       . simvastatin (ZOCOR) 40 MG tablet Take 1 tablet (40 mg total) by mouth at bedtime.  90 tablet  3  . spironolactone (ALDACTONE) 12.5 mg TABS tablet Take 0.5 tablets (12.5 mg total) by mouth 2 (two) times daily.  30 tablet  11   No current facility-administered medications for this visit.    Past Medical History  Diagnosis Date  . Myocardial infarct 07/22/1996    st.elevated inferior wall infarct  . CAD (coronary artery disease) April 2014    RCA disease- Med Rx   . Hypertension   . Hypercholesterolemia   . S/P colonoscopy August 2007    hyperplastic polyps, rare sigmoid and descending colon diverticulosis, small internal hemorrhoids  . CHF (congestive heart failure) 12/31/2012  . Ischemic cardiomyopathy  EF 25%-45%  . PVC's (premature ventricular contractions)   . IVCD (intraventricular conduction defect)   . S/P coronary artery stent placement DES-PLA of RCA and occlusion of PDA 07/17/14 07/18/2014    Past Surgical History  Procedure Laterality Date  . Cardiac catheterization  April 2014    Med Rx  . S/p hysterectomy      VQM:GQQPYP of systems complete and found to be negative unless listed above  PHYSICAL EXAM BP 148/78  Pulse 60  Ht 5\' 3"  (1.6 m)  Wt 166 lb (75.297 kg)  BMI 29.41 kg/m2 General: Well developed, well nourished, in no acute distress Head: Eyes PERRLA, No xanthomas.   Normal cephalic and atramatic  Lungs: Clear bilaterally to  auscultation and percussion. Heart: HRIR S1 S2, without MRG.  Pulses are 2+ & equal.            No carotid bruit. No JVD.  No abdominal bruits. No femoral bruits. Abdomen: Bowel sounds are positive, abdomen soft and non-tender without masses or                  Hernia's noted. Msk:  Back normal, normal gait. Normal strength and tone for age. Life Vest in place. Extremities: No clubbing, cyanosis or edema.  DP +1 Neuro: Alert and oriented X 3. Psych:  Good affect, responds appropriately   EKG:  SR with RBBB, and NSVT. Rate of 150 bpm.  ASSESSMENT AND PLAN

## 2014-07-25 NOTE — Assessment & Plan Note (Signed)
As stated, EKG reveals evidence of NSVT. There is also bigeminy, and frequent ventricular ectopy. Labs are ordered.

## 2014-07-26 LAB — BASIC METABOLIC PANEL
BUN: 9 mg/dL (ref 6–23)
CALCIUM: 9.5 mg/dL (ref 8.4–10.5)
CHLORIDE: 101 meq/L (ref 96–112)
CO2: 27 meq/L (ref 19–32)
Creat: 0.99 mg/dL (ref 0.50–1.10)
Glucose, Bld: 96 mg/dL (ref 70–99)
Potassium: 3.7 mEq/L (ref 3.5–5.3)
SODIUM: 139 meq/L (ref 135–145)

## 2014-07-26 LAB — MAGNESIUM: MAGNESIUM: 1.9 mg/dL (ref 1.5–2.5)

## 2014-07-26 NOTE — Addendum Note (Signed)
Addended by: Tenna Delaine T on: 07/26/2014 12:00 PM   Modules accepted: Orders

## 2014-07-28 DIAGNOSIS — I255 Ischemic cardiomyopathy: Secondary | ICD-10-CM | POA: Diagnosis not present

## 2014-07-28 DIAGNOSIS — I10 Essential (primary) hypertension: Secondary | ICD-10-CM | POA: Diagnosis not present

## 2014-07-28 DIAGNOSIS — I2510 Atherosclerotic heart disease of native coronary artery without angina pectoris: Secondary | ICD-10-CM | POA: Diagnosis not present

## 2014-07-28 DIAGNOSIS — Z95810 Presence of automatic (implantable) cardiac defibrillator: Secondary | ICD-10-CM | POA: Diagnosis not present

## 2014-07-28 DIAGNOSIS — I5023 Acute on chronic systolic (congestive) heart failure: Secondary | ICD-10-CM | POA: Diagnosis not present

## 2014-07-28 DIAGNOSIS — Z720 Tobacco use: Secondary | ICD-10-CM | POA: Diagnosis not present

## 2014-08-01 DIAGNOSIS — I5023 Acute on chronic systolic (congestive) heart failure: Secondary | ICD-10-CM | POA: Diagnosis not present

## 2014-08-01 DIAGNOSIS — I255 Ischemic cardiomyopathy: Secondary | ICD-10-CM | POA: Diagnosis not present

## 2014-08-01 DIAGNOSIS — I10 Essential (primary) hypertension: Secondary | ICD-10-CM | POA: Diagnosis not present

## 2014-08-01 DIAGNOSIS — Z720 Tobacco use: Secondary | ICD-10-CM | POA: Diagnosis not present

## 2014-08-01 DIAGNOSIS — I2510 Atherosclerotic heart disease of native coronary artery without angina pectoris: Secondary | ICD-10-CM | POA: Diagnosis not present

## 2014-08-01 DIAGNOSIS — Z95810 Presence of automatic (implantable) cardiac defibrillator: Secondary | ICD-10-CM | POA: Diagnosis not present

## 2014-08-04 ENCOUNTER — Encounter: Payer: Self-pay | Admitting: Cardiology

## 2014-08-04 ENCOUNTER — Ambulatory Visit (INDEPENDENT_AMBULATORY_CARE_PROVIDER_SITE_OTHER): Payer: Medicare Other | Admitting: Cardiology

## 2014-08-04 VITALS — BP 112/58 | HR 70 | Ht 63.0 in | Wt 168.0 lb

## 2014-08-04 DIAGNOSIS — I472 Ventricular tachycardia: Secondary | ICD-10-CM

## 2014-08-04 DIAGNOSIS — I5022 Chronic systolic (congestive) heart failure: Secondary | ICD-10-CM

## 2014-08-04 DIAGNOSIS — I4729 Other ventricular tachycardia: Secondary | ICD-10-CM

## 2014-08-04 DIAGNOSIS — I2510 Atherosclerotic heart disease of native coronary artery without angina pectoris: Secondary | ICD-10-CM | POA: Diagnosis not present

## 2014-08-04 DIAGNOSIS — I255 Ischemic cardiomyopathy: Secondary | ICD-10-CM

## 2014-08-04 MED ORDER — ATORVASTATIN CALCIUM 80 MG PO TABS: 80.0000 mg | ORAL_TABLET | Freq: Every day | ORAL | Status: DC

## 2014-08-04 NOTE — Progress Notes (Signed)
Clinical Summary Nicole Clements is a 73 y.o.female seen today for follow up of the following medical problems. This is our first visit together, last seen by NP Nicole Clements.   1. CAD  - remote hx of inferior MI. Recent admit 07/2014 with CHF and troponin elecation - cath 07/2014, LM 30% distal, LAD mid 50%, LCX ostial 50% and mid 50%, small intermediate Nicole Clements 70%. RCA diffuse 50%, distal RCA with ulcerated 90% lesion, PDA 80-90% too small for PCI. LVEF 30%. Received DES to distal RCA, post procedure the PDA became occluded.  - echo 07/2014 LVEF 20%, inferior akinesis, grade II diastolic dysfunction.  - denies any chest pain.    2. Chronic systolic HF - 07/2014 LVEF 20% - recent admit earlier this month with volume overload, she had run out of her lasix at home. Diuresed 8.7 liters - reports breathing is well. Denies any DOE, no orthopnea, no LE edema - compliant with meds.  - weighing daily, stable 163 lbs.   - last visit her coreg was increased to 25mg  bid. Mild lightheadedness with standing, but otherwise no other symptoms.   3. NSVT - noted on recent admission, she was discharged with a life vest - she has had some troubles with the fitting of the life vest, the [employer] reps recently visited her house and helped make adjustments.    4.Mitral regurgitation - mild to moderate by echo - denies any significant SOB or LE edema     Past Medical History  Diagnosis Date  . Myocardial infarct 07/22/1996    st.elevated inferior wall infarct  . CAD (coronary artery disease) April 2014    RCA disease- Med Rx   . Hypertension   . Hypercholesterolemia   . S/P colonoscopy August 2007    hyperplastic polyps, rare sigmoid and descending colon diverticulosis, small internal hemorrhoids  . CHF (congestive heart failure) 12/31/2012  . Ischemic cardiomyopathy     EF 25%-45%  . PVC's (premature ventricular contractions)   . IVCD (intraventricular conduction defect)   . S/P coronary artery stent  placement DES-Nicole of RCA and occlusion of PDA 07/17/14 07/18/2014     Allergies  Allergen Reactions  . Sulfa Antibiotics Rash     Current Outpatient Prescriptions  Medication Sig Dispense Refill  . acetaminophen (TYLENOL) 325 MG tablet Take 2 tablets (650 mg total) by mouth every 4 (four) hours as needed for headache or mild pain.      [name] aspirin EC 81 MG EC tablet Take 1 tablet (81 mg total) by mouth daily.      . carvedilol (COREG) 25 MG tablet Take 1 tablet (25 mg total) by mouth 2 (two) times daily.  60 tablet  6  . Cholecalciferol (VITAMIN D3) 2000 UNITS capsule Take 2,000 Units by mouth daily.        . clopidogrel (PLAVIX) 75 MG tablet Take 1 tablet (75 mg total) by mouth daily with breakfast.  30 tablet  11  . furosemide (LASIX) 80 MG tablet Take 1 tablet (80 mg total) by mouth daily.  30 tablet  11  . losartan (COZAAR) 100 MG tablet Take 1 tablet (100 mg total) by mouth daily.  30 tablet  8  . nitroGLYCERIN (NITROSTAT) 0.4 MG SL tablet Place 1 tablet (0.4 mg total) under the tongue every 5 (five) minutes x 3 doses as needed for chest pain.  25 tablet  2  . Omega-3 Fatty Acids (FISH OIL) 1000 MG CAPS Take 1,000 mg by mouth  daily.       . simvastatin (ZOCOR) 40 MG tablet Take 1 tablet (40 mg total) by mouth at bedtime.  90 tablet  3  . spironolactone (ALDACTONE) 12.5 mg TABS tablet Take 0.5 tablets (12.5 mg total) by mouth 2 (two) times daily.  30 tablet  11   No current facility-administered medications for this visit.     Past Surgical History  Procedure Laterality Date  . Cardiac catheterization  April 2014    Med Rx  . S/p hysterectomy       Allergies  Allergen Reactions  . Sulfa Antibiotics Rash      Family History  Problem Relation Age of Onset  . Colon cancer Neg Hx   . Hypertension Sister   . Diabetes Mellitus II Sister   . Hypertension Brother   . Diabetes Mellitus II Brother   . Hypertension Brother   . Diabetes Mellitus II Brother   . Hypertension  Brother   . Diabetes Mellitus II Brother      Social History Ms. Caravello reports that she has been smoking Cigarettes.  She has a 25 pack-year smoking history. She has never used smokeless tobacco. Ms. Crumrine reports that she does not drink alcohol.   Review of Systems CONSTITUTIONAL: No weight loss, fever, chills, weakness or fatigue.  HEENT: Eyes: No visual loss, blurred vision, double vision or yellow sclerae.No hearing loss, sneezing, congestion, runny nose or sore throat.  SKIN: No rash or itching.  CARDIOVASCULAR: per HPI RESPIRATORY: No shortness of breath, cough or sputum.  GASTROINTESTINAL: No anorexia, nausea, vomiting or diarrhea. No abdominal pain or blood.  GENITOURINARY: No burning on urination, no polyuria NEUROLOGICAL: No headache, dizziness, syncope, paralysis, ataxia, numbness or tingling in the extremities. No change in bowel or bladder control.  MUSCULOSKELETAL: No muscle, back pain, joint pain or stiffness.  LYMPHATICS: No enlarged nodes. No history of splenectomy.  PSYCHIATRIC: No history of depression or anxiety.  ENDOCRINOLOGIC: No reports of sweating, cold or heat intolerance. No polyuria or polydipsia.  [name]   Physical Examination p 70 bp 112/58 Wt 168 lbs BMI 30 Gen: resting comfortably, no acute distress HEENT: no scleral icterus, pupils equal round and reactive, no palptable cervical adenopathy,  CV: RRR, no m/r/g, no JVD, no carotid bruits Resp: Clear to auscultation bilaterally GI: abdomen is soft, non-tender, non-distended, normal bowel sounds, no hepatosplenomegaly MSK: extremities are warm, no edema.  Skin: warm, no rash Neuro:  no focal deficits Psych: appropriate affect   Diagnostic Studies  02/2013 Cath  Procedural Findings:  Hemodynamics:  AO 116/45 with a mean of 70  LV 120/18  Coronary angiography:  Coronary dominance: right  Left mainstem: The left main is patent with 30% distal left main stenosis as the vessel trifurcates into the  LAD, intermediate Nicole Clements, and left circumflex.  Left anterior descending (LAD): The LAD is patent with diffuse disease noted. There is minimal calcification present. The first diagonal is patent with 30-50% diffuse disease. The LAD after the first septal perforator has 40-50% stenosis. The vessel reaches the apex and wraps around the left ventricular apex without significant stenosis.  Left circumflex (LCx): The intermediate Nicole Clements is small in caliber and diffusely diseased with 50-60% proximal vessel stenosis the AV groove circumflex is diffusely diseased with 50% ostial stenosis and 30% mid stenosis it supplies 2 small posterolateral Nicole Clements  Right coronary artery (RCA): The RCA is diffusely diseased. There is significant tortuosity, especially around the junction of the mid and distal vessel. The  vessel is diffusely calcified. The mid vessel has tandem 50% stenoses. The distal vessel has 80-90 % stenosis just before the bifurcation of the PDA and posterior AV segment. The PDA and posterior AV segment with 3 posterolateral branches are patent.  Left ventriculography: Left ventricular systolic function is moderately depressed. There is severe hypokinesis of the basal and midinferior walls. The anterolateral and apical walls contract normally. The left ventricular ejection fraction is estimated at 45%.  Abdominal aortogram: The abdominal aorta is patent. The renal arteries are single bilaterally and they are widely patent. There appears to be nonobstructive stenosis of the right common iliac artery at the aortoiliac junction. The left common iliac artery appears to have significant ostial stenosis estimated at at least 75% angiographically. The visualized portions of the external iliac and common femoral arteries are patent  Final Conclusions:  1. Severe single-vessel coronary artery disease involving the distal right coronary artery  2. Moderate LV dysfunction with segmental severe hypokinesis of the  inferior wall and normal contraction of the antero-apex with estimated left ventricular ejection fraction of 45%.  3. Severe left common iliac artery stenosis  Recommendations: The patient has severe distal RCA disease. She has a known history of inferior wall infarction and remote PCA. This fits with that clinical history. She has nonobstructive disease of the left coronary artery. Her left ventricular ejection fraction by ventriculography is clearly greater than 35%. With her history of previous MI, I do not think PCI is indicated with an absence of angina. Will carefully discussed symptoms with the patient, but I am inclined to treat her medically. Will also review indication for consideration of peripheral intervention for her iliac disease.    01/2013 Echo  Study Conclusions  - Left ventricle: The cavity size was mildly to moderately dilated. Wall thickness was normal. Systolic function was severely reduced. The estimated ejection fraction was 25%. Severe diffuse hypokinesis. - Aortic valve: Mildly calcified annulus. Trileaflet. - Mitral valve: Calcified annulus. - Left atrium: The atrium was moderately dilated. - Right ventricle: The cavity size was normal. Wall thickness was mildly to moderately increased. - Right atrium: The atrium was mildly dilated. - Atrial septum: No defect or patent foramen ovale was identified. Impressions:  - Compared to the prior study of 03/13/11, there has been substantial deterioration in LV systolic function.   07/2014 Echo Study Conclusions  - Left ventricle: The cavity size was severely dilated. Wall thickness was normal. The estimated ejection fraction was 20%. Diffuse hypokinesis. There is akinesis of the inferolateral and inferior myocardium. Features are consistent with a pseudonormal left ventricular filling pattern, with concomitant abnormal relaxation and increased filling pressure (grade 2 diastolic dysfunction). Doppler parameters are  consistent with high ventricular filling pressure. - Mitral valve: There was mild to moderate regurgitation. - Left atrium: The atrium was moderately dilated.  Impressions:  - Global hypokinesis; inferior and inferolateral akinesis; overall severely reduced LV function; grade 2 diastolic dysfunction with elevated left ventricular filling pressures, moderate LAE; mild to moderate MR.  07/2014 Cath PROCEDURAL FINDINGS  Hemodynamics:  AO 122/48  LV 118/14  RA 9  RV 41/11  PA 37/5 with a mean of 21  PCWP 27  Oxygen saturations  AO 90  PA 56  Cardiac output 3.5  Cardiac index 2.0  Coronary angiography:  Coronary dominance: right  Left mainstem: The left mainstem is patent with 30% distal left main stenosis.  Left anterior descending (LAD): The LAD is diffusely diseased in the midportion. There are sequential  50% stenoses, unchanged from previous study. The first diagonal Kory Panjwani is large in distribution with diffuse nonobstructive disease noted. The mid and distal LAD are patent without significant disease.  Left circumflex (LCx): The left circumflex has 50% ostial stenosis, 50% mid vessel stenosis, and to OM branches with no significant disease. There is an intermediate Nicole Clements is diffusely diseased with up to 70% stenosis. This vessel is small in caliber.  Right coronary artery (RCA): Dominant vessel. Tortuous with diffuse nonobstructive stenosis throughout the proximal and mid portions. There are diffuse 50% stenosis present. The distal vessel has an ulcerated-appearing irregular 90% stenosis involving the bifurcation of the PDA and posterolateral branches. The posterolateral branches are patent with mild diffuse disease. The PDA arises an acute angulation with 30-40% stenosis. The mid body of the PDA has an 80-90% stenosis in an area where the vessel is too small for PCI.  Left ventriculography: The ventricle appears the synchronous. The basal and midinferior wall are akinetic. The basal  inferolateral wall is severely hypokinetic. The anterolateral wall and apex contract normally. The estimated LVEF is 30%.  PCI Procedure Note: Following the diagnostic procedure, the decision was made to proceed with PCI of the distal RCA. The lesion is complex, there is a large amount of myocardium involved. I felt there was a risk of potentially compromising the PDA Nicole Clements, but overall in my opinion the risk/benefit was favorable for PCI considering the severe stenosis involving the entire RCA distribution. The sheath was upsized to a 6 [place]. Weight-based bivalirudin was given for anticoagulation. The patient was loaded with Plavix 600 mg on the table. Once a therapeutic ACT was achieved, a 6 [place] JR 4 guide catheter was inserted. A cougar coronary guidewire was used to cross the lesion into the Nicole Clements. The lesion was predilated with a 2.5 x 15 mm balloon. The lesion was then stented with a 3.0 by 16mm Promus DES stent. The stent was postdilated with a 3.25 mm noncompliant balloon to 18 atmospheres. The PDA was stented across as the stent extended from the distal RCA proximal Nicole Nicole Clements. The PDA was totally occluded. I made a long attempt with a whisper wire using multiple bands on the tip of the wire, but all of these attempts were unsuccessful at rewiring the PDA. The patient was completely asymptomatic and specifically denied any chest pain. Her hemodynamics remained stable and there were no changes in her rhythm or blood pressure. A final image of the left coronary artery was obtained and this demonstrated a collateral from the LAD to the PDA Nicole Clements. Following PCI, there was 0% residual stenosis and TIMI-3 flow. Final angiography confirmed an excellent result. Femoral hemostasis was achieved will be achieved with manual hemostasis. The patient tolerated the PCI procedure well. There were no immediate procedural complications. The patient was transferred to the post catheterization recovery area for  further monitoring.  PCI Data:  Vessel - RCA/Segment - distal  Percent Stenosis (pre) 90  TIMI-flow 3  Stent 3.0 by 16mm Promus DES  Percent Stenosis (post) 0  TIMI-flow (post) 3  Contrast: 175 cc Omnipaque  Radiation dose/Fluoro time: 15.5 minutes  Estimated Blood Loss: Minimal  Final Conclusions:  1. Nonobstructive disease involving the LAD, left circumflex, and OM/diagonal side branches  2. Severe stenosis of the distal RCA bifurcation, treated successfully with a drug-eluting stent into the Nicole Crosley Stejskal with residual occlusion of the PDA Babs Dabbs  3. Severe segmental LV systolic dysfunction  Recommendations: Dual antiplatelet therapy with aspirin and Plavix  for at least 12 months. Will cycle cardiac enzymes since the PDA Nicoya Friel is occluded after stenting.   Assessment and Plan 1. CAD - no current symptoms, continue DAPT at least until 07/2015 - continue risk factor modification and secondary prevention  2. Chronic systolic HF - echo 07/2014 LVEF 20%, NYHA II, she does not have ICD as she is undergoing trial of medical therapy, does have lifevest for NSVT - at goal on medical therapy, plan for repeat echo 10/2014. Pending results may need consideration for ICD  3. NSVT - Continue lifevest, continue beta blocker  4. Mitral regurgitation Mild to moderate by last echo, continue to follow clinically.   F/u 1 month    Antoine Poche, M.D.

## 2014-08-04 NOTE — Patient Instructions (Addendum)
Your physician recommends that you schedule a follow-up appointment in: 1 month      STOP simvastatin   START Lipitor 80 mg daily      Thank you for choosing  [facility] !

## 2014-08-23 DIAGNOSIS — I255 Ischemic cardiomyopathy: Secondary | ICD-10-CM | POA: Diagnosis not present

## 2014-08-23 DIAGNOSIS — Z720 Tobacco use: Secondary | ICD-10-CM | POA: Diagnosis not present

## 2014-08-23 DIAGNOSIS — I2510 Atherosclerotic heart disease of native coronary artery without angina pectoris: Secondary | ICD-10-CM | POA: Diagnosis not present

## 2014-08-23 DIAGNOSIS — Z95810 Presence of automatic (implantable) cardiac defibrillator: Secondary | ICD-10-CM | POA: Diagnosis not present

## 2014-08-23 DIAGNOSIS — I10 Essential (primary) hypertension: Secondary | ICD-10-CM | POA: Diagnosis not present

## 2014-08-23 DIAGNOSIS — I5023 Acute on chronic systolic (congestive) heart failure: Secondary | ICD-10-CM | POA: Diagnosis not present

## 2014-09-13 ENCOUNTER — Ambulatory Visit (INDEPENDENT_AMBULATORY_CARE_PROVIDER_SITE_OTHER): Payer: Medicare Other | Admitting: Cardiology

## 2014-09-13 ENCOUNTER — Encounter: Payer: Self-pay | Admitting: Cardiology

## 2014-09-13 VITALS — BP 124/70 | HR 62 | Ht 63.0 in | Wt 160.0 lb

## 2014-09-13 DIAGNOSIS — I5022 Chronic systolic (congestive) heart failure: Secondary | ICD-10-CM

## 2014-09-13 DIAGNOSIS — I255 Ischemic cardiomyopathy: Secondary | ICD-10-CM

## 2014-09-13 DIAGNOSIS — I472 Ventricular tachycardia: Secondary | ICD-10-CM | POA: Diagnosis not present

## 2014-09-13 DIAGNOSIS — E785 Hyperlipidemia, unspecified: Secondary | ICD-10-CM | POA: Diagnosis not present

## 2014-09-13 DIAGNOSIS — I2510 Atherosclerotic heart disease of native coronary artery without angina pectoris: Secondary | ICD-10-CM

## 2014-09-13 DIAGNOSIS — I4729 Other ventricular tachycardia: Secondary | ICD-10-CM

## 2014-09-13 MED ORDER — PRAVASTATIN SODIUM 80 MG PO TABS: 80.0000 mg | ORAL_TABLET | Freq: Every evening | ORAL | Status: DC

## 2014-09-13 NOTE — Patient Instructions (Signed)
Your physician recommends that you schedule a follow-up appointment in: 3 MONTHS WITH DR [facility]  Your physician has requested that you have an echocardiogram. Echocardiography is a painless test that uses sound waves to create images of your heart. It provides your doctor with information about the size and shape of your heart and how well your heart's chambers and valves are working. This procedure takes approximately one hour. There are no restrictions for this procedure.  Your physician has recommended you make the following change in your medication:   START PRAVASTATIN 80 MG DAILY  Your physician recommends that you return for lab work BMP/MAG  Thank you for choosing [facility]!!

## 2014-09-13 NOTE — Progress Notes (Signed)
Clinical Summary Ms. Nierenberg is a 73 y.o.female seen today for follow up of the following medical problems.   1. CAD  - remote hx of inferior MI. Recent admit 07/2014 with CHF and troponin elevation - cath 07/2014, LM 30% distal, LAD mid 50%, LCX ostial 50% and mid 50%, small intermediate Yasmene Salomone 70%. RCA diffuse 50%, distal RCA with ulcerated 90% lesion, PDA 80-90% too small for PCI. LVEF 30%. Received DES to distal RCA, post procedure the PDA became occluded.  - echo 07/2014 LVEF 20%, inferior akinesis, grade II diastolic dysfunction.   - denies any chest pain since last visit. No significant SOB or DOE.    2. Chronic systolic HF - 07/2014 LVEF 20% - denies any significant SOB, LE edema, or DOE. Weight is stable at home in low 160s. - compliant with meds. Limiting sodium intake. Avoiding NSAIDs   3. NSVT - noted on recent admission, she was discharged with a life vest which she is compliant with wearing  4.Mitral regurgitation - mild to moderate by echo - denies any significant SOB or LE edema  5. Hyperlipidemia - atorvastatin too expensive, she has not been able to get at home.   Past Medical History  Diagnosis Date  . Myocardial infarct 07/22/1996    st.elevated inferior wall infarct  . CAD (coronary artery disease) April 2014    RCA disease- Med Rx   . Hypertension   . Hypercholesterolemia   . S/P colonoscopy August 2007    hyperplastic polyps, rare sigmoid and descending colon diverticulosis, small internal hemorrhoids  . CHF (congestive heart failure) 12/31/2012  . Ischemic cardiomyopathy     EF 25%-45%  . PVC's (premature ventricular contractions)   . IVCD (intraventricular conduction defect)   . S/P coronary artery stent placement DES-PLA of RCA and occlusion of PDA 07/17/14 07/18/2014     Allergies  Allergen Reactions  . Sulfa Antibiotics Rash     Current Outpatient Prescriptions  Medication Sig Dispense Refill  . acetaminophen (TYLENOL) 325 MG tablet  Take 2 tablets (650 mg total) by mouth every 4 (four) hours as needed for headache or mild pain.    [name] aspirin EC 81 MG EC tablet Take 1 tablet (81 mg total) by mouth daily.    [name] atorvastatin (LIPITOR) 80 MG tablet Take 1 tablet (80 mg total) by mouth daily. 90 tablet 3  . carvedilol (COREG) 25 MG tablet Take 1 tablet (25 mg total) by mouth 2 (two) times daily. 60 tablet 6  . Cholecalciferol (VITAMIN D3) 2000 UNITS capsule Take 2,000 Units by mouth daily.      . clopidogrel (PLAVIX) 75 MG tablet Take 1 tablet (75 mg total) by mouth daily with breakfast. 30 tablet 11  . furosemide (LASIX) 80 MG tablet Take 1 tablet (80 mg total) by mouth daily. 30 tablet 11  . losartan (COZAAR) 100 MG tablet Take 1 tablet (100 mg total) by mouth daily. 30 tablet 8  . nitroGLYCERIN (NITROSTAT) 0.4 MG SL tablet Place 1 tablet (0.4 mg total) under the tongue every 5 (five) minutes x 3 doses as needed for chest pain. 25 tablet 2  . Omega-3 Fatty Acids (FISH OIL) 1000 MG CAPS Take 1,000 mg by mouth daily.     [name] spironolactone (ALDACTONE) 25 MG tablet Take 12.5 mg by mouth 2 (two) times daily.     No current facility-administered medications for this visit.     Past Surgical History  Procedure Laterality Date  . Cardiac  catheterization  April 2014    Med Rx  . S/p hysterectomy       Allergies  Allergen Reactions  . Sulfa Antibiotics Rash      Family History  Problem Relation Age of Onset  . Colon cancer Neg Hx   . Hypertension Sister   . Diabetes Mellitus II Sister   . Hypertension Brother   . Diabetes Mellitus II Brother   . Hypertension Brother   . Diabetes Mellitus II Brother   . Hypertension Brother   . Diabetes Mellitus II Brother      Social History Ms. Speakman reports that she has been smoking Cigarettes.  She has a 25 pack-year smoking history. She has never used smokeless tobacco. Ms. Henson reports that she does not drink alcohol.   Review of Systems CONSTITUTIONAL: No weight loss,  fever, chills, weakness or fatigue.  HEENT: Eyes: No visual loss, blurred vision, double vision or yellow sclerae.No hearing loss, sneezing, congestion, runny nose or sore throat.  SKIN: No rash or itching.  CARDIOVASCULAR: per HPI RESPIRATORY: No shortness of breath, cough or sputum.  GASTROINTESTINAL: No anorexia, nausea, vomiting or diarrhea. No abdominal pain or blood.  GENITOURINARY: No burning on urination, no polyuria NEUROLOGICAL: No headache, dizziness, syncope, paralysis, ataxia, numbness or tingling in the extremities. No change in bowel or bladder control.  MUSCULOSKELETAL: No muscle, back pain, joint pain or stiffness.  LYMPHATICS: No enlarged nodes. No history of splenectomy.  PSYCHIATRIC: No history of depression or anxiety.  ENDOCRINOLOGIC: No reports of sweating, cold or heat intolerance. No polyuria or polydipsia.  [name]   Physical Examination p 62 bp 124/70 Wt 160 lbs BMI 28 Gen: resting comfortably, no acute distress HEENT: no scleral icterus, pupils equal round and reactive, no palptable cervical adenopathy,  CV: RRR, no m/r/g, no JVD, no carotid bruits Resp: Clear to auscultation bilaterally GI: abdomen is soft, non-tender, non-distended, normal bowel sounds, no hepatosplenomegaly MSK: extremities are warm, no edema.  Skin: warm, no rash Neuro:  no focal deficits Psych: appropriate affect   Diagnostic Studies 02/2013 Cath  Procedural Findings:  Hemodynamics:  AO 116/45 with a mean of 70  LV 120/18  Coronary angiography:  Coronary dominance: right  Left mainstem: The left main is patent with 30% distal left main stenosis as the vessel trifurcates into the LAD, intermediate Quinta Eimer, and left circumflex.  Left anterior descending (LAD): The LAD is patent with diffuse disease noted. There is minimal calcification present. The first diagonal is patent with 30-50% diffuse disease. The LAD after the first septal perforator has 40-50% stenosis. The vessel reaches  the apex and wraps around the left ventricular apex without significant stenosis.  Left circumflex (LCx): The intermediate Verenice Westrich is small in caliber and diffusely diseased with 50-60% proximal vessel stenosis the AV groove circumflex is diffusely diseased with 50% ostial stenosis and 30% mid stenosis it supplies 2 small posterolateral Makeila Yamaguchi  Right coronary artery (RCA): The RCA is diffusely diseased. There is significant tortuosity, especially around the junction of the mid and distal vessel. The vessel is diffusely calcified. The mid vessel has tandem 50% stenoses. The distal vessel has 80-90 % stenosis just before the bifurcation of the PDA and posterior AV segment. The PDA and posterior AV segment with 3 posterolateral branches are patent.  Left ventriculography: Left ventricular systolic function is moderately depressed. There is severe hypokinesis of the basal and midinferior walls. The anterolateral and apical walls contract normally. The left ventricular ejection fraction is estimated at 45%.  Abdominal aortogram: The abdominal aorta is patent. The renal arteries are single bilaterally and they are widely patent. There appears to be nonobstructive stenosis of the right common iliac artery at the aortoiliac junction. The left common iliac artery appears to have significant ostial stenosis estimated at at least 75% angiographically. The visualized portions of the external iliac and common femoral arteries are patent  Final Conclusions:  1. Severe single-vessel coronary artery disease involving the distal right coronary artery  2. Moderate LV dysfunction with segmental severe hypokinesis of the inferior wall and normal contraction of the antero-apex with estimated left ventricular ejection fraction of 45%.  3. Severe left common iliac artery stenosis  Recommendations: The patient has severe distal RCA disease. She has a known history of inferior wall infarction and remote PCA. This fits with  that clinical history. She has nonobstructive disease of the left coronary artery. Her left ventricular ejection fraction by ventriculography is clearly greater than 35%. With her history of previous MI, I do not think PCI is indicated with an absence of angina. Will carefully discussed symptoms with the patient, but I am inclined to treat her medically. Will also review indication for consideration of peripheral intervention for her iliac disease.    01/2013 Echo  Study Conclusions  - Left ventricle: The cavity size was mildly to moderately dilated. Wall thickness was normal. Systolic function was severely reduced. The estimated ejection fraction was 25%. Severe diffuse hypokinesis. - Aortic valve: Mildly calcified annulus. Trileaflet. - Mitral valve: Calcified annulus. - Left atrium: The atrium was moderately dilated. - Right ventricle: The cavity size was normal. Wall thickness was mildly to moderately increased. - Right atrium: The atrium was mildly dilated. - Atrial septum: No defect or patent foramen ovale was identified. Impressions:  - Compared to the prior study of 03/13/11, there has been substantial deterioration in LV systolic function.   07/2014 Echo Study Conclusions  - Left ventricle: The cavity size was severely dilated. Wall thickness was normal. The estimated ejection fraction was 20%. Diffuse hypokinesis. There is akinesis of the inferolateral and inferior myocardium. Features are consistent with a pseudonormal left ventricular filling pattern, with concomitant abnormal relaxation and increased filling pressure (grade 2 diastolic dysfunction). Doppler parameters are consistent with high ventricular filling pressure. - Mitral valve: There was mild to moderate regurgitation. - Left atrium: The atrium was moderately dilated.  Impressions:  - Global hypokinesis; inferior and inferolateral akinesis; overall severely reduced LV function; grade 2 diastolic  dysfunction with elevated left ventricular filling pressures, moderate LAE; mild to moderate MR.  07/2014 Cath PROCEDURAL FINDINGS  Hemodynamics:  AO 122/48  LV 118/14  RA 9  RV 41/11  PA 37/5 with a mean of 21  PCWP 27  Oxygen saturations  AO 90  PA 56  Cardiac output 3.5  Cardiac index 2.0  Coronary angiography:  Coronary dominance: right  Left mainstem: The left mainstem is patent with 30% distal left main stenosis.  Left anterior descending (LAD): The LAD is diffusely diseased in the midportion. There are sequential 50% stenoses, unchanged from previous study. The first diagonal Tylee Yum is large in distribution with diffuse nonobstructive disease noted. The mid and distal LAD are patent without significant disease.  Left circumflex (LCx): The left circumflex has 50% ostial stenosis, 50% mid vessel stenosis, and to OM branches with no significant disease. There is an intermediate Ossiel Marchio is diffusely diseased with up to 70% stenosis. This vessel is small in caliber.  Right coronary artery (RCA): Dominant vessel.  Tortuous with diffuse nonobstructive stenosis throughout the proximal and mid portions. There are diffuse 50% stenosis present. The distal vessel has an ulcerated-appearing irregular 90% stenosis involving the bifurcation of the PDA and posterolateral branches. The posterolateral branches are patent with mild diffuse disease. The PDA arises an acute angulation with 30-40% stenosis. The mid body of the PDA has an 80-90% stenosis in an area where the vessel is too small for PCI.  Left ventriculography: The ventricle appears the synchronous. The basal and midinferior wall are akinetic. The basal inferolateral wall is severely hypokinetic. The anterolateral wall and apex contract normally. The estimated LVEF is 30%.  PCI Procedure Note: Following the diagnostic procedure, the decision was made to proceed with PCI of the distal RCA. The lesion is complex, there is a  large amount of myocardium involved. I felt there was a risk of potentially compromising the PDA Keenan Dimitrov, but overall in my opinion the risk/benefit was favorable for PCI considering the severe stenosis involving the entire RCA distribution. The sheath was upsized to a 6 [place]. Weight-based bivalirudin was given for anticoagulation. The patient was loaded with Plavix 600 mg on the table. Once a therapeutic ACT was achieved, a 6 [place] JR 4 guide catheter was inserted. A cougar coronary guidewire was used to cross the lesion into the PLA Delise Simenson. The lesion was predilated with a 2.5 x 15 mm balloon. The lesion was then stented with a 3.0 by 16mm Promus DES stent. The stent was postdilated with a 3.25 mm noncompliant balloon to 18 atmospheres. The PDA was stented across as the stent extended from the distal RCA proximal PLA Ac Colan. The PDA was totally occluded. I made a long attempt with a whisper wire using multiple bands on the tip of the wire, but all of these attempts were unsuccessful at rewiring the PDA. The patient was completely asymptomatic and specifically denied any chest pain. Her hemodynamics remained stable and there were no changes in her rhythm or blood pressure. A final image of the left coronary artery was obtained and this demonstrated a collateral from the LAD to the PDA Felisa Zechman. Following PCI, there was 0% residual stenosis and TIMI-3 flow. Final angiography confirmed an excellent result. Femoral hemostasis was achieved will be achieved with manual hemostasis. The patient tolerated the PCI procedure well. There were no immediate procedural complications. The patient was transferred to the post catheterization recovery area for further monitoring.  PCI Data:  Vessel - RCA/Segment - distal  Percent Stenosis (pre) 90  TIMI-flow 3  Stent 3.0 by 16mm Promus DES  Percent Stenosis (post) 0  TIMI-flow (post) 3  Contrast: 175 cc Omnipaque  Radiation dose/Fluoro time: 15.5 minutes   Estimated Blood Loss: Minimal  Final Conclusions:  1. Nonobstructive disease involving the LAD, left circumflex, and OM/diagonal side branches  2. Severe stenosis of the distal RCA bifurcation, treated successfully with a drug-eluting stent into the PLA Livio Ledwith with residual occlusion of the PDA Jazmyne Beauchesne  3. Severe segmental LV systolic dysfunction  Recommendations: Dual antiplatelet therapy with aspirin and Plavix for at least 12 months. Will cycle cardiac enzymes since the PDA Laurelle Skiver is occluded after stenting.      Assessment and Plan  1. CAD - no current symptoms, continue DAPT at least until 07/2015 - continue risk factor modification and secondary prevention  2. Chronic systolic HF - echo 07/2014 LVEF 20%, NYHA II, she does not have ICD as she is undergoing trial of medical therapy, does have lifevest for NSVT - at  goal on medical therapy, plan for repeat echo 10/2014. Pending results may need consideration for ICD  3. NSVT - Continue lifevest, continue beta blocker  4. Mitral regurgitation Mild to moderate by last echo, continue to follow clinically.  5. Hyperlipidemia - unable to afford atorvastatin, will start pravastatin 80mg  daily.    F/u 3 months. She has echo scheduled for next month, pending results may need referral to EP for ICD consideration  Antoine PocheJonathan F. Kyland No, M.D.

## 2014-09-15 DIAGNOSIS — I10 Essential (primary) hypertension: Secondary | ICD-10-CM | POA: Diagnosis not present

## 2014-09-15 DIAGNOSIS — Z720 Tobacco use: Secondary | ICD-10-CM | POA: Diagnosis not present

## 2014-09-15 DIAGNOSIS — I5023 Acute on chronic systolic (congestive) heart failure: Secondary | ICD-10-CM | POA: Diagnosis not present

## 2014-09-15 DIAGNOSIS — I255 Ischemic cardiomyopathy: Secondary | ICD-10-CM | POA: Diagnosis not present

## 2014-09-15 DIAGNOSIS — Z95810 Presence of automatic (implantable) cardiac defibrillator: Secondary | ICD-10-CM | POA: Diagnosis not present

## 2014-09-15 DIAGNOSIS — I2510 Atherosclerotic heart disease of native coronary artery without angina pectoris: Secondary | ICD-10-CM | POA: Diagnosis not present

## 2014-10-12 ENCOUNTER — Encounter (HOSPITAL_COMMUNITY): Payer: Self-pay | Admitting: Cardiovascular Disease

## 2014-10-18 ENCOUNTER — Ambulatory Visit (HOSPITAL_COMMUNITY)
Admission: RE | Admit: 2014-10-18 | Discharge: 2014-10-18 | Disposition: A | Discharge status: Home / Self Care | Payer: Medicare Other | Source: Ambulatory Visit | Attending: Cardiology | Admitting: Cardiology

## 2014-10-18 DIAGNOSIS — I255 Ischemic cardiomyopathy: Secondary | ICD-10-CM | POA: Insufficient documentation

## 2014-10-18 DIAGNOSIS — I509 Heart failure, unspecified: Secondary | ICD-10-CM | POA: Diagnosis not present

## 2014-10-18 DIAGNOSIS — F17210 Nicotine dependence, cigarettes, uncomplicated: Secondary | ICD-10-CM | POA: Diagnosis not present

## 2014-10-18 DIAGNOSIS — I10 Essential (primary) hypertension: Secondary | ICD-10-CM | POA: Insufficient documentation

## 2014-10-18 DIAGNOSIS — I2510 Atherosclerotic heart disease of native coronary artery without angina pectoris: Secondary | ICD-10-CM | POA: Diagnosis not present

## 2014-10-18 DIAGNOSIS — I252 Old myocardial infarction: Secondary | ICD-10-CM | POA: Insufficient documentation

## 2014-10-18 DIAGNOSIS — I071 Rheumatic tricuspid insufficiency: Secondary | ICD-10-CM | POA: Insufficient documentation

## 2014-10-18 DIAGNOSIS — E785 Hyperlipidemia, unspecified: Secondary | ICD-10-CM | POA: Insufficient documentation

## 2014-10-18 DIAGNOSIS — I472 Ventricular tachycardia: Secondary | ICD-10-CM | POA: Insufficient documentation

## 2014-10-18 DIAGNOSIS — I5022 Chronic systolic (congestive) heart failure: Secondary | ICD-10-CM

## 2014-10-18 DIAGNOSIS — I319 Disease of pericardium, unspecified: Secondary | ICD-10-CM | POA: Diagnosis not present

## 2014-10-18 NOTE — Progress Notes (Signed)
  Echocardiogram 2D Echocardiogram has been performed.  Cyndee Giammarco 10/18/2014, 1:58 PM

## 2014-10-24 ENCOUNTER — Telehealth: Payer: Self-pay | Admitting: Cardiology

## 2014-10-24 ENCOUNTER — Telehealth: Payer: Self-pay | Admitting: *Deleted

## 2014-10-24 NOTE — Telephone Encounter (Signed)
Pt made aware, wants to see Dr. Ladona Ridgel ASAP as she does not want to wear the life vest. Will forward to scheduler

## 2014-10-24 NOTE — Telephone Encounter (Signed)
Will forward to Dr. Branch. 

## 2014-10-24 NOTE — Telephone Encounter (Signed)
Results of echo / and can she remove life vest / tgs

## 2014-10-24 NOTE — Telephone Encounter (Signed)
-----   Message from Antoine Poche, MD sent at 10/24/2014  4:42 PM EST ----- Echo shows heart function remains weak. She still requires the lifevest. Please also refer her to Dr Sharrell Ku for consideration for ICD   Nicole Ferry MD

## 2014-11-02 ENCOUNTER — Telehealth: Payer: Self-pay | Admitting: *Deleted

## 2014-11-02 NOTE — Telephone Encounter (Signed)
Pt originally did not think she could get a ride to [facility] st office but does not want to wait until March is uncomfortable in lifevest. Would call back when pt could arrange ride. F/u with pt today and pt agreed to be seen at [facility] st for EP consult. Pt is scheduled January 14th at 12:45pm at the [facility]. Pt made aware.

## 2014-11-02 NOTE — Telephone Encounter (Signed)
-----   Message from San Jetty sent at 10/26/2014  9:30 AM EST ----- Have to call the [place] office, we dont schedule for them. ----- Message -----    From: Albertine Patricia, CMA    Sent: 10/25/2014   3:44 PM      To: San Jetty  Do you make appt for [place]?  ----- Message -----    From: San Jetty    Sent: 10/25/2014  11:29 AM      To: Albertine Patricia, CMA  We have nothing available here, might have to go to [place] for first visit. Out next opening is march   ----- Message -----    From: Albertine Patricia, CMA    Sent: 10/24/2014   4:57 PM      To: San Jetty  Per Dr. Wyline Mood pt needs to be scheduled for consult with Dr. Ladona Ridgel possible ICD placement. Do we have anything for the 4th.?  Thanks   [employer]

## 2014-11-07 ENCOUNTER — Encounter: Payer: Self-pay | Admitting: Internal Medicine

## 2014-11-16 ENCOUNTER — Ambulatory Visit: Payer: Medicare Other | Admitting: Internal Medicine

## 2014-11-16 DIAGNOSIS — I25119 Atherosclerotic heart disease of native coronary artery with unspecified angina pectoris: Secondary | ICD-10-CM | POA: Diagnosis not present

## 2014-11-16 DIAGNOSIS — I110 Hypertensive heart disease with heart failure: Secondary | ICD-10-CM | POA: Diagnosis not present

## 2014-11-16 DIAGNOSIS — I252 Old myocardial infarction: Secondary | ICD-10-CM | POA: Diagnosis not present

## 2014-11-16 DIAGNOSIS — Z1211 Encounter for screening for malignant neoplasm of colon: Secondary | ICD-10-CM | POA: Diagnosis not present

## 2014-11-16 DIAGNOSIS — Z1389 Encounter for screening for other disorder: Secondary | ICD-10-CM | POA: Diagnosis not present

## 2014-11-16 DIAGNOSIS — Z720 Tobacco use: Secondary | ICD-10-CM | POA: Diagnosis not present

## 2014-11-16 DIAGNOSIS — I340 Nonrheumatic mitral (valve) insufficiency: Secondary | ICD-10-CM | POA: Diagnosis not present

## 2014-11-16 DIAGNOSIS — I5022 Chronic systolic (congestive) heart failure: Secondary | ICD-10-CM | POA: Diagnosis not present

## 2014-11-16 DIAGNOSIS — I5030 Unspecified diastolic (congestive) heart failure: Secondary | ICD-10-CM | POA: Diagnosis not present

## 2014-11-16 DIAGNOSIS — Z0001 Encounter for general adult medical examination with abnormal findings: Secondary | ICD-10-CM | POA: Diagnosis not present

## 2014-11-16 DIAGNOSIS — E782 Mixed hyperlipidemia: Secondary | ICD-10-CM | POA: Diagnosis not present

## 2014-11-20 ENCOUNTER — Encounter: Payer: Self-pay | Admitting: Internal Medicine

## 2014-12-07 ENCOUNTER — Encounter: Payer: Self-pay | Admitting: Internal Medicine

## 2014-12-07 ENCOUNTER — Encounter (INDEPENDENT_AMBULATORY_CARE_PROVIDER_SITE_OTHER): Payer: Medicare Other

## 2014-12-07 ENCOUNTER — Encounter: Payer: Self-pay | Admitting: *Deleted

## 2014-12-07 ENCOUNTER — Ambulatory Visit (INDEPENDENT_AMBULATORY_CARE_PROVIDER_SITE_OTHER): Payer: Medicare Other | Admitting: Internal Medicine

## 2014-12-07 VITALS — BP 110/72 | HR 56 | Ht 62.5 in | Wt 168.0 lb

## 2014-12-07 DIAGNOSIS — I472 Ventricular tachycardia: Secondary | ICD-10-CM | POA: Diagnosis not present

## 2014-12-07 DIAGNOSIS — I255 Ischemic cardiomyopathy: Secondary | ICD-10-CM | POA: Diagnosis not present

## 2014-12-07 DIAGNOSIS — I493 Ventricular premature depolarization: Secondary | ICD-10-CM

## 2014-12-07 DIAGNOSIS — I4729 Other ventricular tachycardia: Secondary | ICD-10-CM

## 2014-12-07 MED ORDER — POTASSIUM CHLORIDE CRYS ER 20 MEQ PO TBCR: 20.0000 meq | EXTENDED_RELEASE_TABLET | Freq: Every day | ORAL | Status: DC

## 2014-12-07 NOTE — Progress Notes (Signed)
Patient Care Team: Lupita Raider, MD as PCP - General (Family Medicine) Gretta Cool, MD (Obstetrics and Gynecology) Darden Amber, OD as Referring Physician (Optometry) West Bali, MD (Gastroenterology) Gaylord Shih, MD as Consulting Physician (Cardiology)   HPI  Nicole Clements is a 74 y.o. female Seen in follow-up for left ventricular dysfunction. She has a history of ischemic heart disease with prior stenting of the RCA and RPDA.  Ejection fraction most recently 12/15 was 15-20%. Catheterization 9/15 demonstrated no obstructive disease and LV EF of about 30%.  She has history of frequent ventricular ectopy.  Past Medical History  Diagnosis Date  . Myocardial infarct 07/22/1996    st.elevated inferior wall infarct  . CAD (coronary artery disease) April 2014    RCA disease- Med Rx   . Hypertension   . Hypercholesterolemia   . S/P colonoscopy August 2007    hyperplastic polyps, rare sigmoid and descending colon diverticulosis, small internal hemorrhoids  . CHF (congestive heart failure) 12/31/2012  . Ischemic cardiomyopathy     EF 25%-45%  . PVC's (premature ventricular contractions)   . IVCD (intraventricular conduction defect)   . S/P coronary artery stent placement DES-PLA of RCA and occlusion of PDA 07/17/14 07/18/2014    Past Surgical History  Procedure Laterality Date  . Cardiac catheterization  April 2014    Med Rx  . S/p hysterectomy    . Left and right heart catheterization with coronary angiogram N/A 07/17/2014    Procedure: LEFT AND RIGHT HEART CATHETERIZATION WITH CORONARY ANGIOGRAM;  Surgeon: Micheline Chapman, MD;  Location: [facility] CATH LAB;  Service: Cardiovascular;  Laterality: N/A;  . Percutaneous coronary stent intervention (pci-s)  07/17/2014    Procedure: PERCUTANEOUS CORONARY STENT INTERVENTION (PCI-S);  Surgeon: Micheline Chapman, MD;  Location: [facility] CATH LAB;  Service: Cardiovascular;;  Distal RCA    Current Outpatient Prescriptions  Medication  Sig Dispense Refill  . acetaminophen (TYLENOL) 325 MG tablet Take 2 tablets (650 mg total) by mouth every 4 (four) hours as needed for headache or mild pain.    [name] aspirin EC 81 MG EC tablet Take 1 tablet (81 mg total) by mouth daily.    . carvedilol (COREG) 25 MG tablet Take 1 tablet (25 mg total) by mouth 2 (two) times daily. 60 tablet 6  . Cholecalciferol (VITAMIN D3) 2000 UNITS capsule Take 2,000 Units by mouth daily.      . clopidogrel (PLAVIX) 75 MG tablet Take 1 tablet (75 mg total) by mouth daily with breakfast. 30 tablet 11  . furosemide (LASIX) 80 MG tablet Take 1 tablet (80 mg total) by mouth daily. 30 tablet 11  . losartan (COZAAR) 100 MG tablet Take 1 tablet (100 mg total) by mouth daily. 30 tablet 8  . nitroGLYCERIN (NITROSTAT) 0.4 MG SL tablet Place 1 tablet (0.4 mg total) under the tongue every 5 (five) minutes x 3 doses as needed for chest pain. 25 tablet 2  . Omega-3 Fatty Acids (FISH OIL) 1000 MG CAPS Take 1,000 mg by mouth daily.     . pravastatin (PRAVACHOL) 80 MG tablet Take 1 tablet (80 mg total) by mouth every evening. 90 tablet 3  . spironolactone (ALDACTONE) 25 MG tablet Take 12.5 mg by mouth 2 (two) times daily.     No current facility-administered medications for this visit.    Allergies  Allergen Reactions  . Sulfa Antibiotics Rash    Review of Systems negative except from HPI and PMH  Physical Exam BP 110/72 mmHg  Pulse 56  Ht 5' 2.5" (1.588 m)  Wt 168 lb (76.204 kg)  BMI 30.22 kg/m2 Well developed and well nourished in no acute distress HENT normal E scleral and icterus clear Neck Supple JVP flat; carotids brisk and full Clear to ausculation Wearing a LIFE VEST  Regular rate and rhythm, no murmurs gallops or rub Soft with active bowel sounds No clubbing cyanosis  Edema Alert and oriented, grossly normal motor and sensory function Skin Warm and Dry   Ischemic Cardiomyopathy  CHF chronic Systolic  Ventricular Ectopy//PVCs and  VT-Nonsustained  She's been wearing a LifeVest now for 5 months. Echocardiogram most recently demonstrated an ejection fraction of 15-20%. ECGs were reviewed from hospitalization and they demonstrated nonsustained ventricular tachycardia. It is imperative that we understand the ventricular ectopy burden prior to the size implantation as he could be potentially contributed to her LV dysfunction and by directing therapy we might be able to reverse her LV dysfunction.  In the interim, we will go ahead and schedule her for ICD implantation in the event that the burden is not sufficiently high she has been waiting now 5 months. She is further advised that in the absence of a history of syncope or sustained ventricular arrhythmias she is permitted to drive not withstanding the fact that she is wearing her LifeVest

## 2014-12-07 NOTE — Progress Notes (Signed)
Patient ID: Nicole Clements, female   DOB: 08/19/1941, 73 y.o.   MRN: [id number] [facility] 24 hour holter monitor applied to patient. 

## 2014-12-07 NOTE — Patient Instructions (Addendum)
Your physician has recommended you make the following change in your medication:  1) START Potassium 20 mEq daily   (Take 40 mEq for 3 days, then return to normal dosing)  Your physician has recommended that you wear a 24 hour holter monitor. Holter monitors are medical devices that record the heart's electrical activity. Doctors most often use these monitors to diagnose arrhythmias. Arrhythmias are problems with the speed or rhythm of the heartbeat. The monitor is a small, portable device. You can wear one while you do your normal daily activities. This is usually used to diagnose what is causing palpitations/syncope (passing out).  Your physician has recommended that you have a defibrillator inserted (we will need the holter monitor results before scheduling this procedure). An implantable cardioverter defibrillator (ICD) is a small device that is placed in your chest or, in rare cases, your abdomen. This device uses electrical pulses or shocks to help control life-threatening, irregular heartbeats that could lead the heart to suddenly stop beating (sudden cardiac arrest). Leads are attached to the ICD that goes into your heart. This is done in the hospital and usually requires an overnight stay.    Jullian Previti, RN will call you to arrange this procedure.

## 2014-12-13 DIAGNOSIS — I5022 Chronic systolic (congestive) heart failure: Secondary | ICD-10-CM | POA: Diagnosis not present

## 2014-12-13 DIAGNOSIS — I509 Heart failure, unspecified: Secondary | ICD-10-CM | POA: Diagnosis not present

## 2014-12-14 ENCOUNTER — Encounter: Payer: Self-pay | Admitting: Cardiology

## 2014-12-14 ENCOUNTER — Ambulatory Visit (INDEPENDENT_AMBULATORY_CARE_PROVIDER_SITE_OTHER): Payer: Medicare Other | Admitting: Cardiology

## 2014-12-14 VITALS — BP 126/98 | HR 70 | Ht 63.0 in | Wt 167.0 lb

## 2014-12-14 DIAGNOSIS — E785 Hyperlipidemia, unspecified: Secondary | ICD-10-CM | POA: Diagnosis not present

## 2014-12-14 DIAGNOSIS — I4729 Other ventricular tachycardia: Secondary | ICD-10-CM

## 2014-12-14 DIAGNOSIS — I255 Ischemic cardiomyopathy: Secondary | ICD-10-CM | POA: Diagnosis not present

## 2014-12-14 DIAGNOSIS — I472 Ventricular tachycardia: Secondary | ICD-10-CM | POA: Diagnosis not present

## 2014-12-14 DIAGNOSIS — I2510 Atherosclerotic heart disease of native coronary artery without angina pectoris: Secondary | ICD-10-CM | POA: Diagnosis not present

## 2014-12-14 DIAGNOSIS — I5022 Chronic systolic (congestive) heart failure: Secondary | ICD-10-CM

## 2014-12-14 LAB — BASIC METABOLIC PANEL WITH GFR
BUN: 11 mg/dL (ref 6–23)
CHLORIDE: 103 meq/L (ref 96–112)
CO2: 31 mEq/L (ref 19–32)
Calcium: 9.6 mg/dL (ref 8.4–10.5)
Creat: 0.96 mg/dL (ref 0.50–1.10)
GFR, Est African American: 69 mL/min
GFR, Est Non African American: 60 mL/min
Glucose, Bld: 107 mg/dL — ABNORMAL HIGH (ref 70–99)
POTASSIUM: 3.6 meq/L (ref 3.5–5.3)
SODIUM: 142 meq/L (ref 135–145)

## 2014-12-14 LAB — TSH: TSH: 1.284 u[IU]/mL (ref 0.350–4.500)

## 2014-12-14 NOTE — Patient Instructions (Signed)
Your physician recommends that you schedule a follow-up appointment in: 4 months with Dr. Branch   Your physician recommends that you continue on your current medications as directed. Please refer to the Current Medication list given to you today.  Thank you for choosing [facility]!!    

## 2014-12-14 NOTE — Progress Notes (Signed)
Clinical Summary Nicole Clements is a 74 y.o.female seen today for follow up of the following medical problems.   1. 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 Recent admit 07/2014 with CHF and troponin elevation - cath 07/2014, LM 30% distal, LAD mid 50%, LCX ostial 50% and mid 50%, small intermediate Daeron Carreno 70%. RCA diffuse 50%, distal RCA with ulcerated 90% lesion, PDA 80-90% too small for PCI. LVEF 30%. Received DES to distal RCA, post procedure the PDA became occluded.  - echo 07/2014 LVEF 20%, inferior akinesis, grade II diastolic dysfunction. Repeat echo 10/2014 LVEF remains 15-20%.  - she has seen Dr Graciela Husbands of EP and is being considered for an ICD, currently with lifevest  - no chest pain, no SOB. No LE edema, no orthopnea - limiting sodium intake. Avoiding NSAIDs. - compliant with meds    2. NSVT - noted on recent admission, she was discharged with a life vest which she is compliant with wearing - denies any palpitations - being evaluated for possible ICD  3.Mitral regurgitation - mild to moderate by echo - denies any significant SOB or LE edema  4. Hyperlipidemia - atorvastatin too expensive, she has not been able to get at home.  - compliant with pravastatin  daily  Past Medical History  Diagnosis Date  . Myocardial infarct 07/22/1996    st.elevated inferior wall infarct  . CAD (coronary artery disease) April 2014    RCA disease- Med Rx   . Hypertension   . Hypercholesterolemia   . S/P colonoscopy August 2007    hyperplastic polyps, rare sigmoid and descending colon diverticulosis, small internal hemorrhoids  . CHF (congestive heart failure) 12/31/2012  . Ischemic cardiomyopathy     EF 25%-45%  . PVC's (premature ventricular contractions)   . IVCD (intraventricular conduction defect)   . S/P coronary artery stent placement DES-PLA of RCA and occlusion of PDA 07/17/14 07/18/2014     Allergies  Allergen Reactions  . Sulfa Antibiotics Rash      Current Outpatient Prescriptions  Medication Sig Dispense Refill  . acetaminophen (TYLENOL) 325 MG tablet Take 2 tablets (650 mg total) by mouth every 4 (four) hours as needed for headache or mild pain.    [name] aspirin EC 81 MG EC tablet Take 1 tablet (81 mg total) by mouth daily.    . carvedilol (COREG) 25 MG tablet Take 1 tablet (25 mg total) by mouth 2 (two) times daily. 60 tablet 6  . Cholecalciferol (VITAMIN D3) 2000 UNITS capsule Take 2,000 Units by mouth daily.      . clopidogrel (PLAVIX) 75 MG tablet Take 1 tablet (75 mg total) by mouth daily with breakfast. 30 tablet 11  . furosemide (LASIX) 80 MG tablet Take 1 tablet (80 mg total) by mouth daily. 30 tablet 11  . losartan (COZAAR) 100 MG tablet Take 1 tablet (100 mg total) by mouth daily. 30 tablet 8  . nitroGLYCERIN (NITROSTAT) 0.4 MG SL tablet Place 1 tablet (0.4 mg total) under the tongue every 5 (five) minutes x 3 doses as needed for chest pain. 25 tablet 2  . Omega-3 Fatty Acids (FISH OIL) 1000 MG CAPS Take 1,000 mg by mouth daily.     . potassium chloride SA (KLOR-CON M20) 20 MEQ tablet Take 1 tablet (20 mEq total) by mouth daily. 90 tablet 3  . pravastatin (PRAVACHOL) 80 MG tablet Take 1 tablet (80 mg total) by mouth every evening. 90 tablet 3  . spironolactone (ALDACTONE) 25  MG tablet Take 12.5 mg by mouth 2 (two) times daily.     No current facility-administered medications for this visit.     Past Surgical History  Procedure Laterality Date  . Cardiac catheterization  April 2014    Med Rx  . S/p hysterectomy    . Left and right heart catheterization with coronary angiogram N/A 07/17/2014    Procedure: LEFT AND RIGHT HEART CATHETERIZATION WITH CORONARY ANGIOGRAM;  Surgeon: Micheline Chapman, MD;  Location: [facility] CATH LAB;  Service: Cardiovascular;  Laterality: N/A;  . Percutaneous coronary stent intervention (pci-s)  07/17/2014    Procedure: PERCUTANEOUS CORONARY STENT INTERVENTION (PCI-S);  Surgeon: Micheline Chapman, MD;   Location: [facility] CATH LAB;  Service: Cardiovascular;;  Distal RCA     Allergies  Allergen Reactions  . Sulfa Antibiotics Rash      Family History  Problem Relation Age of Onset  . Colon cancer Neg Hx   . Hypertension Sister   . Diabetes Mellitus II Sister   . Hypertension Brother   . Diabetes Mellitus II Brother   . Hypertension Brother   . Diabetes Mellitus II Brother   . Hypertension Brother   . Diabetes Mellitus II Brother      Social History Nicole Clements reports that she has been smoking Cigarettes.  She has a 25 pack-year smoking history. She has never used smokeless tobacco. Nicole Clements reports that she does not drink alcohol.   Review of Systems CONSTITUTIONAL: No weight loss, fever, chills, weakness or fatigue.  HEENT: Eyes: No visual loss, blurred vision, double vision or yellow sclerae.No hearing loss, sneezing, congestion, runny nose or sore throat.  SKIN: No rash or itching.  CARDIOVASCULAR: per HPI RESPIRATORY: No shortness of breath, cough or sputum.  GASTROINTESTINAL: No anorexia, nausea, vomiting or diarrhea. No abdominal pain or blood.  GENITOURINARY: No burning on urination, no polyuria NEUROLOGICAL: No headache, dizziness, syncope, paralysis, ataxia, numbness or tingling in the extremities. No change in bowel or bladder control.  MUSCULOSKELETAL: No muscle, back pain, joint pain or stiffness.  LYMPHATICS: No enlarged nodes. No history of splenectomy.  PSYCHIATRIC: No history of depression or anxiety.  ENDOCRINOLOGIC: No reports of sweating, cold or heat intolerance. No polyuria or polydipsia.  [name]   Physical Examination p 70 bp 126/98 Wt 167 lbs BMI 30 Gen: resting comfortably, no acute distress HEENT: no scleral icterus, pupils equal round and reactive, no palptable cervical adenopathy,  CV: RRR, no m/r/g, no JVD, no carotid bruits Resp: Clear to auscultation bilaterally GI: abdomen is soft, non-tender, non-distended, normal bowel sounds, no  hepatosplenomegaly MSK: extremities are warm, no edema.  Skin: warm, no rash Neuro:  no focal deficits Psych: appropriate affect   Diagnostic Studies 02/2013 Cath  Procedural Findings:  Hemodynamics:  AO 116/45 with a mean of 70  LV 120/18  Coronary angiography:  Coronary dominance: right  Left mainstem: The left main is patent with 30% distal left main stenosis as the vessel trifurcates into the LAD, intermediate Ameisha Mcclellan, and left circumflex.  Left anterior descending (LAD): The LAD is patent with diffuse disease noted. There is minimal calcification present. The first diagonal is patent with 30-50% diffuse disease. The LAD after the first septal perforator has 40-50% stenosis. The vessel reaches the apex and wraps around the left ventricular apex without significant stenosis.  Left circumflex (LCx): The intermediate Fritzi Scripter is small in caliber and diffusely diseased with 50-60% proximal vessel stenosis the AV groove circumflex is diffusely diseased with 50% ostial stenosis  and 30% mid stenosis it supplies 2 small posterolateral Kerney Hopfensperger  Right coronary artery (RCA): The RCA is diffusely diseased. There is significant tortuosity, especially around the junction of the mid and distal vessel. The vessel is diffusely calcified. The mid vessel has tandem 50% stenoses. The distal vessel has 80-90 % stenosis just before the bifurcation of the PDA and posterior AV segment. The PDA and posterior AV segment with 3 posterolateral branches are patent.  Left ventriculography: Left ventricular systolic function is moderately depressed. There is severe hypokinesis of the basal and midinferior walls. The anterolateral and apical walls contract normally. The left ventricular ejection fraction is estimated at 45%.  Abdominal aortogram: The abdominal aorta is patent. The renal arteries are single bilaterally and they are widely patent. There appears to be nonobstructive stenosis of the right common iliac  artery at the aortoiliac junction. The left common iliac artery appears to have significant ostial stenosis estimated at at least 75% angiographically. The visualized portions of the external iliac and common femoral arteries are patent  Final Conclusions:  1. Severe single-vessel coronary artery disease involving the distal right coronary artery  2. Moderate LV dysfunction with segmental severe hypokinesis of the inferior wall and normal contraction of the antero-apex with estimated left ventricular ejection fraction of 45%.  3. Severe left common iliac artery stenosis  Recommendations: The patient has severe distal RCA disease. She has a known history of inferior wall infarction and remote PCA. This fits with that clinical history. She has nonobstructive disease of the left coronary artery. Her left ventricular ejection fraction by ventriculography is clearly greater than 35%. With her history of previous MI, I do not think PCI is indicated with an absence of angina. Will carefully discussed symptoms with the patient, but I am inclined to treat her medically. Will also review indication for consideration of peripheral intervention for her iliac disease.    01/2013 Echo  Study Conclusions  - Left ventricle: The cavity size was mildly to moderately dilated. Wall thickness was normal. Systolic function was severely reduced. The estimated ejection fraction was 25%. Severe diffuse hypokinesis. - Aortic valve: Mildly calcified annulus. Trileaflet. - Mitral valve: Calcified annulus. - Left atrium: The atrium was moderately dilated. - Right ventricle: The cavity size was normal. Wall thickness was mildly to moderately increased. - Right atrium: The atrium was mildly dilated. - Atrial septum: No defect or patent foramen ovale was identified. Impressions:  - Compared to the prior study of 03/13/11, there has been substantial deterioration in LV systolic function.   07/2014 Echo Study  Conclusions  - Left ventricle: The cavity size was severely dilated. Wall thickness was normal. The estimated ejection fraction was 20%. Diffuse hypokinesis. There is akinesis of the inferolateral and inferior myocardium. Features are consistent with a pseudonormal left ventricular filling pattern, with concomitant abnormal relaxation and increased filling pressure (grade 2 diastolic dysfunction). Doppler parameters are consistent with high ventricular filling pressure. - Mitral valve: There was mild to moderate regurgitation. - Left atrium: The atrium was moderately dilated.  Impressions:  - Global hypokinesis; inferior and inferolateral akinesis; overall severely reduced LV function; grade 2 diastolic dysfunction with elevated left ventricular filling pressures, moderate LAE; mild to moderate MR.  07/2014 Cath PROCEDURAL FINDINGS  Hemodynamics:  AO 122/48  LV 118/14  RA 9  RV 41/11  PA 37/5 with a mean of 21  PCWP 27  Oxygen saturations  AO 90  PA 56  Cardiac output 3.5  Cardiac index 2.0  Coronary  angiography:  Coronary dominance: right  Left mainstem: The left mainstem is patent with 30% distal left main stenosis.  Left anterior descending (LAD): The LAD is diffusely diseased in the midportion. There are sequential 50% stenoses, unchanged from previous study. The first diagonal Priti Consoli is large in distribution with diffuse nonobstructive disease noted. The mid and distal LAD are patent without significant disease.  Left circumflex (LCx): The left circumflex has 50% ostial stenosis, 50% mid vessel stenosis, and to OM branches with no significant disease. There is an intermediate Zakeria Kulzer is diffusely diseased with up to 70% stenosis. This vessel is small in caliber.  Right coronary artery (RCA): Dominant vessel. Tortuous with diffuse nonobstructive stenosis throughout the proximal and mid portions. There are diffuse 50% stenosis present. The distal vessel has  an ulcerated-appearing irregular 90% stenosis involving the bifurcation of the PDA and posterolateral branches. The posterolateral branches are patent with mild diffuse disease. The PDA arises an acute angulation with 30-40% stenosis. The mid body of the PDA has an 80-90% stenosis in an area where the vessel is too small for PCI.  Left ventriculography: The ventricle appears the synchronous. The basal and midinferior wall are akinetic. The basal inferolateral wall is severely hypokinetic. The anterolateral wall and apex contract normally. The estimated LVEF is 30%.  PCI Procedure Note: Following the diagnostic procedure, the decision was made to proceed with PCI of the distal RCA. The lesion is complex, there is a large amount of myocardium involved. I felt there was a risk of potentially compromising the PDA Kohana Amble, but overall in my opinion the risk/benefit was favorable for PCI considering the severe stenosis involving the entire RCA distribution. The sheath was upsized to a 6 [place]. Weight-based bivalirudin was given for anticoagulation. The patient was loaded with Plavix 600 mg on the table. Once a therapeutic ACT was achieved, a 6 [place] JR 4 guide catheter was inserted. A cougar coronary guidewire was used to cross the lesion into the PLA Dace Denn. The lesion was predilated with a 2.5 x 15 mm balloon. The lesion was then stented with a 3.0 by 24mm Promus DES stent. The stent was postdilated with a 3.25 mm noncompliant balloon to 18 atmospheres. The PDA was stented across as the stent extended from the distal RCA proximal PLA Yazleemar Strassner. The PDA was totally occluded. I made a long attempt with a whisper wire using multiple bands on the tip of the wire, but all of these attempts were unsuccessful at rewiring the PDA. The patient was completely asymptomatic and specifically denied any chest pain. Her hemodynamics remained stable and there were no changes in her rhythm or blood pressure. A final image of the left  coronary artery was obtained and this demonstrated a collateral from the LAD to the PDA Remmy Riffe. Following PCI, there was 0% residual stenosis and TIMI-3 flow. Final angiography confirmed an excellent result. Femoral hemostasis was achieved will be achieved with manual hemostasis. The patient tolerated the PCI procedure well. There were no immediate procedural complications. The patient was transferred to the post catheterization recovery area for further monitoring.  PCI Data:  Vessel - RCA/Segment - distal  Percent Stenosis (pre) 90  TIMI-flow 3  Stent 3.0 by 31mm Promus DES  Percent Stenosis (post) 0  TIMI-flow (post) 3  Contrast: 175 cc Omnipaque  Radiation dose/Fluoro time: 15.5 minutes  Estimated Blood Loss: Minimal  Final Conclusions:  1. Nonobstructive disease involving the LAD, left circumflex, and OM/diagonal side branches  2. Severe stenosis of the distal  RCA bifurcation, treated successfully with a drug-eluting stent into the PLA Zaylynn Rickett with residual occlusion of the PDA Ulys Favia  3. Severe segmental LV systolic dysfunction  Recommendations: Dual antiplatelet therapy with aspirin and Plavix for at least 12 months. Will cycle cardiac enzymes since the PDA Ever Halberg is occluded after stenting.  10/2014 Echo Study Conclusions  - Procedure narrative: Transthoracic echocardiography. Image quality was suboptimal. The study was technically difficult, as a result of poor sound wave transmission and body habitus. - Left ventricle: Systolic function is severely reduced, estimated EF 15-20%. The cavity size was severely dilated. Doppler parameters are consistent with abnormal left ventricular relaxation (grade 1 diastolic dysfunction). Doppler parameters are consistent with high ventricular filling pressure. Medial E/e&' 24. - Regional wall motion abnormality: Akinesis of the basal-mid inferior and basal inferolateral myocardium; severe hypokinesis of the  apical inferior and mid inferolateral myocardium. Severe, diffuse hypokinesis. - Ventricular septum: Septal motion showed abnormal function and dyssynergy. These changes are consistent with intraventricular conduction delay. - Aortic valve: Mildly calcified annulus. Trileaflet; mildly thickened leaflets. - Mitral valve: Mildly dilated annulus. Mildly thickened leaflets . There was moderate eccentric regurgitation. 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 - Left atrium: The atrium was severely dilated. Volume/bsa, S: 47.1 ml/m^2. - Right ventricle: Systolic function was mildly reduced. - Right atrium: The atrium was mildly dilated. - Atrial septum: There was increased thickness of the septum, consistent with lipomatous hypertrophy. - Tricuspid valve: There was mild regurgitation. - Pulmonary arteries: PA peak pressure: 42 mm Hg (S). Mildly elevated pulmonary pressures. - Pericardium, extracardiac: There was a small pericardial effusion, with no evidence of tamponade physiology.      Assessment and Plan  1. CAD - no current symptoms, continue DAPT at least until 07/2015 - continue risk factor modification and secondary prevention  2. Chronic systolic HF - echo 07/2014 LVEF 20%, NYHA II, currently being evaluated for ICD, has life vest - on optimal medical therapy, continue current meds  3. NSVT - Continue lifevest, continue beta blocker - being evaluted for IVD  4. Mitral regurgitation Mild to moderate by last echo, continue to follow clinically.  5. Hyperlipidemia - options limited by cost, continue pravastatin  daily   F/u 4 months    Antoine Poche, M.D.

## 2014-12-15 ENCOUNTER — Telehealth: Payer: Self-pay | Admitting: Internal Medicine

## 2014-12-15 NOTE — Telephone Encounter (Signed)
Informed patient that monitor was printed today for Dr Graciela Husbands to review.  She understands he will review Tuesday and we will call her. Reviewed procedure instructions with patient and advised we will talk again Tuesday.  She is agreeable.

## 2014-12-15 NOTE — Telephone Encounter (Signed)
New Msg        Pt states she wasn't aware that defib procedure was still scheduled.   Pt states she needs instructions on what to do before 12/20/14.   Please return call.

## 2014-12-19 ENCOUNTER — Telehealth: Payer: Self-pay | Admitting: Internal Medicine

## 2014-12-19 DIAGNOSIS — I255 Ischemic cardiomyopathy: Secondary | ICD-10-CM

## 2014-12-19 DIAGNOSIS — I493 Ventricular premature depolarization: Secondary | ICD-10-CM

## 2014-12-19 NOTE — Telephone Encounter (Signed)
Informed patient cancelled procedure for tomorrow.  We try medication change.  She and I agreed to speak tomorrow to review new instructions.

## 2014-12-19 NOTE — Telephone Encounter (Signed)
New message     Pt said Nicole Clements was to call her today regarding her defibulator procedure tomorrow.

## 2014-12-20 ENCOUNTER — Encounter (HOSPITAL_COMMUNITY): Admission: RE | Payer: Self-pay | Source: Ambulatory Visit

## 2014-12-20 ENCOUNTER — Ambulatory Visit (HOSPITAL_COMMUNITY): Admission: RE | Admit: 2014-12-20 | Payer: Medicare Other | Source: Ambulatory Visit | Admitting: Internal Medicine

## 2014-12-20 SURGERY — IMPLANTABLE CARDIOVERTER DEFIBRILLATOR IMPLANT
Anesthesia: LOCAL

## 2014-12-20 MED ORDER — AMIODARONE HCL 200 MG PO TABS: ORAL_TABLET | ORAL | Status: DC

## 2014-12-20 MED ORDER — AMIODARONE HCL 200 MG PO TABS: 200.0000 mg | ORAL_TABLET | Freq: Every day | ORAL | Status: DC

## 2014-12-20 NOTE — Telephone Encounter (Signed)
Per Dr. Graciela Husbands:   Start Amiodarone.  Take 400 mg BID x 2 weeks, then reduce to 400 mg daily x 2 weeks, then reduce to daily dosage of 200 mg. Repeat holter monitor and echo in 6-7 weeks. Follow up with Dr. Graciela Husbands post repeat testing.  Reviewed these instructions with patient.  Rx sent to [facility] [address] Patient aware that office will call her to arrange testing and follow up. Patient verbalized understanding and agreeable to plan.

## 2015-01-31 ENCOUNTER — Ambulatory Visit (HOSPITAL_COMMUNITY): Payer: Medicare Other | Attending: Cardiovascular Disease | Admitting: Radiology

## 2015-01-31 ENCOUNTER — Encounter: Payer: Self-pay | Admitting: *Deleted

## 2015-01-31 ENCOUNTER — Encounter (INDEPENDENT_AMBULATORY_CARE_PROVIDER_SITE_OTHER): Payer: Medicare Other

## 2015-01-31 DIAGNOSIS — I255 Ischemic cardiomyopathy: Secondary | ICD-10-CM | POA: Diagnosis not present

## 2015-01-31 DIAGNOSIS — I493 Ventricular premature depolarization: Secondary | ICD-10-CM

## 2015-01-31 NOTE — Progress Notes (Signed)
Patient ID: Nicole Clements, female   DOB: 22-Apr-1941, 74 y.o.   MRN: [id number] [facility] 24 hour holter monitor applied to patient.

## 2015-01-31 NOTE — Progress Notes (Signed)
Echocardiogram performed.  

## 2015-02-06 ENCOUNTER — Ambulatory Visit: Payer: PRIVATE HEALTH INSURANCE | Admitting: Internal Medicine

## 2015-02-06 DIAGNOSIS — H2513 Age-related nuclear cataract, bilateral: Secondary | ICD-10-CM | POA: Diagnosis not present

## 2015-02-19 DIAGNOSIS — R809 Proteinuria, unspecified: Secondary | ICD-10-CM | POA: Diagnosis not present

## 2015-03-09 ENCOUNTER — Telehealth: Payer: Self-pay | Admitting: *Deleted

## 2015-03-09 NOTE — Telephone Encounter (Signed)
Informed patient monitor findings showed PVC burden at 3%, which is too low to cause cardiomyopathy. Dr. Graciela Husbands recommends ICD. We agreed to speak next week to arrange procedure. Patient is agreeable to plan.

## 2015-03-14 ENCOUNTER — Other Ambulatory Visit: Payer: Self-pay | Admitting: Adult Health

## 2015-03-15 NOTE — Telephone Encounter (Signed)
Left message that I would call next week to arrange procedure.

## 2015-03-29 ENCOUNTER — Encounter: Payer: Self-pay | Admitting: Internal Medicine

## 2015-03-29 ENCOUNTER — Ambulatory Visit (INDEPENDENT_AMBULATORY_CARE_PROVIDER_SITE_OTHER): Payer: Medicare Other | Admitting: Internal Medicine

## 2015-03-29 ENCOUNTER — Encounter: Payer: Self-pay | Admitting: *Deleted

## 2015-03-29 VITALS — BP 102/70 | HR 40 | Ht 62.5 in | Wt 159.2 lb

## 2015-03-29 DIAGNOSIS — I255 Ischemic cardiomyopathy: Secondary | ICD-10-CM | POA: Diagnosis not present

## 2015-03-29 DIAGNOSIS — I429 Cardiomyopathy, unspecified: Secondary | ICD-10-CM

## 2015-03-29 DIAGNOSIS — Z01812 Encounter for preprocedural laboratory examination: Secondary | ICD-10-CM | POA: Diagnosis not present

## 2015-03-29 DIAGNOSIS — I5022 Chronic systolic (congestive) heart failure: Secondary | ICD-10-CM

## 2015-03-29 MED ORDER — CARVEDILOL 25 MG PO TABS: 12.5000 mg | ORAL_TABLET | Freq: Two times a day (BID) | ORAL | Status: DC

## 2015-03-29 NOTE — Patient Instructions (Addendum)
Medication Instructions:  Your physician has recommended you make the following change in your medication:  1) DECREASE Carvedilol to 12.5 mg twice daily  Labwork: Pre procedure labs to be done on: 04/11/15  Testing/Procedures: Your physician has recommended that you have a defibrillator inserted. An implantable cardioverter defibrillator (ICD) is a small device that is placed in your chest or, in rare cases, your abdomen. This device uses electrical pulses or shocks to help control life-threatening, irregular heartbeats that could lead the heart to suddenly stop beating (sudden cardiac arrest). Leads are attached to the ICD that goes into your heart. This is done in the hospital and usually requires an overnight stay. Please see the instruction sheet given to you today for more information.  CRT or cardiac resynchronization therapy is a treatment used to correct heart failure. When you have heart failure your heart is weakened and doesn't pump as well as it should. This therapy may help reduce symptoms and improve the quality of life.  Please see the handout/brochure given to you today to get more information of the different options of therapy.  Follow-Up: Your wound check is scheduled for 04/30/15 at 2:00 p.m. at [address]    Thank you for choosing [facility]!!     Cardioverter Defibrillator Implantation An implantable cardioverter defibrillator (ICD) is a small, lightweight, battery-powered device that is placed (implanted) under the skin in the chest or abdomen. Your caregiver may prescribe an ICD if:  You have had an irregular heart rhythm (arrhythmia) that originated in the lower chambers of the heart (ventricles).  Your heart has been damaged by a disease (such as coronary artery disease) or heart condition (such as a heart attack). An ICD consists of a battery that lasts several years, a small computer called a pulse generator, and wires called leads that  go into the heart. It is used to detect and correct two dangerous arrhythmias: a rapid heart rhythm (tachycardia) and an arrhythmia in which the ventricles contract in an uncoordinated way (fibrillation). When an ICD detects tachycardia, it sends an electrical signal to the heart that restores the heartbeat to normal (cardioversion). This signal is usually painless. If cardioversion does not work or if the ICD detects fibrillation, it delivers a small electrical shock to the heart (defibrillation) to restart the heart. The shock may feel like a strong jolt in the chest.ICDs may be programmed to correct other problems. Sometimes, ICDs are programmed to act as another type of implantable device called a pacemaker. Pacemakers are used to treat a slow heartbeat (bradycardia). LET YOUR CAREGIVER KNOW ABOUT:  Any allergies you have.  All medicines you are taking, including vitamins, herbs, eyedrops, and over-the-counter medicines and creams.  Previous problems you or members of your family have had with the use of anesthetics.  Any blood disorders you have had.  Other health problems you have. RISKS AND COMPLICATIONS Generally, the procedure to implant an ICD is safe. However, as with any surgical procedure, complications can occur. Possible complications associated with implanting an ICD include:  Swelling, bleeding, or bruising at the site where the ICD was implanted.  Infection at the site where the ICD was implanted.  A reaction to medicine used during the procedure.  Nerve, heart, or blood vessel damage.  Blood clots. BEFORE THE PROCEDURE  You may need to have blood tests, heart tests, or a chest X-ray done before the day of the procedure.  Ask your caregiver about changing or stopping your regular  medicines.  Make plans to have someone drive you home. You may need to stay in the hospital overnight after the procedure.  Stop smoking at least 24 hours before the procedure.  Take a  bath or shower the night before the procedure. You may need to scrub your chest or abdomen with a special type of soap.  Do not eat or drink before your procedure for as long as directed by your caregiver. Ask if it is okay to take any needed medicine with a small sip of water. PROCEDURE  The procedure to implant an ICD in your chest or abdomen is usually done at a hospital in a room that has a large X-ray machine called a fluoroscope. The machine will be above you during the procedure. It will help your caregiver see your heart during the procedure. Implanting an ICD usually takes 1-3 hours. Before the procedure:   Small monitors will be put on your body. They will be used to check your heart, blood pressure, and oxygen level.  A needle will be put into a vein in your hand or arm. This is called an intravenous (IV) access tube. Fluids and medicine will flow directly into your body through the IV tube.  Your chest or abdomen will be cleaned with a germ-killing (antiseptic) solution. The area may be shaved.  You may be given medicine to help you relax (sedative).  You will be given a medicine called a local anesthetic. This medicine will make the surgical site numb while the ICD is implanted. You will be sleepy but awake during the procedure. After you are numb the procedure will begin. The caregiver will:  Make a small cut (incision). This will make a pocket deep under your skin that will hold the pulse generator.  Guide the leads through a large blood vessel into your heart and attach them to the heart muscles. Depending on the ICD, the leads may go into one ventricle or they may go to both ventricles and into an upper chamber of the heart (atrium).  Test the ICD.  Close the incision with stitches, glue, or staples. AFTER THE PROCEDURE  You may feel pain. Some pain is normal. It may last a few days.  You may stay in a recovery area until the local anesthetic has worn off. Your blood  pressure and pulse will be checked often. You will be taken to a room where your heart will be monitored.  A chest X-ray will be taken. This is done to check that the cardioverter defibrillator is in the right place.  You may stay in the hospital overnight.  A slight bump may be seen over the skin where the ICD was placed. Sometimes, it is possible to feel the ICD under the skin. This is normal.  In the months and years afterward, your caregiver will check the device, the leads, and the battery every few months. Eventually, when the battery is low, the ICD will be replaced. Document Released: 07/12/2002 Document Revised: 08/10/2013 Document Reviewed: 11/08/2012 [facility] Patient Information 2015 [place], [place]. This information is not intended to replace advice given to you by your health care provider. Make sure you discuss any questions you have with your health care provider.   Cardioverter Defibrillator Implantation, Care After Refer to this sheet in the next few weeks. These instructions provide you with information on caring for yourself after your procedure. Your health care provider may also give you more specific instructions. Your treatment has been planned according  to current medical practices, but problems sometimes occur. Call your health care provider if you have any problems or questions after your procedure.  WHAT TO EXPECT AFTER THE PROCEDURE  You may feel pain. Some pain is normal. It may last a few days.  A slight bump may be seen over the skin where the device was placed. Sometimes, it is possible to feel the device under the skin. This is normal.  In the months and years afterward, your health care provider will check the device, the leads, and the battery every few months. Eventually, when the battery is low, the device will be replaced. HOME CARE INSTRUCTIONS  Medicines  Take medicines only as directed by your health care provider.  If you were prescribed an  antibiotic medicine, finish it all even if you start to feel better.   Do not take any other medicines without asking your health care provider first. Some medicines, including certain painkillers, can cause bleeding after surgery.  Wound Care  Do not remove the bandage on your chest until directed to do so by your health care provider.  Once your bandage is removed, you may see pieces of tape called skin adhesive strips over the area where the cut was made (incision site). Let them fall off on their own.   Check the incision site every day to make sure it is not infected, bleeding, or starting to pull apart.  Do not use lotions or ointments near the incision site unless directed to do so.   Keep the incision area clean and dry for 2-3 days after the procedure or as directed by your health care provider. It takes several weeks for the incision site to completely heal.   Do not take baths, swim, or use a hot tub until your health care provider approves. Activities  Try to walk a little every day. Exercising is important after this procedure. It is also important to use your shoulder on the side of the pacemaker in daily tasks that do not require exaggerated motion.  Avoid sudden jerking, pulling, or chopping movements that pull your upper arm far away from your body for at least 6 weeks.  Do not lift your upper arm above your shoulders for at least 6 weeks. This means no tennis, golf, or swimming for this period of time. If you sleep with the arm above your head, use a restraint to prevent this from happening as you sleep.  You may go back to work when your health care provider says it is okay. Check with your health care provider before you start to drive or play sports.  Other Instructions  Follow diet instructions if they were provided. You should be able to eat what you usually do right away, but you may need to limit your salt intake.   Weigh yourself every day. If you  suddenly gain weight, fluid may be building up in your body.   Always carry your pacemaker identification card with you. The card should list the implant date, device model, and manufacturer. Consider wearing a medical alert bracelet or necklace.  Tell all health care providers that you have a pacemaker. This may prevent them from giving you a magnetic resonance imaging (MRI) scan because of the strong magnets used during that test.  If you must pass through a metal detector, quickly walk through it. Do not stop under the detector or stand near it.  Avoid places or objects with a strong electric or magnetic field, including:  [employer]. When at the airport, let officials know you have a pacemaker. Your ID card will let you be checked in a way that is safe for you and that will not damage your pacemaker. Also, do not let a security person wave a magnetic wand near your pacemaker. That can make it stop working.  Power plants.   Large electrical generators.   Radiofrequency transmission towers, such as cell phone and radio towers.   Do not use amateur (ham) radio equipment or electric (arc) welding torches. Some devices are safe to use if held at least 1 foot from your pacemaker. These include power tools, lawn mowers, and speakers. If you are unsure of whether something is safe to use, ask your health care provider.   You may safely use electric blankets, heating pads, computers, and microwave ovens.   When using your cell phone, hold it to the ear opposite the pacemaker. Do not leave your cell phone in a pocket over the pacemaker.   Keep all follow-up visits as directed by your health care provider. This is how your health care provider makes sure your chest is healing the way it should. Ask your health care provider when you should come back to have your stitches or staples taken out.   Have your pacemaker checked every 3-6 months or as directed by your health care  provider. Most pacemakers last for 4-8 years before a new one is needed. SEEK MEDICAL CARE IF:   You feel one shock in your chest.  You gain weight suddenly.   Your legs or feet swell more than they have before.   It feels like your heart is fluttering or skipping beats (heart palpitations).  You have a fever. SEEK IMMEDIATE MEDICAL CARE IF:   You have chest pain.  You feel more than one shock.  You feel more short of breath than you have felt before.  You feel more light-headed than you have felt before.  You have problems with your incision site, such as swelling or bleeding, or it starts to open up.   You notice signs of infection around your incision site. Watch for:   Warmth.   Redness.   Worsening pain.   Swelling.   Fluid leaking from the incision site.  Document Released: 05/09/2005 Document Revised: 03/06/2014 Document Reviewed: 11/10/2012 [facility] Patient Information 2015 [place], [place]. This information is not intended to replace advice given to you by your health care provider. Make sure you discuss any questions you have with your health care provider.

## 2015-03-29 NOTE — Progress Notes (Signed)
Patient Care Team: Lupita Raider, MD as PCP - General (Family Medicine) Gretta Cool, MD (Obstetrics and Gynecology) Darden Amber, OD as Referring Physician (Optometry) West Bali, MD (Gastroenterology) Gaylord Shih, MD as Consulting Physician (Cardiology)   HPI  Nicole Clements is a 74 y.o. female Seen in follow-up for left ventricular dysfunction. She has a history of ischemic heart disease with prior stenting of the RCA and RPDA.  Ejection fraction most recently 12/15 was 15-20%. Catheterization 9/15 demonstrated no obstructive disease and LV EF of about 30%.  She has history of frequent ventricular ectopy. Prior to proceeding with device implantation during evaluation in February, we undertook a Holter monitor which demonstrated 10% PVCs and we began  Antiarrhythmic trial with amiodarone with a significant reduction in PVCs--3%. She had a subsequent echo 3/16 demonstrating no interval improvement.  She continues with modest exercise intolerance. She has no peripheral edema. She's had no syncope.  Past Medical History  Diagnosis Date  . Myocardial infarct 07/22/1996    st.elevated inferior wall infarct  . CAD (coronary artery disease) April 2014    RCA disease- Med Rx   . Hypertension   . Hypercholesterolemia   . S/P colonoscopy August 2007    hyperplastic polyps, rare sigmoid and descending colon diverticulosis, small internal hemorrhoids  . CHF (congestive heart failure) 12/31/2012  . Ischemic cardiomyopathy     EF 25%-45%  . PVC's (premature ventricular contractions)   . IVCD (intraventricular conduction defect)   . S/P coronary artery stent placement DES-PLA of RCA and occlusion of PDA 07/17/14 07/18/2014    Past Surgical History  Procedure Laterality Date  . Cardiac catheterization  April 2014    Med Rx  . S/p hysterectomy    . Left and right heart catheterization with coronary angiogram N/A 07/17/2014    Procedure: LEFT AND RIGHT HEART CATHETERIZATION  WITH CORONARY ANGIOGRAM;  Surgeon: Micheline Chapman, MD;  Location: [facility] CATH LAB;  Service: Cardiovascular;  Laterality: N/A;  . Percutaneous coronary stent intervention (pci-s)  07/17/2014    Procedure: PERCUTANEOUS CORONARY STENT INTERVENTION (PCI-S);  Surgeon: Micheline Chapman, MD;  Location: [facility] CATH LAB;  Service: Cardiovascular;;  Distal RCA    Current Outpatient Prescriptions  Medication Sig Dispense Refill  . acetaminophen (TYLENOL) 325 MG tablet Take 2 tablets (650 mg total) by mouth every 4 (four) hours as needed for headache or mild pain.    [name] amiodarone (PACERONE) 200 MG tablet Take 1 tablet (200 mg total) by mouth daily. 30 tablet 3  . aspirin EC 81 MG EC tablet Take 1 tablet (81 mg total) by mouth daily.    . carvedilol (COREG) 25 MG tablet take 1 tablet by mouth twice a day 60 tablet 6  . Cholecalciferol (VITAMIN D3) 2000 UNITS capsule Take 2,000 Units by mouth daily.      . clopidogrel (PLAVIX) 75 MG tablet Take 1 tablet (75 mg total) by mouth daily with breakfast. 30 tablet 11  . furosemide (LASIX) 80 MG tablet Take 1 tablet (80 mg total) by mouth daily. 30 tablet 11  . losartan (COZAAR) 100 MG tablet Take 1 tablet (100 mg total) by mouth daily. 30 tablet 8  . nitroGLYCERIN (NITROSTAT) 0.4 MG SL tablet Place 1 tablet (0.4 mg total) under the tongue every 5 (five) minutes x 3 doses as needed for chest pain. 25 tablet 2  . Omega-3 Fatty Acids (FISH OIL) 1000 MG CAPS Take 1,000 mg by mouth daily.     [name]  potassium chloride SA (KLOR-CON M20) 20 MEQ tablet Take 1 tablet (20 mEq total) by mouth daily. 90 tablet 3  . pravastatin (PRAVACHOL) 80 MG tablet Take 1 tablet (80 mg total) by mouth every evening. 90 tablet 3  . spironolactone (ALDACTONE) 25 MG tablet Take 12.5 mg by mouth 2 (two) times daily.     No current facility-administered medications for this visit.    Allergies  Allergen Reactions  . Sulfa Antibiotics Rash    Review of Systems negative except from HPI and  PMH  Physical Exam BP 102/70 mmHg  Pulse 40  Ht 5' 2.5" (1.588 m)  Wt 159 lb 3.2 oz (72.213 kg)  BMI 28.64 kg/m2 Well developed and well nourished in no acute distress HENT normal E scleral and icterus clear Neck Supple JVP flat; carotids brisk and full Clear to ausculation  Regular rate and rhythm, no murmurs gallops or rub Soft with active bowel sounds No clubbing cyanosis  Edema Alert and oriented, grossly normal motor and sensory function Skin Warm and Dry  Electrocardiogram was ordered today and this demonstrated sinus rhythm at 40 interval 17/60/54 Left bundle branch block.   Ischemic//nonischemic Cardiomyopathy  CHF chronic Systolic  Ventricular Ectopy//PVCs and VT-Nonsustained  Left bundle branch block  Sinus bradycardia    She has persistent cardiomyopathy not withstanding antiarrhythmic  suppression of ventricular ectopy.   Thus no reversible cause for her cardiomyopathy is identified   She has congestive failure albeit mild in the setting of combined ischemic and nonischemic cardiomyopathy. She has a very broad left bundle and hence is an appropriately considered for CRT-D implantation for both reduction in risk of sudden death as well as mitigation of symptoms and progression of heart failure.  Her sinus bradycardia is impressive. We will decrease her carvedilol for right now 25--12.5 with plans to resume following device implantation  Have reviewed the potential benefits and risks of ICD implantation including but not limited to death, perforation of heart or lung, lead dislodgement, infection,  device malfunction and inappropriate shocks.  The patient and family express understanding  and are willing to proceed.

## 2015-04-06 ENCOUNTER — Encounter: Payer: Self-pay | Admitting: Cardiology

## 2015-04-06 ENCOUNTER — Ambulatory Visit (INDEPENDENT_AMBULATORY_CARE_PROVIDER_SITE_OTHER): Payer: Medicare Other | Admitting: Cardiology

## 2015-04-06 VITALS — BP 124/62 | HR 52 | Ht 63.0 in | Wt 159.0 lb

## 2015-04-06 DIAGNOSIS — I255 Ischemic cardiomyopathy: Secondary | ICD-10-CM

## 2015-04-06 DIAGNOSIS — I5022 Chronic systolic (congestive) heart failure: Secondary | ICD-10-CM

## 2015-04-06 DIAGNOSIS — R0989 Other specified symptoms and signs involving the circulatory and respiratory systems: Secondary | ICD-10-CM | POA: Diagnosis not present

## 2015-04-06 DIAGNOSIS — I472 Ventricular tachycardia: Secondary | ICD-10-CM

## 2015-04-06 DIAGNOSIS — I2510 Atherosclerotic heart disease of native coronary artery without angina pectoris: Secondary | ICD-10-CM

## 2015-04-06 DIAGNOSIS — I4729 Other ventricular tachycardia: Secondary | ICD-10-CM

## 2015-04-06 MED ORDER — LOSARTAN POTASSIUM 25 MG PO TABS: 25.0000 mg | ORAL_TABLET | Freq: Every day | ORAL | Status: DC

## 2015-04-06 NOTE — Patient Instructions (Addendum)
Your physician recommends that you schedule a follow-up appointment in: 1 month with Dr. Wyline Mood  Decrease losartan to 25 mg daily   Your physician has requested that you have a carotid duplex. This test is an ultrasound of the carotid arteries in your neck. It looks at blood flow through these arteries that supply the brain with blood. Allow one hour for this exam. There are no restrictions or special instructions.  Thanks for choosing [facility]!!!

## 2015-04-06 NOTE — Progress Notes (Signed)
Clinical Summary Ms. Dellis is a 74 y.o.female seen today for follow up of the following medical problems  1. 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 Recent admit 07/2014 with CHF and troponin elevation - cath 07/2014, LM 30% distal, LAD mid 50%, LCX ostial 50% and mid 50%, small intermediate branch 70%. RCA diffuse 50%, distal RCA with ulcerated 90% lesion, PDA 80-90% too small for PCI. LVEF 30%. Received DES to distal RCA, post procedure the PDA became occluded.  - echo 07/2014 LVEF 20%, inferior akinesis, grade II diastolic dysfunction. Repeat echo 10/2014 LVEF remains 15-20%.  - she has seen Dr Graciela Husbands of EP and is scheduled for ICD placement  - no chest pain, no SOB. No LE edema, no orthopnea - limiting sodium intake. Avoiding NSAIDs. - compliant with meds. Dr Graciela Husbands decreased coreg 12.5mg  bid due to bradycardia. She stopped losartan on her own due to dizziness.  - no LE edema, no SOB or DOE. - weights at home stable 156-158.     2. NSVT - noted on recent admission, she was discharged with a life vest which she is compliant with wearing - denies any palpitations - scheduled for ICD placement  3.Mitral regurgitation - mild to moderate by echo - denies any significant SOB or LE edema  4. Hyperlipidemia - atorvastatin too expensive, she has not been able to get at home.  - compliant with pravastatin 80mg  daily Past Medical History  Diagnosis Date  . Myocardial infarct 07/22/1996    st.elevated inferior wall infarct  . CAD (coronary artery disease) April 2014    RCA disease- Med Rx   . Hypertension   . Hypercholesterolemia   . S/P colonoscopy August 2007    hyperplastic polyps, rare sigmoid and descending colon diverticulosis, small internal hemorrhoids  . CHF (congestive heart failure) 12/31/2012  . Ischemic cardiomyopathy     EF 25%-45%  . PVC's (premature ventricular contractions)   . IVCD (intraventricular conduction defect)   . S/P coronary artery  stent placement DES-PLA of RCA and occlusion of PDA 07/17/14 07/18/2014     Allergies  Allergen Reactions  . Sulfa Antibiotics Rash     Current Outpatient Prescriptions  Medication Sig Dispense Refill  . acetaminophen (TYLENOL) 325 MG tablet Take 2 tablets (650 mg total) by mouth every 4 (four) hours as needed for headache or mild pain.    [name] amiodarone (PACERONE) 200 MG tablet Take 1 tablet (200 mg total) by mouth daily. 30 tablet 3  . aspirin EC 81 MG EC tablet Take 1 tablet (81 mg total) by mouth daily.    . carvedilol (COREG) 25 MG tablet Take 0.5 tablets (12.5 mg total) by mouth 2 (two) times daily. 30 tablet 6  . Cholecalciferol (VITAMIN D3) 2000 UNITS capsule Take 2,000 Units by mouth daily.      . clopidogrel (PLAVIX) 75 MG tablet Take 1 tablet (75 mg total) by mouth daily with breakfast. 30 tablet 11  . furosemide (LASIX) 80 MG tablet Take 1 tablet (80 mg total) by mouth daily. 30 tablet 11  . losartan (COZAAR) 100 MG tablet Take 1 tablet (100 mg total) by mouth daily. 30 tablet 8  . nitroGLYCERIN (NITROSTAT) 0.4 MG SL tablet Place 1 tablet (0.4 mg total) under the tongue every 5 (five) minutes x 3 doses as needed for chest pain. 25 tablet 2  . Omega-3 Fatty Acids (FISH OIL) 1000 MG CAPS Take 1,000 mg by mouth daily.     [name]  potassium chloride SA (KLOR-CON M20) 20 MEQ tablet Take 1 tablet (20 mEq total) by mouth daily. 90 tablet 3  . pravastatin (PRAVACHOL) 80 MG tablet Take 1 tablet (80 mg total) by mouth every evening. 90 tablet 3  . spironolactone (ALDACTONE) 25 MG tablet Take 12.5 mg by mouth 2 (two) times daily.     No current facility-administered medications for this visit.     Past Surgical History  Procedure Laterality Date  . Cardiac catheterization  April 2014    Med Rx  . S/p hysterectomy    . Left and right heart catheterization with coronary angiogram N/A 07/17/2014    Procedure: LEFT AND RIGHT HEART CATHETERIZATION WITH CORONARY ANGIOGRAM;  Surgeon: Micheline Chapman, MD;  Location: [facility] CATH LAB;  Service: Cardiovascular;  Laterality: N/A;  . Percutaneous coronary stent intervention (pci-s)  07/17/2014    Procedure: PERCUTANEOUS CORONARY STENT INTERVENTION (PCI-S);  Surgeon: Micheline Chapman, MD;  Location: [facility] CATH LAB;  Service: Cardiovascular;;  Distal RCA     Allergies  Allergen Reactions  . Sulfa Antibiotics Rash      Family History  Problem Relation Age of Onset  . Colon cancer Neg Hx   . Hypertension Sister   . Diabetes Mellitus II Sister   . Hypertension Brother   . Diabetes Mellitus II Brother   . Hypertension Brother   . Diabetes Mellitus II Brother   . Hypertension Brother   . Diabetes Mellitus II Brother      Social History Ms. Pascarella reports that she has been smoking Cigarettes.  She started smoking about 53 years ago. She has a 25 pack-year smoking history. She has never used smokeless tobacco. Ms. Remmel reports that she does not drink alcohol.   Review of Systems CONSTITUTIONAL: No weight loss, fever, chills, weakness or fatigue.  HEENT: Eyes: No visual loss, blurred vision, double vision or yellow sclerae.No hearing loss, sneezing, congestion, runny nose or sore throat.  SKIN: No rash or itching.  CARDIOVASCULAR: per HPI RESPIRATORY: No shortness of breath, cough or sputum.  GASTROINTESTINAL: No anorexia, nausea, vomiting or diarrhea. No abdominal pain or blood.  GENITOURINARY: No burning on urination, no polyuria NEUROLOGICAL: No headache, dizziness, syncope, paralysis, ataxia, numbness or tingling in the extremities. No change in bowel or bladder control.  MUSCULOSKELETAL: No muscle, back pain, joint pain or stiffness.  LYMPHATICS: No enlarged nodes. No history of splenectomy.  PSYCHIATRIC: No history of depression or anxiety.  ENDOCRINOLOGIC: No reports of sweating, cold or heat intolerance. No polyuria or polydipsia.  [name]   Physical Examination Filed Vitals:   04/06/15 0945  BP: 124/62  Pulse: 52    Filed Vitals:   04/06/15 0945  Height:  (1.6 m)  Weight: 159 lb (72.122 kg)    Gen: resting comfortably, no acute distress HEENT: no scleral icterus, pupils equal round and reactive, no palptable cervical adenopathy,  CV: RRR, no m/r/g, no JVD, +bilateral carotid bruits Resp: Clear to auscultation bilaterally GI: abdomen is soft, non-tender, non-distended, normal bowel sounds, no hepatosplenomegaly MSK: extremities are warm, no edema.  Skin: warm, no rash Neuro:  no focal deficits Psych: appropriate affect   Diagnostic Studies 02/2013 Cath  Procedural Findings:  Hemodynamics:  AO 116/45 with a mean of 70  LV 120/18  Coronary angiography:  Coronary dominance: right  Left mainstem: The left main is patent with 30% distal left main stenosis as the vessel trifurcates into the LAD, intermediate branch, and left circumflex.  Left anterior descending (  LAD): The LAD is patent with diffuse disease noted. There is minimal calcification present. The first diagonal is patent with 30-50% diffuse disease. The LAD after the first septal perforator has 40-50% stenosis. The vessel reaches the apex and wraps around the left ventricular apex without significant stenosis.  Left circumflex (LCx): The intermediate branch is small in caliber and diffusely diseased with 50-60% proximal vessel stenosis the AV groove circumflex is diffusely diseased with 50% ostial stenosis and 30% mid stenosis it supplies 2 small posterolateral branch  Right coronary artery (RCA): The RCA is diffusely diseased. There is significant tortuosity, especially around the junction of the mid and distal vessel. The vessel is diffusely calcified. The mid vessel has tandem 50% stenoses. The distal vessel has 80-90 % stenosis just before the bifurcation of the PDA and posterior AV segment. The PDA and posterior AV segment with 3 posterolateral branches are patent.  Left ventriculography: Left ventricular systolic  function is moderately depressed. There is severe hypokinesis of the basal and midinferior walls. The anterolateral and apical walls contract normally. The left ventricular ejection fraction is estimated at 45%.  Abdominal aortogram: The abdominal aorta is patent. The renal arteries are single bilaterally and they are widely patent. There appears to be nonobstructive stenosis of the right common iliac artery at the aortoiliac junction. The left common iliac artery appears to have significant ostial stenosis estimated at at least 75% angiographically. The visualized portions of the external iliac and common femoral arteries are patent  Final Conclusions:  1. Severe single-vessel coronary artery disease involving the distal right coronary artery  2. Moderate LV dysfunction with segmental severe hypokinesis of the inferior wall and normal contraction of the antero-apex with estimated left ventricular ejection fraction of 45%.  3. Severe left common iliac artery stenosis  Recommendations: The patient has severe distal RCA disease. She has a known history of inferior wall infarction and remote PCA. This fits with that clinical history. She has nonobstructive disease of the left coronary artery. Her left ventricular ejection fraction by ventriculography is clearly greater than 35%. With her history of previous MI, I do not think PCI is indicated with an absence of angina. Will carefully discussed symptoms with the patient, but I am inclined to treat her medically. Will also review indication for consideration of peripheral intervention for her iliac disease.    01/2013 Echo  Study Conclusions  - Left ventricle: The cavity size was mildly to moderately dilated. Wall thickness was normal. Systolic function was severely reduced. The estimated ejection fraction was 25%. Severe diffuse hypokinesis. - Aortic valve: Mildly calcified annulus. Trileaflet. - Mitral valve: Calcified annulus. - Left atrium:  The atrium was moderately dilated. - Right ventricle: The cavity size was normal. Wall thickness was mildly to moderately increased. - Right atrium: The atrium was mildly dilated. - Atrial septum: No defect or patent foramen ovale was identified. Impressions:  - Compared to the prior study of 03/13/11, there has been substantial deterioration in LV systolic function.   07/2014 Echo Study Conclusions  - Left ventricle: The cavity size was severely dilated. Wall thickness was normal. The estimated ejection fraction was 20%. Diffuse hypokinesis. There is akinesis of the inferolateral and inferior myocardium. Features are consistent with a pseudonormal left ventricular filling pattern, with concomitant abnormal relaxation and increased filling pressure (grade 2 diastolic dysfunction). Doppler parameters are consistent with high ventricular filling pressure. - Mitral valve: There was mild to moderate regurgitation. - Left atrium: The atrium was moderately dilated.  Impressions:  -  Global hypokinesis; inferior and inferolateral akinesis; overall severely reduced LV function; grade 2 diastolic dysfunction with elevated left ventricular filling pressures, moderate LAE; mild to moderate MR.  07/2014 Cath PROCEDURAL FINDINGS  Hemodynamics:  AO 122/48  LV 118/14  RA 9  RV 41/11  PA 37/5 with a mean of 21  PCWP 27  Oxygen saturations  AO 90  PA 56  Cardiac output 3.5  Cardiac index 2.0  Coronary angiography:  Coronary dominance: right  Left mainstem: The left mainstem is patent with 30% distal left main stenosis.  Left anterior descending (LAD): The LAD is diffusely diseased in the midportion. There are sequential 50% stenoses, unchanged from previous study. The first diagonal branch is large in distribution with diffuse nonobstructive disease noted. The mid and distal LAD are patent without significant disease.  Left circumflex (LCx): The left circumflex has 50%  ostial stenosis, 50% mid vessel stenosis, and to OM branches with no significant disease. There is an intermediate branch is diffusely diseased with up to 70% stenosis. This vessel is small in caliber.  Right coronary artery (RCA): Dominant vessel. Tortuous with diffuse nonobstructive stenosis throughout the proximal and mid portions. There are diffuse 50% stenosis present. The distal vessel has an ulcerated-appearing irregular 90% stenosis involving the bifurcation of the PDA and posterolateral branches. The posterolateral branches are patent with mild diffuse disease. The PDA arises an acute angulation with 30-40% stenosis. The mid body of the PDA has an 80-90% stenosis in an area where the vessel is too small for PCI.  Left ventriculography: The ventricle appears the synchronous. The basal and midinferior wall are akinetic. The basal inferolateral wall is severely hypokinetic. The anterolateral wall and apex contract normally. The estimated LVEF is 30%.  PCI Procedure Note: Following the diagnostic procedure, the decision was made to proceed with PCI of the distal RCA. The lesion is complex, there is a large amount of myocardium involved. I felt there was a risk of potentially compromising the PDA branch, but overall in my opinion the risk/benefit was favorable for PCI considering the severe stenosis involving the entire RCA distribution. The sheath was upsized to a 6 [place]. Weight-based bivalirudin was given for anticoagulation. The patient was loaded with Plavix 600 mg on the table. Once a therapeutic ACT was achieved, a 6 [place] JR 4 guide catheter was inserted. A cougar coronary guidewire was used to cross the lesion into the PLA branch. The lesion was predilated with a 2.5 x 15 mm balloon. The lesion was then stented with a 3.0 by 16mm Promus DES stent. The stent was postdilated with a 3.25 mm noncompliant balloon to 18 atmospheres. The PDA was stented across as the stent extended from the distal RCA  proximal PLA branch. The PDA was totally occluded. I made a long attempt with a whisper wire using multiple bands on the tip of the wire, but all of these attempts were unsuccessful at rewiring the PDA. The patient was completely asymptomatic and specifically denied any chest pain. Her hemodynamics remained stable and there were no changes in her rhythm or blood pressure. A final image of the left coronary artery was obtained and this demonstrated a collateral from the LAD to the PDA branch. Following PCI, there was 0% residual stenosis and TIMI-3 flow. Final angiography confirmed an excellent result. Femoral hemostasis was achieved will be achieved with manual hemostasis. The patient tolerated the PCI procedure well. There were no immediate procedural complications. The patient was transferred to the post catheterization recovery  area for further monitoring.  PCI Data:  Vessel - RCA/Segment - distal  Percent Stenosis (pre) 90  TIMI-flow 3  Stent 3.0 by 16mm Promus DES  Percent Stenosis (post) 0  TIMI-flow (post) 3  Contrast: 175 cc Omnipaque  Radiation dose/Fluoro time: 15.5 minutes  Estimated Blood Loss: Minimal  Final Conclusions:  1. Nonobstructive disease involving the LAD, left circumflex, and OM/diagonal side branches  2. Severe stenosis of the distal RCA bifurcation, treated successfully with a drug-eluting stent into the PLA branch with residual occlusion of the PDA branch  3. Severe segmental LV systolic dysfunction  Recommendations: Dual antiplatelet therapy with aspirin and Plavix for at least 12 months. Will cycle cardiac enzymes since the PDA branch is occluded after stenting.  10/2014 Echo Study Conclusions  - Procedure narrative: Transthoracic echocardiography. Image quality was suboptimal. The study was technically difficult, as a result of poor sound wave transmission and body habitus. - Left ventricle: Systolic function is severely reduced, estimated EF  15-20%. The cavity size was severely dilated. Doppler parameters are consistent with abnormal left ventricular relaxation (grade 1 diastolic dysfunction). Doppler parameters are consistent with high ventricular filling pressure. Medial E/e&' 24. - Regional wall motion abnormality: Akinesis of the basal-mid inferior and basal inferolateral myocardium; severe hypokinesis of the apical inferior and mid inferolateral myocardium. Severe, diffuse hypokinesis. - Ventricular septum: Septal motion showed abnormal function and dyssynergy. These changes are consistent with intraventricular conduction delay. - Aortic valve: Mildly calcified annulus. Trileaflet; mildly thickened leaflets. - Mitral valve: Mildly dilated annulus. Mildly thickened leaflets . There was moderate eccentric regurgitation. 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 - Left atrium: The atrium was severely dilated. Volume/bsa, S: 47.1 ml/m^2. - Right ventricle: Systolic function was mildly reduced. - Right atrium: The atrium was mildly dilated. - Atrial septum: There was increased thickness of the septum, consistent with lipomatous hypertrophy. - Tricuspid valve: There was mild regurgitation. - Pulmonary arteries: PA peak pressure: 42 mm Hg (S). Mildly elevated pulmonary pressures. - Pericardium, extracardiac: There was a small pericardial effusion, with no evidence of tamponade physiology.         Assessment and Plan  1. CAD - no current symptoms, continue DAPT at least until 07/2015 - continue risk factor modification and secondary prevention  2. Chronic systolic HF - echo 07/2014 LVEF 20%, NYHA II, currently being evaluated for ICD, has life vest -will restart losartan 25mg  daily  3. NSVT - Continue lifevest, continue beta blocker - has ICD placement sccheduled  4. Mitral regurgitation Mild to moderate by last echo, continue to follow clinically.  5.  Hyperlipidemia - options limited by cost, continue pravastatin 80mg  daily  6. Carotid Bruit - obtain US  F/u 1 month   Antoine Poche, M.D.

## 2015-04-11 ENCOUNTER — Ambulatory Visit (HOSPITAL_COMMUNITY)
Admission: RE | Admit: 2015-04-11 | Discharge: 2015-04-11 | Disposition: A | Discharge status: Home / Self Care | Payer: Medicare Other | Source: Ambulatory Visit | Attending: Cardiology | Admitting: Cardiology

## 2015-04-11 ENCOUNTER — Other Ambulatory Visit (INDEPENDENT_AMBULATORY_CARE_PROVIDER_SITE_OTHER): Payer: Medicare Other | Admitting: *Deleted

## 2015-04-11 DIAGNOSIS — I5022 Chronic systolic (congestive) heart failure: Secondary | ICD-10-CM | POA: Diagnosis not present

## 2015-04-11 DIAGNOSIS — R0989 Other specified symptoms and signs involving the circulatory and respiratory systems: Secondary | ICD-10-CM | POA: Insufficient documentation

## 2015-04-11 DIAGNOSIS — I6523 Occlusion and stenosis of bilateral carotid arteries: Secondary | ICD-10-CM | POA: Diagnosis not present

## 2015-04-11 DIAGNOSIS — I429 Cardiomyopathy, unspecified: Secondary | ICD-10-CM | POA: Diagnosis not present

## 2015-04-11 DIAGNOSIS — Z01812 Encounter for preprocedural laboratory examination: Secondary | ICD-10-CM | POA: Diagnosis not present

## 2015-04-11 DIAGNOSIS — Z720 Tobacco use: Secondary | ICD-10-CM | POA: Insufficient documentation

## 2015-04-11 DIAGNOSIS — I2510 Atherosclerotic heart disease of native coronary artery without angina pectoris: Secondary | ICD-10-CM | POA: Diagnosis not present

## 2015-04-11 DIAGNOSIS — I6522 Occlusion and stenosis of left carotid artery: Secondary | ICD-10-CM | POA: Diagnosis not present

## 2015-04-11 DIAGNOSIS — I10 Essential (primary) hypertension: Secondary | ICD-10-CM | POA: Diagnosis not present

## 2015-04-11 LAB — BASIC METABOLIC PANEL
BUN: 19 mg/dL (ref 6–23)
CALCIUM: 9.6 mg/dL (ref 8.4–10.5)
CO2: 25 mEq/L (ref 19–32)
CREATININE: 1.17 mg/dL (ref 0.40–1.20)
Chloride: 101 mEq/L (ref 96–112)
GFR: 58.46 mL/min — ABNORMAL LOW (ref >60.00)
Glucose, Bld: 94 mg/dL (ref 70–99)
POTASSIUM: 4.3 meq/L (ref 3.5–5.1)
SODIUM: 132 meq/L — AB (ref 135–145)

## 2015-04-11 LAB — CBC WITH DIFFERENTIAL/PLATELET
BASOS ABS: 0 10*3/uL (ref 0.0–0.1)
BASOS PCT: 0.5 % (ref 0.0–3.0)
EOS ABS: 0.2 10*3/uL (ref 0.0–0.7)
Eosinophils Relative: 2.5 % (ref 0.0–5.0)
HCT: 43.2 % (ref 36.0–46.0)
Hemoglobin: 14.3 g/dL (ref 12.0–15.0)
LYMPHS ABS: 2.5 10*3/uL (ref 0.7–4.0)
Lymphocytes Relative: 35.5 % (ref 12.0–46.0)
MCHC: 33.1 g/dL (ref 30.0–36.0)
MCV: 88.2 fl (ref 78.0–100.0)
Monocytes Absolute: 0.6 10*3/uL (ref 0.1–1.0)
Monocytes Relative: 9.2 % (ref 3.0–12.0)
NEUTROS ABS: 3.6 10*3/uL (ref 1.4–7.7)
NEUTROS PCT: 52.3 % (ref 43.0–77.0)
PLATELETS: 225 10*3/uL (ref 150.0–400.0)
RBC: 4.91 Mil/uL (ref 3.87–5.11)
RDW: 15 % (ref 11.5–15.5)
WBC: 6.9 10*3/uL (ref 4.0–10.5)

## 2015-04-11 NOTE — Addendum Note (Signed)
Addended by: Tonita Phoenix on: 04/11/2015 08:38 AM   Modules accepted: Orders

## 2015-04-11 NOTE — Addendum Note (Signed)
Addended by: BOWDEN, ROBIN K on: 04/11/2015 08:38 AM   Modules accepted: Orders  

## 2015-04-18 ENCOUNTER — Encounter (HOSPITAL_COMMUNITY)
Admission: RE | Disposition: A | Discharge status: Home / Self Care | Payer: Medicare Other | Source: Ambulatory Visit | Attending: Internal Medicine

## 2015-04-18 ENCOUNTER — Ambulatory Visit (HOSPITAL_COMMUNITY)
Admission: RE | Admit: 2015-04-18 | Discharge: 2015-04-19 | Disposition: A | Discharge status: Home / Self Care | Payer: Medicare Other | Source: Ambulatory Visit | Attending: Internal Medicine | Admitting: Internal Medicine

## 2015-04-18 ENCOUNTER — Encounter (HOSPITAL_COMMUNITY): Payer: Self-pay | Admitting: General Practice

## 2015-04-18 DIAGNOSIS — I340 Nonrheumatic mitral (valve) insufficiency: Secondary | ICD-10-CM | POA: Insufficient documentation

## 2015-04-18 DIAGNOSIS — I5022 Chronic systolic (congestive) heart failure: Secondary | ICD-10-CM | POA: Diagnosis not present

## 2015-04-18 DIAGNOSIS — Z7982 Long term (current) use of aspirin: Secondary | ICD-10-CM | POA: Insufficient documentation

## 2015-04-18 DIAGNOSIS — E780 Pure hypercholesterolemia: Secondary | ICD-10-CM | POA: Diagnosis not present

## 2015-04-18 DIAGNOSIS — Z7902 Long term (current) use of antithrombotics/antiplatelets: Secondary | ICD-10-CM | POA: Diagnosis not present

## 2015-04-18 DIAGNOSIS — E785 Hyperlipidemia, unspecified: Secondary | ICD-10-CM | POA: Insufficient documentation

## 2015-04-18 DIAGNOSIS — Z955 Presence of coronary angioplasty implant and graft: Secondary | ICD-10-CM | POA: Insufficient documentation

## 2015-04-18 DIAGNOSIS — I447 Left bundle-branch block, unspecified: Secondary | ICD-10-CM | POA: Insufficient documentation

## 2015-04-18 DIAGNOSIS — I10 Essential (primary) hypertension: Secondary | ICD-10-CM | POA: Insufficient documentation

## 2015-04-18 DIAGNOSIS — I509 Heart failure, unspecified: Secondary | ICD-10-CM | POA: Insufficient documentation

## 2015-04-18 DIAGNOSIS — I472 Ventricular tachycardia: Secondary | ICD-10-CM | POA: Insufficient documentation

## 2015-04-18 DIAGNOSIS — I255 Ischemic cardiomyopathy: Secondary | ICD-10-CM | POA: Insufficient documentation

## 2015-04-18 DIAGNOSIS — Z959 Presence of cardiac and vascular implant and graft, unspecified: Secondary | ICD-10-CM

## 2015-04-18 DIAGNOSIS — I252 Old myocardial infarction: Secondary | ICD-10-CM | POA: Diagnosis not present

## 2015-04-18 DIAGNOSIS — Z01812 Encounter for preprocedural laboratory examination: Secondary | ICD-10-CM

## 2015-04-18 DIAGNOSIS — I2510 Atherosclerotic heart disease of native coronary artery without angina pectoris: Secondary | ICD-10-CM | POA: Diagnosis not present

## 2015-04-18 DIAGNOSIS — I429 Cardiomyopathy, unspecified: Secondary | ICD-10-CM

## 2015-04-18 HISTORY — PX: EP IMPLANTABLE DEVICE: SHX172B

## 2015-04-18 LAB — SURGICAL PCR SCREEN
MRSA, PCR: NEGATIVE
Staphylococcus aureus: NEGATIVE

## 2015-04-18 SURGERY — BIV ICD INSERTION CRT-D
Anesthesia: LOCAL

## 2015-04-18 MED ORDER — CLOPIDOGREL BISULFATE 75 MG PO TABS
75.0000 mg | ORAL_TABLET | Freq: Every day | ORAL | Status: DC
  Administered 2015-04-19: 75 mg via ORAL
  Filled 2015-04-18: qty 1

## 2015-04-18 MED ORDER — CEFAZOLIN SODIUM 1-5 GM-% IV SOLN
1.0000 g | Freq: Four times a day (QID) | INTRAVENOUS | Status: AC
  Administered 2015-04-18 – 2015-04-19 (×3): 1 g via INTRAVENOUS
  Filled 2015-04-18 (×3): qty 50

## 2015-04-18 MED ORDER — SODIUM CHLORIDE 0.9 % IV SOLN
INTRAVENOUS | Status: AC
Start: 2015-04-18 — End: 2015-04-18

## 2015-04-18 MED ORDER — FENTANYL CITRATE (PF) 100 MCG/2ML IJ SOLN
INTRAMUSCULAR | Status: AC
  Filled 2015-04-18: qty 2

## 2015-04-18 MED ORDER — NITROGLYCERIN 0.4 MG SL SUBL: 0.4000 mg | SUBLINGUAL_TABLET | SUBLINGUAL | Status: DC | PRN

## 2015-04-18 MED ORDER — LOSARTAN POTASSIUM 25 MG PO TABS
25.0000 mg | ORAL_TABLET | Freq: Every day | ORAL | Status: DC
  Administered 2015-04-19: 25 mg via ORAL
  Filled 2015-04-18 (×2): qty 1

## 2015-04-18 MED ORDER — SPIRONOLACTONE 25 MG PO TABS
12.5000 mg | ORAL_TABLET | Freq: Two times a day (BID) | ORAL | Status: DC
  Administered 2015-04-18: 12.5 mg via ORAL
  Filled 2015-04-18: qty 1

## 2015-04-18 MED ORDER — MIDAZOLAM HCL 5 MG/5ML IJ SOLN
INTRAMUSCULAR | Status: AC
  Filled 2015-04-18: qty 5

## 2015-04-18 MED ORDER — ACETAMINOPHEN 325 MG PO TABS
325.0000 mg | ORAL_TABLET | ORAL | Status: DC | PRN
  Filled 2015-04-18: qty 2

## 2015-04-18 MED ORDER — FUROSEMIDE 80 MG PO TABS
80.0000 mg | ORAL_TABLET | Freq: Every day | ORAL | Status: DC
  Filled 2015-04-18: qty 1

## 2015-04-18 MED ORDER — DEXTROSE 5 % IV SOLN
2.0000 g | INTRAVENOUS | Status: DC | PRN
  Administered 2015-04-18: 2 g via INTRAVENOUS

## 2015-04-18 MED ORDER — CEFAZOLIN SODIUM-DEXTROSE 2-3 GM-% IV SOLR: 2.0000 g | INTRAVENOUS | Status: DC

## 2015-04-18 MED ORDER — MUPIROCIN 2 % EX OINT
TOPICAL_OINTMENT | CUTANEOUS | Status: AC
  Administered 2015-04-18: 1 via TOPICAL
  Filled 2015-04-18: qty 22

## 2015-04-18 MED ORDER — SODIUM CHLORIDE 0.9 % IR SOLN: 80.0000 mg | Status: DC

## 2015-04-18 MED ORDER — SODIUM CHLORIDE 0.9 % IV BOLUS (SEPSIS)
INTRAVENOUS | Status: DC | PRN
Start: 2015-04-18 — End: 2015-04-18
  Administered 2015-04-18: 400 mL via INTRAVENOUS

## 2015-04-18 MED ORDER — SODIUM CHLORIDE 0.9 % IV SOLN
INTRAVENOUS | Status: DC
  Administered 2015-04-18: 07:00:00 via INTRAVENOUS
  Administered 2015-04-18: 200 mL/h via INTRAVENOUS

## 2015-04-18 MED ORDER — ONDANSETRON HCL 4 MG/2ML IJ SOLN: 4.0000 mg | Freq: Four times a day (QID) | INTRAMUSCULAR | Status: DC | PRN

## 2015-04-18 MED ORDER — PRAVASTATIN SODIUM 40 MG PO TABS
80.0000 mg | ORAL_TABLET | Freq: Every evening | ORAL | Status: DC
  Administered 2015-04-18: 80 mg via ORAL
  Filled 2015-04-18: qty 2

## 2015-04-18 MED ORDER — FENTANYL CITRATE (PF) 100 MCG/2ML IJ SOLN
INTRAMUSCULAR | Status: DC | PRN
  Administered 2015-04-18: 25 ug via INTRAVENOUS

## 2015-04-18 MED ORDER — ASPIRIN EC 81 MG PO TBEC
81.0000 mg | DELAYED_RELEASE_TABLET | Freq: Every day | ORAL | Status: DC
  Administered 2015-04-19: 81 mg via ORAL
  Filled 2015-04-18 (×2): qty 1

## 2015-04-18 MED ORDER — CEFAZOLIN SODIUM-DEXTROSE 2-3 GM-% IV SOLR
INTRAVENOUS | Status: AC
  Filled 2015-04-18: qty 50

## 2015-04-18 MED ORDER — POTASSIUM CHLORIDE CRYS ER 20 MEQ PO TBCR
20.0000 meq | EXTENDED_RELEASE_TABLET | Freq: Every day | ORAL | Status: DC
  Administered 2015-04-19: 20 meq via ORAL
  Filled 2015-04-18 (×2): qty 1

## 2015-04-18 MED ORDER — GENTAMICIN SULFATE 40 MG/ML IJ SOLN
INTRAMUSCULAR | Status: DC | PRN
  Administered 2015-04-18: 500 mL

## 2015-04-18 MED ORDER — SODIUM CHLORIDE 0.9 % IV SOLN: INTRAVENOUS | Status: DC | PRN

## 2015-04-18 MED ORDER — LIDOCAINE HCL (PF) 1 % IJ SOLN
INTRAMUSCULAR | Status: DC | PRN
  Administered 2015-04-18: 36 mg via SUBCUTANEOUS

## 2015-04-18 MED ORDER — CARVEDILOL 12.5 MG PO TABS
12.5000 mg | ORAL_TABLET | Freq: Two times a day (BID) | ORAL | Status: DC
  Administered 2015-04-18 – 2015-04-19 (×2): 12.5 mg via ORAL
  Filled 2015-04-18 (×3): qty 1

## 2015-04-18 MED ORDER — MUPIROCIN 2 % EX OINT
1.0000 "application " | TOPICAL_OINTMENT | Freq: Once | CUTANEOUS | Status: AC
  Administered 2015-04-18: 1 via TOPICAL

## 2015-04-18 MED ORDER — MIDAZOLAM HCL 5 MG/5ML IJ SOLN
INTRAMUSCULAR | Status: DC | PRN
  Administered 2015-04-18 (×2): 1 mg via INTRAVENOUS

## 2015-04-18 MED ORDER — ACETAMINOPHEN 325 MG PO TABS: 650.0000 mg | ORAL_TABLET | ORAL | Status: DC | PRN

## 2015-04-18 MED ORDER — CHLORHEXIDINE GLUCONATE 4 % EX LIQD: 60.0000 mL | Freq: Once | CUTANEOUS | Status: DC

## 2015-04-18 MED ORDER — SODIUM CHLORIDE 0.9 % IR SOLN
Status: AC
  Filled 2015-04-18: qty 2

## 2015-04-18 MED ORDER — SODIUM CHLORIDE 0.9 % IV SOLN
INTRAVENOUS | Status: DC
  Administered 2015-04-18: 07:00:00 via INTRAVENOUS

## 2015-04-18 MED ORDER — LIDOCAINE HCL (PF) 1 % IJ SOLN
INTRAMUSCULAR | Status: AC
  Filled 2015-04-18: qty 60

## 2015-04-18 MED ORDER — IOHEXOL 350 MG/ML SOLN
INTRAVENOUS | Status: DC | PRN
  Administered 2015-04-18: 15 mg via INTRAVENOUS

## 2015-04-18 SURGICAL SUPPLY — 17 items
CABLE SURGICAL S-101-97-12 (CABLE) ×2 IMPLANT
CATH ATTAIN COM SURV 6250V-MB2 (CATHETERS) ×2 IMPLANT
HEMOSTAT SURGICEL 2X4 FIBR (HEMOSTASIS) IMPLANT
ICD VIVA QUAD XT CRT-D DTBA1QQ (ICD Generator) ×2 IMPLANT
KIT ESSENTIALS PG (KITS) ×2 IMPLANT
LEAD ATTAIN PERFORM ST 4398-88 (Lead) ×2 IMPLANT
LEAD CAPSURE NOVUS 45CM (Lead) ×2 IMPLANT
LEAD SPRINT QUAT SEC 6935M-62 (Lead) ×2 IMPLANT
PAD DEFIB LIFELINK (PAD) ×2 IMPLANT
SHEATH CLASSIC 7F (SHEATH) ×2 IMPLANT
SHEATH CLASSIC 8F (SHEATH) IMPLANT
SHEATH CLASSIC 9.5F (SHEATH) ×2 IMPLANT
SHEATH CLASSIC 9F (SHEATH) ×2 IMPLANT
SHIELD RADPAD SCOOP 12X17 (MISCELLANEOUS) ×2 IMPLANT
TRAY PACEMAKER INSERTION (CUSTOM PROCEDURE TRAY) ×2 IMPLANT
WIRE ACUITY WHISPER EDS 4648 (WIRE) ×6 IMPLANT
WIRE HI TORQ VERSACORE-J 145CM (WIRE) ×4 IMPLANT

## 2015-04-18 NOTE — Interval H&P Note (Signed)
ICD Criteria  Current LVEF:20% ;Obtained > 3 months ago and < or = 6 months ago.   NYHA Functional Classification: Class III  Heart Failure History:  Yes, Duration of heart failure since onset is > 9 months  Non-Ischemic Dilated Cardiomyopathy History:  Yes, timeframe is > 9 months  Atrial Fibrillation/Atrial Flutter:  No.  Ventricular Tachycardia History:  Yes, No hemodynamic instability. VT Type:  NSVT.  Cardiac Arrest History:  No  History of Syndromes with Risk of Sudden Death:  No.  Previous ICD:  No.  Electrophysiology Study: No.  Prior MI: Yes, Most recent MI timeframe is > 40 days.  PPM: No.  OSA:  No  Patient Life Expectancy of >=1 year: Yes.  Anticoagulation Therapy:  Patient is NOT on anticoagulation therapy.   Beta Blocker Therapy:  Yes.   Ace Inhibitor/ARB Therapy:  Yes.History and Physical Interval Note:  04/18/2015 7:11 AM  Nicole Clements  has presented today for surgery, with the diagnosis of chronic systolic chf  The various methods of treatment have been discussed with the patient and family. After consideration of risks, benefits and other options for treatment, the patient has consented to  Procedure(s): BiV ICD Insertion CRT-D (N/A) as a surgical intervention .  The patient's history has been reviewed, patient examined, no change in status, stable for surgery.  I have reviewed the patient's chart and labs.  Questions were answered to the patient's satisfaction.     Sherryl Manges

## 2015-04-18 NOTE — H&P (View-Only) (Signed)
    Patient Care Team: Kimberlee Shaw, MD as PCP - General (Family Medicine) Charles W Lomax, MD (Obstetrics and Gynecology) Robert Echerd, OD as Referring Physician (Optometry) Sandi L Fields, MD (Gastroenterology) Thomas C Wall, MD as Consulting Physician (Cardiology)   HPI  Nicole Clements is a 74 y.o. female Seen in follow-up for left ventricular dysfunction. She has a history of ischemic heart disease with prior stenting of the RCA and RPDA.  Ejection fraction most recently 12/15 was 15-20%. Catheterization 9/15 demonstrated no obstructive disease and LV EF of about 30%.  She has history of frequent ventricular ectopy. Prior to proceeding with device implantation during evaluation in February, we undertook a Holter monitor which demonstrated 10% PVCs and we began  Antiarrhythmic trial with amiodarone with a significant reduction in PVCs--3%. She had a subsequent echo 3/16 demonstrating no interval improvement.  She continues with modest exercise intolerance. She has no peripheral edema. She's had no syncope.  Past Medical History  Diagnosis Date  . Myocardial infarct 07/22/1996    st.elevated inferior wall infarct  . CAD (coronary artery disease) April 2014    RCA disease- Med Rx   . Hypertension   . Hypercholesterolemia   . S/P colonoscopy August 2007    hyperplastic polyps, rare sigmoid and descending colon diverticulosis, small internal hemorrhoids  . CHF (congestive heart failure) 12/31/2012  . Ischemic cardiomyopathy     EF 25%-45%  . PVC's (premature ventricular contractions)   . IVCD (intraventricular conduction defect)   . S/P coronary artery stent placement DES-PLA of RCA and occlusion of PDA 07/17/14 07/18/2014    Past Surgical History  Procedure Laterality Date  . Cardiac catheterization  April 2014    Med Rx  . S/p hysterectomy    . Left and right heart catheterization with coronary angiogram N/A 07/17/2014    Procedure: LEFT AND RIGHT HEART CATHETERIZATION  WITH CORONARY ANGIOGRAM;  Surgeon: Michael D Cooper, MD;  Location: [facility];  Service: Cardiovascular;  Laterality: N/A;  . Percutaneous coronary stent intervention (pci-s)  07/17/2014    Procedure: PERCUTANEOUS CORONARY STENT INTERVENTION (PCI-S);  Surgeon: Michael D Cooper, MD;  Location: [facility];  Service: Cardiovascular;;  Distal RCA    Current Outpatient Prescriptions  Medication Sig Dispense Refill  . acetaminophen (TYLENOL) 325 MG tablet Take 2 tablets (650 mg total) by mouth every 4 (four) hours as needed for headache or mild pain.    . amiodarone (PACERONE) 200 MG tablet Take 1 tablet (200 mg total) by mouth daily. 30 tablet 3  . aspirin EC 81 MG EC tablet Take 1 tablet (81 mg total) by mouth daily.    . carvedilol (COREG) 25 MG tablet take 1 tablet by mouth twice a day 60 tablet 6  . Cholecalciferol (VITAMIN D3) 2000 UNITS capsule Take 2,000 Units by mouth daily.      . clopidogrel (PLAVIX) 75 MG tablet Take 1 tablet (75 mg total) by mouth daily with breakfast. 30 tablet 11  . furosemide (LASIX) 80 MG tablet Take 1 tablet (80 mg total) by mouth daily. 30 tablet 11  . losartan (COZAAR) 100 MG tablet Take 1 tablet (100 mg total) by mouth daily. 30 tablet 8  . nitroGLYCERIN (NITROSTAT) 0.4 MG SL tablet Place 1 tablet (0.4 mg total) under the tongue every 5 (five) minutes x 3 doses as needed for chest pain. 25 tablet 2  . Omega-3 Fatty Acids (FISH OIL) 1000 MG CAPS Take 1,000 mg by mouth daily.     .   potassium chloride SA (KLOR-CON M20) 20 MEQ tablet Take 1 tablet (20 mEq total) by mouth daily. 90 tablet 3  . pravastatin (PRAVACHOL) 80 MG tablet Take 1 tablet (80 mg total) by mouth every evening. 90 tablet 3  . spironolactone (ALDACTONE) 25 MG tablet Take 12.5 mg by mouth 2 (two) times daily.     No current facility-administered medications for this visit.    Allergies  Allergen Reactions  . Sulfa Antibiotics Rash    Review of Systems negative except from HPI and  PMH  Physical Exam BP 102/70 mmHg  Pulse 40  Ht 5' 2.5" (1.588 m)  Wt 159 lb 3.2 oz (72.213 kg)  BMI 28.64 kg/m2 Well developed and well nourished in no acute distress HENT normal E scleral and icterus clear Neck Supple JVP flat; carotids brisk and full Clear to ausculation  Regular rate and rhythm, no murmurs gallops or rub Soft with active bowel sounds No clubbing cyanosis  Edema Alert and oriented, grossly normal motor and sensory function Skin Warm and Dry  Electrocardiogram was ordered today and this demonstrated sinus rhythm at 40 interval 17/60/54 Left bundle branch block.   Ischemic//nonischemic Cardiomyopathy  CHF chronic Systolic  Ventricular Ectopy//PVCs and VT-Nonsustained  Left bundle branch block  Sinus bradycardia    She has persistent cardiomyopathy not withstanding antiarrhythmic  suppression of ventricular ectopy.   Thus no reversible cause for her cardiomyopathy is identified   She has congestive failure albeit mild in the setting of combined ischemic and nonischemic cardiomyopathy. She has a very broad left bundle and hence is an appropriately considered for CRT-D implantation for both reduction in risk of sudden death as well as mitigation of symptoms and progression of heart failure.  Her sinus bradycardia is impressive. We will decrease her carvedilol for right now 25--12.5 with plans to resume following device implantation  Have reviewed the potential benefits and risks of ICD implantation including but not limited to death, perforation of heart or lung, lead dislodgement, infection,  device malfunction and inappropriate shocks.  The patient and family express understanding  and are willing to proceed.      

## 2015-04-18 NOTE — Discharge Summary (Signed)
ELECTROPHYSIOLOGY PROCEDURE DISCHARGE SUMMARY    Patient ID: Nicole Clements,  MRN: [id number], DOB/AGE: February 02, 1941 74 y.o.  Admit date: 04/18/2015 Discharge date: 04/19/2015  Primary Care Physician: Lupita Raider, MD Primary Cardiologist: Wyline Mood Electrophysiologist: Graciela Husbands   Primary Discharge Diagnosis:  Ischemic cardiomyopathy, congestive heart failure, LBBB status post CRTD implantation this admission  Secondary Discharge Diagnosis:  1.  CAD s/p DES to RCA 2.  NSVT 3.  Mild to moderate MR 4.  Hyperlipidemia 5.  Hypertension    Allergies  Allergen Reactions  . Sulfa Antibiotics Rash     Procedures This Admission:  1.  Implantation of a MDT CRTD on 04/18/15 by Dr Graciela Husbands. See op note for full details. There were no immediate post procedure complications. 2.  CXR on 04/19/15 demonstrated no pneumothorax status post device implantation.   Brief HPI: Nicole Clements is a 74 y.o. female was referred to electrophysiology in the outpatient setting for consideration of CRTD implantation.  Past medical history includes ischemic cardiomyopathy and congestive heart failure.  The patient has persistent LV dysfunction despite guideline directed therapy.  Risks, benefits, and alternatives to ICD implantation were reviewed with the patient who wished to proceed.   Hospital Course:  The patient was admitted and underwent implantation of a MDT CRTD with details as outlined above. She was monitored on telemetry overnight which demonstrated sinus rhythm with V pacing.  Left chest was without hematoma or ecchymosis.  The device was interrogated and found to be functioning normally.  CXR was obtained and demonstrated no pneumothorax status post device implantation.  Wound care, arm mobility, and restrictions were reviewed with the patient.  The patient was examined and considered stable for discharge to home.   The patient's discharge medications include an ARB (Losartan) and beta blocker (Coreg).     Physical Exam: Filed Vitals:   04/18/15 1420 04/18/15 1617 04/18/15 2052 04/19/15 0533  BP: 103/55 104/45 82/44 98/52   Pulse: 69  70 69  Temp:   98.4 F (36.9 C) 99 F (37.2 C)  TempSrc:   Oral Oral  Resp: Height:      Weight:      SpO2: 98%  98% 97%    Labs:   Lab Results  Component Value Date   WBC 6.9 04/11/2015   HGB 14.3 04/11/2015   HCT 43.2 04/11/2015   MCV 88.2 04/11/2015   PLT 225.0 04/11/2015   No results for input(s): NA, K, CL, CO2, BUN, CREATININE, CALCIUM, PROT, BILITOT, ALKPHOS, ALT, AST, GLUCOSE in the last 168 hours.  Invalid input(s): LABALBU  Discharge Medications:    Medication List    TAKE these medications        acetaminophen 325 MG tablet  Commonly known as:  TYLENOL  Take 2 tablets (650 mg total) by mouth every 4 (four) hours as needed for headache or mild pain.     aspirin 81 MG EC tablet  Take 1 tablet (81 mg total) by mouth daily.     carvedilol 25 MG tablet  Commonly known as:  COREG  Take 0.5 tablets (12.5 mg total) by mouth 2 (two) times daily.     clopidogrel 75 MG tablet  Commonly known as:  PLAVIX  Take 1 tablet (75 mg total) by mouth daily with breakfast.     Fish Oil 1000 MG Caps  Take 1,000 mg by mouth daily.     furosemide 80 MG tablet  Commonly known as:  LASIX  Take 1 tablet (80 mg total) by mouth daily.     losartan 25 MG tablet  Commonly known as:  COZAAR  Take 1 tablet (25 mg total) by mouth daily.     nitroGLYCERIN 0.4 MG SL tablet  Commonly known as:  NITROSTAT  Place 1 tablet (0.4 mg total) under the tongue every 5 (five) minutes x 3 doses as needed for chest pain.     potassium chloride SA 20 MEQ tablet  Commonly known as:  KLOR-CON M20  Take 1 tablet (20 mEq total) by mouth daily.     pravastatin 80 MG tablet  Commonly known as:  PRAVACHOL  Take 1 tablet (80 mg total) by mouth every evening.     spironolactone 25 MG tablet  Commonly known as:  ALDACTONE  Take 12.5 mg by mouth 2  (two) times daily.     Vitamin D3 2000 UNITS capsule  Take 2,000 Units by mouth daily.        Disposition:  Discharge Instructions    Diet - low sodium heart healthy    Complete by:  As directed      Increase activity slowly    Complete by:  As directed           Follow-up Information    Follow up with [facility] On 04/30/2015.   Why:  at [facility] for wound check   Contact information:   [address] [place] [address]       Duration of Discharge Encounter: Greater than 30 minutes including physician time.  Signed, Gypsy Balsam, NP 04/19/2015 8:57 AM     As above instuctiosn given

## 2015-04-18 NOTE — Discharge Instructions (Signed)
° ° °  Supplemental Discharge Instructions for  °Pacemaker/Defibrillator Patients ° °Activity °No heavy lifting or vigorous activity with your left/right arm for 6 to 8 weeks.  Do not raise your left/right arm above your head for one week.  Gradually raise your affected arm as drawn below. ° °        ° °__       04/23/15                  04/24/15                     04/25/15                   04/26/15 ° °NO DRIVING for 1 week    ; you may begin driving on   04/26/15  . ° °WOUND CARE °- Keep the wound area clean and dry.  Do not get this area wet for one week. No showers for one week; you may shower on  04/26/15   . °- The tape/steri-strips on your wound will fall off; do not pull them off.  No bandage is needed on the site.  DO  NOT apply any creams, oils, or ointments to the wound area. °- If you notice any drainage or discharge from the wound, any swelling or bruising at the site, or you develop a fever > 101? F after you are discharged home, call the office at once. ° °Special Instructions °- You are still able to use cellular telephones; use the ear opposite the side where you have your pacemaker/defibrillator.  Avoid carrying your cellular phone near your device. °- When traveling through airports, show security personnel your identification card to avoid being screened in the metal detectors.  Ask the security personnel to use the hand wand. °- Avoid arc welding equipment, MRI testing (magnetic resonance imaging), TENS units (transcutaneous nerve stimulators).  Call the office for questions about other devices. °- Avoid electrical appliances that are in poor condition or are not properly grounded. °- Microwave ovens are safe to be near or to operate. ° °Additional information for defibrillator patients should your device go off: °- If your device goes off ONCE and you feel fine afterward, notify the device clinic nurses. °- If your device goes off ONCE and you do not feel well afterward, call 911. °- If your device  goes off TWICE, call 911. °- If your device goes off THREE times in one day, call 911. ° °DO NOT DRIVE YOURSELF OR A FAMILY MEMBER °WITH A DEFIBRILLATOR TO THE HOSPITAL--CALL 911. ° °

## 2015-04-19 ENCOUNTER — Encounter (HOSPITAL_COMMUNITY): Payer: Self-pay | Admitting: Internal Medicine

## 2015-04-19 ENCOUNTER — Ambulatory Visit (HOSPITAL_COMMUNITY): Payer: Medicare Other

## 2015-04-19 DIAGNOSIS — J984 Other disorders of lung: Secondary | ICD-10-CM | POA: Diagnosis not present

## 2015-04-19 DIAGNOSIS — I255 Ischemic cardiomyopathy: Secondary | ICD-10-CM | POA: Diagnosis not present

## 2015-04-19 DIAGNOSIS — I2510 Atherosclerotic heart disease of native coronary artery without angina pectoris: Secondary | ICD-10-CM | POA: Diagnosis not present

## 2015-04-19 DIAGNOSIS — I447 Left bundle-branch block, unspecified: Secondary | ICD-10-CM | POA: Diagnosis not present

## 2015-04-19 DIAGNOSIS — I252 Old myocardial infarction: Secondary | ICD-10-CM | POA: Diagnosis not present

## 2015-04-19 DIAGNOSIS — I10 Essential (primary) hypertension: Secondary | ICD-10-CM | POA: Diagnosis not present

## 2015-04-19 DIAGNOSIS — I509 Heart failure, unspecified: Secondary | ICD-10-CM | POA: Diagnosis not present

## 2015-04-19 DIAGNOSIS — Z95810 Presence of automatic (implantable) cardiac defibrillator: Secondary | ICD-10-CM | POA: Diagnosis not present

## 2015-04-19 MED ORDER — FUROSEMIDE 20 MG PO TABS
20.0000 mg | ORAL_TABLET | Freq: Every day | ORAL | Status: DC
  Administered 2015-04-19: 20 mg via ORAL
  Filled 2015-04-19: qty 1

## 2015-04-19 MED ORDER — SPIRONOLACTONE 25 MG PO TABS
12.5000 mg | ORAL_TABLET | Freq: Every day | ORAL | Status: DC
  Administered 2015-04-19: 12.5 mg via ORAL
  Filled 2015-04-19: qty 1

## 2015-04-19 MED FILL — Cefazolin Sodium for IV Soln 2 GM and Dextrose 3% (50 ML): INTRAVENOUS | Qty: 50 | Status: AC

## 2015-04-19 NOTE — Op Note (Signed)
NAME:  TOVAH, LOUGHRIDGE NO.:  [id number]  MEDICAL RECORD NO.:  [id number]  LOCATION:  3W22C                        FACILITY:  [facility]  PHYSICIAN:  Duke Salvia, MD, FACCDATE OF BIRTH:  10-May-1941  DATE OF PROCEDURE:  04/18/2015 DATE OF DISCHARGE:                              OPERATIVE REPORT   PREOPERATIVE DIAGNOSES: 1. Ischemic cardiomyopathy. 2. Congestive heart failure. 3. Left bundle-branch block.  POSTOPERATIVE DIAGNOSES: 1. Ischemic cardiomyopathy. 2. Congestive heart failure. 3. Left bundle-branch block.  PROCEDURE:  Dual-chamber defibrillator implantation with left ventricular lead placement.  DESCRIPTION OF PROCEDURE:  Following obtaining informed consent, the patient was brought to the electrophysiology laboratory and placed on the fluoroscopic table in a supine position.  After routine prep and drape of the left upper chest, lidocaine was infiltrated in the prepectoral subclavicular region and incision was made and carried down to layer of the prepectoral fascia using electrocautery and sharp dissection.  A pocket was formed similarly.  Hemostasis was obtained.  Thereafter, attention was turned to gain access to the extrathoracic left subclavian vein, which was accomplished with modest difficulty requiring venography.  The artery was punctured x1 and pressure was held x2 minutes.  Subsequently 3 separate venipunctures were accomplished. Guidewires were placed and retained and sequentially a 9, 9.5, and 7- French sheath were placed through which were passed a Medtronic 6935 62 cm active fixation single coil defibrillator lead, a Medtronic MB2 coronary sinus cannulation catheter, and a Medtronic 5076 45 cm active fixation atrial lead, serial number [id number].  Under fluoroscopic guidance, the RV lead was manipulated to the apex where the bipolar R-wave was 9 with a pace impedance of 1100, threshold less than 1 V at 0.5.  Current threshold was  less than 1 mA and there was no diaphragmatic pacing at 10 V.  The current of injury was modest. The serial number of this lead was [id number].  That lead having been secured, we then turned our attention to gain access to the coronary sinus, which was accomplished without significant difficulty.  Nonocclusive venography demonstrated a low posterolateral branch.  we then used a Wholey wire and mapped higher, and identified higher branches and so we took a nonocclusive venogram demonstrating 2 branches, one in the mid lateral portion and the other on the higher lateral portion with a more anterior takeoff.  We were able to the target and deploy the lead in both veins.  However, the former was not big enough to hold the lead, that was ineffective, we used a straight lead (see below) and the threshold in the other vein was greater than 4 V.  We then returned and targeted the low posterior branch that had moved up under the posterolateral surface.  The Medtronic [id number] lead serial number [id number] was deployed to its distal ramification and the 2-3 portals were identified.  The 2-LV was 150 milliseconds (about 0.75) and in this location the impedance was 875 with a threshold less than 2 V at 0.5 milliseconds without diaphragmatic stimulation at 10 V.  The deployment sheath was removed and the lead was secured to the prepectoral fascia. The atrial lead had actually been placed in between the  initial location of the LV lead and subsequent reimplantation, and the aforementioned lead was deployed in the right atrial appendage where the bipolar P-wave was 2.5 with a pace impedance of 735, a threshold 1.4 V at 0.5 milliseconds.  Current threshold was 2 mA and there was no diaphragmatic pacing at 10 V.  The current of injury was moderate.  This having been selected after numerous places with current of injury was absent.  The leads were then attached to a Medtronic VIVA ICD serial  number [id number].  Through the device, bipolar P-wave was 2.8 with a pace impedance of 532, threshold 0.75 V at 0.4 milliseconds.  The R-wave was 19.9 with a pace impedance of 589, threshold 0.75 at 0.4.  The LV impedance was 779 with threshold 1.5 V at 0.4 milliseconds in the 2-3 configuration and the 2-coil configuration, it was 1 V.  The leads having been secured to the prepectoral fascia, the pocket having been copiously irrigated with antibiotic containing saline solution and hemostasis having been assured, the leads in the pulse generator were placed in the pocket, secured to the prepectoral fascia, and the wound was then closed in 2 layers in normal fashion.  The wound was washed, dried, and a Dermabond dressing was applied.  Needle counts, sponge counts, and instrument counts were correct at the end of the procedure according to the staff.  The patient tolerated the procedure without apparent complications.  Estimated blood loss was minimal.     Duke Salvia, MD, [facility]     SCK/MEDQ  D:  04/18/2015  T:  04/19/2015  Job:  [id number]

## 2015-04-29 ENCOUNTER — Encounter (HOSPITAL_COMMUNITY): Payer: Self-pay | Admitting: Emergency Medicine

## 2015-04-29 ENCOUNTER — Emergency Department (HOSPITAL_COMMUNITY): Payer: Medicare Other

## 2015-04-29 ENCOUNTER — Inpatient Hospital Stay (HOSPITAL_COMMUNITY)
Admission: EM | Admit: 2015-04-29 | Discharge: 2015-05-03 | DRG: 065 | Disposition: A | Discharge status: Home Health | Payer: Medicare Other | Attending: Internal Medicine | Admitting: Internal Medicine

## 2015-04-29 DIAGNOSIS — Z955 Presence of coronary angioplasty implant and graft: Secondary | ICD-10-CM | POA: Diagnosis not present

## 2015-04-29 DIAGNOSIS — Z833 Family history of diabetes mellitus: Secondary | ICD-10-CM

## 2015-04-29 DIAGNOSIS — R4701 Aphasia: Secondary | ICD-10-CM | POA: Diagnosis not present

## 2015-04-29 DIAGNOSIS — E785 Hyperlipidemia, unspecified: Secondary | ICD-10-CM | POA: Diagnosis present

## 2015-04-29 DIAGNOSIS — Z90710 Acquired absence of both cervix and uterus: Secondary | ICD-10-CM | POA: Diagnosis not present

## 2015-04-29 DIAGNOSIS — R4781 Slurred speech: Secondary | ICD-10-CM | POA: Diagnosis not present

## 2015-04-29 DIAGNOSIS — I472 Ventricular tachycardia: Secondary | ICD-10-CM

## 2015-04-29 DIAGNOSIS — R29810 Facial weakness: Secondary | ICD-10-CM | POA: Diagnosis not present

## 2015-04-29 DIAGNOSIS — I2510 Atherosclerotic heart disease of native coronary artery without angina pectoris: Secondary | ICD-10-CM | POA: Diagnosis present

## 2015-04-29 DIAGNOSIS — Z95810 Presence of automatic (implantable) cardiac defibrillator: Secondary | ICD-10-CM | POA: Diagnosis not present

## 2015-04-29 DIAGNOSIS — I252 Old myocardial infarction: Secondary | ICD-10-CM

## 2015-04-29 DIAGNOSIS — Z8249 Family history of ischemic heart disease and other diseases of the circulatory system: Secondary | ICD-10-CM | POA: Diagnosis not present

## 2015-04-29 DIAGNOSIS — I10 Essential (primary) hypertension: Secondary | ICD-10-CM | POA: Diagnosis present

## 2015-04-29 DIAGNOSIS — I639 Cerebral infarction, unspecified: Principal | ICD-10-CM

## 2015-04-29 DIAGNOSIS — E780 Pure hypercholesterolemia: Secondary | ICD-10-CM | POA: Diagnosis present

## 2015-04-29 DIAGNOSIS — G459 Transient cerebral ischemic attack, unspecified: Secondary | ICD-10-CM | POA: Diagnosis not present

## 2015-04-29 DIAGNOSIS — I5023 Acute on chronic systolic (congestive) heart failure: Secondary | ICD-10-CM | POA: Diagnosis not present

## 2015-04-29 DIAGNOSIS — F17210 Nicotine dependence, cigarettes, uncomplicated: Secondary | ICD-10-CM | POA: Diagnosis present

## 2015-04-29 DIAGNOSIS — I255 Ischemic cardiomyopathy: Secondary | ICD-10-CM | POA: Diagnosis not present

## 2015-04-29 DIAGNOSIS — R4702 Dysphasia: Secondary | ICD-10-CM | POA: Diagnosis present

## 2015-04-29 DIAGNOSIS — I4729 Other ventricular tachycardia: Secondary | ICD-10-CM

## 2015-04-29 DIAGNOSIS — I5022 Chronic systolic (congestive) heart failure: Secondary | ICD-10-CM | POA: Diagnosis not present

## 2015-04-29 DIAGNOSIS — I6523 Occlusion and stenosis of bilateral carotid arteries: Secondary | ICD-10-CM | POA: Diagnosis not present

## 2015-04-29 HISTORY — DX: ST elevation (STEMI) myocardial infarction of unspecified site: I21.3

## 2015-04-29 LAB — COMPREHENSIVE METABOLIC PANEL
ALT: 11 U/L — ABNORMAL LOW (ref 14–54)
ANION GAP: 8 (ref 5–15)
AST: 17 U/L (ref 15–41)
Albumin: 3.6 g/dL (ref 3.5–5.0)
Alkaline Phosphatase: 74 U/L (ref 38–126)
BILIRUBIN TOTAL: 0.8 mg/dL (ref 0.3–1.2)
BUN: 10 mg/dL (ref 6–20)
CHLORIDE: 105 mmol/L (ref 101–111)
CO2: 22 mmol/L (ref 22–32)
Calcium: 9 mg/dL (ref 8.9–10.3)
Creatinine, Ser: 0.9 mg/dL (ref 0.44–1.00)
GFR calc Af Amer: >60 mL/min (ref >60)
GLUCOSE: 107 mg/dL — AB (ref 65–99)
POTASSIUM: 3.7 mmol/L (ref 3.5–5.1)
SODIUM: 135 mmol/L (ref 135–145)
Total Protein: 6.7 g/dL (ref 6.5–8.1)

## 2015-04-29 LAB — DIFFERENTIAL
BASOS PCT: 0 % (ref 0–1)
Basophils Absolute: 0 10*3/uL (ref 0.0–0.1)
EOS PCT: 2 % (ref 0–5)
Eosinophils Absolute: 0.2 10*3/uL (ref 0.0–0.7)
Lymphocytes Relative: 29 % (ref 12–46)
Lymphs Abs: 2.6 10*3/uL (ref 0.7–4.0)
MONOS PCT: 10 % (ref 3–12)
Monocytes Absolute: 0.9 10*3/uL (ref 0.1–1.0)
NEUTROS PCT: 59 % (ref 43–77)
Neutro Abs: 5.1 10*3/uL (ref 1.7–7.7)

## 2015-04-29 LAB — URINE MICROSCOPIC-ADD ON

## 2015-04-29 LAB — URINALYSIS, ROUTINE W REFLEX MICROSCOPIC
Bilirubin Urine: NEGATIVE
GLUCOSE, UA: NEGATIVE mg/dL
Ketones, ur: NEGATIVE mg/dL
Leukocytes, UA: NEGATIVE
Nitrite: NEGATIVE
PH: 6 (ref 5.0–8.0)
PROTEIN: NEGATIVE mg/dL
Specific Gravity, Urine: <1.005 — ABNORMAL LOW (ref 1.005–1.030)
Urobilinogen, UA: 0.2 mg/dL (ref 0.0–1.0)

## 2015-04-29 LAB — CBC
HCT: 38.2 % (ref 36.0–46.0)
Hemoglobin: 12.7 g/dL (ref 12.0–15.0)
MCH: 29.9 pg (ref 26.0–34.0)
MCHC: 33.2 g/dL (ref 30.0–36.0)
MCV: 89.9 fL (ref 78.0–100.0)
Platelets: 180 10*3/uL (ref 150–400)
RBC: 4.25 MIL/uL (ref 3.87–5.11)
RDW: 13.9 % (ref 11.5–15.5)
WBC: 8.7 10*3/uL (ref 4.0–10.5)

## 2015-04-29 LAB — RAPID URINE DRUG SCREEN, HOSP PERFORMED
Amphetamines: NOT DETECTED
Barbiturates: NOT DETECTED
Benzodiazepines: NOT DETECTED
Cocaine: NOT DETECTED
Opiates: NOT DETECTED
TETRAHYDROCANNABINOL: NOT DETECTED

## 2015-04-29 LAB — PROTIME-INR
INR: 1.15 (ref 0.00–1.49)
Prothrombin Time: 14.9 seconds (ref 11.6–15.2)

## 2015-04-29 LAB — ETHANOL: Alcohol, Ethyl (B): 5 mg/dL — ABNORMAL HIGH (ref <5)

## 2015-04-29 LAB — APTT: aPTT: 58 seconds — ABNORMAL HIGH (ref 24–37)

## 2015-04-29 MED ORDER — VITAMIN D3 50 MCG (2000 UT) PO CAPS: 2000.0000 [IU] | ORAL_CAPSULE | Freq: Every day | ORAL | Status: DC

## 2015-04-29 MED ORDER — CLOPIDOGREL BISULFATE 75 MG PO TABS
75.0000 mg | ORAL_TABLET | Freq: Every day | ORAL | Status: DC
  Administered 2015-04-30 – 2015-05-03 (×4): 75 mg via ORAL
  Filled 2015-04-29 (×4): qty 1

## 2015-04-29 MED ORDER — ENOXAPARIN SODIUM 40 MG/0.4ML ~~LOC~~ SOLN
40.0000 mg | SUBCUTANEOUS | Status: DC
  Administered 2015-04-29 – 2015-05-03 (×4): 40 mg via SUBCUTANEOUS
  Filled 2015-04-29 (×4): qty 0.4

## 2015-04-29 MED ORDER — ACETAMINOPHEN 325 MG PO TABS: 650.0000 mg | ORAL_TABLET | ORAL | Status: DC | PRN

## 2015-04-29 MED ORDER — PRAVASTATIN SODIUM 40 MG PO TABS
80.0000 mg | ORAL_TABLET | Freq: Every evening | ORAL | Status: DC
  Administered 2015-04-29 – 2015-05-02 (×4): 80 mg via ORAL
  Filled 2015-04-29 (×4): qty 2

## 2015-04-29 MED ORDER — OMEGA-3-ACID ETHYL ESTERS 1 G PO CAPS
2.0000 g | ORAL_CAPSULE | Freq: Two times a day (BID) | ORAL | Status: DC
  Administered 2015-04-29 – 2015-05-03 (×8): 2 g via ORAL
  Filled 2015-04-29 (×8): qty 2

## 2015-04-29 MED ORDER — ASPIRIN EC 81 MG PO TBEC
81.0000 mg | DELAYED_RELEASE_TABLET | Freq: Every day | ORAL | Status: DC
  Administered 2015-04-29 – 2015-05-03 (×5): 81 mg via ORAL
  Filled 2015-04-29 (×5): qty 1

## 2015-04-29 MED ORDER — CARVEDILOL 12.5 MG PO TABS
12.5000 mg | ORAL_TABLET | Freq: Two times a day (BID) | ORAL | Status: DC
  Administered 2015-04-29 – 2015-05-03 (×8): 12.5 mg via ORAL
  Filled 2015-04-29 (×8): qty 1

## 2015-04-29 MED ORDER — SODIUM CHLORIDE 0.9 % IV SOLN
INTRAVENOUS | Status: DC
  Administered 2015-04-29: 23:00:00 via INTRAVENOUS

## 2015-04-29 MED ORDER — STROKE: EARLY STAGES OF RECOVERY BOOK
Freq: Once | Status: AC
  Administered 2015-05-01: 10:00:00
  Filled 2015-04-29: qty 1

## 2015-04-29 MED ORDER — VITAMIN D 1000 UNITS PO TABS
2000.0000 [IU] | ORAL_TABLET | Freq: Every day | ORAL | Status: DC
  Administered 2015-04-29 – 2015-05-03 (×5): 2000 [IU] via ORAL
  Filled 2015-04-29 (×5): qty 2

## 2015-04-29 NOTE — H&P (Signed)
[facility] History and Physical  Nicole Clements:[id number] DOB: [birth year]    PCP:   Lupita Raider, MD   Chief Complaint: aphasia.   HPI: Nicole Clements is an 74 y.o. 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  Family noted she had some facial droop as well.  In the ER her speech was better and her facial droop improved.  She had been taking her DAPT (plavix and ASA), and hadn't missed her Plavix at all.  She denied chest pain, HA, and has been able to ambulate.  ER work up included EKG with NSR and RBBB, head CT showed no acute infarct, and renal fx tests were normal with normal CBC.  Hospitalist was asked to admit her for TIA/CVA work up.  Rewiew of Systems:  Constitutional: Negative for malaise, fever and chills. No significant weight loss or weight gain Eyes: Negative for eye pain, redness and discharge, diplopia, visual changes, or flashes of light. ENMT: Negative for ear pain, hoarseness, nasal congestion, sinus pressure and sore throat. No headaches; tinnitus, drooling, or problem swallowing. Cardiovascular: Negative for chest pain, palpitations, diaphoresis, dyspnea and peripheral edema. ; No orthopnea, PND Respiratory: Negative for cough, hemoptysis, wheezing and stridor. No pleuritic chestpain. Gastrointestinal: Negative for nausea, vomiting, diarrhea, constipation, abdominal pain, melena, blood in stool, hematemesis, jaundice and rectal bleeding.    Genitourinary: Negative for frequency, dysuria, incontinence,flank pain and hematuria; Musculoskeletal: Negative for back pain and neck pain. Negative for swelling and trauma.;  Skin: . Negative for pruritus, rash, abrasions, bruising and skin lesion.; ulcerations Neuro:  Negative for headache, lightheadedness and neck stiffness. Negative for weakness, altered level of consciousness , altered mental status, extremity weakness, burning feet, involuntary movement, seizure and syncope.  Psych: negative for anxiety, depression, insomnia, tearfulness, panic attacks, hallucinations, paranoia, suicidal or homicidal ideation   Past Medical History  Diagnosis Date  . Myocardial infarct 07/22/1996    st.elevated inferior wall infarct  . CAD (coronary artery disease) April 2014    RCA disease- Med Rx   . Hypertension   . Hypercholesterolemia   . S/P colonoscopy August 2007    hyperplastic polyps, rare sigmoid and descending colon diverticulosis, small internal hemorrhoids  . CHF (congestive heart failure) 12/31/2012  . Ischemic cardiomyopathy     EF 25%-45%  . PVC's (premature ventricular contractions)   . IVCD (intraventricular conduction defect)   . S/P coronary artery stent placement DES-PLA of RCA and occlusion of PDA 07/17/14 07/18/2014    Past Surgical History  Procedure Laterality Date  . Cardiac catheterization  April 2014    Med Rx  . S/p hysterectomy    . Left and right heart catheterization with coronary angiogram N/A 07/17/2014    Procedure: LEFT AND RIGHT HEART CATHETERIZATION WITH CORONARY ANGIOGRAM;  Surgeon: Micheline Chapman, MD;  Location: [facility] CATH LAB;  Service: Cardiovascular;  Laterality: N/A;  . Percutaneous coronary stent intervention (pci-s)  07/17/2014    Procedure: PERCUTANEOUS CORONARY STENT INTERVENTION (PCI-S);  Surgeon: Micheline Chapman, MD;  Location: [facility] CATH LAB;  Service: Cardiovascular;;  Distal RCA  . Ep implantable device  04/18/2015    BV ICD  . Ep implantable device N/A 04/18/2015    Procedure: BiV ICD Insertion CRT-D;  Surgeon: Duke Salvia, MD;  Location: [facility] INVASIVE CV LAB;  Service: Cardiovascular;  Laterality: N/A;    Medications:  HOME MEDS: Prior to Admission medications   Medication Sig Start Date End Date Taking?  Authorizing Provider  acetaminophen (TYLENOL) 325 MG tablet Take 2 tablets (650 mg total) by mouth every 4 (four) hours as needed for headache or mild pain. 07/19/14  Yes Abelino Derrick, PA-C  aspirin EC 81 MG EC tablet Take 1 tablet (81 mg total) by mouth daily. 07/19/14  Yes Luke K Kilroy, PA-C  carvedilol (COREG) 25 MG tablet Take 0.5 tablets (12.5 mg total) by mouth 2 (two) times daily. 03/29/15  Yes Duke Salvia, MD  Cholecalciferol (VITAMIN D3) 2000 UNITS capsule Take 2,000 Units by mouth daily.     Yes Historical Provider, MD  clopidogrel (PLAVIX) 75 MG tablet Take 1 tablet (75 mg total) by mouth daily with breakfast. 07/19/14  Yes Abelino Derrick, PA-C  furosemide (LASIX) 80 MG tablet Take 1 tablet (80 mg total) by mouth daily. 07/19/14  Yes Luke K Kilroy, PA-C  losartan (COZAAR) 25 MG tablet Take 1 tablet (25 mg total) by mouth daily. 04/06/15  Yes Antoine Poche, MD  nitroGLYCERIN (NITROSTAT) 0.4 MG SL tablet Place 1 tablet (0.4 mg total) under the tongue every 5 (five) minutes x 3 doses as needed for chest pain. 07/19/14  Yes Abelino Derrick, PA-C  Omega-3 Fatty Acids (FISH OIL) 1000 MG CAPS Take 1,000 mg by mouth daily.    Yes Historical Provider, MD  potassium chloride SA (KLOR-CON M20) 20 MEQ tablet Take 1 tablet (20 mEq total) by mouth daily. 12/07/14  Yes Duke Salvia, MD  pravastatin (PRAVACHOL) 80 MG tablet Take 1 tablet (80 mg total) by mouth every evening. 09/13/14  Yes Antoine Poche, MD  spironolactone (ALDACTONE) 25 MG tablet Take 12.5 mg by mouth 2 (two) times daily.   Yes Historical Provider, MD     Allergies:  Allergies  Allergen Reactions  . Lactose Intolerance (Gi) Other (See Comments)    GI Upset   . Sulfa Antibiotics Rash    Social History:   reports that she has been smoking Cigarettes.  She started smoking about 53 years ago. She has a 25 pack-year smoking history. She has never used smokeless tobacco. She reports that she does not drink alcohol or use illicit  drugs.  Family History: Family History  Problem Relation Age of Onset  . Colon cancer Neg Hx   . Hypertension Sister   . Diabetes Mellitus II Sister   . Hypertension Brother   . Diabetes Mellitus II Brother   . Hypertension Brother   . Diabetes Mellitus II Brother   . Hypertension Brother   . Diabetes Mellitus II Brother      Physical Exam: Filed Vitals:   04/29/15 1749 04/29/15 1800 04/29/15 1815 04/29/15 1915  BP: 115/80 117/64  142/65  Pulse: 72  69 25  Temp:      Resp: 20 21 18 20   SpO2: 94%  95% 94%   Blood pressure 142/65, pulse 25, temperature 98.2 F (36.8 C), resp. rate 20, SpO2 94 %.  GEN:  Pleasant  patient lying in the stretcher in no acute distress; cooperative with exam. PSYCH:  alert and oriented x4; does not appear anxious or depressed; affect is appropriate. HEENT: Mucous membranes pink and anicteric; PERRLA; EOM intact; no cervical lymphadenopathy nor thyromegaly or carotid bruit; no JVD; There were no stridor. Neck  is very supple. Breasts:: Not examined CHEST WALL: No tenderness. S/p defibrillator placement. No evidence of infection.  CHEST: Normal respiration, clear to auscultation bilaterally.  HEART: Regular rate and rhythm.  There are no murmur, rub, or gallops.   BACK: No kyphosis or scoliosis; no CVA tenderness ABDOMEN: soft and non-tender; no masses, no organomegaly, normal abdominal bowel sounds; no pannus; no intertriginous candida. There is no rebound and no distention. Rectal Exam: Not done EXTREMITIES: No bone or joint deformity; age-appropriate arthropathy of the hands and knees; no edema; no ulcerations.  There is no calf tenderness. Genitalia: not examined PULSES: 2+ and symmetric SKIN: Normal hydration no rash or ulceration CNS: Cranial nerves 2-12 grossly intact no focal lateralizing neurologic deficit.  Speech is fluent; uvula elevated with phonation, facial symmetry and tongue midline. DTR are normal bilaterally, cerebella exam is  intact, barbinski is negative and strengths are equaled bilaterally.  No sensory loss.   Labs on Admission:  Basic Metabolic Panel:  Recent Labs Lab 04/29/15 1707  NA 135  K 3.7  CL 105  CO2 22  GLUCOSE 107*  BUN 10  CREATININE 0.90  CALCIUM 9.0   Liver Function Tests:  Recent Labs Lab 04/29/15 1707  AST 17  ALT 11*  ALKPHOS 74  BILITOT 0.8  PROT 6.7  ALBUMIN 3.6   No results for input(s): LIPASE, AMYLASE in the last 168 hours. No results for input(s): AMMONIA in the last 168 hours. CBC:  Recent Labs Lab 04/29/15 1707  WBC 8.7  NEUTROABS 5.1  HGB 12.7  HCT 38.2  MCV 89.9  PLT 180    Radiological Exams on Admission: Ct Head Wo Contrast  04/29/2015   CLINICAL DATA:  Aphasia.  Slurred speech.  Right facial droop.  EXAM: CT HEAD WITHOUT CONTRAST  TECHNIQUE: Contiguous axial images were obtained from the base of the skull through the vertex without intravenous contrast.  COMPARISON:  None.  FINDINGS: No intracranial hemorrhage, mass effect, or midline shift. No hydrocephalus. The basilar cisterns are patent. No evidence of territorial infarct. No intracranial fluid collection. Calvarium is intact. Included paranasal sinuses and mastoid air cells are well aerated.  IMPRESSION: No acute intracranial abnormality on noncontrast head CT.   Electronically Signed   By: Rubye Oaks M.D.   On: 04/29/2015 17:47    EKG: Independently reviewed.    Assessment/Plan Present on Admission:  . Brain TIA . Acute on chronic systolic heart failure . CAD- remote MI '90s, distal RCA DES 07/17/14  PLAN:  Will admit her for stroke work up.  She will need to continue on her DAPT with ASA and Plavix.  Since she has defibrillator placement, will obtain CTA of her brain instead of MRI or MRA.  Will obtain ECHO and Carotid duplex.  Will consult neurology tomorrow.   She is stable, full code, and will be admitted to telemetry under [facility] service.    Other plans as per orders.  Code  Status: FULL Unk Lightning, MD. [facility] Pager [phone] 7pm to 7am.  04/29/2015, 7:24 PM

## 2015-04-29 NOTE — ED Notes (Signed)
Per family patient did not come to church which is not like her, so family started to call patient with no answer, when daughter went to house patient noted to have slurred speech with difficulty forming comprehensible sentences. Per niece patient seems to have right side facial drooping. Denies any weakness, patient ambulating and using arms.

## 2015-04-29 NOTE — ED Provider Notes (Signed)
CSN: [id number]     Arrival date & time 04/29/15  1641 History   First MD Initiated Contact with Patient 04/29/15 1647     Chief Complaint  Patient presents with  . Aphasia     (Consider location/radiation/quality/duration/timing/severity/associated sxs/prior Treatment) The history is provided by the patient and a relative. The history is limited by the condition of the patient.   74 year old female brought in by family. Last seen normal based on telephone conversation yesterday at 1:00 in the afternoon. Patient did not show up church which is unusual for her. Family of salt around and discovered that they were getting an answer. When patient's daughter went to the house noted to have slurred speech and difficulty forming sentences. Also felt that there was a right-sided facial droop. Patient able to ambulate fine.  Patient just recently had defibrillator placed the 2 days ago by Dr. Graciela Husbands. Patient is on Plavix on any anti-coagulation otherwise.  Level V caveat applies to the history all obtained from family members.  Past Medical History  Diagnosis Date  . Myocardial infarct 07/22/1996    st.elevated inferior wall infarct  . CAD (coronary artery disease) April 2014    RCA disease- Med Rx   . Hypertension   . Hypercholesterolemia   . S/P colonoscopy August 2007    hyperplastic polyps, rare sigmoid and descending colon diverticulosis, small internal hemorrhoids  . CHF (congestive heart failure) 12/31/2012  . Ischemic cardiomyopathy     EF 25%-45%  . PVC's (premature ventricular contractions)   . IVCD (intraventricular conduction defect)   . S/P coronary artery stent placement DES-PLA of RCA and occlusion of PDA 07/17/14 07/18/2014   Past Surgical History  Procedure Laterality Date  . Cardiac catheterization  April 2014    Med Rx  . S/p hysterectomy    . Left and right heart catheterization with coronary angiogram N/A 07/17/2014    Procedure: LEFT AND RIGHT HEART CATHETERIZATION WITH  CORONARY ANGIOGRAM;  Surgeon: Micheline Chapman, MD;  Location: [facility] CATH LAB;  Service: Cardiovascular;  Laterality: N/A;  . Percutaneous coronary stent intervention (pci-s)  07/17/2014    Procedure: PERCUTANEOUS CORONARY STENT INTERVENTION (PCI-S);  Surgeon: Micheline Chapman, MD;  Location: [facility] CATH LAB;  Service: Cardiovascular;;  Distal RCA  . Ep implantable device  04/18/2015    BV ICD  . Ep implantable device N/A 04/18/2015    Procedure: BiV ICD Insertion CRT-D;  Surgeon: Duke Salvia, MD;  Location: [facility] INVASIVE CV LAB;  Service: Cardiovascular;  Laterality: N/A;   Family History  Problem Relation Age of Onset  . Colon cancer Neg Hx   . Hypertension Sister   . Diabetes Mellitus II Sister   . Hypertension Brother   . Diabetes Mellitus II Brother   . Hypertension Brother   . Diabetes Mellitus II Brother   . Hypertension Brother   . Diabetes Mellitus II Brother    History  Substance Use Topics  . Smoking status: Current Every Day Smoker -- 0.50 packs/day for 50 years    Types: Cigarettes    Start date: 02/26/1962  . Smokeless tobacco: Never Used     Comment: down to a half a pack daily for the past month   . Alcohol Use: No   OB History    No data available     Review of Systems  Unable to perform ROS  level V caveat applies due to the patient's speech difficulty.    Allergies  Sulfa antibiotics  Home  Medications   Prior to Admission medications   Medication Sig Start Date End Date Taking? Authorizing Provider  acetaminophen (TYLENOL) 325 MG tablet Take 2 tablets (650 mg total) by mouth every 4 (four) hours as needed for headache or mild pain. 07/19/14   Abelino Derrick, PA-C  aspirin EC 81 MG EC tablet Take 1 tablet (81 mg total) by mouth daily. 07/19/14   Abelino Derrick, PA-C  carvedilol (COREG) 25 MG tablet Take 0.5 tablets (12.5 mg total) by mouth 2 (two) times daily. 03/29/15   Duke Salvia, MD  Cholecalciferol (VITAMIN D3) 2000 UNITS capsule Take 2,000 Units by mouth  daily.      Historical Provider, MD  clopidogrel (PLAVIX) 75 MG tablet Take 1 tablet (75 mg total) by mouth daily with breakfast. 07/19/14   Abelino Derrick, PA-C  furosemide (LASIX) 80 MG tablet Take 1 tablet (80 mg total) by mouth daily. 07/19/14   Abelino Derrick, PA-C  losartan (COZAAR) 25 MG tablet Take 1 tablet (25 mg total) by mouth daily. 04/06/15   Antoine Poche, MD  nitroGLYCERIN (NITROSTAT) 0.4 MG SL tablet Place 1 tablet (0.4 mg total) under the tongue every 5 (five) minutes x 3 doses as needed for chest pain. 07/19/14   Abelino Derrick, PA-C  Omega-3 Fatty Acids (FISH OIL) 1000 MG CAPS Take 1,000 mg by mouth daily.     Historical Provider, MD  potassium chloride SA (KLOR-CON M20) 20 MEQ tablet Take 1 tablet (20 mEq total) by mouth daily. 12/07/14   Duke Salvia, MD  pravastatin (PRAVACHOL) 80 MG tablet Take 1 tablet (80 mg total) by mouth every evening. 09/13/14   Antoine Poche, MD  spironolactone (ALDACTONE) 25 MG tablet Take 12.5 mg by mouth 2 (two) times daily.    Historical Provider, MD   BP 115/80 mmHg  Pulse 72  Temp(Src) 98.2 F (36.8 C)  Resp 20  SpO2 94% Physical Exam  Constitutional: She appears well-developed and well-nourished.  HENT:  Head: Normocephalic and atraumatic.  Mouth/Throat: Oropharynx is clear and moist.  Eyes: Pupils are equal, round, and reactive to light.  Neck: Normal range of motion. Neck supple.  Cardiovascular: Normal rate, regular rhythm and normal heart sounds.   No murmur heard. Pulmonary/Chest: Effort normal and breath sounds normal. No respiratory distress. She has no wheezes.  Abdominal: Bowel sounds are normal. There is no tenderness.  Musculoskeletal: Normal range of motion. She exhibits no edema.  Neurological: She is alert. A cranial nerve deficit is present. She exhibits normal muscle tone. Coordination normal.  Patient without slurred speech. Patient will follow commands. However is using inappropriate words when trying to answer  questions. Moreover word salad.  Skin: Skin is warm.  Nursing note and vitals reviewed.   ED Course  Procedures (including critical care time) Labs Review Labs Reviewed  ETHANOL - Abnormal; Notable for the following:    Alcohol, Ethyl (B) 5 (*)    All other components within normal limits  APTT - Abnormal; Notable for the following:    aPTT 58 (*)    All other components within normal limits  COMPREHENSIVE METABOLIC PANEL - Abnormal; Notable for the following:    Glucose, Bld 107 (*)    ALT 11 (*)    All other components within normal limits  PROTIME-INR  CBC  DIFFERENTIAL  URINE RAPID DRUG SCREEN, HOSP PERFORMED  URINALYSIS, ROUTINE W REFLEX MICROSCOPIC (NOT AT [facility])  I-STAT CHEM 8, ED  I-STAT TROPOININ,  ED   Results for orders placed or performed during the hospital encounter of 04/29/15  Ethanol  Result Value Ref Range   Alcohol, Ethyl (B) 5 (H) <5 mg/dL  Protime-INR  Result Value Ref Range   Prothrombin Time 14.9 11.6 - 15.2 seconds   INR 1.15 0.00 - 1.49  APTT  Result Value Ref Range   aPTT 58 (H) 24 - 37 seconds  CBC  Result Value Ref Range   WBC 8.7 4.0 - 10.5 K/uL   RBC 4.25 3.87 - 5.11 MIL/uL   Hemoglobin 12.7 12.0 - 15.0 g/dL   HCT 05.6 97.9 - 48.0 %   MCV 89.9 78.0 - 100.0 fL   MCH 29.9 26.0 - 34.0 pg   MCHC 33.2 30.0 - 36.0 g/dL   RDW 16.5 53.7 - 48.2 %   Platelets 180 150 - 400 K/uL  Differential  Result Value Ref Range   Neutrophils Relative % 59 43 - 77 %   Neutro Abs 5.1 1.7 - 7.7 K/uL   Lymphocytes Relative 29 12 - 46 %   Lymphs Abs 2.6 0.7 - 4.0 K/uL   Monocytes Relative 10 3 - 12 %   Monocytes Absolute 0.9 0.1 - 1.0 K/uL   Eosinophils Relative 2 0 - 5 %   Eosinophils Absolute 0.2 0.0 - 0.7 K/uL   Basophils Relative 0 0 - 1 %   Basophils Absolute 0.0 0.0 - 0.1 K/uL  Comprehensive metabolic panel  Result Value Ref Range   Sodium 135 135 - 145 mmol/L   Potassium 3.7 3.5 - 5.1 mmol/L   Chloride 105 101 - 111 mmol/L   CO2 22 22 - 32  mmol/L   Glucose, Bld 107 (H) 65 - 99 mg/dL   BUN 10 6 - 20 mg/dL   Creatinine, Ser 7.07 0.44 - 1.00 mg/dL   Calcium 9.0 8.9 - 86.7 mg/dL   Total Protein 6.7 6.5 - 8.1 g/dL   Albumin 3.6 3.5 - 5.0 g/dL   AST 17 15 - 41 U/L   ALT 11 (L) 14 - 54 U/L   Alkaline Phosphatase 74 38 - 126 U/L   Total Bilirubin 0.8 0.3 - 1.2 mg/dL   GFR calc non Af Amer >60 >60 mL/min   GFR calc Af Amer >60 >60 mL/min   Anion gap 8 5 - 15     Imaging Review Ct Head Wo Contrast  04/29/2015   CLINICAL DATA:  Aphasia.  Slurred speech.  Right facial droop.  EXAM: CT HEAD WITHOUT CONTRAST  TECHNIQUE: Contiguous axial images were obtained from the base of the skull through the vertex without intravenous contrast.  COMPARISON:  None.  FINDINGS: No intracranial hemorrhage, mass effect, or midline shift. No hydrocephalus. The basilar cisterns are patent. No evidence of territorial infarct. No intracranial fluid collection. Calvarium is intact. Included paranasal sinuses and mastoid air cells are well aerated.  IMPRESSION: No acute intracranial abnormality on noncontrast head CT.   Electronically Signed   By: Rubye Oaks M.D.   On: 04/29/2015 17:47     EKG Interpretation   Date/Time:  Sunday April 29 2015 16:56:18 EDT Ventricular Rate:  70 PR Interval:  165 QRS Duration: 137 QT Interval:  448 QTC Calculation: 483 R Axis:   -77 Text Interpretation:  Sinus rhythm Right bundle branch block LVH with  secondary repolarization abnormality Probable inferior infarct, recent  Anterolateral infarct, age indeterminate Baseline wander in lead(s) V2 AV  SEQUENTIAL PACEMAKER Confirmed by Deretha Emory  MD, Alyana Kreiter (  [id number]) on  04/29/2015 5:07:41 PM      CRITICAL CARE Performed by: Vanetta Mulders Total critical care time: 30 Critical care time was exclusive of separately billable procedures and treating other patients. Critical care was necessary to treat or prevent imminent or life-threatening deterioration. Critical care  was time spent personally by me on the following activities: development of treatment plan with patient and/or surrogate as well as nursing, discussions with consultants, evaluation of patient's response to treatment, examination of patient, obtaining history from patient or surrogate, ordering and performing treatments and interventions, ordering and review of laboratory studies, ordering and review of radiographic studies, pulse oximetry and re-evaluation of patient's condition.    MDM   Final diagnoses:  Cerebral infarction due to unspecified mechanism    Patient last seen normal was at 1:00 yesterday afternoon. Patient is covered by friends and family to having trouble speaking with slurred speech.  Patient was kind of speaking with the words that didn't make sense. The patient's speech is now improving significantly. Almost getting complete sentences out without the wrong words coming out. There was no other focal neuro deficits. No complaint of headache.  Head CT is negative. But clinically this is consistent with a CVA. MRI not available. Will require admission.    Vanetta Mulders, MD 04/29/15 2045

## 2015-04-30 ENCOUNTER — Inpatient Hospital Stay (HOSPITAL_COMMUNITY): Payer: Medicare Other

## 2015-04-30 ENCOUNTER — Ambulatory Visit: Payer: PRIVATE HEALTH INSURANCE

## 2015-04-30 DIAGNOSIS — G459 Transient cerebral ischemic attack, unspecified: Secondary | ICD-10-CM

## 2015-04-30 DIAGNOSIS — I639 Cerebral infarction, unspecified: Secondary | ICD-10-CM

## 2015-04-30 DIAGNOSIS — R4701 Aphasia: Secondary | ICD-10-CM

## 2015-04-30 LAB — GLUCOSE, CAPILLARY
Glucose-Capillary: 134 mg/dL — ABNORMAL HIGH (ref 65–99)
Glucose-Capillary: 84 mg/dL (ref 65–99)
Glucose-Capillary: 96 mg/dL (ref 65–99)
Glucose-Capillary: 76 mg/dL (ref 65–99)

## 2015-04-30 LAB — LIPID PANEL
CHOL/HDL RATIO: 4.3 ratio
Cholesterol: 138 mg/dL (ref 0–200)
HDL: 32 mg/dL — AB (ref >40)
LDL Cholesterol: 83 mg/dL (ref 0–99)
TRIGLYCERIDES: 117 mg/dL (ref <150)
VLDL: 23 mg/dL (ref 0–40)

## 2015-04-30 LAB — TROPONIN I

## 2015-04-30 MED ORDER — IOHEXOL 350 MG/ML SOLN
100.0000 mL | Freq: Once | INTRAVENOUS | Status: AC | PRN
  Administered 2015-04-30: 75 mL via INTRAVENOUS

## 2015-04-30 MED ORDER — SODIUM CHLORIDE 0.9 % IJ SOLN
INTRAMUSCULAR | Status: AC
  Filled 2015-04-30: qty 500

## 2015-04-30 NOTE — Evaluation (Signed)
Speech Language Pathology Evaluation Patient Details Name: Nicole Clements MRN: [id number] DOB: January 16, 1941 Today's Date: 04/30/2015 Time: 4008-6761 SLP Time Calculation (min) (ACUTE ONLY): 44 min  Problem List:  Patient Active Problem List   Diagnosis Date Noted  . Stroke 04/30/2015  . Expressive aphasia 04/30/2015  . Ventricular tachycardia, non-sustained 04/29/2015  . Chronic systolic CHF (congestive heart failure), NYHA class 3 04/18/2015  . S/P coronary artery stent placement DES-PLA of RCA and occlusion of PDA 07/17/14 07/18/2014  . Acute respiratory failure 07/12/2014  . 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  . ICM- new LVD 20-25% 07/13/14 02/21/2013  . Acute respiratory failure with hypoxia- Bi Pap per EMS 12/31/2012  . Acute systolic congestive heart failure 12/31/2012  . Hx of colonoscopy with polypectomy 07/17/2011  . DYSLIPIDEMIA 01/11/2010  . TOBACCO USER 01/11/2010  . 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  . CAD- remote MI '90s, distal RCA DES 07/17/14 01/11/2010   Past Medical History:  Past Medical History  Diagnosis Date  . Myocardial infarct 07/22/1996    st.elevated inferior wall infarct  . CAD (coronary artery disease) April 2014    RCA disease- Med Rx   . Hypertension   . Hypercholesterolemia   . S/P colonoscopy August 2007    hyperplastic polyps, rare sigmoid and descending colon diverticulosis, small internal hemorrhoids  . CHF (congestive heart failure) 12/31/2012  . Ischemic cardiomyopathy     EF 25%-45%  . PVC's (premature ventricular contractions)   . IVCD (intraventricular conduction defect)   . S/P coronary artery stent placement DES-PLA of RCA and occlusion of PDA 07/17/14 07/18/2014   Past Surgical History:  Past Surgical History  Procedure Laterality Date  . Cardiac catheterization  April 2014    Med Rx  . S/p hysterectomy    . Left and right heart catheterization with coronary angiogram N/A 07/17/2014    Procedure: LEFT AND RIGHT HEART CATHETERIZATION WITH  CORONARY ANGIOGRAM;  Surgeon: Micheline Chapman, MD;  Location: [facility] CATH LAB;  Service: Cardiovascular;  Laterality: N/A;  . Percutaneous coronary stent intervention (pci-s)  07/17/2014    Procedure: PERCUTANEOUS CORONARY STENT INTERVENTION (PCI-S);  Surgeon: Micheline Chapman, MD;  Location: [facility] CATH LAB;  Service: Cardiovascular;;  Distal RCA  . Ep implantable device  04/18/2015    BV ICD  . Ep implantable device N/A 04/18/2015    Procedure: BiV ICD Insertion CRT-D;  Surgeon: Duke Salvia, MD;  Location: [facility] INVASIVE CV LAB;  Service: Cardiovascular;  Laterality: N/A;   HPI:  Nicole Clements is a 74 year old woman with a past medical history of ischemic cardiomyopathy, coronary artery disease with stent placement, ICD placement 6/16 presented to the emergency department with history of expressive aphasia which was confirmed on examination in the emergency department, no other focal deficits were noted. Not a candidate for TPA secondary to delay in presentation. Compliant with aspirin Plavix. CT head was negative. She was referred for stroke evaluation. MRI not obtainable secondary to defibrillator placement. Neurologist notes "Acute infarct involving the left operculum temporal region. Given the patient's cardiac history and the unremarkable imaging of the intracranial and extracranial vessels, I am concerned that the patient likely has a cardioembolic phenomenon. SLP asked to evaluate pt as part of stroke protocol. Pt passed RN swallow screen, but does have expressive aphasia.   Assessment / Plan / Recommendation Clinical Impression  Ms. Prokop presents with moderate expressive aphasia and mild receptive aphasia characterized by decreased word finding abilities, decreased confrontation and responsive naming, paraphasic errors (  phonemic and neologisms with some awareness), attempts at self correcting, and some difficulty following multistep commands and answering complex yes/no questions. Her comprehension  may appear worse than it actually is due to expressive deficits. She had difficulty with confrontation naming tasks, frequently stating "I know it, but I can't say it". She was able to read short sentences aloud with 80% acc, but had difficulty with word repetition tasks. Ms. Odonnel initially seemed annoyed with my visit and questions, however she immediately softened and asked appropriate questions when I explained what she was experiencing (aphasia). She tells me that she lives alone and was completely independent PTA (driving, going to church, paying bills, etc). Her daughter lives out of state, but she has siblings and friends that live nearby. She understands that she will need speech therapy, but is wary about having people come to her home. She would be a good candidate for outpatient therapy if she has transportation. She may be able to drive, but would initially recommend supervision and support in the home which she verbally agreed with. Recommend follow up SLP intervention to maximize recovery in expressive and receptive language skills, improve quality of life, and decrease burden of care.     SLP Assessment  Patient needs continued Speech Lanaguage Pathology Services    Follow Up Recommendations  Outpatient SLP    Frequency and Duration min 2x/week  1 week   Pertinent Vitals/Pain Pain Assessment: No/denies pain   SLP Goals  Potential to Achieve Goals (ACUTE ONLY): Good  SLP Evaluation Prior Functioning  Cognitive/Linguistic Baseline: Within functional limits Type of Home: House  Lives With: Alone Available Help at Discharge: Friend(s);Available PRN/intermittently;Family Vocation: Retired   IT consultant  Overall Cognitive Status: Difficult to assess (due to language deficits but mostly WFL) Arousal/Alertness: Awake/alert Orientation Level: Oriented to person;Oriented to place Problem Solving: Impaired Problem Solving Impairment: Verbal complex Safety/Judgment: Appears intact     Comprehension  Auditory Comprehension Overall Auditory Comprehension: Impaired Yes/No Questions: Within Functional Limits Commands: Impaired Multistep Basic Commands: 75-100% accurate Other Conversation Comments:  (benefits from repetition and rephrasing prn) EffectiveTechniques: Repetition;Stressing words;Visual/Gestural cues Reading Comprehension Functional Environmental (signs, name badge): Within functional limits Effective Techniques: Eye glasses;Tactile cueing    Expression Expression Primary Mode of Expression: Verbal Verbal Expression Overall Verbal Expression: Impaired Initiation: No impairment Level of Generative/Spontaneous Verbalization: Phrase;Sentence Repetition: Impaired Level of Impairment: Word level Responsive: 51-75% accurate Convergent: Not tested Divergent: Not tested Pragmatics: No impairment Effective Techniques: Phonemic cues;Semantic cues (sentence completion cues) Non-Verbal Means of Communication: Not applicable Written Expression Dominant Hand: Right Written Expression: Not tested   Oral / Motor Oral Motor/Sensory Function Overall Oral Motor/Sensory Function: Appears within functional limits for tasks assessed Motor Speech Overall Motor Speech: Appears within functional limits for tasks assessed Respiration: Within functional limits Phonation: Normal Resonance: Within functional limits Articulation: Within functional limitis Intelligibility: Intelligibility reduced (due to paraphasic errors) Motor Planning: Witnin functional limits   Thank you,  Havery Moros, CCC-SLP [phone]      PORTER,DABNEY 04/30/2015, 6:56 PM

## 2015-04-30 NOTE — Plan of Care (Signed)
Problem: Acute Treatment Outcomes Goal: Neuro exam at baseline or improved Outcome: Progressing Still having some expressive apashia

## 2015-04-30 NOTE — Care Management Note (Signed)
Case Management Note  Patient Details  Name: Nicole Clements MRN: [id number] Date of Birth: 09-Apr-1941  Expected Discharge Date:                  Expected Discharge Plan:  Home/Self Care  In-House Referral:  NA  Discharge planning Services  CM Consult  Post Acute Care Choice:  NA Choice offered to:  NA  DME Arranged:    DME Agency:     HH Arranged:    HH Agency:     Status of Service:  In process, will continue to follow  Medicare Important Message Given:    Date Medicare IM Given:    Medicare IM give by:    Date Additional Medicare IM Given:    Additional Medicare Important Message give by:     If discussed at Long Length of Stay Meetings, dates discussed:    Additional Comments: Pt is from home and independent at baseline. Pt admitted for CVA/TIA workup. Pt has no HH services or DME's prior to admission. MD anticipates need for OP therapy's. CM discussed with patient options for where to get OP therapies. Pt did not want to discuss OP therapy at that time and wasn't sure if she would even want OP therapy. Will discuss options with patient again after all evals have been completed.  Malcolm Metro, RN 04/30/2015, 3:40 PM

## 2015-04-30 NOTE — Consult Note (Signed)
Gray A. Merlene Laughter, MD     [email or web address]          Nicole Clements is an 74 y.o. female.   ASSESSMENT/PLAN: 1. 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 Given the patient's cardiac history and the unremarkable imaging of the intracranial and extracranial vessels, I am concerned that the patient likely has a cardioembolic phenomenon. Consequently, we will try to get a download of the patient's rhythm from her heart monitor/defibrillator. In the meantime, we'll continue with the dual antiplatelet agents of aspirin and Plavix. 2. Ischemic cardiomyopathy. 3. Hypertension. 4. Dyslipidemia.  The patient is a 74 year old black female who probably woke up on Sunday morning with difficulty speaking. Appears that she had a lot of problems finding words and expression of speech. The chart also reports that she had right facial weakness. The patient has marked amount of a facial examination this limits the evaluation. She does not report however having focal numbness, weakness, dizziness or headaches. She recently had a different bed to place the 16th of this month. The patient does not report any chest pain, short of breath, GI GU symptoms. Review of systems is otherwise negative.  GENERAL: This is a pleasant female in no acute distress.  HEENT: Supple. Atraumatic normocephalic.   ABDOMEN: soft  EXTREMITIES: No edema   BACK: Normal.  SKIN: Normal by inspection.    MENTAL STATUS: She is awake and alert. She has a moderate global aphasia with speech expression more affected than comprehension. She does follow commands with prompting due to difficulty with comprehension impairment. She is oriented to place but has difficulty with orientation to time. She is able to name 3/6 objects only. She also has paraphasic errors.   CRANIAL NERVES: Pupils are equal, round and reactive to light and accommodation; extra ocular movements are full, there is no significant  nystagmus; visual fields are full; upper and lower facial muscles are normal in strength and symmetric, there is no flattening of the nasolabial folds; tongue is midline; uvula is midline; shoulder elevation is normal. No extinction to double simultaneous visual stimulation.  MOTOR: Normal tone, bulk and strength; no pronator drift.  COORDINATION: Left finger to nose is normal, right finger to nose is normal, No rest tremor; no intention tremor; no postural tremor; no bradykinesia.  REFLEXES: Deep tendon reflexes are symmetrical and normal.    SENSATION: Normal to light touch.  GAIT: Normal.    Blood pressure 116/55, pulse 69, temperature 98.3 F (36.8 C), temperature source Oral, resp. rate 20, height $RemoveBe'5\' 3"'ewOoZiGVV$  (1.6 m), weight 73.619 kg (162 lb 4.8 oz), SpO2 99 %.  Past Medical History  Diagnosis Date  . Myocardial infarct 07/22/1996    st.elevated inferior wall infarct  . CAD (coronary artery disease) April 2014    RCA disease- Med Rx   . Hypertension   . Hypercholesterolemia   . S/P colonoscopy August 2007    hyperplastic polyps, rare sigmoid and descending colon diverticulosis, small internal hemorrhoids  . CHF (congestive heart failure) 12/31/2012  . Ischemic cardiomyopathy     EF 25%-45%  . PVC's (premature ventricular contractions)   . IVCD (intraventricular conduction defect)   . S/P coronary artery stent placement DES-PLA of RCA and occlusion of PDA 07/17/14 07/18/2014    Past Surgical History  Procedure Laterality Date  . Cardiac catheterization  April 2014    Med Rx  . S/p hysterectomy    . Left and right heart catheterization with coronary angiogram N/A 07/17/2014  Procedure: LEFT AND RIGHT HEART CATHETERIZATION WITH CORONARY ANGIOGRAM;  Surgeon: Blane Ohara, MD;  Location: [facility] CATH LAB;  Service: Cardiovascular;  Laterality: N/A;  . Percutaneous coronary stent intervention (pci-s)  07/17/2014    Procedure: PERCUTANEOUS CORONARY STENT INTERVENTION (PCI-S);   Surgeon: Blane Ohara, MD;  Location: [facility] CATH LAB;  Service: Cardiovascular;;  Distal RCA  . Ep implantable device  04/18/2015    BV ICD  . Ep implantable device N/A 04/18/2015    Procedure: BiV ICD Insertion CRT-D;  Surgeon: Deboraha Sprang, MD;  Location: [place] CV LAB;  Service: Cardiovascular;  Laterality: N/A;    Family History  Problem Relation Age of Onset  . Colon cancer Neg Hx   . Hypertension Sister   . Diabetes Mellitus II Sister   . Hypertension Brother   . Diabetes Mellitus II Brother   . Hypertension Brother   . Diabetes Mellitus II Brother   . Hypertension Brother   . Diabetes Mellitus II Brother     Social History:  reports that she has been smoking Cigarettes.  She started smoking about 53 years ago. She has a 25 pack-year smoking history. She has never used smokeless tobacco. She reports that she does not drink alcohol or use illicit drugs.  Allergies:  Allergies  Allergen Reactions  . Lactose Intolerance (Gi) Other (See Comments)    GI Upset   . Sulfa Antibiotics Rash    Medications: Prior to Admission medications   Medication Sig Start Date End Date Taking? Authorizing Provider  acetaminophen (TYLENOL) 325 MG tablet Take 2 tablets (650 mg total) by mouth every 4 (four) hours as needed for headache or mild pain. 07/19/14  Yes Erlene Quan, PA-C  aspirin EC 81 MG EC tablet Take 1 tablet (81 mg total) by mouth daily. 07/19/14  Yes Luke K Kilroy, PA-C  carvedilol (COREG) 25 MG tablet Take 0.5 tablets (12.5 mg total) by mouth 2 (two) times daily. 03/29/15  Yes Deboraha Sprang, MD  Cholecalciferol (VITAMIN D3) 2000 UNITS capsule Take 2,000 Units by mouth daily.     Yes Historical Provider, MD  clopidogrel (PLAVIX) 75 MG tablet Take 1 tablet (75 mg total) by mouth daily with breakfast. 07/19/14  Yes Erlene Quan, PA-C  furosemide (LASIX) 80 MG tablet Take 1 tablet (80 mg total) by mouth daily. 07/19/14  Yes Luke K Kilroy, PA-C  losartan (COZAAR) 25 MG tablet  Take 1 tablet (25 mg total) by mouth daily. 04/06/15  Yes Arnoldo Lenis, MD  nitroGLYCERIN (NITROSTAT) 0.4 MG SL tablet Place 1 tablet (0.4 mg total) under the tongue every 5 (five) minutes x 3 doses as needed for chest pain. 07/19/14  Yes Erlene Quan, PA-C  Omega-3 Fatty Acids (FISH OIL) 1000 MG CAPS Take 1,000 mg by mouth daily.    Yes Historical Provider, MD  potassium chloride SA (KLOR-CON M20) 20 MEQ tablet Take 1 tablet (20 mEq total) by mouth daily. 12/07/14  Yes Deboraha Sprang, MD  pravastatin (PRAVACHOL) 80 MG tablet Take 1 tablet (80 mg total) by mouth every evening. 09/13/14  Yes Arnoldo Lenis, MD  spironolactone (ALDACTONE) 25 MG tablet Take 12.5 mg by mouth 2 (two) times daily.   Yes Historical Provider, MD    Scheduled Meds: .  stroke: mapping our early stages of recovery book   Does not apply Once  . aspirin EC  81 mg Oral Daily  . carvedilol  12.5 mg Oral BID  .  cholecalciferol  2,000 Units Oral Daily  . clopidogrel  75 mg Oral Q breakfast  . enoxaparin (LOVENOX) injection  40 mg Subcutaneous Q24H  . omega-3 acid ethyl esters  2 g Oral BID  . pravastatin  80 mg Oral QPM  . sodium chloride       Continuous Infusions:  PRN Meds:.acetaminophen     Results for orders placed or performed during the hospital encounter of 04/29/15 (from the past 48 hour(s))  Ethanol     Status: Abnormal   Collection Time: 04/29/15  5:07 PM  Result Value Ref Range   Alcohol, Ethyl (B) 5 (H) <5 mg/dL    Comment:        LOWEST DETECTABLE LIMIT FOR SERUM ALCOHOL IS 5 mg/dL FOR MEDICAL PURPOSES ONLY   Protime-INR     Status: None   Collection Time: 04/29/15  5:07 PM  Result Value Ref Range   Prothrombin Time 14.9 11.6 - 15.2 seconds   INR 1.15 0.00 - 1.49  APTT     Status: Abnormal   Collection Time: 04/29/15  5:07 PM  Result Value Ref Range   aPTT 58 (H) 24 - 37 seconds    Comment:        IF BASELINE aPTT IS ELEVATED, SUGGEST PATIENT RISK ASSESSMENT BE USED TO DETERMINE  APPROPRIATE ANTICOAGULANT THERAPY.   CBC     Status: None   Collection Time: 04/29/15  5:07 PM  Result Value Ref Range   WBC 8.7 4.0 - 10.5 K/uL   RBC 4.25 3.87 - 5.11 MIL/uL   Hemoglobin 12.7 12.0 - 15.0 g/dL   HCT 38.2 36.0 - 46.0 %   MCV 89.9 78.0 - 100.0 fL   MCH 29.9 26.0 - 34.0 pg   MCHC 33.2 30.0 - 36.0 g/dL   RDW 13.9 11.5 - 15.5 %   Platelets 180 150 - 400 K/uL  Differential     Status: None   Collection Time: 04/29/15  5:07 PM  Result Value Ref Range   Neutrophils Relative % 59 43 - 77 %   Neutro Abs 5.1 1.7 - 7.7 K/uL   Lymphocytes Relative 29 12 - 46 %   Lymphs Abs 2.6 0.7 - 4.0 K/uL   Monocytes Relative 10 3 - 12 %   Monocytes Absolute 0.9 0.1 - 1.0 K/uL   Eosinophils Relative 2 0 - 5 %   Eosinophils Absolute 0.2 0.0 - 0.7 K/uL   Basophils Relative 0 0 - 1 %   Basophils Absolute 0.0 0.0 - 0.1 K/uL  Comprehensive metabolic panel     Status: Abnormal   Collection Time: 04/29/15  5:07 PM  Result Value Ref Range   Sodium 135 135 - 145 mmol/L   Potassium 3.7 3.5 - 5.1 mmol/L   Chloride 105 101 - 111 mmol/L   CO2 22 22 - 32 mmol/L   Glucose, Bld 107 (H) 65 - 99 mg/dL   BUN 10 6 - 20 mg/dL   Creatinine, Ser 0.90 0.44 - 1.00 mg/dL   Calcium 9.0 8.9 - 10.3 mg/dL   Total Protein 6.7 6.5 - 8.1 g/dL   Albumin 3.6 3.5 - 5.0 g/dL   AST 17 15 - 41 U/L   ALT 11 (L) 14 - 54 U/L   Alkaline Phosphatase 74 38 - 126 U/L   Total Bilirubin 0.8 0.3 - 1.2 mg/dL   GFR calc non Af Amer >60 >60 mL/min   GFR calc Af Amer >60 >60 mL/min  Comment: (NOTE) The eGFR has been calculated using the CKD EPI equation. This calculation has not been validated in all clinical situations. eGFR's persistently <60 mL/min signify possible Chronic Kidney Disease.    Anion gap 8 5 - 15  Urine rapid drug screen (hosp performed)not at [facility]     Status: None   Collection Time: 04/29/15  5:46 PM  Result Value Ref Range   Opiates NONE DETECTED NONE DETECTED   Cocaine NONE DETECTED NONE DETECTED    Benzodiazepines NONE DETECTED NONE DETECTED   Amphetamines NONE DETECTED NONE DETECTED   Tetrahydrocannabinol NONE DETECTED NONE DETECTED   Barbiturates NONE DETECTED NONE DETECTED    Comment:        DRUG SCREEN FOR MEDICAL PURPOSES ONLY.  IF CONFIRMATION IS NEEDED FOR ANY PURPOSE, NOTIFY LAB WITHIN 5 DAYS.        LOWEST DETECTABLE LIMITS FOR URINE DRUG SCREEN Drug Class       Cutoff (ng/mL) Amphetamine      1000 Barbiturate      200 Benzodiazepine   468 Tricyclics       032 Opiates          300 Cocaine          300 THC              50   Urinalysis, Routine w reflex microscopic (not at [facility])     Status: Abnormal   Collection Time: 04/29/15  5:46 PM  Result Value Ref Range   Color, Urine YELLOW YELLOW   APPearance CLEAR CLEAR   Specific Gravity, Urine <1.005 (L) 1.005 - 1.030   pH 6.0 5.0 - 8.0   Glucose, UA NEGATIVE NEGATIVE mg/dL   Hgb urine dipstick SMALL (A) NEGATIVE   Bilirubin Urine NEGATIVE NEGATIVE   Ketones, ur NEGATIVE NEGATIVE mg/dL   Protein, ur NEGATIVE NEGATIVE mg/dL   Urobilinogen, UA 0.2 0.0 - 1.0 mg/dL   Nitrite NEGATIVE NEGATIVE   Leukocytes, UA NEGATIVE NEGATIVE  Urine microscopic-add on     Status: Abnormal   Collection Time: 04/29/15  5:46 PM  Result Value Ref Range   Squamous Epithelial / LPF FEW (A) RARE   WBC, UA 0-2 <3 WBC/hpf   RBC / HPF 3-6 <3 RBC/hpf   Bacteria, UA RARE RARE  Fasting lipid panel     Status: Abnormal   Collection Time: 04/30/15  5:43 AM  Result Value Ref Range   Cholesterol 138 0 - 200 mg/dL   Triglycerides 117 <150 mg/dL   HDL 32 (L) >40 mg/dL   Total CHOL/HDL Ratio 4.3 RATIO   VLDL 23 0 - 40 mg/dL   LDL Cholesterol 83 0 - 99 mg/dL    Comment:        Total Cholesterol/HDL:CHD Risk Coronary Heart Disease Risk Table                     Men   Women  1/2 Average Risk   3.4   3.3  Average Risk       5.0   4.4  2 X Average Risk   9.6   7.1  3 X Average Risk  23.4   11.0        Use the calculated Patient Ratio above  and the CHD Risk Table to determine the patient's CHD Risk.        ATP III CLASSIFICATION (LDL):  <100     mg/dL   Optimal  100-129  mg/dL  Near or Above                    Optimal  130-159  mg/dL   Borderline  160-189  mg/dL   High  >190     mg/dL   Very High   Glucose, capillary     Status: Abnormal   Collection Time: 04/30/15  9:30 AM  Result Value Ref Range   Glucose-Capillary 134 (H) 65 - 99 mg/dL  Troponin I     Status: None   Collection Time: 04/30/15 11:24 AM  Result Value Ref Range   Troponin I <0.03 <0.031 ng/mL    Comment:        NO INDICATION OF MYOCARDIAL INJURY.   Glucose, capillary     Status: None   Collection Time: 04/30/15 12:09 PM  Result Value Ref Range   Glucose-Capillary 84 65 - 99 mg/dL   Comment 1 Notify RN   Glucose, capillary     Status: None   Collection Time: 04/30/15  4:12 PM  Result Value Ref Range   Glucose-Capillary 96 65 - 99 mg/dL   Comment 1 Notify RN    Comment 2 Document in Chart     Studies/Results: Head CTA: IMPRESSION: 1. Normal variant MRA circle of Willis without significant proximal stenosis, aneurysm, or branch vessel occlusion. 2. Mild distal small vessel disease is suggested.  Carotid duplex Doppler were unrevealing/unremarkable.  Head CT normal.   Nicole Clements, M.D.  Diplomate, Tax adviser of Psychiatry and Neurology ( Neurology). 04/30/2015, 5:57 PM

## 2015-04-30 NOTE — Evaluation (Signed)
Physical Therapy Evaluation Patient Details Name: Nicole Clements MRN: [id number] DOB: Jul 25, 1941 Today's Date: 04/30/2015   History of Present Illness  74 year old woman past medical history ischemic cardiomyopathy, coronary artery disease with stent placement, ICD placement 6/16 presented to the emergency department with history of expressive aphasia which was confirmed on examination in the emergency department, no other focal deficits were noted. Not a candidate for TPA secondary to delay in presentation. Compliant with aspirin Plavix. CT head was negative. She was referred for stroke evaluation.  Clinical Impression  Pt was seen for evaluation.  She was alert and oriented, cooperative.  She did demonstrate difficulty expressing thoughts and at times had some difficulty understanding directions unless given slowly.  Her strength, coordination and balance were WNL.  Her gait with no assistive device was stable.  No further PT is needed.    Follow Up Recommendations No PT follow up    Equipment Recommendations  None recommended by PT    Recommendations for Other Services   ST    Precautions / Restrictions Precautions Precautions: None Restrictions Weight Bearing Restrictions: No      Mobility  Bed Mobility Overal bed mobility: Independent                Transfers Overall transfer level: Independent                  Ambulation/Gait Ambulation/Gait assistance: Independent Ambulation Distance (Feet): 100 Feet Assistive device: None Gait Pattern/deviations: WFL(Within Functional Limits)   Gait velocity interpretation: at or above normal speed for age/gender                Modified Lopez (Stroke Patients Only) Modified Nusser (Stroke Patients Only) Pre-Morbid Mullaly Score: No symptoms Modified [place]: No significant disability     Balance Overall balance assessment: Independent                                            Pertinent Vitals/Pain Pain Assessment: No/denies pain    Home Living Family/patient expects to be discharged to:: Private residence Living Arrangements: Alone Available Help at Discharge: Friend(s);Available PRN/intermittently;Family Type of Home: House Home Access: Level entry     Home Layout: One level Home Equipment: None      Prior Function Level of Independence: Independent               Hand Dominance        Extremity/Trunk Assessment   Upper Extremity Assessment: Defer to OT evaluation           Lower Extremity Assessment: Overall WFL for tasks assessed      Cervical / Trunk Assessment: Normal  Communication   Communication: Expressive difficulties  Cognition Arousal/Alertness: Awake/alert Behavior During Therapy: WFL for tasks assessed/performed Overall Cognitive Status: Within Functional Limits for tasks assessed                                    Assessment/Plan    PT Assessment Patent does not need any further PT services  PT Diagnosis     PT Problem List    PT Treatment Interventions     PT Goals (Current goals can be found in the Care Plan section) Acute Rehab PT Goals PT Goal Formulation: All assessment and education complete, DC therapy  Frequency     Barriers to discharge  none                     End of Session Equipment Utilized During Treatment: Gait belt Activity Tolerance: Patient tolerated treatment well Patient left: in bed;with call bell/phone within reach;with bed alarm set           Time: 1140-1157 PT Time Calculation (min) (ACUTE ONLY): 17 min   Charges:   PT Evaluation $Initial PT Evaluation Tier I: 1 Procedure     PT G CodesMyrlene Broker L  PT 04/30/2015, 12:02 PM [phone]

## 2015-04-30 NOTE — Progress Notes (Addendum)
PROGRESS NOTE  Nicole Clements VPL:[id number] DOB: 23-Sep-1941 DOA: 04/29/2015 PCP: Lupita Raider, MD  Summary: 74 year old woman past medical history ischemic cardiomyopathy, coronary artery disease with stent placement, ICD placement 6/16 presented to the emergency department with history of expressive aphasia which was confirmed on examination in the emergency department, no other focal deficits were noted. Not a candidate for TPA secondary to delay in presentation. Compliant with aspirin Plavix. CT head was negative. She was referred for stroke evaluation.  Assessment/Plan: 1. Stroke, presumed ischemic with expressive aphasia. MRI not obtainable secondary to defibrillator placement. 2. Ischemic cardiomyopathy. S/p ICD placement 6/16, CAD s/p DES to RCA. Continue ASA, Careg, Plavix, Lasix, losartan, pravastatin, spironolactone.   Proceed with stroke evaluation, echo, Neurology consultation pending. Therapy consultations pending.  Continue ASA, Plavix, statin.  Code Status: full code DVT prophylaxis: Lovenox Family Communication: none present. Patient alert and understands plan. Disposition Plan: home when improved.  Brendia Sacks, MD  [facility]  Pager [phone] If 7PM-7AM, please contact night-coverage at [email or web address], password [facility] 04/30/2015, 9:42 AM  LOS: 1 day   Consultants:  Neurology  PT, OT, ST  Procedures:  Echo pending  Antibiotics:    HPI/Subjective: Nursing noted continued expressive aphasia.  No arm or leg weakness or numbness or tingling. Still some word difficulties. No dysphagia.  Objective: Filed Vitals:   04/30/15 0156 04/30/15 0355 04/30/15 0600 04/30/15 0831  BP: 108/48 99/52 105/51 108/67  Pulse: 68 70 70 70  Temp: 98.4 F (36.9 C) 97.8 F (36.6 C) 98.4 F (36.9 C)   TempSrc: Oral Oral Oral   Resp: 18 18 20 20   Height:      Weight:      SpO2: 98% 95% 97% 98%   No intake or output data in the 24 hours ending 04/30/15  0942   Filed Weights   04/29/15 2157  Weight: 73.619 kg (162 lb 4.8 oz)    Exam:     Afebrile, VSS, no hypoxia General:  Appears calm and comfortable Eyes: PERRL, normal lids, irises  ENT: grossly normal hearing, lips Cardiovascular: RRR, no m/r/g. No LE edema. Telemetry: paced rhythm  Respiratory: CTA bilaterally, no w/r/r. Normal respiratory effort. Abdomen: soft, ntnd Skin: no rash or induration noted Musculoskeletal: grossly normal tone BUE/BLE. Strength 5/5 BUE and BLE. No pronator drift. No UE pass pointing. Psychiatric: grossly normal mood and affect, some word finding difficulty and expressive aphasia. Neurologic: tongue deviation to right. CN otherwise appear normal.  New data reviewed:  LDL 83  Pertinent data since admission:  CMP, CBC  UDS negative  CT head negative  Carotid ultrasound, mild stenosis right and left internal carotid artery.  EKG paced rhythm  Pending data:    Scheduled Meds: .  stroke: mapping our early stages of recovery book   Does not apply Once  . aspirin EC  81 mg Oral Daily  . carvedilol  12.5 mg Oral BID  . cholecalciferol  2,000 Units Oral Daily  . clopidogrel  75 mg Oral Q breakfast  . enoxaparin (LOVENOX) injection  40 mg Subcutaneous Q24H  . omega-3 acid ethyl esters  2 g Oral BID  . pravastatin  80 mg Oral QPM   Continuous Infusions: . sodium chloride 50 mL/hr at 04/29/15 2258    Principal Problem:   Stroke Active Problems:   CAD- remote MI '90s, distal RCA DES 07/17/14   ICM- new LVD 20-25% 07/13/14   Chronic systolic CHF (congestive heart failure), NYHA class 3   Ventricular tachycardia,  non-sustained   Expressive aphasia   Time spent 25 minutes

## 2015-05-01 ENCOUNTER — Encounter (HOSPITAL_COMMUNITY): Payer: Self-pay | Admitting: Cardiology

## 2015-05-01 DIAGNOSIS — I5023 Acute on chronic systolic (congestive) heart failure: Secondary | ICD-10-CM

## 2015-05-01 DIAGNOSIS — R4701 Aphasia: Secondary | ICD-10-CM

## 2015-05-01 DIAGNOSIS — I255 Ischemic cardiomyopathy: Secondary | ICD-10-CM

## 2015-05-01 DIAGNOSIS — I5022 Chronic systolic (congestive) heart failure: Secondary | ICD-10-CM

## 2015-05-01 DIAGNOSIS — I2510 Atherosclerotic heart disease of native coronary artery without angina pectoris: Secondary | ICD-10-CM

## 2015-05-01 DIAGNOSIS — I639 Cerebral infarction, unspecified: Principal | ICD-10-CM

## 2015-05-01 LAB — GLUCOSE, CAPILLARY
Glucose-Capillary: 79 mg/dL (ref 65–99)
Glucose-Capillary: 85 mg/dL (ref 65–99)
Glucose-Capillary: 96 mg/dL (ref 65–99)
Glucose-Capillary: 98 mg/dL (ref 65–99)

## 2015-05-01 LAB — RAPID URINE DRUG SCREEN, HOSP PERFORMED
Amphetamines: NOT DETECTED
Barbiturates: NOT DETECTED
Benzodiazepines: NOT DETECTED
COCAINE: NOT DETECTED
Opiates: NOT DETECTED
TETRAHYDROCANNABINOL: NOT DETECTED

## 2015-05-01 LAB — HEMOGLOBIN A1C
HEMOGLOBIN A1C: 6.7 % — AB (ref 4.8–5.6)
Mean Plasma Glucose: 146 mg/dL

## 2015-05-01 NOTE — Consult Note (Signed)
CARDIOLOGY CONSULT NOTE   Patient ID: Nicole Clements MRN: [id number] DOB/AGE: Sep 05, 1941 74 y.o.  Admit Date: 04/29/2015 Referring Physician: PTH-Ortiz  MD Primary Physician: Lupita Raider, MD Consulting Cardiologist: Nona Dell MD Primary Cardiologist: Dina Rich MD Reason for Consultation: Stroke, history of ICM s/p MDT-ICD/CRT implant on 04/18/2015  Clinical Summary Nicole Clements is a 75 y.o.female with hx of CAD, DES to RCA 07/2014 on DAPT, ICM with EF of 20-25%, recent MDT-ICD implant on 6.15.2016 by Dr. Graciela Husbands, presented to the ER with sudden expressive dysphasia, right facial drooping which quickly resolved. CT and CTA were negative for acute infarct or hemorrhage, carotid dopplers negative for occlusive disease. She was seen by Dr. Gerilyn Pilgrim with diagnosis of left operculum temporal region stroke, although no acute findings by head imaging, small vessel intracranial disease noted. No head MRI with ICD in place. Question raised about the possibility of a cardioembolic event, although the patient has not had any documented atrial arrhythmias as yet. Thoracic echocardiogram showed no obvious ASD or PFO.  In ER, BP was 142/55, HR 82, O2 Sat 88%. Labs were unremarkable. EKG demonstrated RBBB, LVH and AV pacing. Patient is a difficult historian as she continues to have expressive dysphasia. She denies losing her sight, but continues to say "I couldn't see."  She denies chest pain, dyspnea, or near syncope. She denies focal weakness. States that sister and niece came to see her yesterday because she did not make it to church. She didn't understand what they were saying to her. They became worried and brought her to ER.  Denies medical non-adherence.   Allergies  Allergen Reactions  . Lactose Intolerance (Gi) Other (See Comments)    GI Upset   . Sulfa Antibiotics Rash    Medications Scheduled Medications: . aspirin EC  81 mg Oral Daily  . carvedilol  12.5 mg Oral BID  .  cholecalciferol  2,000 Units Oral Daily  . clopidogrel  75 mg Oral Q breakfast  . enoxaparin (LOVENOX) injection  40 mg Subcutaneous Q24H  . omega-3 acid ethyl esters  2 g Oral BID  . pravastatin  80 mg Oral QPM    PRN Medications: acetaminophen   Past Medical History  Diagnosis Date  . STEMI (ST elevation myocardial infarction) 07/22/1996  . CAD (coronary artery disease) April 2014    DES to PLA, occluded PDA 07/2014  . Hypertension   . Hypercholesterolemia   . S/P colonoscopy August 2007    Hyperplastic polyps, rare sigmoid and descending colon diverticulosis, small internal hemorrhoids  . CHF (congestive heart failure)   . Ischemic cardiomyopathy     LVEF 25%-45%  . PVC's (premature ventricular contractions)   . IVCD (intraventricular conduction defect)     Past Surgical History  Procedure Laterality Date  . Cardiac catheterization  April 2014    Med Rx  . S/p hysterectomy    . Left and right heart catheterization with coronary angiogram N/A 07/17/2014    Procedure: LEFT AND RIGHT HEART CATHETERIZATION WITH CORONARY ANGIOGRAM;  Surgeon: Micheline Chapman, MD;  Location: [facility] CATH LAB;  Service: Cardiovascular;  Laterality: N/A;  . Percutaneous coronary stent intervention (pci-s)  07/17/2014    Procedure: PERCUTANEOUS CORONARY STENT INTERVENTION (PCI-S);  Surgeon: Micheline Chapman, MD;  Location: [facility] CATH LAB;  Service: Cardiovascular;;  Distal RCA  . Ep implantable device  04/18/2015    BV ICD  . Ep implantable device N/A 04/18/2015    Procedure: BiV ICD Insertion CRT-D;  Surgeon: Viviann Spare  Anabel Halon, MD;  Location: [facility] INVASIVE CV LAB;  Service: Cardiovascular;  Laterality: N/A;    Family History  Problem Relation Age of Onset  . Colon cancer Neg Hx   . Hypertension Sister   . Diabetes Mellitus II Sister   . Hypertension Brother   . Diabetes Mellitus II Brother   . Hypertension Brother   . Diabetes Mellitus II Brother   . Hypertension Brother   . Diabetes Mellitus II Brother      Social History Nicole Clements reports that she has been smoking Cigarettes.  She started smoking about 53 years ago. She has a 25 pack-year smoking history. She has never used smokeless tobacco. Nicole Clements reports that she does not drink alcohol.  Review of Systems Complete review of systems are found to be negative unless outlined in H&P above.  Physical Examination Blood pressure 123/66, pulse 70, temperature 97.8 F (36.6 C), temperature source Oral, resp. rate 18, height 5\' 3"  (1.6 m), weight 162 lb 4.8 oz (73.619 kg), SpO2 98 %.  Intake/Output Summary (Last 24 hours) at 05/01/15 1050 Last data filed at 04/30/15 2300  Gross per 24 hour  Intake    460 ml  Output    300 ml  Net    160 ml    Telemetry:AV Pacing  GEN: No acute distress HEENT: Conjunctiva and lids normal, oropharynx clear with moist mucosa. Neck: Supple, no elevated JVP or carotid bruits, no thyromegaly. Lungs: Clear to auscultation, nonlabored breathing at rest. Cardiac: Regular rate and rhythm, no S3 or significant systolic murmur, no pericardial rub. Abdomen: Soft, nontender, no hepatomegaly, bowel sounds present, no guarding or rebound. Extremities: No pitting edema, distal pulses 2+. Skin: Warm and dry. Musculoskeletal: No kyphosis. Neuropsychiatric: Alert and oriented x3, affect grossly appropriate. Expressive dysphasia is noted. No focal deficits.   Prior Cardiac Testing/Procedures 1. Carotid Ultrasound 04/30/2015 IMPRESSION: 1. Mild (1-49%) stenosis of the proximal right internal carotid artery secondary to smooth heterogeneous atherosclerotic plaque. No interval change compared to recent prior imaging from earlier this month. 2. Mild (1-49%) stenosis of the proximal left internal carotid artery secondary to heterogeneous and irregular atherosclerotic plaque. No interval change compared to recent prior imaging from earlier this month. 3. Vertebral arteries remain patent with normal antegrade  flow.  Echocardiogram 04/30/2015 Left ventricle: The cavity size was moderately dilated. Wall thickness was normal. Systolic function was severely reduced. The estimated ejection fraction was in the range of 20% to 25%. Doppler parameters are consistent with abnormal left ventricular relaxation (grade 1 diastolic dysfunction). - Aortic valve: Mildly calcified annulus. Trileaflet; mildly thickened leaflets. Valve area (VTI): 1.57 cm^2. Valve area (Vmax): 1.73 cm^2. - Mitral valve: Mildly calcified annulus. Mildly thickened leaflets . There was mild regurgitation. - Left atrium: The atrium was moderately dilated. - Right ventricle: The cavity size was normal. Wall thickness was increased. - Atrial septum: No defect or patent foramen ovale was identified. - Technically adequate study.  Cardiac Cath with PCI 07/18/2015  1. Nonobstructive disease involving the LAD, left circumflex, and OM/diagonal side branches 2. Severe stenosis of the distal RCA bifurcation, treated successfully with a drug-eluting stent into the PLA branch with residual occlusion of the PDA branch 3. Severe segmental LV systolic dysfunction Recommendations: Dual antiplatelet therapy with aspirin and Plavix for at least 12 months. Will cycle cardiac enzymes since the PDA branch is occluded after stenting.  Lab Results  Basic Metabolic Panel:  Recent Labs Lab 04/29/15 1707  NA 135  K 3.7  CL 105  CO2 22  GLUCOSE 107*  BUN 10  CREATININE 0.90  CALCIUM 9.0    Liver Function Tests:  Recent Labs Lab 04/29/15 1707  AST 17  ALT 11*  ALKPHOS 74  BILITOT 0.8  PROT 6.7  ALBUMIN 3.6    CBC:  Recent Labs Lab 04/29/15 1707  WBC 8.7  NEUTROABS 5.1  HGB 12.7  HCT 38.2  MCV 89.9  PLT 180    Cardiac Enzymes:  Recent Labs Lab 04/30/15 1124  TROPONINI <0.03    Radiology: Ct Angio Head W/cm &/or Wo Cm  04/30/2015   CLINICAL DATA:  Right-sided facial droop.  Slurred speech.  TIA.   EXAM: CT ANGIOGRAPHY HEAD  TECHNIQUE: Multidetector CT imaging of the head was performed using the standard protocol during bolus administration of intravenous contrast. Multiplanar CT image reconstructions and MIPs were obtained to evaluate the vascular anatomy.  CONTRAST:  75mL OMNIPAQUE IOHEXOL 350 MG/ML SOLN  COMPARISON:  CT head without contrast 04/29/2015. Carotid ultrasound 04/30/2015.  FINDINGS: CT HEAD  Brain: The source images demonstrate no acute or focal cortical infarct. The basal ganglia are intact. The insular ribbon is within normal limits.  Calvarium and skull base: Negative  Paranasal sinuses: Clear  Orbits: Within normal limits.  CTA HEAD  Anterior circulation: Minimal atherosclerotic calcifications are present within the precavernous internal carotid arteries bilaterally. The internal carotid arteries are otherwise normal. The A1 and M1 segments are within normal limits. The anterior communicating artery is patent. There is minimal attenuation of distal MCA branch vessels without significant proximal stenosis or occlusion.  Posterior circulation: The left vertebral artery is slightly dominant to the right. The right PICA origin is below the dural margin. The left PICA is not visualized. A prominent left AICA vessel is present. The basilar artery is small. Posterior communicating arteries are present bilaterally. The posterior cerebral arteries originate from these posterior communicating arteries and P1 segments. The PCA branch vessels are intact.  Venous sinuses: The dural sinuses are patent. The straight sinus and deep cerebral veins are normal.  Anatomic variants: Prominent posterior communicating arteries contribute to the posterior cerebral arteries with small P1 segments.  Delayed phase:No pathologic enhancement is present.  IMPRESSION: 1. Normal variant MRA circle of Willis without significant proximal stenosis, aneurysm, or branch vessel occlusion. 2. Mild distal small vessel disease is  suggested.   Electronically Signed   By: Marin Roberts M.D.   On: 04/30/2015 11:22   Ct Head Wo Contrast  04/29/2015   CLINICAL DATA:  Aphasia.  Slurred speech.  Right facial droop.  EXAM: CT HEAD WITHOUT CONTRAST  TECHNIQUE: Contiguous axial images were obtained from the base of the skull through the vertex without intravenous contrast.  COMPARISON:  None.  FINDINGS: No intracranial hemorrhage, mass effect, or midline shift. No hydrocephalus. The basilar cisterns are patent. No evidence of territorial infarct. No intracranial fluid collection. Calvarium is intact. Included paranasal sinuses and mastoid air cells are well aerated.  IMPRESSION: No acute intracranial abnormality on noncontrast head CT.   Electronically Signed   By: Rubye Oaks M.D.   On: 04/29/2015 17:47   US Carotid Bilateral  04/30/2015   CLINICAL DATA:  74 year old female with symptoms of transient ischemic attack  EXAM: BILATERAL CAROTID DUPLEX ULTRASOUND  TECHNIQUE: Wallace Cullens scale imaging, color Doppler and duplex ultrasound were performed of bilateral carotid and vertebral arteries in the neck.  COMPARISON:  Head CT 04/29/2015 ; duplex carotid ultrasound 04/11/2015  FINDINGS: Criteria: Quantification of carotid  stenosis is based on velocity parameters that correlate the residual internal carotid diameter with NASCET-based stenosis levels, using the diameter of the distal internal carotid lumen as the denominator for stenosis measurement.  The following velocity measurements were obtained:  RIGHT  ICA:  86/20 cm/sec  CCA:  75/15 cm/sec  SYSTOLIC ICA/CCA RATIO:  1.1  DIASTOLIC ICA/CCA RATIO:  1.3  ECA:  88 cm/sec  LEFT  ICA:  94/27 cm/sec  CCA:  66/13 cm/sec  SYSTOLIC ICA/CCA RATIO:  1.4  DIASTOLIC ICA/CCA RATIO:  2.1  ECA:  80 cm/sec  RIGHT CAROTID ARTERY: Smooth heterogeneous atherosclerotic plaque beginning in the distal common carotid artery and extending into the internal carotid artery. By peak systolic velocity criteria the  estimated stenosis remains less than 50%. No interval change compared to recent prior imaging.  RIGHT VERTEBRAL ARTERY:  Patent with normal antegrade flow.  LEFT CAROTID ARTERY: Smooth heterogeneous atherosclerotic plaque throughout the common carotid artery extending into the internal carotid artery. By peak systolic velocity criteria, the estimated stenosis remains less than 50%. Of note, the plaque in the proximal internal carotid artery is irregular.  LEFT VERTEBRAL ARTERY:  Patent with normal antegrade flow.  IMPRESSION: 1. Mild (1-49%) stenosis of the proximal right internal carotid artery secondary to smooth heterogeneous atherosclerotic plaque. No interval change compared to recent prior imaging from earlier this month. 2. Mild (1-49%) stenosis of the proximal left internal carotid artery secondary to heterogeneous and irregular atherosclerotic plaque. No interval change compared to recent prior imaging from earlier this month. 3. Vertebral arteries remain patent with normal antegrade flow. Signed,  Sterling Big, MD  [facility]  [facility]   Electronically Signed   By: Malachy Moan M.D.   On: 04/30/2015 08:37   ECG: RBBB, AV pacing 70 bpm.   Impression and Recommendations  1. CVA: Continued expressive dysphasia. No facial drooping noted. Will have pacemaker interrogated for evaluation of any atrial arrhythmias, also continue telemetry. Continue DAPT for now.  2. S/P ICD-CRT implantation: Implanted on 04/18/15. Site is healing well without evidence of infection.   3. CAD: S/P DES to RCA in 2015. Continues on carvedilol, ASA, Plavix and statin.   4. Hypertension: BP is low normal.    Signed: Bettey Mare. Lawrence Clements AACC  05/01/2015, 10:50 AM Co-Sign MD   Attending note:  Patient seen and examined. Discussed the case with Nicole Clements. Agree with her assessment outlined above. Nicole Clements presents with new expressive aphasia,  transient facial drooping. She has been diagnosed with stroke by Dr. Gerilyn Pilgrim, left operculum temporal region, although no obvious acute findings by head CT or CTA. She did not undergo head MRI due to having a defibrillator in place. Cardiac history includes CAD with ischemic cardiomyopathy and LVEF 20-25%. She just recently underwent prophylactic Medtronic ICD implantation by Dr. Graciela Husbands on June 15. She does not have any history of obvious atrial arrhythmias. Telemetry shows dual-chamber paced rhythm as does ECG. She has been on aspirin and Plavix with history of DES to the RCA back in September 2015. On examination she appears comfortable, still has an expressive aphasia, otherwise no focal motor weakness. Lungs are clear and nonlabored, cardiac exam reveals a regular rate and rhythm without gallop. Thorax shows a well-healing device pocket site on the left, no erythema or drainage. Echocardiogram shows no clear evidence of PFO or ASD. Plan at this time is to have her ICD interrogated to assess for any evidence of atrial arrhythmias  that would require a change in medical regimen. If we were to see evidence of atrial fibrillation or flutter, would need to consider stopping aspirin and placing her on an anticoagulant with continuation of Plavix at least until September of this year in light of her DES placement. Otherwise, would continue DAPT. We will follow with you.  Jonelle Sidle, M.D., F.A.C.C.

## 2015-05-01 NOTE — Progress Notes (Signed)
PT Cancellation Note  Patient Details Name: Nicole Clements MRN: [id number] DOB: Jan 22, 1941   Cancelled Treatment:    Reason Eval/Treat Not Completed: Other (comment).  Pt was seen on 04-30-15 for a PT eval.  She was found to have no problems other than aphasia.  Please refer to my note.  If something else has changed which would warrant another eval, please reconsult.  Thanks.   Myrlene Broker L  PT 05/01/2015, 9:07 AM [phone]

## 2015-05-01 NOTE — Evaluation (Signed)
Occupational Therapy Evaluation Patient Details Name: ROANNE HAYE MRN: [id number] DOB: February 16, 1941 Today's Date: 05/01/2015    History of Present Illness Pt is a 74 year old woman past medical history ischemic cardiomyopathy, coronary artery disease with stent placement, ICD placement 6/16 presented to the emergency department with history of expressive aphasia which was confirmed on examination in the emergency department, no other focal deficits were noted. Not a candidate for TPA secondary to delay in presentation. Compliant with aspirin Plavix. CT head was negative. She was referred for stroke evaluation.   Clinical Impression   PTA pt lived at home, alone, sister lives nearby. Pt awake and alert this am, continues to demonstrate expressive aphasia stating "I can see it but I just can't say it." Pt demonstrates BUE range of motion WFL and good strength 4/5. Pt is Mod I in lower body dressing and feeding tasks. Pt is at baseline with B/IADL tasks, no further OT services required at this time.     Follow Up Recommendations  No OT follow up    Equipment Recommendations  None recommended by OT       Precautions / Restrictions Precautions Precautions: None Restrictions Weight Bearing Restrictions: No      Mobility Bed Mobility Overal bed mobility: Independent                Transfers Overall transfer level: Independent                         ADL Overall ADL's : Modified independent;At baseline Eating/Feeding: Modified independent                   Lower Body Dressing: Modified independent                       Vision Vision Assessment?: Yes Eye Alignment: Within Functional Limits Ocular Range of Motion: Within Functional Limits Alignment/Gaze Preference: Within Defined Limits Tracking/Visual Pursuits: Able to track stimulus in all quads without difficulty Saccades: Within functional limits Convergence: Within functional  limits Visual Fields: No apparent deficits          Pertinent Vitals/Pain Pain Assessment: No/denies pain     Hand Dominance Right   Extremity/Trunk Assessment Upper Extremity Assessment Upper Extremity Assessment: Overall WFL for tasks assessed   Lower Extremity Assessment Lower Extremity Assessment: Defer to PT evaluation       Communication Communication Communication: Expressive difficulties   Cognition Arousal/Alertness: Awake/alert Behavior During Therapy: WFL for tasks assessed/performed Overall Cognitive Status: Within Functional Limits for tasks assessed                                Home Living Family/patient expects to be discharged to:: Private residence Living Arrangements: Alone Available Help at Discharge: Family;Friend(s);Available PRN/intermittently Type of Home: House             Bathroom Shower/Tub: Chief Strategy Officer: Standard     Home Equipment: None      Lives With: Alone    Prior Functioning/Environment Level of Independence: Independent              End of Session    Activity Tolerance: Patient tolerated treatment well Patient left: in bed;with call bell/phone within reach;with bed alarm set   Time: [id number] OT Time Calculation (min): 26 min Charges:  OT General Charges $OT Visit: 1 Procedure OT Evaluation $Initial OT  Evaluation Tier I: 1 Procedure  Ezra Sites, OTR/L  [phone]  05/01/2015, 9:05 AM

## 2015-05-01 NOTE — Progress Notes (Addendum)
[facility] PROGRESS NOTE Assessment/Plan: CVA: - Unable to obtain an MRI due to defibrillator, neurology was consulted who recommended to continue antiplatelet therapy. - Cardiology recommended cardioembolic evaluation,  For concern of a cardioembolic stroke. - Cardiology was consulted, recommended pacer interrogation.  Ischemic cardiomyopathy/ Chronic systolic CHF (congestive heart failure), NYHA class 3/ICM- new LVD 20-25% 07/13/14: - Status post ICD on 616, PCI with a DES to the RCA, continue aspirin Plavix Lasix and losartan. - Continue statin and Aldactone.    CAD- remote MI '90s, distal RCA DES 07/17/14    Code Status: full code DVT prophylaxis: Lovenox Family Communication: none present. Patient alert and understands plan. Disposition Plan: home in 24 hrs   Consultants:  Cardiology  Neurology  Procedures:  CT head  Antibiotics:  none  HPI/Subjective: No complains  Objective: Filed Vitals:   04/30/15 1433 04/30/15 2109 05/01/15 0020 05/01/15 0417  BP: 116/55 117/51 117/77 107/49  Pulse: 69 70 71 79  Temp: 98.3 F (36.8 C) 98.7 F (37.1 C) 97.9 F (36.6 C) 98.3 F (36.8 C)  TempSrc: Oral Oral Oral Oral  Resp: 20 18 18 20   Height:      Weight:      SpO2: 99% 97% 96% 93%    Intake/Output Summary (Last 24 hours) at 05/01/15 0859 Last data filed at 04/30/15 2300  Gross per 24 hour  Intake    460 ml  Output    300 ml  Net    160 ml   Filed Weights   04/29/15 2157  Weight: 73.619 kg (162 lb 4.8 oz)    Exam:  General: Alert, awake, oriented x3, in no acute distress.  HEENT: No bruits, no goiter.  Heart: Regular rate and rhythm. Lungs: Good air movement, clear Abdomen: Soft, nontender, nondistended, positive bowel sounds.  Neuro: Grossly intact, nonfocal.   Data Reviewed: Basic Metabolic Panel:  Recent Labs Lab 04/29/15 1707  NA 135  K 3.7  CL 105  CO2 22  GLUCOSE 107*  BUN 10  CREATININE 0.90  CALCIUM 9.0   Liver  Function Tests:  Recent Labs Lab 04/29/15 1707  AST 17  ALT 11*  ALKPHOS 74  BILITOT 0.8  PROT 6.7  ALBUMIN 3.6   No results for input(s): LIPASE, AMYLASE in the last 168 hours. No results for input(s): AMMONIA in the last 168 hours. CBC:  Recent Labs Lab 04/29/15 1707  WBC 8.7  NEUTROABS 5.1  HGB 12.7  HCT 38.2  MCV 89.9  PLT 180   Cardiac Enzymes:  Recent Labs Lab 04/30/15 1124  TROPONINI <0.03   BNP (last 3 results) No results for input(s): BNP in the last 8760 hours.  ProBNP (last 3 results)  Recent Labs  07/12/14 1555 07/14/14 0218 07/19/14 0325  PROBNP 8927.0* 2022.0* 708.7*    CBG:  Recent Labs Lab 04/30/15 0930 04/30/15 1209 04/30/15 1612 04/30/15 2119 05/01/15 0746  GLUCAP 134* 84 96 76 79    No results found for this or any previous visit (from the past 240 hour(s)).   Studies: Ct Angio Head W/cm &/or Wo Cm  04/30/2015   CLINICAL DATA:  Right-sided facial droop.  Slurred speech.  TIA.  EXAM: CT ANGIOGRAPHY HEAD  TECHNIQUE: Multidetector CT imaging of the head was performed using the standard protocol during bolus administration of intravenous contrast. Multiplanar CT image reconstructions and MIPs were obtained to evaluate the vascular anatomy.  CONTRAST:  73mL OMNIPAQUE IOHEXOL 350 MG/ML SOLN  COMPARISON:  CT head without  contrast 04/29/2015. Carotid ultrasound 04/30/2015.  FINDINGS: CT HEAD  Brain: The source images demonstrate no acute or focal cortical infarct. The basal ganglia are intact. The insular ribbon is within normal limits.  Calvarium and skull base: Negative  Paranasal sinuses: Clear  Orbits: Within normal limits.  CTA HEAD  Anterior circulation: Minimal atherosclerotic calcifications are present within the precavernous internal carotid arteries bilaterally. The internal carotid arteries are otherwise normal. The A1 and M1 segments are within normal limits. The anterior communicating artery is patent. There is minimal attenuation  of distal MCA branch vessels without significant proximal stenosis or occlusion.  Posterior circulation: The left vertebral artery is slightly dominant to the right. The right PICA origin is below the dural margin. The left PICA is not visualized. A prominent left AICA vessel is present. The basilar artery is small. Posterior communicating arteries are present bilaterally. The posterior cerebral arteries originate from these posterior communicating arteries and P1 segments. The PCA branch vessels are intact.  Venous sinuses: The dural sinuses are patent. The straight sinus and deep cerebral veins are normal.  Anatomic variants: Prominent posterior communicating arteries contribute to the posterior cerebral arteries with small P1 segments.  Delayed phase:No pathologic enhancement is present.  IMPRESSION: 1. Normal variant MRA circle of Willis without significant proximal stenosis, aneurysm, or branch vessel occlusion. 2. Mild distal small vessel disease is suggested.   Electronically Signed   By: Marin Roberts M.D.   On: 04/30/2015 11:22   Ct Head Wo Contrast  04/29/2015   CLINICAL DATA:  Aphasia.  Slurred speech.  Right facial droop.  EXAM: CT HEAD WITHOUT CONTRAST  TECHNIQUE: Contiguous axial images were obtained from the base of the skull through the vertex without intravenous contrast.  COMPARISON:  None.  FINDINGS: No intracranial hemorrhage, mass effect, or midline shift. No hydrocephalus. The basilar cisterns are patent. No evidence of territorial infarct. No intracranial fluid collection. Calvarium is intact. Included paranasal sinuses and mastoid air cells are well aerated.  IMPRESSION: No acute intracranial abnormality on noncontrast head CT.   Electronically Signed   By: Rubye Oaks M.D.   On: 04/29/2015 17:47   US Carotid Bilateral  04/30/2015   CLINICAL DATA:  74 year old female with symptoms of transient ischemic attack  EXAM: BILATERAL CAROTID DUPLEX ULTRASOUND  TECHNIQUE: Wallace Cullens scale  imaging, color Doppler and duplex ultrasound were performed of bilateral carotid and vertebral arteries in the neck.  COMPARISON:  Head CT 04/29/2015 ; duplex carotid ultrasound 04/11/2015  FINDINGS: Criteria: Quantification of carotid stenosis is based on velocity parameters that correlate the residual internal carotid diameter with NASCET-based stenosis levels, using the diameter of the distal internal carotid lumen as the denominator for stenosis measurement.  The following velocity measurements were obtained:  RIGHT  ICA:  86/20 cm/sec  CCA:  75/15 cm/sec  SYSTOLIC ICA/CCA RATIO:  1.1  DIASTOLIC ICA/CCA RATIO:  1.3  ECA:  88 cm/sec  LEFT  ICA:  94/27 cm/sec  CCA:  66/13 cm/sec  SYSTOLIC ICA/CCA RATIO:  1.4  DIASTOLIC ICA/CCA RATIO:  2.1  ECA:  80 cm/sec  RIGHT CAROTID ARTERY: Smooth heterogeneous atherosclerotic plaque beginning in the distal common carotid artery and extending into the internal carotid artery. By peak systolic velocity criteria the estimated stenosis remains less than 50%. No interval change compared to recent prior imaging.  RIGHT VERTEBRAL ARTERY:  Patent with normal antegrade flow.  LEFT CAROTID ARTERY: Smooth heterogeneous atherosclerotic plaque throughout the common carotid artery extending into the internal carotid artery.  By peak systolic velocity criteria, the estimated stenosis remains less than 50%. Of note, the plaque in the proximal internal carotid artery is irregular.  LEFT VERTEBRAL ARTERY:  Patent with normal antegrade flow.  IMPRESSION: 1. Mild (1-49%) stenosis of the proximal right internal carotid artery secondary to smooth heterogeneous atherosclerotic plaque. No interval change compared to recent prior imaging from earlier this month. 2. Mild (1-49%) stenosis of the proximal left internal carotid artery secondary to heterogeneous and irregular atherosclerotic plaque. No interval change compared to recent prior imaging from earlier this month. 3. Vertebral arteries remain  patent with normal antegrade flow. Signed,  Sterling Big, MD  [facility]  [facility]   Electronically Signed   By: Malachy Moan M.D.   On: 04/30/2015 08:37    Scheduled Meds: .  stroke: mapping our early stages of recovery book   Does not apply Once  . aspirin EC  81 mg Oral Daily  . carvedilol  12.5 mg Oral BID  . cholecalciferol  2,000 Units Oral Daily  . clopidogrel  75 mg Oral Q breakfast  . enoxaparin (LOVENOX) injection  40 mg Subcutaneous Q24H  . omega-3 acid ethyl esters  2 g Oral BID  . pravastatin  80 mg Oral QPM   Continuous Infusions:   Time Spent: 25 min   Marinda Elk  [facility] Pager [phone]. If 7PM-7AM, please contact night-coverage at [email or web address], password [facility] 05/01/2015, 8:59 AM  LOS: 2 days

## 2015-05-01 NOTE — Progress Notes (Signed)
Patient ID: Nicole Clements, female   DOB: 1940-11-05, 74 y.o.   MRN: [id number]  [facility] NEUROLOGY Nabeeha Badertscher A. Gerilyn Pilgrim, MD     [email or web address]          Nicole Clements is an 74 y.o. female.   Assessment/Plan: 1. 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 Given the patient's cardiac history and the unremarkable imaging of the intracranial and extracranial vessels, I am concerned that the patient likely has a cardioembolic phenomenon. Consequently, we will try to get a download of the patient's rhythm from her heart monitor/defibrillator. In the meantime, we'll continue with the dual antiplatelet agents of aspirin and Plavix. 2. Ischemic cardiomyopathy. 3. Hypertension. 4. Dyslipidemia.  Doing about the same. Data from Cardiac monitor pending.  GENERAL: This is a pleasant female in no acute distress.  HEENT: Supple. Atraumatic normocephalic.   EXTREMITIES: No edema   BACK: Normal.  SKIN: Normal by inspection.   MENTAL STATUS: She is awake and alert. She has a moderate global aphasia with speech expression more affected than comprehension. She does follow commands with prompting due to difficulty with comprehension impairment. She is oriented to place but has difficulty with orientation to time. She is able to name 3/6 objects only. She also has paraphasic errors.   CRANIAL NERVES: Pupils are equal, round and reactive to light and accommodation; extra ocular movements are full, there is no significant nystagmus; visual fields are full; upper and lower facial muscles are normal in strength and symmetric, there is no flattening of the nasolabial folds; tongue is midline; uvula is midline; shoulder elevation is normal. No extinction to double simultaneous visual stimulation.  MOTOR: Normal tone, bulk and strength; no pronator drift.      Objective: Vital signs in last 24 hours: Temp:  [97.8 F (36.6 C)-98.7 F (37.1 C)] 98 F (36.7 C) (06/28 1448) Pulse  Rate:  [70-79] 70 (06/28 1800) Resp:  [18-20] 20 (06/28 1800) BP: (92-123)/(49-77) 92/58 mmHg (06/28 1800) SpO2:  [93 %-99 %] 99 % (06/28 1800)  Intake/Output from previous day: 06/27 0701 - 06/28 0700 In: 460 [P.O.:460] Out: 300 [Urine:300] Intake/Output this shift:   Nutritional status: Diet Heart Room service appropriate?: Yes; Fluid consistency:: Thin   Lab Results: Results for orders placed or performed during the hospital encounter of 04/29/15 (from the past 48 hour(s))  Fasting lipid panel     Status: Abnormal   Collection Time: 04/30/15  5:43 AM  Result Value Ref Range   Cholesterol 138 0 - 200 mg/dL   Triglycerides 409 <811 mg/dL   HDL 32 (L) >91 mg/dL   Total CHOL/HDL Ratio 4.3 RATIO   VLDL 23 0 - 40 mg/dL   LDL Cholesterol 83 0 - 99 mg/dL    Comment:        Total Cholesterol/HDL:CHD Risk Coronary Heart Disease Risk Table                     Men   Women  1/2 Average Risk   3.4   3.3  Average Risk       5.0   4.4  2 X Average Risk   9.6   7.1  3 X Average Risk  23.4   11.0        Use the calculated Patient Ratio above and the CHD Risk Table to determine the patient's CHD Risk.        ATP III CLASSIFICATION (LDL):  <100     mg/dL  Optimal  100-129  mg/dL   Near or Above                    Optimal  130-159  mg/dL   Borderline  010-071  mg/dL   High  >219     mg/dL   Very High   Glucose, capillary     Status: Abnormal   Collection Time: 04/30/15  9:30 AM  Result Value Ref Range   Glucose-Capillary 134 (H) 65 - 99 mg/dL  Troponin I     Status: None   Collection Time: 04/30/15 11:24 AM  Result Value Ref Range   Troponin I <0.03 <0.031 ng/mL    Comment:        NO INDICATION OF MYOCARDIAL INJURY.   Glucose, capillary     Status: None   Collection Time: 04/30/15 12:09 PM  Result Value Ref Range   Glucose-Capillary 84 65 - 99 mg/dL   Comment 1 Notify RN   Glucose, capillary     Status: None   Collection Time: 04/30/15  4:12 PM  Result Value Ref Range     Glucose-Capillary 96 65 - 99 mg/dL   Comment 1 Notify RN    Comment 2 Document in Chart   Glucose, capillary     Status: None   Collection Time: 04/30/15  9:19 PM  Result Value Ref Range   Glucose-Capillary 76 65 - 99 mg/dL   Comment 1 Notify RN   Urine rapid drug screen (hosp performed)not at [facility]     Status: None   Collection Time: 04/30/15 11:10 PM  Result Value Ref Range   Opiates NONE DETECTED NONE DETECTED   Cocaine NONE DETECTED NONE DETECTED   Benzodiazepines NONE DETECTED NONE DETECTED   Amphetamines NONE DETECTED NONE DETECTED   Tetrahydrocannabinol NONE DETECTED NONE DETECTED   Barbiturates NONE DETECTED NONE DETECTED    Comment:        DRUG SCREEN FOR MEDICAL PURPOSES ONLY.  IF CONFIRMATION IS NEEDED FOR ANY PURPOSE, NOTIFY LAB WITHIN 5 DAYS.        LOWEST DETECTABLE LIMITS FOR URINE DRUG SCREEN Drug Class       Cutoff (ng/mL) Amphetamine      1000 Barbiturate      200 Benzodiazepine   200 Tricyclics       300 Opiates          300 Cocaine          300 THC              50   Glucose, capillary     Status: None   Collection Time: 05/01/15  7:46 AM  Result Value Ref Range   Glucose-Capillary 79 65 - 99 mg/dL   Comment 1 Notify RN    Comment 2 Document in Chart   Glucose, capillary     Status: None   Collection Time: 05/01/15 11:08 AM  Result Value Ref Range   Glucose-Capillary 85 65 - 99 mg/dL   Comment 1 Notify RN    Comment 2 Document in Chart   Glucose, capillary     Status: None   Collection Time: 05/01/15  4:34 PM  Result Value Ref Range   Glucose-Capillary 96 65 - 99 mg/dL   Comment 1 Notify RN    Comment 2 Document in Chart     Lipid Panel  Recent Labs  04/30/15 0543  CHOL 138  TRIG 117  HDL 32*  CHOLHDL 4.3  VLDL 23  LDLCALC 83    Studies/Results:   Medications:  Scheduled Meds: . aspirin EC  81 mg Oral Daily  . carvedilol  12.5 mg Oral BID  . cholecalciferol  2,000 Units Oral Daily  . clopidogrel  75 mg Oral Q breakfast   . enoxaparin (LOVENOX) injection  40 mg Subcutaneous Q24H  . omega-3 acid ethyl esters  2 g Oral BID  . pravastatin  80 mg Oral QPM   Continuous Infusions:  PRN Meds:.acetaminophen     LOS: 2 days   Obdulia Steier A. Gerilyn Pilgrim, M.D.  Diplomate, Biomedical engineer of Psychiatry and Neurology ( Neurology).

## 2015-05-02 LAB — GLUCOSE, CAPILLARY
GLUCOSE-CAPILLARY: 92 mg/dL (ref 65–99)
GLUCOSE-CAPILLARY: 99 mg/dL (ref 65–99)
Glucose-Capillary: 91 mg/dL (ref 65–99)
Glucose-Capillary: 113 mg/dL — ABNORMAL HIGH (ref 65–99)

## 2015-05-02 NOTE — Progress Notes (Signed)
[facility] PROGRESS NOTE Assessment/Plan: CVA: - Unable to obtain an MRI due to defibrillator, neurology was consulted who recommended to continue antiplatelet therapy. - Neurology concern of a cardioembolic stroke. - Cardiology was consulted, awaiting pacer interrogation. - may needs NOAC's.  Ischemic cardiomyopathy/ Chronic systolic CHF (congestive heart failure), NYHA class 3/ICM- new LVD 20-25% 07/13/14: - Status post ICD on 616, PCI with a DES to the RCA, continue aspirin Plavix Lasix and losartan. - Continue statin and Aldactone.  CAD- remote MI '90s, distal RCA DES 07/17/14    Code Status: full code DVT prophylaxis: Lovenox Family Communication: none present. Patient alert and understands plan. Disposition Plan: home in 24 hrs   Consultants:  Cardiology  Neurology  Procedures:  CT head  Antibiotics:  none  HPI/Subjective: No complains  Objective: Filed Vitals:   05/01/15 1800 05/01/15 2046 05/02/15 0008 05/02/15 0421  BP: 92/58 134/69 122/60 123/57  Pulse: 70 61 70 69  Temp:  98.6 F (37 C) 98.2 F (36.8 C) 97.9 F (36.6 C)  TempSrc:  Oral Oral Oral  Resp: 20 20 18 18   Height:      Weight:      SpO2: 99% 97% 98% 96%    Intake/Output Summary (Last 24 hours) at 05/02/15 0906 Last data filed at 05/02/15 0859  Gross per 24 hour  Intake    360 ml  Output    275 ml  Net     85 ml   Filed Weights   04/29/15 2157  Weight: 73.619 kg (162 lb 4.8 oz)    Exam:  General: Alert, awake, oriented x3, in no acute distress.  HEENT: No bruits, no goiter.  Heart: Regular rate and rhythm. Lungs: Good air movement, clear Abdomen: Soft, nontender, nondistended, positive bowel sounds.  Neuro: Grossly intact, nonfocal.   Data Reviewed: Basic Metabolic Panel:  Recent Labs Lab 04/29/15 1707  NA 135  K 3.7  CL 105  CO2 22  GLUCOSE 107*  BUN 10  CREATININE 0.90  CALCIUM 9.0   Liver Function Tests:  Recent Labs Lab 04/29/15 1707  AST  17  ALT 11*  ALKPHOS 74  BILITOT 0.8  PROT 6.7  ALBUMIN 3.6   No results for input(s): LIPASE, AMYLASE in the last 168 hours. No results for input(s): AMMONIA in the last 168 hours. CBC:  Recent Labs Lab 04/29/15 1707  WBC 8.7  NEUTROABS 5.1  HGB 12.7  HCT 38.2  MCV 89.9  PLT 180   Cardiac Enzymes:  Recent Labs Lab 04/30/15 1124  TROPONINI <0.03   BNP (last 3 results) No results for input(s): BNP in the last 8760 hours.  ProBNP (last 3 results)  Recent Labs  07/12/14 1555 07/14/14 0218 07/19/14 0325  PROBNP 8927.0* 2022.0* 708.7*    CBG:  Recent Labs Lab 05/01/15 0746 05/01/15 1108 05/01/15 1634 05/01/15 2115 05/02/15 0755  GLUCAP 79 85 96 98 92    No results found for this or any previous visit (from the past 240 hour(s)).   Studies: Ct Angio Head W/cm &/or Wo Cm  04/30/2015   CLINICAL DATA:  Right-sided facial droop.  Slurred speech.  TIA.  EXAM: CT ANGIOGRAPHY HEAD  TECHNIQUE: Multidetector CT imaging of the head was performed using the standard protocol during bolus administration of intravenous contrast. Multiplanar CT image reconstructions and MIPs were obtained to evaluate the vascular anatomy.  CONTRAST:  21mL OMNIPAQUE IOHEXOL 350 MG/ML SOLN  COMPARISON:  CT head without contrast 04/29/2015. Carotid ultrasound 04/30/2015.  FINDINGS: CT HEAD  Brain: The source images demonstrate no acute or focal cortical infarct. The basal ganglia are intact. The insular ribbon is within normal limits.  Calvarium and skull base: Negative  Paranasal sinuses: Clear  Orbits: Within normal limits.  CTA HEAD  Anterior circulation: Minimal atherosclerotic calcifications are present within the precavernous internal carotid arteries bilaterally. The internal carotid arteries are otherwise normal. The A1 and M1 segments are within normal limits. The anterior communicating artery is patent. There is minimal attenuation of distal MCA branch vessels without significant proximal  stenosis or occlusion.  Posterior circulation: The left vertebral artery is slightly dominant to the right. The right PICA origin is below the dural margin. The left PICA is not visualized. A prominent left AICA vessel is present. The basilar artery is small. Posterior communicating arteries are present bilaterally. The posterior cerebral arteries originate from these posterior communicating arteries and P1 segments. The PCA branch vessels are intact.  Venous sinuses: The dural sinuses are patent. The straight sinus and deep cerebral veins are normal.  Anatomic variants: Prominent posterior communicating arteries contribute to the posterior cerebral arteries with small P1 segments.  Delayed phase:No pathologic enhancement is present.  IMPRESSION: 1. Normal variant MRA circle of Willis without significant proximal stenosis, aneurysm, or branch vessel occlusion. 2. Mild distal small vessel disease is suggested.   Electronically Signed   By: Marin Roberts M.D.   On: 04/30/2015 11:22    Scheduled Meds: . aspirin EC  81 mg Oral Daily  . carvedilol  12.5 mg Oral BID  . cholecalciferol  2,000 Units Oral Daily  . clopidogrel  75 mg Oral Q breakfast  . enoxaparin (LOVENOX) injection  40 mg Subcutaneous Q24H  . omega-3 acid ethyl esters  2 g Oral BID  . pravastatin  80 mg Oral QPM   Continuous Infusions:   Time Spent: 25 min   Marinda Elk  [facility] Pager [phone]. If 7PM-7AM, please contact night-coverage at [email or web address], password [facility] 05/02/2015, 9:06 AM  LOS: 3 days

## 2015-05-02 NOTE — Progress Notes (Signed)
Patient ID: Nicole Clements, female   DOB: 1941/01/13, 74 y.o.   MRN: [id number]  [facility] NEUROLOGY Maleiya Clements A. Gerilyn Pilgrim, MD     [email or web address]          Nicole Clements is an 74 y.o. female.   Assessment/Plan: 1. 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 Given the patient's cardiac history and the unremarkable imaging of the intracranial and extracranial vessels, I am concerned that the patient likely has a cardioembolic phenomenon-- cardiac monitoring fine. We will continue with the dual antiplatelet agents of aspirin and Plavix. FU 2 months  2. Ischemic cardiomyopathy. 3. Hypertension. 4. Dyslipidemia.  Cardiac monitoring does not show evidence of atrial arrhythmias.   GENERAL: This is a pleasant female in no acute distress.  HEENT: Supple. Atraumatic normocephalic.   EXTREMITIES: No edema   BACK: Normal.  SKIN: Normal by inspection.   MENTAL STATUS: She is awake and alert. The patient's language has improved significantly. Fluency is slightly improved but she has better recognition/comprehension and is able to name 5/5 objects.   CRANIAL NERVES: Pupils are equal, round and reactive to light and accommodation; extra ocular movements are full, there is no significant nystagmus; visual fields are full; upper and lower facial muscles are normal in strength and symmetric, there is no flattening of the nasolabial folds; tongue is midline; uvula is midline; shoulder elevation is normal. No extinction to double simultaneous visual stimulation.  MOTOR: Normal tone, bulk and strength; no pronator drift.      Objective: Vital signs in last 24 hours: Temp:  [97.7 F (36.5 C)-98.6 F (37 C)] 98.1 F (36.7 C) (06/29 1840) Pulse Rate:  [61-72] 66 (06/29 1840) Resp:  [18-20] 18 (06/29 1840) BP: (103-134)/(56-69) 114/62 mmHg (06/29 1840) SpO2:  [96 %-98 %] 98 % (06/29 1840)  Intake/Output from previous day: 06/28 0701 - 06/29 0700 In: -  Out: 275  [Urine:275] Intake/Output this shift: Total I/O In: 840 [P.O.:840] Out: 200 [Urine:200] Nutritional status: Diet Heart Room service appropriate?: Yes; Fluid consistency:: Thin   Lab Results: Results for orders placed or performed during the hospital encounter of 04/29/15 (from the past 48 hour(s))  Glucose, capillary     Status: None   Collection Time: 04/30/15  9:19 PM  Result Value Ref Range   Glucose-Capillary 76 65 - 99 mg/dL   Comment 1 Notify RN   Urine rapid drug screen (hosp performed)not at [facility]     Status: None   Collection Time: 04/30/15 11:10 PM  Result Value Ref Range   Opiates NONE DETECTED NONE DETECTED   Cocaine NONE DETECTED NONE DETECTED   Benzodiazepines NONE DETECTED NONE DETECTED   Amphetamines NONE DETECTED NONE DETECTED   Tetrahydrocannabinol NONE DETECTED NONE DETECTED   Barbiturates NONE DETECTED NONE DETECTED    Comment:        DRUG SCREEN FOR MEDICAL PURPOSES ONLY.  IF CONFIRMATION IS NEEDED FOR ANY PURPOSE, NOTIFY LAB WITHIN 5 DAYS.        LOWEST DETECTABLE LIMITS FOR URINE DRUG SCREEN Drug Class       Cutoff (ng/mL) Amphetamine      1000 Barbiturate      200 Benzodiazepine   200 Tricyclics       300 Opiates          300 Cocaine          300 THC              50   Glucose, capillary  Status: None   Collection Time: 05/01/15  7:46 AM  Result Value Ref Range   Glucose-Capillary 79 65 - 99 mg/dL   Comment 1 Notify RN    Comment 2 Document in Chart   Glucose, capillary     Status: None   Collection Time: 05/01/15 11:08 AM  Result Value Ref Range   Glucose-Capillary 85 65 - 99 mg/dL   Comment 1 Notify RN    Comment 2 Document in Chart   Glucose, capillary     Status: None   Collection Time: 05/01/15  4:34 PM  Result Value Ref Range   Glucose-Capillary 96 65 - 99 mg/dL   Comment 1 Notify RN    Comment 2 Document in Chart   Glucose, capillary     Status: None   Collection Time: 05/01/15  9:15 PM  Result Value Ref Range    Glucose-Capillary 98 65 - 99 mg/dL   Comment 1 Notify RN    Comment 2 Document in Chart   Glucose, capillary     Status: None   Collection Time: 05/02/15  7:55 AM  Result Value Ref Range   Glucose-Capillary 92 65 - 99 mg/dL  Glucose, capillary     Status: None   Collection Time: 05/02/15 11:23 AM  Result Value Ref Range   Glucose-Capillary 91 65 - 99 mg/dL  Glucose, capillary     Status: None   Collection Time: 05/02/15  4:35 PM  Result Value Ref Range   Glucose-Capillary 99 65 - 99 mg/dL    Lipid Panel  Recent Labs  04/30/15 0543  CHOL 138  TRIG 117  HDL 32*  CHOLHDL 4.3  VLDL 23  LDLCALC 83    Studies/Results:   Medications:  Scheduled Meds: . aspirin EC  81 mg Oral Daily  . carvedilol  12.5 mg Oral BID  . cholecalciferol  2,000 Units Oral Daily  . clopidogrel  75 mg Oral Q breakfast  . enoxaparin (LOVENOX) injection  40 mg Subcutaneous Q24H  . omega-3 acid ethyl esters  2 g Oral BID  . pravastatin  80 mg Oral QPM   Continuous Infusions:  PRN Meds:.acetaminophen     LOS: 3 days   Prairie Stenberg A. Gerilyn Pilgrim, M.D.  Diplomate, Biomedical engineer of Psychiatry and Neurology ( Neurology).

## 2015-05-02 NOTE — Progress Notes (Signed)
Speech Language Pathology Treatment:    Patient Details Name: Nicole Clements MRN: [id number] DOB: [birth year] Today's Date: 05/02/2015 Time: 1505-1600 SLP Time Calculation (min) (ACUTE ONLY): 55 min  Assessment / Plan / Recommendation Clinical Impression  Nicole Clements was seen in room while she was sitting up in chair. She states that she is ready to go home, but doesn't know when this will be or what they are waiting on. Pt seen for ongoing diagnostic aphasia intervention and pt education. Receptive language skills have improved significantly; mild decreased reading comprehension (pt requires re-read and extra time). Expressively, she had made gains as well but still has difficulty with confrontation naming, generational naming, and answering moderately complex open-ended questions. She was given written information regarding aphasia and apraxia which was reviewed and questions answered. She was also given word finding strategies and implemented in functional task with moderate assist. Pt would benefit from ongoing education regarding aphasia and compensatory strategies as well as skilled SLP intervention in outpatient setting to maximize communication skills. Pt agreeable to plan of care, but states that she might be going up to stay with her daughter in [place] for a few weeks. Pt will need supervision handling medications, paying bills/finances, and driving initially to see how she adjusts. Insight and problem solving appear adequate, but she will need some support initially.   HPI Other Pertinent Information: Nicole Clements is a 74 year old woman with a past medical history of ischemic cardiomyopathy, coronary artery disease with stent placement, ICD placement 6/16 presented to the emergency department with history of expressive aphasia which was confirmed on examination in the emergency department, no other focal deficits were noted. Not a candidate for TPA secondary to delay in presentation. Compliant with  aspirin Plavix. CT head was negative. She was referred for stroke evaluation. MRI not obtainable secondary to defibrillator placement. Neurologist notes "Acute infarct involving the left operculum temporal region. Given the patient's cardiac history and the unremarkable imaging of the intracranial and extracranial vessels, I am concerned that the patient likely has a cardioembolic phenomenon. SLP asked to evaluate pt as part of stroke protocol. Pt passed RN swallow screen, but does have expressive aphasia.   Pertinent Vitals    SLP Plan  Continue with current plan of care    Recommendations                Follow up Recommendations: Outpatient SLP Plan: Continue with current plan of care  Thank you,  Havery Moros, CCC-SLP [phone]      Mintie Witherington 05/02/2015, 4:18 PM

## 2015-05-02 NOTE — Progress Notes (Signed)
    Primary cardiologist: Dr. Dina Rich  Seen for followup: Recent stroke  Subjective:    Improving expressive aphasia, no focal motor weakness. No palpitations or chest pain.  Objective:   Temp:  [97.8 F (36.6 C)-98.6 F (37 C)] 97.9 F (36.6 C) (06/29 0421) Pulse Rate:  [61-70] 69 (06/29 0421) Resp:  [18-20] 18 (06/29 0421) BP: (92-134)/(57-69) 123/57 mmHg (06/29 0421) SpO2:  [96 %-99 %] 96 % (06/29 0421) Last BM Date: 05/01/15  Filed Weights   04/29/15 2157  Weight: 162 lb 4.8 oz (73.619 kg)    Intake/Output Summary (Last 24 hours) at 05/02/15 0938 Last data filed at 05/02/15 0859  Gross per 24 hour  Intake    360 ml  Output    275 ml  Net     85 ml    Telemetry: Dual-chamber paced rhythm.  Exam:  General: Appears comfortable at rest.  Lungs: Clear, nonlabored.  Thorax: Stable device pocket site on left.  Cardiac: RRR without gallop.  Extremities: No pitting edema.   Lab Results:  Basic Metabolic Panel:  Recent Labs Lab 04/29/15 1707  NA 135  K 3.7  CL 105  CO2 22  GLUCOSE 107*  BUN 10  CREATININE 0.90  CALCIUM 9.0    CBC:  Recent Labs Lab 04/29/15 1707  WBC 8.7  HGB 12.7  HCT 38.2  MCV 89.9  PLT 180    Cardiac Enzymes:  Recent Labs Lab 04/30/15 1124  TROPONINI <0.03    BNP:  Recent Labs  07/12/14 1555 07/14/14 0218 07/19/14 0325  PROBNP 8927.0* 2022.0* 708.7*    Medications:   Scheduled Medications: . aspirin EC  81 mg Oral Daily  . carvedilol  12.5 mg Oral BID  . cholecalciferol  2,000 Units Oral Daily  . clopidogrel  75 mg Oral Q breakfast  . enoxaparin (LOVENOX) injection  40 mg Subcutaneous Q24H  . omega-3 acid ethyl esters  2 g Oral BID  . pravastatin  80 mg Oral QPM     PRN Medications:  acetaminophen   Assessment:   1. Status post stroke with expressive aphasia, left operculum temporal region per Dr. Gerilyn Pilgrim. Slowly improving. No clear evidence of cardioembolic phenomenon with stable  dual-chamber pacing by telemetry, and formal device interrogation done yesterday showing normal device function with no evidence of arrhythmia and pacing 94% of the time. No obvious PFO or ASD by echocardiogram.  2. CAD status post DES to the RCA in 2015, symptomatically stable. She remains on dual antiplatelets therapy.  3. Status post recent prophylactic [employer] ICD implantation by Dr. Graciela Husbands on June 15.  4. Ischemic cardiomyopathy with LVEF 20-25%.   Plan/Discussion:    Discussed with patient. Since there is no clear evidence of atrial arrhythmias, would not initiate anticoagulation, and otherwise plan to continue her aspirin and Plavix. We will reschedule her device follow-up/wound check with Dr. Graciela Husbands. No further cardiac testing at this time.   Jonelle Sidle, M.D., F.A.C.C.

## 2015-05-03 LAB — GLUCOSE, CAPILLARY
Glucose-Capillary: 78 mg/dL (ref 65–99)
Glucose-Capillary: 80 mg/dL (ref 65–99)

## 2015-05-03 NOTE — Care Management (Signed)
Important Message  Patient Details  Name: Nicole Clements MRN: [id number] Date of Birth: Oct 05, 1941   Medicare Important Message Given:  Yes-second notification given    Cheryl Flash, RN 05/03/2015, 1:00 PM

## 2015-05-03 NOTE — Care Management Note (Signed)
Case Management Note  Patient Details  Name: [name] MRN: [id number] Date of Birth: 05-06-1941  Subjective/Objective:                    Action/Plan:   Expected Discharge Date:                  Expected Discharge Plan:  Home/Self Care  In-House Referral:  NA  Discharge planning Services  CM Consult  Post Acute Care Choice:  Home Health Choice offered to:  Patient  DME Arranged:    DME Agency:     HH Arranged:  PT, OT, Speech Therapy HH Agency:  [facility]  Status of Service:  Completed, signed off  Medicare Important Message Given:    Date Medicare IM Given:    Medicare IM give by:    Date Additional Medicare IM Given:    Additional Medicare Important Message give by:     If discussed at Long Length of Stay Meetings, dates discussed:    Additional Comments: Pt discharged home today with [facility] PT,OT, and ST (per pts choice). Alroy Bailiff of [facility] is aware and will collect pts information from the chart. HH services to start within 48 hours of discharge. No DME needs noted. Pt very alert and oriented at this time. Pt and pts nurse aware of discharge arrangements. VM left for pts daughter to update on discharge plan with pts approval. Cheryl Flash, RN 05/03/2015, 12:57 PM

## 2015-05-03 NOTE — Progress Notes (Signed)
Pt discharged home today per Dr. David Stall with home health.  Pt's VSS.  Pt's IV site D/C'd and WDL.  Pt provided with home medication list and discharge instructions and prescriptions.  Verbalized understanding.  Pt left floor via WC in stable condition accompanied by NT.

## 2015-05-03 NOTE — Discharge Summary (Signed)
Physician Discharge Summary  Nicole Clements ZOX:[id number] DOB: 1941-04-12 DOA: 04/29/2015  PCP: Lupita Raider, MD  Admit date: 04/29/2015 Discharge date: 05/03/2015  Time spent: 40 minutes  Recommendations for Outpatient Follow-up:  1. Follow up with dr Gerilyn Pilgrim 4-8 weeks.  2. Follow up with PCP in 1-2 weeks 3. Speech therapy  Discharge Diagnoses:  Principal Problem:   Stroke Active Problems:   CAD- remote MI '90s, distal RCA DES 07/17/14   ICM- new LVD 20-25% 07/13/14   Chronic systolic CHF (congestive heart failure), NYHA class 3   Ventricular tachycardia, non-sustained   Expressive aphasia   Discharge Condition: stable  Diet recommendation: heart healthy  Filed Weights   04/29/15 2157  Weight: 73.619 kg (162 lb 4.8 oz)    History of present illness:  Nicole Clements is an 74 y.o. 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 Family noted she had some facial droop as well. In the ER her speech was better and her facial droop improved. She had been taking her DAPT (plavix and ASA), and hadn't missed her Plavix at all. She denied chest pain, HA, and had been able to ambulate. ER work up included EKG with NSR and RBBB, head CT showed no acute infarct, and renal fx tests were normal with normal CBC.   Hospital Course:  CVA: - Unable to obtain an MRI due to defibrillator, neurology was consulted who recommended to continue dual antiplatelet therapy and follow up in 2 months. Pacer interrogation unremarkable.  Evaluated by Dr Lestine Mount who opines acute infarct involving left operculum temporal region and concerns for cardioemblic phenomenon. Telemetry rhythm downloaded fro monitor/defibrilator. Discussed with cards.  Evaluated  by Cardiology who opine no clear evidence of atrial arrhythmias and not anticoag recommended. Expressive aphasia per ST and OP therapy recommended. Evaluated by PT who ecommend no follow up.  Continue statin.   Ischemic cardiomyopathy/ Chronic systolic CHF (congestive heart failure), NYHA class 3/ICM- new LVD 20-25% 07/13/14: - Status post ICD on 616, PCI with a DES to the RCA, continue aspirin Plavix Lasix and losartan. - Continue statin and Aldactone. -follow up with Dr Marikay Alar as instructed -evaluated by cardiology. See above.    CAD- remote MI '90s, distal RCA DES 07/17/14  Procedures:  Echo 04/2015 Wall thickness was normal. Systolic function was severely reduced. The estimated ejection fraction was in the range of 20% to 25%. Doppler parameters are consistent with abnormal left ventricular relaxation (grade 1 diastolic dysfunction).  Consultations:  Cardiology  neurology  Discharge Exam: Filed Vitals:   05/03/15 0403  BP: 131/68  Pulse: 72  Temp: 97.8 F (36.6 C)  Resp: 18    General: well nourished appears well Cardiovascular: RRR  No gallup Respiratory: normal effort BS clear bilaterally  Discharge Instructions    Current Discharge Medication List    CONTINUE these medications which have NOT CHANGED   Details  acetaminophen (TYLENOL) 325 MG tablet Take 2 tablets (650 mg total) by mouth every 4 (four) hours as needed for headache or mild pain.    aspirin EC 81 MG EC tablet Take 1 tablet (81 mg total) by mouth daily.    carvedilol (COREG) 25 MG tablet Take 0.5 tablets (12.5 mg total) by mouth 2 (  two) times daily. Qty: 30 tablet, Refills: 6    Cholecalciferol (VITAMIN D3) 2000 UNITS capsule Take 2,000 Units by mouth daily.      clopidogrel (PLAVIX) 75 MG tablet Take 1 tablet (75 mg total) by mouth daily with breakfast. Qty: 30 tablet, Refills: 11    furosemide (LASIX) 80 MG tablet Take 1 tablet (80 mg total) by mouth daily. Qty: 30 tablet, Refills:  11    losartan (COZAAR) 25 MG tablet Take 1 tablet (25 mg total) by mouth daily. Qty: 90 tablet, Refills: 3    nitroGLYCERIN (NITROSTAT) 0.4 MG SL tablet Place 1 tablet (0.4 mg total) under the tongue every 5 (five) minutes x 3 doses as needed for chest pain. Qty: 25 tablet, Refills: 2    Omega-3 Fatty Acids (FISH OIL) 1000 MG CAPS Take 1,000 mg by mouth daily.     potassium chloride SA (KLOR-CON M20) 20 MEQ tablet Take 1 tablet (20 mEq total) by mouth daily. Qty: 90 tablet, Refills: 3    pravastatin (PRAVACHOL) 80 MG tablet Take 1 tablet (80 mg total) by mouth every evening. Qty: 90 tablet, Refills: 3    spironolactone (ALDACTONE) 25 MG tablet Take 12.5 mg by mouth 2 (two) times daily.       Allergies  Allergen Reactions  . Lactose Intolerance (Gi) Other (See Comments)    GI Upset   . Sulfa Antibiotics Rash   Follow-up Information    Follow up with Lupita Raider, MD.   Specialty:  Family Medicine   Contact information:   [address] [place] [address] [phone]       Follow up with Beryle Beams, MD.   Specialty:  Neurology   Contact information:   [address] [place] [address] [phone]        The results of significant diagnostics from this hospitalization (including imaging, microbiology, ancillary and laboratory) are listed below for reference.    Significant Diagnostic Studies: Ct Angio Head W/cm &/or Wo Cm  04/30/2015   CLINICAL DATA:  Right-sided facial droop.  Slurred speech.  TIA.  EXAM: CT ANGIOGRAPHY HEAD  TECHNIQUE: Multidetector CT imaging of the head was performed using the standard protocol during bolus administration of intravenous contrast. Multiplanar CT image reconstructions and MIPs were obtained to evaluate the vascular anatomy.  CONTRAST:  75mL OMNIPAQUE IOHEXOL 350 MG/ML SOLN  COMPARISON:  CT head without contrast 04/29/2015. Carotid ultrasound 04/30/2015.  FINDINGS: CT HEAD  Brain: The source images  demonstrate no acute or focal cortical infarct. The basal ganglia are intact. The insular ribbon is within normal limits.  Calvarium and skull base: Negative  Paranasal sinuses: Clear  Orbits: Within normal limits.  CTA HEAD  Anterior circulation: Minimal atherosclerotic calcifications are present within the precavernous internal carotid arteries bilaterally. The internal carotid arteries are otherwise normal. The A1 and M1 segments are within normal limits. The anterior communicating artery is patent. There is minimal attenuation of distal MCA branch vessels without significant proximal stenosis or occlusion.  Posterior circulation: The left vertebral artery is slightly dominant to the right. The right PICA origin is below the dural margin. The left PICA is not visualized. A prominent left AICA vessel is present. The basilar artery is small. Posterior communicating arteries are present bilaterally. The posterior cerebral arteries originate from these posterior communicating arteries and P1 segments. The PCA branch vessels are intact.  Venous sinuses: The dural sinuses are patent. The straight sinus and deep cerebral veins are normal.  Anatomic variants: Prominent posterior communicating arteries contribute to the posterior cerebral arteries with small P1 segments.  Delayed phase:No pathologic enhancement is present.  IMPRESSION: 1. Normal variant MRA circle of Willis without significant proximal stenosis, aneurysm, or branch vessel occlusion. 2. Mild distal small vessel disease is suggested.   Electronically Signed   By: Marin Roberts M.D.   On: 04/30/2015 11:22   Dg Chest 2 View  04/19/2015   CLINICAL DATA:  Cardiac arrhythmias; defibrillator placement  EXAM: CHEST  2 VIEW  COMPARISON:  July 12, 2014 and January 02, 2013  FINDINGS: Defibrillator leads extend to the right atrium, middle cardiac vein, and left ventricle. No pneumothorax. Heart is mildly enlarged with pulmonary vascular within normal  limits. There are areas of scarring in the left mid and lower lung zones. Elsewhere lungs are clear. No adenopathy. There is degenerative change in the thoracic spine.  IMPRESSION: Scarring left mid lower lung zones. Lungs elsewhere clear. Defibrillator leads as described. No pneumothorax. Heart mildly enlarged but stable.   Electronically Signed   By: Bretta Bang III M.D.   On: 04/19/2015 08:22   Ct Head Wo Contrast  04/29/2015   CLINICAL DATA:  Aphasia.  Slurred speech.  Right facial droop.  EXAM: CT HEAD WITHOUT CONTRAST  TECHNIQUE: Contiguous axial images were obtained from the base of the skull through the vertex without intravenous contrast.  COMPARISON:  None.  FINDINGS: No intracranial hemorrhage, mass effect, or midline shift. No hydrocephalus. The basilar cisterns are patent. No evidence of territorial infarct. No intracranial fluid collection. Calvarium is intact. Included paranasal sinuses and mastoid air cells are well aerated.  IMPRESSION: No acute intracranial abnormality on noncontrast head CT.   Electronically Signed   By: Rubye Oaks M.D.   On: 04/29/2015 17:47   US Carotid Bilateral  04/30/2015   CLINICAL DATA:  74 year old female with symptoms of transient ischemic attack  EXAM: BILATERAL CAROTID DUPLEX ULTRASOUND  TECHNIQUE: Wallace Cullens scale imaging, color Doppler and duplex ultrasound were performed of bilateral carotid and vertebral arteries in the neck.  COMPARISON:  Head CT 04/29/2015 ; duplex carotid ultrasound 04/11/2015  FINDINGS: Criteria: Quantification of carotid stenosis is based on velocity parameters that correlate the residual internal carotid diameter with NASCET-based stenosis levels, using the diameter of the distal internal carotid lumen as the denominator for stenosis measurement.  The following velocity measurements were obtained:  RIGHT  ICA:  86/20 cm/sec  CCA:  75/15 cm/sec  SYSTOLIC ICA/CCA RATIO:  1.1  DIASTOLIC ICA/CCA RATIO:  1.3  ECA:  88 cm/sec  LEFT  ICA:   94/27 cm/sec  CCA:  66/13 cm/sec  SYSTOLIC ICA/CCA RATIO:  1.4  DIASTOLIC ICA/CCA RATIO:  2.1  ECA:  80 cm/sec  RIGHT CAROTID ARTERY: Smooth heterogeneous atherosclerotic plaque beginning in the distal common carotid artery and extending into the internal carotid artery. By peak systolic velocity criteria the estimated stenosis remains less than 50%. No interval change compared to recent prior imaging.  RIGHT VERTEBRAL ARTERY:  Patent with normal antegrade flow.  LEFT CAROTID ARTERY: Smooth heterogeneous atherosclerotic plaque throughout the common carotid artery extending into the internal carotid artery. By peak systolic velocity criteria, the estimated stenosis remains less than 50%. Of note, the plaque in the proximal internal carotid artery is irregular.  LEFT VERTEBRAL ARTERY:  Patent with normal antegrade flow.  IMPRESSION: 1. Mild (1-49%) stenosis of the proximal right internal carotid artery secondary to smooth heterogeneous atherosclerotic plaque. No interval change compared to recent prior  imaging from earlier this month. 2. Mild (1-49%) stenosis of the proximal left internal carotid artery secondary to heterogeneous and irregular atherosclerotic plaque. No interval change compared to recent prior imaging from earlier this month. 3. Vertebral arteries remain patent with normal antegrade flow. Signed,  Sterling Big, MD  [facility]  [facility]   Electronically Signed   By: Malachy Moan M.D.   On: 04/30/2015 08:37   US Carotid Duplex Bilateral  04/11/2015   CLINICAL DATA:  Carotid bruits. Hypertension, coronary artery disease, tobacco abuse.  EXAM: BILATERAL CAROTID DUPLEX ULTRASOUND  TECHNIQUE: Wallace Cullens scale imaging, color Doppler and duplex ultrasound was performed of bilateral carotid and vertebral arteries in the neck.  COMPARISON:  None.  REVIEW OF SYSTEMS: Quantification of carotid stenosis is based on velocity parameters that correlate the  residual internal carotid diameter with NASCET-based stenosis levels, using the diameter of the distal internal carotid lumen as the denominator for stenosis measurement.  The following velocity measurements were obtained:  PEAK SYSTOLIC/END DIASTOLIC  RIGHT  ICA:                     120/22cm/sec  CCA:                     89 /12cm/sec  SYSTOLIC ICA/CCA RATIO:  1.3  DIASTOLIC ICA/CCA RATIO: 1.8  ECA:                     142cm/sec  LEFT  ICA:                     122/31cm/sec  CCA:                     120 /21cm/sec  SYSTOLIC ICA/CCA RATIO:  1.0  DIASTOLIC ICA/CCA RATIO: 1.5  ECA:                     143cm/sec  FINDINGS: RIGHT CAROTID ARTERY: Diffuse intimal thickening through the common carotid artery and bulb. Mild eccentric partially calcified plaque in the proximal ICA without high-grade stenosis. Normal waveforms and color Doppler signal.  RIGHT VERTEBRAL ARTERY:  Normal flow direction and waveform.  LEFT CAROTID ARTERY: Scattered plaque through the common carotid artery without significant stenosis. Eccentric somewhat irregular and partially calcified plaque in the carotid bulb extending into the proximal ICA, without high-grade stenosis. Normal waveforms and color Doppler signal.  LEFT VERTEBRAL ARTERY: Normal flow direction and waveform.  IMPRESSION: 1. Bilateral carotid bifurcation and proximal ICA plaque, left greater than right, resulting in less than 50% diameter stenosis. The exam does not exclude plaque ulceration or embolization. Continued surveillance recommended.   Electronically Signed   By: Corlis Leak M.D.   On: 04/11/2015 15:53    Microbiology: No results found for this or any previous visit (from the past 240 hour(s)).   Labs: Basic Metabolic Panel:  Recent Labs Lab 04/29/15 1707  NA 135  K 3.7  CL 105  CO2 22  GLUCOSE 107*  BUN 10  CREATININE 0.90  CALCIUM 9.0   Liver Function Tests:  Recent Labs Lab 04/29/15 1707  AST 17  ALT 11*  ALKPHOS 74  BILITOT 0.8  PROT 6.7   ALBUMIN 3.6   No results for input(s): LIPASE, AMYLASE in the last 168 hours. No results for input(s): AMMONIA in the last 168 hours. CBC:  Recent Labs Lab 04/29/15 1707  WBC  8.7  NEUTROABS 5.1  HGB 12.7  HCT 38.2  MCV 89.9  PLT 180   Cardiac Enzymes:  Recent Labs Lab 04/30/15 1124  TROPONINI <0.03   BNP: BNP (last 3 results) No results for input(s): BNP in the last 8760 hours.  ProBNP (last 3 results)  Recent Labs  07/12/14 1555 07/14/14 0218 07/19/14 0325  PROBNP 8927.0* 2022.0* 708.7*    CBG:  Recent Labs Lab 05/02/15 1123 05/02/15 1635 05/02/15 2041 05/03/15 0753 05/03/15 1125  GLUCAP 91 99 113* 78 80       Signed:  BLACK,KAREN M  [facility] 05/03/2015, 11:44 AM

## 2015-05-04 DIAGNOSIS — I5022 Chronic systolic (congestive) heart failure: Secondary | ICD-10-CM | POA: Diagnosis not present

## 2015-05-04 DIAGNOSIS — I2510 Atherosclerotic heart disease of native coronary artery without angina pectoris: Secondary | ICD-10-CM | POA: Diagnosis not present

## 2015-05-04 DIAGNOSIS — I69320 Aphasia following cerebral infarction: Secondary | ICD-10-CM | POA: Diagnosis not present

## 2015-05-04 DIAGNOSIS — I252 Old myocardial infarction: Secondary | ICD-10-CM | POA: Diagnosis not present

## 2015-05-04 DIAGNOSIS — E785 Hyperlipidemia, unspecified: Secondary | ICD-10-CM | POA: Diagnosis not present

## 2015-05-04 DIAGNOSIS — I255 Ischemic cardiomyopathy: Secondary | ICD-10-CM | POA: Diagnosis not present

## 2015-05-04 DIAGNOSIS — I479 Paroxysmal tachycardia, unspecified: Secondary | ICD-10-CM | POA: Diagnosis not present

## 2015-05-04 DIAGNOSIS — Z720 Tobacco use: Secondary | ICD-10-CM | POA: Diagnosis not present

## 2015-05-08 ENCOUNTER — Ambulatory Visit (INDEPENDENT_AMBULATORY_CARE_PROVIDER_SITE_OTHER): Payer: Medicare Other | Admitting: Cardiology

## 2015-05-08 ENCOUNTER — Encounter: Payer: Self-pay | Admitting: Cardiology

## 2015-05-08 VITALS — BP 110/64 | HR 71 | Ht 63.0 in | Wt 161.0 lb

## 2015-05-08 DIAGNOSIS — I5022 Chronic systolic (congestive) heart failure: Secondary | ICD-10-CM

## 2015-05-08 DIAGNOSIS — I2510 Atherosclerotic heart disease of native coronary artery without angina pectoris: Secondary | ICD-10-CM

## 2015-05-08 DIAGNOSIS — I255 Ischemic cardiomyopathy: Secondary | ICD-10-CM | POA: Diagnosis not present

## 2015-05-08 DIAGNOSIS — E785 Hyperlipidemia, unspecified: Secondary | ICD-10-CM

## 2015-05-08 DIAGNOSIS — I479 Paroxysmal tachycardia, unspecified: Secondary | ICD-10-CM | POA: Diagnosis not present

## 2015-05-08 DIAGNOSIS — I69320 Aphasia following cerebral infarction: Secondary | ICD-10-CM | POA: Diagnosis not present

## 2015-05-08 MED ORDER — CARVEDILOL 25 MG PO TABS: 25.0000 mg | ORAL_TABLET | Freq: Two times a day (BID) | ORAL | Status: DC

## 2015-05-08 NOTE — Progress Notes (Signed)
Clinical Summary Nicole Clements is a 74 y.o.female seen today for follow up of the following medical problems.   1. 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 Recent admit 07/2014 with CHF and troponin elevation - cath 07/2014, LM 30% distal, LAD mid 50%, LCX ostial 50% and mid 50%, small intermediate Nicole Clements 70%. RCA diffuse 50%, distal RCA with ulcerated 90% lesion, PDA 80-90% too small for PCI. LVEF 30%. Received DES to distal RCA, post procedure the PDA became occluded.  - echo 07/2014 LVEF 20%, inferior akinesis, grade II diastolic dysfunction. Repeat echo 10/2014 LVEF remains 15-20%.  - 04/2015 CRT-D device placed by Dr Graciela Husbands  - no chest pain, no SOB. No LE edema, no orthopnea - limiting sodium intake. Avoiding NSAIDs. - compliant with meds. Dr Graciela Husbands decreased coreg 12.5mg  bid due to bradycardia prior to her ICD placement. - check weights, typically 158-159 lbs. - compliant with meds.    2. Mitral regurgitation - mild to moderate by echo - denies any significant SOB or LE edema  3. Hyperlipidemia - atorvastatin too expensive, she has not been able to get at home.  - compliant with pravastatin 80mg  daily  4. CVA - admit late 04/2015 with CVA - recs by neurology to continue DAPT - ICD evaluated, no evidence of afib or aflutter   Past Medical History  Diagnosis Date  . STEMI (ST elevation myocardial infarction) 07/22/1996  . CAD (coronary artery disease) April 2014    DES to PLA, occluded PDA 07/2014  . Hypertension   . Hypercholesterolemia   . S/P colonoscopy August 2007    Hyperplastic polyps, rare sigmoid and descending colon diverticulosis, small internal hemorrhoids  . CHF (congestive heart failure)   . Ischemic cardiomyopathy     LVEF 25%-45%  . PVC's (premature ventricular contractions)   . IVCD (intraventricular conduction defect)      Allergies  Allergen Reactions  . Lactose Intolerance (Gi) Other (See Comments)    GI Upset   . Sulfa  Antibiotics Rash     Current Outpatient Prescriptions  Medication Sig Dispense Refill  . acetaminophen (TYLENOL) 325 MG tablet Take 2 tablets (650 mg total) by mouth every 4 (four) hours as needed for headache or mild pain.    [name] aspirin EC 81 MG EC tablet Take 1 tablet (81 mg total) by mouth daily.    . carvedilol (COREG) 25 MG tablet Take 0.5 tablets (12.5 mg total) by mouth 2 (two) times daily. 30 tablet 6  . Cholecalciferol (VITAMIN D3) 2000 UNITS capsule Take 2,000 Units by mouth daily.      . clopidogrel (PLAVIX) 75 MG tablet Take 1 tablet (75 mg total) by mouth daily with breakfast. 30 tablet 11  . furosemide (LASIX) 80 MG tablet Take 1 tablet (80 mg total) by mouth daily. 30 tablet 11  . losartan (COZAAR) 25 MG tablet Take 1 tablet (25 mg total) by mouth daily. 90 tablet 3  . nitroGLYCERIN (NITROSTAT) 0.4 MG SL tablet Place 1 tablet (0.4 mg total) under the tongue every 5 (five) minutes x 3 doses as needed for chest pain. 25 tablet 2  . Omega-3 Fatty Acids (FISH OIL) 1000 MG CAPS Take 1,000 mg by mouth daily.     . potassium chloride SA (KLOR-CON M20) 20 MEQ tablet Take 1 tablet (20 mEq total) by mouth daily. 90 tablet 3  . pravastatin (PRAVACHOL) 80 MG tablet Take 1 tablet (80 mg total) by mouth every evening. 90 tablet 3  .  spironolactone (ALDACTONE) 25 MG tablet Take 12.5 mg by mouth 2 (two) times daily.     No current facility-administered medications for this visit.     Past Surgical History  Procedure Laterality Date  . Cardiac catheterization  April 2014    Med Rx  . S/p hysterectomy    . Left and right heart catheterization with coronary angiogram N/A 07/17/2014    Procedure: LEFT AND RIGHT HEART CATHETERIZATION WITH CORONARY ANGIOGRAM;  Surgeon: Micheline Chapman, MD;  Location: [facility] CATH LAB;  Service: Cardiovascular;  Laterality: N/A;  . Percutaneous coronary stent intervention (pci-s)  07/17/2014    Procedure: PERCUTANEOUS CORONARY STENT INTERVENTION (PCI-S);  Surgeon:  Micheline Chapman, MD;  Location: [facility] CATH LAB;  Service: Cardiovascular;;  Distal RCA  . Ep implantable device  04/18/2015    BV ICD  . Ep implantable device N/A 04/18/2015    Procedure: BiV ICD Insertion CRT-D;  Surgeon: Duke Salvia, MD;  Location: [facility] INVASIVE CV LAB;  Service: Cardiovascular;  Laterality: N/A;     Allergies  Allergen Reactions  . Lactose Intolerance (Gi) Other (See Comments)    GI Upset   . Sulfa Antibiotics Rash      Family History  Problem Relation Age of Onset  . Colon cancer Neg Hx   . Hypertension Sister   . Diabetes Mellitus II Sister   . Hypertension Brother   . Diabetes Mellitus II Brother   . Hypertension Brother   . Diabetes Mellitus II Brother   . Hypertension Brother   . Diabetes Mellitus II Brother      Social History Nicole Clements reports that she has been smoking Cigarettes.  She started smoking about 53 years ago. She has a 25 pack-year smoking history. She has never used smokeless tobacco. Nicole Clements reports that she does not drink alcohol.   Review of Systems CONSTITUTIONAL: No weight loss, fever, chills, weakness or fatigue.  HEENT: Eyes: No visual loss, blurred vision, double vision or yellow sclerae.No hearing loss, sneezing, congestion, runny nose or sore throat.  SKIN: No rash or itching.  CARDIOVASCULAR: per HPI RESPIRATORY: No shortness of breath, cough or sputum.  GASTROINTESTINAL: No anorexia, nausea, vomiting or diarrhea. No abdominal pain or blood.  GENITOURINARY: No burning on urination, no polyuria NEUROLOGICAL: No headache, dizziness, syncope, paralysis, ataxia, numbness or tingling in the extremities. No change in bowel or bladder control.  MUSCULOSKELETAL: No muscle, back pain, joint pain or stiffness.  LYMPHATICS: No enlarged nodes. No history of splenectomy.  PSYCHIATRIC: No history of depression or anxiety.  ENDOCRINOLOGIC: No reports of sweating, cold or heat intolerance. No polyuria or polydipsia.   [name]   Physical Examination Filed Vitals:   05/08/15 1017  BP: 110/64  Pulse: 71   Filed Vitals:   05/08/15 1017  Height: 5\' 3"  (1.6 m)  Weight: 161 lb (73.029 kg)    Gen: resting comfortably, no acute distress HEENT: no scleral icterus, pupils equal round and reactive, no palptable cervical adenopathy,  CV: RRR, no m/r/g, no JVD Resp: Clear to auscultation bilaterally GI: abdomen is soft, non-tender, non-distended, normal bowel sounds, no hepatosplenomegaly MSK: extremities are warm, no edema.  Skin: warm, no rash Neuro:  no focal deficits Psych: appropriate affect   Diagnostic Studies 02/2013 Cath  Procedural Findings:  Hemodynamics:  AO 116/45 with a mean of 70  LV 120/18  Coronary angiography:  Coronary dominance: right  Left mainstem: The left main is patent with 30% distal left main stenosis as the  vessel trifurcates into the LAD, intermediate Blaire Palomino, and left circumflex.  Left anterior descending (LAD): The LAD is patent with diffuse disease noted. There is minimal calcification present. The first diagonal is patent with 30-50% diffuse disease. The LAD after the first septal perforator has 40-50% stenosis. The vessel reaches the apex and wraps around the left ventricular apex without significant stenosis.  Left circumflex (LCx): The intermediate Orphia Mctigue is small in caliber and diffusely diseased with 50-60% proximal vessel stenosis the AV groove circumflex is diffusely diseased with 50% ostial stenosis and 30% mid stenosis it supplies 2 small posterolateral Barbee Mamula  Right coronary artery (RCA): The RCA is diffusely diseased. There is significant tortuosity, especially around the junction of the mid and distal vessel. The vessel is diffusely calcified. The mid vessel has tandem 50% stenoses. The distal vessel has 80-90 % stenosis just before the bifurcation of the PDA and posterior AV segment. The PDA and posterior AV segment with 3 posterolateral branches are patent.   Left ventriculography: Left ventricular systolic function is moderately depressed. There is severe hypokinesis of the basal and midinferior walls. The anterolateral and apical walls contract normally. The left ventricular ejection fraction is estimated at 45%.  Abdominal aortogram: The abdominal aorta is patent. The renal arteries are single bilaterally and they are widely patent. There appears to be nonobstructive stenosis of the right common iliac artery at the aortoiliac junction. The left common iliac artery appears to have significant ostial stenosis estimated at at least 75% angiographically. The visualized portions of the external iliac and common femoral arteries are patent  Final Conclusions:  1. Severe single-vessel coronary artery disease involving the distal right coronary artery  2. Moderate LV dysfunction with segmental severe hypokinesis of the inferior wall and normal contraction of the antero-apex with estimated left ventricular ejection fraction of 45%.  3. Severe left common iliac artery stenosis  Recommendations: The patient has severe distal RCA disease. She has a known history of inferior wall infarction and remote PCA. This fits with that clinical history. She has nonobstructive disease of the left coronary artery. Her left ventricular ejection fraction by ventriculography is clearly greater than 35%. With her history of previous MI, I do not think PCI is indicated with an absence of angina. Will carefully discussed symptoms with the patient, but I am inclined to treat her medically. Will also review indication for consideration of peripheral intervention for her iliac disease.    01/2013 Echo  Study Conclusions  - Left ventricle: The cavity size was mildly to moderately dilated. Wall thickness was normal. Systolic function was severely reduced. The estimated ejection fraction was 25%. Severe diffuse hypokinesis. - Aortic valve: Mildly calcified annulus. Trileaflet. -  Mitral valve: Calcified annulus. - Left atrium: The atrium was moderately dilated. - Right ventricle: The cavity size was normal. Wall thickness was mildly to moderately increased. - Right atrium: The atrium was mildly dilated. - Atrial septum: No defect or patent foramen ovale was identified. Impressions:  - Compared to the prior study of 03/13/11, there has been substantial deterioration in LV systolic function.   07/2014 Echo Study Conclusions  - Left ventricle: The cavity size was severely dilated. Wall thickness was normal. The estimated ejection fraction was 20%. Diffuse hypokinesis. There is akinesis of the inferolateral and inferior myocardium. Features are consistent with a pseudonormal left ventricular filling pattern, with concomitant abnormal relaxation and increased filling pressure (grade 2 diastolic dysfunction). Doppler parameters are consistent with high ventricular filling pressure. - Mitral valve: There was  mild to moderate regurgitation. - Left atrium: The atrium was moderately dilated.  Impressions:  - Global hypokinesis; inferior and inferolateral akinesis; overall severely reduced LV function; grade 2 diastolic dysfunction with elevated left ventricular filling pressures, moderate LAE; mild to moderate MR.  07/2014 Cath PROCEDURAL FINDINGS  Hemodynamics:  AO 122/48  LV 118/14  RA 9  RV 41/11  PA 37/5 with a mean of 21  PCWP 27  Oxygen saturations  AO 90  PA 56  Cardiac output 3.5  Cardiac index 2.0  Coronary angiography:  Coronary dominance: right  Left mainstem: The left mainstem is patent with 30% distal left main stenosis.  Left anterior descending (LAD): The LAD is diffusely diseased in the midportion. There are sequential 50% stenoses, unchanged from previous study. The first diagonal Johny Pitstick is large in distribution with diffuse nonobstructive disease noted. The mid and distal LAD are patent without significant disease.   Left circumflex (LCx): The left circumflex has 50% ostial stenosis, 50% mid vessel stenosis, and to OM branches with no significant disease. There is an intermediate Nashaly Dorantes is diffusely diseased with up to 70% stenosis. This vessel is small in caliber.  Right coronary artery (RCA): Dominant vessel. Tortuous with diffuse nonobstructive stenosis throughout the proximal and mid portions. There are diffuse 50% stenosis present. The distal vessel has an ulcerated-appearing irregular 90% stenosis involving the bifurcation of the PDA and posterolateral branches. The posterolateral branches are patent with mild diffuse disease. The PDA arises an acute angulation with 30-40% stenosis. The mid body of the PDA has an 80-90% stenosis in an area where the vessel is too small for PCI.  Left ventriculography: The ventricle appears the synchronous. The basal and midinferior wall are akinetic. The basal inferolateral wall is severely hypokinetic. The anterolateral wall and apex contract normally. The estimated LVEF is 30%.  PCI Procedure Note: Following the diagnostic procedure, the decision was made to proceed with PCI of the distal RCA. The lesion is complex, there is a large amount of myocardium involved. I felt there was a risk of potentially compromising the PDA Laure Leone, but overall in my opinion the risk/benefit was favorable for PCI considering the severe stenosis involving the entire RCA distribution. The sheath was upsized to a 6 [place]. Weight-based bivalirudin was given for anticoagulation. The patient was loaded with Plavix 600 mg on the table. Once a therapeutic ACT was achieved, a 6 [place] JR 4 guide catheter was inserted. A cougar coronary guidewire was used to cross the lesion into the PLA Glorine Hanratty. The lesion was predilated with a 2.5 x 15 mm balloon. The lesion was then stented with a 3.0 by 16mm Promus DES stent. The stent was postdilated with a 3.25 mm noncompliant balloon to 18 atmospheres. The PDA was  stented across as the stent extended from the distal RCA proximal PLA Refugio Vandevoorde. The PDA was totally occluded. I made a long attempt with a whisper wire using multiple bands on the tip of the wire, but all of these attempts were unsuccessful at rewiring the PDA. The patient was completely asymptomatic and specifically denied any chest pain. Her hemodynamics remained stable and there were no changes in her rhythm or blood pressure. A final image of the left coronary artery was obtained and this demonstrated a collateral from the LAD to the PDA Dyon Rotert. Following PCI, there was 0% residual stenosis and TIMI-3 flow. Final angiography confirmed an excellent result. Femoral hemostasis was achieved will be achieved with manual hemostasis. The patient tolerated the PCI procedure  well. There were no immediate procedural complications. The patient was transferred to the post catheterization recovery area for further monitoring.  PCI Data:  Vessel - RCA/Segment - distal  Percent Stenosis (pre) 90  TIMI-flow 3  Stent 3.0 by 79mm Promus DES  Percent Stenosis (post) 0  TIMI-flow (post) 3  Contrast: 175 cc Omnipaque  Radiation dose/Fluoro time: 15.5 minutes  Estimated Blood Loss: Minimal  Final Conclusions:  1. Nonobstructive disease involving the LAD, left circumflex, and OM/diagonal side branches  2. Severe stenosis of the distal RCA bifurcation, treated successfully with a drug-eluting stent into the PLA Myrle Dues with residual occlusion of the PDA Stefana Lodico  3. Severe segmental LV systolic dysfunction  Recommendations: Dual antiplatelet therapy with aspirin and Plavix for at least 12 months. Will cycle cardiac enzymes since the PDA Jesiah Yerby is occluded after stenting.  10/2014 Echo Study Conclusions  - Procedure narrative: Transthoracic echocardiography. Image quality was suboptimal. The study was technically difficult, as a result of poor sound wave transmission and body habitus. - Left  ventricle: Systolic function is severely reduced, estimated EF 15-20%. The cavity size was severely dilated. Doppler parameters are consistent with abnormal left ventricular relaxation (grade 1 diastolic dysfunction). Doppler parameters are consistent with high ventricular filling pressure. Medial E/e&' 24. - Regional wall motion abnormality: Akinesis of the basal-mid inferior and basal inferolateral myocardium; severe hypokinesis of the apical inferior and mid inferolateral myocardium. Severe, diffuse hypokinesis. - Ventricular septum: Septal motion showed abnormal function and dyssynergy. These changes are consistent with intraventricular conduction delay. - Aortic valve: Mildly calcified annulus. Trileaflet; mildly thickened leaflets. - Mitral valve: Mildly dilated annulus. Mildly thickened leaflets . There was moderate eccentric regurgitation. 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 - Left atrium: The atrium was severely dilated. Volume/bsa, S: 47.1 ml/m^2. - Right ventricle: Systolic function was mildly reduced. - Right atrium: The atrium was mildly dilated. - Atrial septum: There was increased thickness of the septum, consistent with lipomatous hypertrophy. - Tricuspid valve: There was mild regurgitation. - Pulmonary arteries: PA peak pressure: 42 mm Hg (S). Mildly elevated pulmonary pressures. - Pericardium, extracardiac: There was a small pericardial effusion, with no evidence of tamponade physiology.  04/2015 Carotid US IMPRESSION: 1. Mild (1-49%) stenosis of the proximal right internal carotid artery secondary to smooth heterogeneous atherosclerotic plaque. No interval change compared to recent prior imaging from earlier this month. 2. Mild (1-49%) stenosis of the proximal left internal carotid artery secondary to heterogeneous and irregular atherosclerotic plaque. No interval change compared to recent prior imaging  from earlier this month. 3. Vertebral arteries remain patent with normal antegrade flow. Signed,  Assessment and Plan  1. CAD - no current symptoms, continue DAPT at least until 07/2015 - continue risk factor modification and secondary prevention  2. Chronic systolic HF - echo 04/2015 LVEF 20-25% , NYHA II, she recently had CRT-D device placed by Dr Graciela Husbands - likely repeat echo in several months on medical therapy and with CRT-D - will increase coreg to 25mg  bid now that bradycardia concern resolved with CRT  3. Mitral regurgitation Mild to moderate by last echo, continue to follow clinically.  4. Hyperlipidemia - options limited by cost, continue pravastatin 80mg  daily  5. CVA - continue to follow with neuro - no evidence of cardioembolic source at this time.    F/u 1 month   Antoine Poche, M.D.

## 2015-05-08 NOTE — Patient Instructions (Signed)
Your physician recommends that you schedule a follow-up appointment in: 1 MONTH WITH DR. BRANCH   INCREASE YOUR COREG TO 25 MG TWO TIMES DAILY   Thanks for choosing [facility]!!!

## 2015-05-10 DIAGNOSIS — I5022 Chronic systolic (congestive) heart failure: Secondary | ICD-10-CM | POA: Diagnosis not present

## 2015-05-10 DIAGNOSIS — E785 Hyperlipidemia, unspecified: Secondary | ICD-10-CM | POA: Diagnosis not present

## 2015-05-10 DIAGNOSIS — I2510 Atherosclerotic heart disease of native coronary artery without angina pectoris: Secondary | ICD-10-CM | POA: Diagnosis not present

## 2015-05-10 DIAGNOSIS — I479 Paroxysmal tachycardia, unspecified: Secondary | ICD-10-CM | POA: Diagnosis not present

## 2015-05-10 DIAGNOSIS — I69320 Aphasia following cerebral infarction: Secondary | ICD-10-CM | POA: Diagnosis not present

## 2015-05-10 DIAGNOSIS — I255 Ischemic cardiomyopathy: Secondary | ICD-10-CM | POA: Diagnosis not present

## 2015-05-14 DIAGNOSIS — I255 Ischemic cardiomyopathy: Secondary | ICD-10-CM | POA: Diagnosis not present

## 2015-05-14 DIAGNOSIS — I5022 Chronic systolic (congestive) heart failure: Secondary | ICD-10-CM | POA: Diagnosis not present

## 2015-05-14 DIAGNOSIS — I479 Paroxysmal tachycardia, unspecified: Secondary | ICD-10-CM | POA: Diagnosis not present

## 2015-05-14 DIAGNOSIS — I2510 Atherosclerotic heart disease of native coronary artery without angina pectoris: Secondary | ICD-10-CM | POA: Diagnosis not present

## 2015-05-14 DIAGNOSIS — E785 Hyperlipidemia, unspecified: Secondary | ICD-10-CM | POA: Diagnosis not present

## 2015-05-14 DIAGNOSIS — I69320 Aphasia following cerebral infarction: Secondary | ICD-10-CM | POA: Diagnosis not present

## 2015-05-17 DIAGNOSIS — Z950 Presence of cardiac pacemaker: Secondary | ICD-10-CM | POA: Diagnosis not present

## 2015-05-17 DIAGNOSIS — E782 Mixed hyperlipidemia: Secondary | ICD-10-CM | POA: Diagnosis not present

## 2015-05-17 DIAGNOSIS — I129 Hypertensive chronic kidney disease with stage 1 through stage 4 chronic kidney disease, or unspecified chronic kidney disease: Secondary | ICD-10-CM | POA: Diagnosis not present

## 2015-05-17 DIAGNOSIS — Z720 Tobacco use: Secondary | ICD-10-CM | POA: Diagnosis not present

## 2015-05-17 DIAGNOSIS — Z8673 Personal history of transient ischemic attack (TIA), and cerebral infarction without residual deficits: Secondary | ICD-10-CM | POA: Diagnosis not present

## 2015-05-17 DIAGNOSIS — I25119 Atherosclerotic heart disease of native coronary artery with unspecified angina pectoris: Secondary | ICD-10-CM | POA: Diagnosis not present

## 2015-05-17 DIAGNOSIS — Z0000 Encounter for general adult medical examination without abnormal findings: Secondary | ICD-10-CM | POA: Diagnosis not present

## 2015-05-17 DIAGNOSIS — I5022 Chronic systolic (congestive) heart failure: Secondary | ICD-10-CM | POA: Diagnosis not present

## 2015-05-17 DIAGNOSIS — N183 Chronic kidney disease, stage 3 (moderate): Secondary | ICD-10-CM | POA: Diagnosis not present

## 2015-05-30 DIAGNOSIS — I6990 Unspecified sequelae of unspecified cerebrovascular disease: Secondary | ICD-10-CM | POA: Diagnosis not present

## 2015-05-30 DIAGNOSIS — E785 Hyperlipidemia, unspecified: Secondary | ICD-10-CM | POA: Diagnosis not present

## 2015-05-30 DIAGNOSIS — I10 Essential (primary) hypertension: Secondary | ICD-10-CM | POA: Diagnosis not present

## 2015-05-30 DIAGNOSIS — I2510 Atherosclerotic heart disease of native coronary artery without angina pectoris: Secondary | ICD-10-CM | POA: Diagnosis not present

## 2015-06-05 ENCOUNTER — Ambulatory Visit (INDEPENDENT_AMBULATORY_CARE_PROVIDER_SITE_OTHER): Payer: Medicare Other | Admitting: Cardiology

## 2015-06-05 ENCOUNTER — Encounter: Payer: Self-pay | Admitting: Cardiology

## 2015-06-05 VITALS — BP 96/58 | HR 58 | Ht 63.0 in | Wt 157.0 lb

## 2015-06-05 DIAGNOSIS — I5022 Chronic systolic (congestive) heart failure: Secondary | ICD-10-CM

## 2015-06-05 DIAGNOSIS — I255 Ischemic cardiomyopathy: Secondary | ICD-10-CM | POA: Diagnosis not present

## 2015-06-05 DIAGNOSIS — E785 Hyperlipidemia, unspecified: Secondary | ICD-10-CM

## 2015-06-05 DIAGNOSIS — I2510 Atherosclerotic heart disease of native coronary artery without angina pectoris: Secondary | ICD-10-CM

## 2015-06-05 NOTE — Progress Notes (Addendum)
Patient ID: Nicole Clements, female   DOB: 08-07-1941, 74 y.o.   MRN: [id number]     Clinical Summary Ms. Adinolfi is a 74 y.o.female seen today for follow up of the following medical problems.   1. 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 Recent admit 07/2014 with CHF and troponin elevation - cath 07/2014, LM 30% distal, LAD mid 50%, LCX ostial 50% and mid 50%, small intermediate Cletus Paris 70%. RCA diffuse 50%, distal RCA with ulcerated 90% lesion, PDA 80-90% too small for PCI. LVEF 30%. Received DES to distal RCA, post procedure the PDA became occluded.  - echo 07/2014 LVEF 20%, inferior akinesis, grade II diastolic dysfunction. Repeat echo 10/2014 LVEF remains 15-20%.  - 04/2015 CRT-D device placed by Dr Graciela Husbands. Missed wound care appointment because was admitted.   - no chest pain, no SOB. No LE edema, no orthopnea - limiting sodium intake. Avoiding NSAIDs. - check weights, typically 158-159 lbs. - compliant with meds. Tolerating well without side effects.    2. Mitral regurgitation - mild to moderate by echo - denies any significant SOB or LE edema  3. Hyperlipidemia - atorvastatin too expensive, she has not been able to get at home.  - compliant with pravastatin 80mg  daily  4. CVA - admit late 04/2015 with CVA - recs by neurology to continue DAPT - ICD evaluated, no evidence of afib or aflutter   Past Medical History  Diagnosis Date  . STEMI (ST elevation myocardial infarction) 07/22/1996  . CAD (coronary artery disease) April 2014    DES to PLA, occluded PDA 07/2014  . Hypertension   . Hypercholesterolemia   . S/P colonoscopy August 2007    Hyperplastic polyps, rare sigmoid and descending colon diverticulosis, small internal hemorrhoids  . CHF (congestive heart failure)   . Ischemic cardiomyopathy     LVEF 25%-45%  . PVC's (premature ventricular contractions)   . IVCD (intraventricular conduction defect)      Allergies  Allergen Reactions  . Lactose  Intolerance (Gi) Other (See Comments)    GI Upset   . Sulfa Antibiotics Rash     Current Outpatient Prescriptions  Medication Sig Dispense Refill  . acetaminophen (TYLENOL) 325 MG tablet Take 2 tablets (650 mg total) by mouth every 4 (four) hours as needed for headache or mild pain.    [name] aspirin EC 81 MG EC tablet Take 1 tablet (81 mg total) by mouth daily.    . carvedilol (COREG) 25 MG tablet Take 1 tablet (25 mg total) by mouth 2 (two) times daily. 60 tablet 6  . Cholecalciferol (VITAMIN D3) 2000 UNITS capsule Take 2,000 Units by mouth daily.      . clopidogrel (PLAVIX) 75 MG tablet Take 1 tablet (75 mg total) by mouth daily with breakfast. 30 tablet 11  . furosemide (LASIX) 80 MG tablet Take 1 tablet (80 mg total) by mouth daily. 30 tablet 11  . losartan (COZAAR) 25 MG tablet Take 1 tablet (25 mg total) by mouth daily. 90 tablet 3  . nitroGLYCERIN (NITROSTAT) 0.4 MG SL tablet Place 1 tablet (0.4 mg total) under the tongue every 5 (five) minutes x 3 doses as needed for chest pain. 25 tablet 2  . Omega-3 Fatty Acids (FISH OIL) 1000 MG CAPS Take 1,000 mg by mouth daily.     . potassium chloride SA (KLOR-CON M20) 20 MEQ tablet Take 1 tablet (20 mEq total) by mouth daily. 90 tablet 3  . pravastatin (PRAVACHOL) 80 MG tablet Take  1 tablet (80 mg total) by mouth every evening. 90 tablet 3  . spironolactone (ALDACTONE) 25 MG tablet Take 12.5 mg by mouth 2 (two) times daily.     No current facility-administered medications for this visit.     Past Surgical History  Procedure Laterality Date  . Cardiac catheterization  April 2014    Med Rx  . S/p hysterectomy    . Left and right heart catheterization with coronary angiogram N/A 07/17/2014    Procedure: LEFT AND RIGHT HEART CATHETERIZATION WITH CORONARY ANGIOGRAM;  Surgeon: Micheline Chapman, MD;  Location: [facility] CATH LAB;  Service: Cardiovascular;  Laterality: N/A;  . Percutaneous coronary stent intervention (pci-s)  07/17/2014    Procedure:  PERCUTANEOUS CORONARY STENT INTERVENTION (PCI-S);  Surgeon: Micheline Chapman, MD;  Location: [facility] CATH LAB;  Service: Cardiovascular;;  Distal RCA  . Ep implantable device  04/18/2015    BV ICD  . Ep implantable device N/A 04/18/2015    Procedure: BiV ICD Insertion CRT-D;  Surgeon: Duke Salvia, MD;  Location: [facility] INVASIVE CV LAB;  Service: Cardiovascular;  Laterality: N/A;     Allergies  Allergen Reactions  . Lactose Intolerance (Gi) Other (See Comments)    GI Upset   . Sulfa Antibiotics Rash      Family History  Problem Relation Age of Onset  . Colon cancer Neg Hx   . Hypertension Sister   . Diabetes Mellitus II Sister   . Hypertension Brother   . Diabetes Mellitus II Brother   . Hypertension Brother   . Diabetes Mellitus II Brother   . Hypertension Brother   . Diabetes Mellitus II Brother      Social History Ms. Greb reports that she has been smoking Cigarettes.  She started smoking about 53 years ago. She has a 25 pack-year smoking history. She has never used smokeless tobacco. Ms. Pates reports that she does not drink alcohol.   Review of Systems CONSTITUTIONAL: No weight loss, fever, chills, weakness or fatigue.  HEENT: Eyes: No visual loss, blurred vision, double vision or yellow sclerae.No hearing loss, sneezing, congestion, runny nose or sore throat.  SKIN: No rash or itching.  CARDIOVASCULAR: per HPI RESPIRATORY: No shortness of breath, cough or sputum.  GASTROINTESTINAL: No anorexia, nausea, vomiting or diarrhea. No abdominal pain or blood.  GENITOURINARY: No burning on urination, no polyuria NEUROLOGICAL: No headache, dizziness, syncope, paralysis, ataxia, numbness or tingling in the extremities. No change in bowel or bladder control.  MUSCULOSKELETAL: No muscle, back pain, joint pain or stiffness.  LYMPHATICS: No enlarged nodes. No history of splenectomy.  PSYCHIATRIC: No history of depression or anxiety.  ENDOCRINOLOGIC: No reports of sweating, cold or  heat intolerance. No polyuria or polydipsia.  [name]   Physical Examination Filed Vitals:   06/05/15 1111  BP: 96/58  Pulse: 58   Filed Vitals:   06/05/15 1111  Height:  (1.6 m)  Weight: 157 lb (71.215 kg)    Gen: resting comfortably, no acute distress HEENT: no scleral icterus, pupils equal round and reactive, no palptable cervical adenopathy,  CV: RRR, no m/r/g, no JVD Resp: Clear to auscultation bilaterally GI: abdomen is soft, non-tender, non-distended, normal bowel sounds, no hepatosplenomegaly MSK: extremities are warm, no edema.  Skin: warm, no rash Neuro:  no focal deficits Psych: appropriate affect   Diagnostic Studies 02/2013 Cath  Procedural Findings:  Hemodynamics:  AO 116/45 with a mean of 70  LV 120/18  Coronary angiography:  Coronary dominance: right  Left  mainstem: The left main is patent with 30% distal left main stenosis as the vessel trifurcates into the LAD, intermediate Velton Roselle, and left circumflex.  Left anterior descending (LAD): The LAD is patent with diffuse disease noted. There is minimal calcification present. The first diagonal is patent with 30-50% diffuse disease. The LAD after the first septal perforator has 40-50% stenosis. The vessel reaches the apex and wraps around the left ventricular apex without significant stenosis.  Left circumflex (LCx): The intermediate Jaedynn Bohlken is small in caliber and diffusely diseased with 50-60% proximal vessel stenosis the AV groove circumflex is diffusely diseased with 50% ostial stenosis and 30% mid stenosis it supplies 2 small posterolateral Tera Pellicane  Right coronary artery (RCA): The RCA is diffusely diseased. There is significant tortuosity, especially around the junction of the mid and distal vessel. The vessel is diffusely calcified. The mid vessel has tandem 50% stenoses. The distal vessel has 80-90 % stenosis just before the bifurcation of the PDA and posterior AV segment. The PDA and posterior AV  segment with 3 posterolateral branches are patent.  Left ventriculography: Left ventricular systolic function is moderately depressed. There is severe hypokinesis of the basal and midinferior walls. The anterolateral and apical walls contract normally. The left ventricular ejection fraction is estimated at 45%.  Abdominal aortogram: The abdominal aorta is patent. The renal arteries are single bilaterally and they are widely patent. There appears to be nonobstructive stenosis of the right common iliac artery at the aortoiliac junction. The left common iliac artery appears to have significant ostial stenosis estimated at at least 75% angiographically. The visualized portions of the external iliac and common femoral arteries are patent  Final Conclusions:  1. Severe single-vessel coronary artery disease involving the distal right coronary artery  2. Moderate LV dysfunction with segmental severe hypokinesis of the inferior wall and normal contraction of the antero-apex with estimated left ventricular ejection fraction of 45%.  3. Severe left common iliac artery stenosis  Recommendations: The patient has severe distal RCA disease. She has a known history of inferior wall infarction and remote PCA. This fits with that clinical history. She has nonobstructive disease of the left coronary artery. Her left ventricular ejection fraction by ventriculography is clearly greater than 35%. With her history of previous MI, I do not think PCI is indicated with an absence of angina. Will carefully discussed symptoms with the patient, but I am inclined to treat her medically. Will also review indication for consideration of peripheral intervention for her iliac disease.    01/2013 Echo  Study Conclusions  - Left ventricle: The cavity size was mildly to moderately dilated. Wall thickness was normal. Systolic function was severely reduced. The estimated ejection fraction was 25%. Severe diffuse hypokinesis. -  Aortic valve: Mildly calcified annulus. Trileaflet. - Mitral valve: Calcified annulus. - Left atrium: The atrium was moderately dilated. - Right ventricle: The cavity size was normal. Wall thickness was mildly to moderately increased. - Right atrium: The atrium was mildly dilated. - Atrial septum: No defect or patent foramen ovale was identified. Impressions:  - Compared to the prior study of 03/13/11, there has been substantial deterioration in LV systolic function.   07/2014 Echo Study Conclusions  - Left ventricle: The cavity size was severely dilated. Wall thickness was normal. The estimated ejection fraction was 20%. Diffuse hypokinesis. There is akinesis of the inferolateral and inferior myocardium. Features are consistent with a pseudonormal left ventricular filling pattern, with concomitant abnormal relaxation and increased filling pressure (grade 2 diastolic dysfunction).  Doppler parameters are consistent with high ventricular filling pressure. - Mitral valve: There was mild to moderate regurgitation. - Left atrium: The atrium was moderately dilated.  Impressions:  - Global hypokinesis; inferior and inferolateral akinesis; overall severely reduced LV function; grade 2 diastolic dysfunction with elevated left ventricular filling pressures, moderate LAE; mild to moderate MR.  07/2014 Cath PROCEDURAL FINDINGS  Hemodynamics:  AO 122/48  LV 118/14  RA 9  RV 41/11  PA 37/5 with a mean of 21  PCWP 27  Oxygen saturations  AO 90  PA 56  Cardiac output 3.5  Cardiac index 2.0  Coronary angiography:  Coronary dominance: right  Left mainstem: The left mainstem is patent with 30% distal left main stenosis.  Left anterior descending (LAD): The LAD is diffusely diseased in the midportion. There are sequential 50% stenoses, unchanged from previous study. The first diagonal Ollivander See is large in distribution with diffuse nonobstructive disease noted. The mid and  distal LAD are patent without significant disease.  Left circumflex (LCx): The left circumflex has 50% ostial stenosis, 50% mid vessel stenosis, and to OM branches with no significant disease. There is an intermediate Pritika Alvarez is diffusely diseased with up to 70% stenosis. This vessel is small in caliber.  Right coronary artery (RCA): Dominant vessel. Tortuous with diffuse nonobstructive stenosis throughout the proximal and mid portions. There are diffuse 50% stenosis present. The distal vessel has an ulcerated-appearing irregular 90% stenosis involving the bifurcation of the PDA and posterolateral branches. The posterolateral branches are patent with mild diffuse disease. The PDA arises an acute angulation with 30-40% stenosis. The mid body of the PDA has an 80-90% stenosis in an area where the vessel is too small for PCI.  Left ventriculography: The ventricle appears the synchronous. The basal and midinferior wall are akinetic. The basal inferolateral wall is severely hypokinetic. The anterolateral wall and apex contract normally. The estimated LVEF is 30%.  PCI Procedure Note: Following the diagnostic procedure, the decision was made to proceed with PCI of the distal RCA. The lesion is complex, there is a large amount of myocardium involved. I felt there was a risk of potentially compromising the PDA Tona Qualley, but overall in my opinion the risk/benefit was favorable for PCI considering the severe stenosis involving the entire RCA distribution. The sheath was upsized to a 6 [place]. Weight-based bivalirudin was given for anticoagulation. The patient was loaded with Plavix 600 mg on the table. Once a therapeutic ACT was achieved, a 6 [place] JR 4 guide catheter was inserted. A cougar coronary guidewire was used to cross the lesion into the PLA Relena Ivancic. The lesion was predilated with a 2.5 x 15 mm balloon. The lesion was then stented with a 3.0 by 16mm Promus DES stent. The stent was postdilated with a 3.25 mm  noncompliant balloon to 18 atmospheres. The PDA was stented across as the stent extended from the distal RCA proximal PLA Demarrion Meiklejohn. The PDA was totally occluded. I made a long attempt with a whisper wire using multiple bands on the tip of the wire, but all of these attempts were unsuccessful at rewiring the PDA. The patient was completely asymptomatic and specifically denied any chest pain. Her hemodynamics remained stable and there were no changes in her rhythm or blood pressure. A final image of the left coronary artery was obtained and this demonstrated a collateral from the LAD to the PDA Krisann Mckenna. Following PCI, there was 0% residual stenosis and TIMI-3 flow. Final angiography confirmed an excellent result. Femoral hemostasis  was achieved will be achieved with manual hemostasis. The patient tolerated the PCI procedure well. There were no immediate procedural complications. The patient was transferred to the post catheterization recovery area for further monitoring.  PCI Data:  Vessel - RCA/Segment - distal  Percent Stenosis (pre) 90  TIMI-flow 3  Stent 3.0 by 16mm Promus DES  Percent Stenosis (post) 0  TIMI-flow (post) 3  Contrast: 175 cc Omnipaque  Radiation dose/Fluoro time: 15.5 minutes  Estimated Blood Loss: Minimal  Final Conclusions:  1. Nonobstructive disease involving the LAD, left circumflex, and OM/diagonal side branches  2. Severe stenosis of the distal RCA bifurcation, treated successfully with a drug-eluting stent into the PLA Meleena Munroe with residual occlusion of the PDA Zanna Hawn  3. Severe segmental LV systolic dysfunction  Recommendations: Dual antiplatelet therapy with aspirin and Plavix for at least 12 months. Will cycle cardiac enzymes since the PDA Velia Pamer is occluded after stenting.  10/2014 Echo Study Conclusions  - Procedure narrative: Transthoracic echocardiography. Image quality was suboptimal. The study was technically difficult, as a result of poor sound  wave transmission and body habitus. - Left ventricle: Systolic function is severely reduced, estimated EF 15-20%. The cavity size was severely dilated. Doppler parameters are consistent with abnormal left ventricular relaxation (grade 1 diastolic dysfunction). Doppler parameters are consistent with high ventricular filling pressure. Medial E/e&' 24. - Regional wall motion abnormality: Akinesis of the basal-mid inferior and basal inferolateral myocardium; severe hypokinesis of the apical inferior and mid inferolateral myocardium. Severe, diffuse hypokinesis. - Ventricular septum: Septal motion showed abnormal function and dyssynergy. These changes are consistent with intraventricular conduction delay. - Aortic valve: Mildly calcified annulus. Trileaflet; mildly thickened leaflets. - Mitral valve: Mildly dilated annulus. Mildly thickened leaflets . There was moderate eccentric regurgitation. 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 - Left atrium: The atrium was severely dilated. Volume/bsa, S: 47.1 ml/m^2. - Right ventricle: Systolic function was mildly reduced. - Right atrium: The atrium was mildly dilated. - Atrial septum: There was increased thickness of the septum, consistent with lipomatous hypertrophy. - Tricuspid valve: There was mild regurgitation. - Pulmonary arteries: PA peak pressure: 42 mm Hg (S). Mildly elevated pulmonary pressures. - Pericardium, extracardiac: There was a small pericardial effusion, with no evidence of tamponade physiology.  04/2015 Carotid US IMPRESSION: 1. Mild (1-49%) stenosis of the proximal right internal carotid artery secondary to smooth heterogeneous atherosclerotic plaque. No interval change compared to recent prior imaging from earlier this month. 2. Mild (1-49%) stenosis of the proximal left internal carotid artery secondary to heterogeneous and irregular atherosclerotic plaque. No interval  change compared to recent prior imaging from earlier this month. 3. Vertebral arteries remain patent with normal antegrade flow. Signed,     Assessment and Plan   1. CAD - no current symptoms, continue DAPT at least until 07/2015. Will touch base with neuro to see if ok to stop plavix 07/2015 given her recent CVA - continue risk factor modification and secondary prevention  2. Chronic systolic HF - echo 04/2015 LVEF 20-25% , NYHA II, she recently had CRT-D device placed by Dr Graciela Husbands - likely repeat echo in several months on medical therapy and with CRT-D - soft bp today, no further med titration - she missed her device follow up due to being admitted to hospital, will message Dr Graciela Husbands where and when to schedule her  3. Mitral regurgitation Mild to moderate by last echo, continue to follow clinically.  4. Hyperlipidemia - options limited by cost, continue pravastatin 80mg  daily  5. CVA - continue to follow with neuro    F/u 1 month   Antoine Poche, M.D.   06/18/15 Addendum Heard back from Dr Janann Colonel, he is ok stopping DAPT in September and recommends either ASA 325mg  or plavix alone.   Dominga Ferry MD

## 2015-06-05 NOTE — Patient Instructions (Signed)
Your physician recommends that you schedule a follow-up appointment in: LATE September 2016 WITH Dr. Wyline Mood  Your physician recommends that you continue on your current medications as directed. Please refer to the Current Medication list given to you today.  Thanks for choosing [facility]!!!

## 2015-06-26 DIAGNOSIS — Z780 Asymptomatic menopausal state: Secondary | ICD-10-CM | POA: Diagnosis not present

## 2015-07-02 ENCOUNTER — Other Ambulatory Visit (HOSPITAL_COMMUNITY): Payer: Self-pay | Admitting: Family Medicine

## 2015-07-02 DIAGNOSIS — Z1231 Encounter for screening mammogram for malignant neoplasm of breast: Secondary | ICD-10-CM

## 2015-07-04 ENCOUNTER — Ambulatory Visit (HOSPITAL_COMMUNITY)
Admission: RE | Admit: 2015-07-04 | Discharge: 2015-07-04 | Disposition: A | Discharge status: Home / Self Care | Payer: Medicare Other | Source: Ambulatory Visit | Attending: Family Medicine | Admitting: Family Medicine

## 2015-07-04 DIAGNOSIS — Z1231 Encounter for screening mammogram for malignant neoplasm of breast: Secondary | ICD-10-CM | POA: Insufficient documentation

## 2015-07-18 ENCOUNTER — Other Ambulatory Visit: Payer: Self-pay | Admitting: *Deleted

## 2015-07-18 ENCOUNTER — Other Ambulatory Visit: Payer: Self-pay | Admitting: Cardiovascular Disease

## 2015-07-18 MED ORDER — CLOPIDOGREL BISULFATE 75 MG PO TABS: 75.0000 mg | ORAL_TABLET | Freq: Every day | ORAL | Status: DC

## 2015-07-23 ENCOUNTER — Ambulatory Visit (INDEPENDENT_AMBULATORY_CARE_PROVIDER_SITE_OTHER): Payer: Medicare Other | Admitting: Internal Medicine

## 2015-07-23 ENCOUNTER — Encounter: Payer: Self-pay | Admitting: Internal Medicine

## 2015-07-23 ENCOUNTER — Other Ambulatory Visit: Payer: Self-pay | Admitting: *Deleted

## 2015-07-23 VITALS — BP 122/66 | HR 73 | Ht 62.5 in | Wt 157.2 lb

## 2015-07-23 DIAGNOSIS — I5022 Chronic systolic (congestive) heart failure: Secondary | ICD-10-CM | POA: Diagnosis not present

## 2015-07-23 DIAGNOSIS — I472 Ventricular tachycardia: Secondary | ICD-10-CM

## 2015-07-23 DIAGNOSIS — I4729 Other ventricular tachycardia: Secondary | ICD-10-CM

## 2015-07-23 DIAGNOSIS — I255 Ischemic cardiomyopathy: Secondary | ICD-10-CM

## 2015-07-23 LAB — CUP PACEART INCLINIC DEVICE CHECK
Brady Statistic AP VP Percent: 94.85 %
Brady Statistic AP VS Percent: 1.93 %
Brady Statistic AS VP Percent: 3.14 %
Brady Statistic AS VS Percent: 0.08 %
Brady Statistic RV Percent Paced: 24.22 %
Date Time Interrogation Session: 20160919134021
HighPow Impedance: 285 Ohm
HighPow Impedance: 66 Ohm
Lead Channel Impedance Value: 475 Ohm
Lead Channel Impedance Value: 703 Ohm
Lead Channel Pacing Threshold Amplitude: 0.5 V
Lead Channel Pacing Threshold Amplitude: 0.75 V
Lead Channel Pacing Threshold Pulse Width: 0.4 ms
Lead Channel Pacing Threshold Pulse Width: 0.4 ms
Lead Channel Sensing Intrinsic Amplitude: 2 mV
Lead Channel Sensing Intrinsic Amplitude: 26.5 mV
Lead Channel Setting Pacing Amplitude: 1.75 V
MDC IDC MSMT BATTERY REMAINING LONGEVITY: 103 mo
MDC IDC MSMT BATTERY VOLTAGE: 3.03 V
MDC IDC MSMT LEADCHNL RA PACING THRESHOLD AMPLITUDE: 0.5 V
MDC IDC MSMT LEADCHNL RA PACING THRESHOLD PULSEWIDTH: 0.4 ms
MDC IDC SET LEADCHNL LV PACING PULSEWIDTH: 0.4 ms
MDC IDC SET LEADCHNL RA PACING AMPLITUDE: 1.5 V
MDC IDC SET LEADCHNL RV PACING AMPLITUDE: 2 V
MDC IDC SET LEADCHNL RV PACING PULSEWIDTH: 0.4 ms
MDC IDC SET LEADCHNL RV SENSING SENSITIVITY: 0.45 mV
MDC IDC STAT BRADY RA PERCENT PACED: 96.78 %
Zone Setting Detection Interval: 300 ms
Zone Setting Detection Interval: 340 ms
Zone Setting Detection Interval: 360 ms
Zone Setting Detection Interval: 400 ms

## 2015-07-23 MED ORDER — FUROSEMIDE 80 MG PO TABS: 80.0000 mg | ORAL_TABLET | Freq: Every day | ORAL | Status: DC

## 2015-07-23 NOTE — Progress Notes (Signed)
Patient Care Team: Lupita Raider, MD as PCP - General (Family Medicine) Gretta Cool, MD (Obstetrics and Gynecology) Darden Amber, OD as Referring Physician (Optometry) West Bali, MD (Gastroenterology) Gaylord Shih, MD as Consulting Physician (Cardiology)   HPI  Nicole Clements is a 74 y.o. female Seen in follow-up for CRT ICD implanted for primary prevention 6/16 left ventricular dysfunction. She has a history of ischemic heart disease with prior stenting of the RCA and RPDA.  Ejection fraction most recently 12/15 was 15-20%. Catheterization 9/15 demonstrated no obstructive disease and LV EF of about 30%.  Hospital reviewed it demonstrated hospitalization 2 week after CRT-D implantation with an acute stroke.  She has history of frequent ventricular ectopy. Prior to proceeding with device implantation during evaluation in February, we undertook a Holter monitor which demonstrated 10% PVCs and we began  Antiarrhythmic trial with amiodarone with a significant reduction in PVCs--3%. She had a subsequent echo 3/16 demonstrating no interval improvement.   y She denies exercise intolerance. She has no nocturnal dyspnea. She has no peripheral edema. She's had no syncope.  Past Medical History  Diagnosis Date  . STEMI (ST elevation myocardial infarction) 07/22/1996  . CAD (coronary artery disease) April 2014    DES to PLA, occluded PDA 07/2014  . Hypertension   . Hypercholesterolemia   . S/P colonoscopy August 2007    Hyperplastic polyps, rare sigmoid and descending colon diverticulosis, small internal hemorrhoids  . CHF (congestive heart failure)   . Ischemic cardiomyopathy     LVEF 25%-45%  . PVC's (premature ventricular contractions)   . IVCD (intraventricular conduction defect)     Past Surgical History  Procedure Laterality Date  . Cardiac catheterization  April 2014    Med Rx  . S/p hysterectomy    . Left and right heart catheterization with coronary angiogram  N/A 07/17/2014    Procedure: LEFT AND RIGHT HEART CATHETERIZATION WITH CORONARY ANGIOGRAM;  Surgeon: Micheline Chapman, MD;  Location: [facility] CATH LAB;  Service: Cardiovascular;  Laterality: N/A;  . Percutaneous coronary stent intervention (pci-s)  07/17/2014    Procedure: PERCUTANEOUS CORONARY STENT INTERVENTION (PCI-S);  Surgeon: Micheline Chapman, MD;  Location: [facility] CATH LAB;  Service: Cardiovascular;;  Distal RCA  . Ep implantable device  04/18/2015    BV ICD  . Ep implantable device N/A 04/18/2015    Procedure: BiV ICD Insertion CRT-D;  Surgeon: Duke Salvia, MD;  Location: [facility] INVASIVE CV LAB;  Service: Cardiovascular;  Laterality: N/A;    Current Outpatient Prescriptions  Medication Sig Dispense Refill  . acetaminophen (TYLENOL) 325 MG tablet Take 2 tablets (650 mg total) by mouth every 4 (four) hours as needed for headache or mild pain.    [name] aspirin EC 81 MG EC tablet Take 1 tablet (81 mg total) by mouth daily.    . carvedilol (COREG) 25 MG tablet Take 1 tablet (25 mg total) by mouth 2 (two) times daily. 60 tablet 6  . Cholecalciferol (VITAMIN D3) 2000 UNITS capsule Take 2,000 Units by mouth daily.      . clopidogrel (PLAVIX) 75 MG tablet Take 1 tablet (75 mg total) by mouth daily with breakfast. 30 tablet 11  . furosemide (LASIX) 80 MG tablet Take 1 tablet (80 mg total) by mouth daily. 30 tablet 11  . losartan (COZAAR) 25 MG tablet Take 1 tablet (25 mg total) by mouth daily. 90 tablet 3  . nitroGLYCERIN (NITROSTAT) 0.4 MG SL tablet Place 1  tablet (0.4 mg total) under the tongue every 5 (five) minutes x 3 doses as needed for chest pain. 25 tablet 2  . Omega-3 Fatty Acids (FISH OIL) 1000 MG CAPS Take 1,000 mg by mouth daily.     . potassium chloride SA (KLOR-CON M20) 20 MEQ tablet Take 1 tablet (20 mEq total) by mouth daily. 90 tablet 3  . pravastatin (PRAVACHOL) 80 MG tablet Take 1 tablet (80 mg total) by mouth every evening. 90 tablet 3  . spironolactone (ALDACTONE) 25 MG tablet TAKE 1/2  TABLET TWICE A DAY 30 tablet 0   No current facility-administered medications for this visit.    Allergies  Allergen Reactions  . Lactose Intolerance (Gi) Other (See Comments)    GI Upset   . Sulfa Antibiotics Rash    Review of Systems negative except from HPI and PMH  Physical Exam BP 122/66 mmHg  Pulse 73  Ht 5' 2.5" (1.588 m)  Wt 157 lb 3.2 oz (71.305 kg)  BMI 28.28 kg/m2 Well developed and well nourished in no acute distress HENT normal E scleral and icterus clear Neck Supple JVP flat; carotids brisk and full Clear to ausculation Device pocket well healed; without hematoma or erythema.  There is no tethering   Regular rate and rhythm, no murmurs gallops or rub Soft with active bowel sounds No clubbing cyanosis  Edema Alert and oriented, grossly normal motor and sensory function Skin Warm and Dry  Electrocardiogram was ordered today and this demonstrated atrially paced at 73 Intervals 15/14/43 QRS upright in lead V1 with an S wave initially in lead 1  .   Ischemic//nonischemic Cardiomyopathy  CHF chronic Systolic  Ventricular Ectopy//PVCs and VT-Nonsustained  Left bundle branch block  Sinus bradycardia    She is much improved. We'll continue her on her current medications. Her blood pressure might allow the use of losartan and/or Entresto. I'll defer these decisions to Dr. Wyline Mood.   She is 94% ventricularly paced with some ventricular sensed response which relate to her ventricular ectopics.

## 2015-07-23 NOTE — Patient Instructions (Signed)
Medication Instructions: - no changes  Labwork: - none  Procedures/Testing: - none  Follow-Up: - Remote monitoring is used to monitor your Pacemaker of ICD from home. This monitoring reduces the number of office visits required to check your device to one time per year. It allows [place] to keep an eye on the functioning of your device to ensure it is working properly. You are scheduled for a device check from home on 10/22/15. You may send your transmission at any time that day. If you have a wireless device, the transmission will be sent automatically. After your physician reviews your transmission, you will receive a postcard with your next transmission date.  - Your physician wants you to follow-up in: June 2017. You will receive a reminder letter in the mail two months in advance. If you don't receive a letter, please call our office to schedule the follow-up appointment.  Any Additional Special Instructions Will Be Listed Below (If Applicable). - none

## 2015-08-07 ENCOUNTER — Encounter: Payer: Self-pay | Admitting: Cardiology

## 2015-08-07 ENCOUNTER — Ambulatory Visit (INDEPENDENT_AMBULATORY_CARE_PROVIDER_SITE_OTHER): Payer: Medicare Other | Admitting: Cardiology

## 2015-08-07 VITALS — BP 110/58 | HR 92 | Ht 63.0 in | Wt 155.6 lb

## 2015-08-07 DIAGNOSIS — I5022 Chronic systolic (congestive) heart failure: Secondary | ICD-10-CM | POA: Diagnosis not present

## 2015-08-07 DIAGNOSIS — I255 Ischemic cardiomyopathy: Secondary | ICD-10-CM

## 2015-08-07 DIAGNOSIS — Z23 Encounter for immunization: Secondary | ICD-10-CM

## 2015-08-07 DIAGNOSIS — E785 Hyperlipidemia, unspecified: Secondary | ICD-10-CM

## 2015-08-07 DIAGNOSIS — I2510 Atherosclerotic heart disease of native coronary artery without angina pectoris: Secondary | ICD-10-CM

## 2015-08-07 NOTE — Progress Notes (Signed)
Patient ID: Nicole Clements, female   DOB: Apr 26, 1941, 74 y.o.   MRN: [id number]     Clinical Summary Nicole Clements is a 74 y.o.female seen today for follow up of the following medical problems.  1. 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 Admit 07/2014 with CHF and troponin elevation - cath 07/2014, LM 30% distal, LAD mid 50%, LCX ostial 50% and mid 50%, small intermediate Munirah Doerner 70%. RCA diffuse 50%, distal RCA with ulcerated 90% lesion, PDA 80-90% too small for PCI. LVEF 30%. Received DES to distal RCA, post procedure the PDA became occluded.  - echo 07/2014 LVEF 20%, inferior akinesis, grade II diastolic dysfunction. Repeat echo 10/2014 LVEF remains 15-20%.  - 04/2015 CRT-D device placed by Dr Graciela Husbands, recent device check 07/2015  - no chest pain, no SOB. No LE edema, no orthopnea - limiting sodium intake. Avoiding NSAIDs. - check weights, typically 155-158 lbs. - compliant with meds. Tolerating well without side effects.    2. Mitral regurgitation - mild to moderate by echo - denies any significant SOB or LE edema  3. Hyperlipidemia - atorvastatin too expensive, she has not been able to get at home.  - compliant with pravastatin  daily  4. CVA - admit 04/2015 with CVA - ICD evaluated, no evidence of afib or aflutter   Past Medical History  Diagnosis Date  . STEMI (ST elevation myocardial infarction) 07/22/1996  . CAD (coronary artery disease) April 2014    DES to PLA, occluded PDA 07/2014  . Hypertension   . Hypercholesterolemia   . S/P colonoscopy August 2007    Hyperplastic polyps, rare sigmoid and descending colon diverticulosis, small internal hemorrhoids  . CHF (congestive heart failure)   . Ischemic cardiomyopathy     LVEF 25%-45%  . PVC's (premature ventricular contractions)   . IVCD (intraventricular conduction defect)      Allergies  Allergen Reactions  . Lactose Intolerance (Gi) Other (See Comments)    GI Upset   . Sulfa Antibiotics Rash      Current Outpatient Prescriptions  Medication Sig Dispense Refill  . acetaminophen (TYLENOL) 325 MG tablet Take 2 tablets (650 mg total) by mouth every 4 (four) hours as needed for headache or mild pain.    [name] aspirin EC 81 MG EC tablet Take 1 tablet (81 mg total) by mouth daily.    . carvedilol (COREG) 25 MG tablet Take 1 tablet (25 mg total) by mouth 2 (two) times daily. 60 tablet 6  . Cholecalciferol (VITAMIN D3) 2000 UNITS capsule Take 2,000 Units by mouth daily.      . clopidogrel (PLAVIX) 75 MG tablet Take 1 tablet (75 mg total) by mouth daily with breakfast. 30 tablet 11  . furosemide (LASIX) 80 MG tablet Take 1 tablet (80 mg total) by mouth daily. 30 tablet 3  . losartan (COZAAR) 25 MG tablet Take 1 tablet (25 mg total) by mouth daily. 90 tablet 3  . nitroGLYCERIN (NITROSTAT) 0.4 MG SL tablet Place 1 tablet (0.4 mg total) under the tongue every 5 (five) minutes x 3 doses as needed for chest pain. 25 tablet 2  . Omega-3 Fatty Acids (FISH OIL) 1000 MG CAPS Take 1,000 mg by mouth daily.     . potassium chloride SA (KLOR-CON M20) 20 MEQ tablet Take 1 tablet (20 mEq total) by mouth daily. 90 tablet 3  . pravastatin (PRAVACHOL) 80 MG tablet Take 1 tablet (80 mg total) by mouth every evening. 90 tablet 3  .  spironolactone (ALDACTONE) 25 MG tablet TAKE 1/2 TABLET TWICE A DAY 30 tablet 0   No current facility-administered medications for this visit.     Past Surgical History  Procedure Laterality Date  . Cardiac catheterization  April 2014    Med Rx  . S/p hysterectomy    . Left and right heart catheterization with coronary angiogram N/A 07/17/2014    Procedure: LEFT AND RIGHT HEART CATHETERIZATION WITH CORONARY ANGIOGRAM;  Surgeon: Micheline Chapman, MD;  Location: [facility] CATH LAB;  Service: Cardiovascular;  Laterality: N/A;  . Percutaneous coronary stent intervention (pci-s)  07/17/2014    Procedure: PERCUTANEOUS CORONARY STENT INTERVENTION (PCI-S);  Surgeon: Micheline Chapman, MD;   Location: [facility] CATH LAB;  Service: Cardiovascular;;  Distal RCA  . Ep implantable device  04/18/2015    BV ICD  . Ep implantable device N/A 04/18/2015    Procedure: BiV ICD Insertion CRT-D;  Surgeon: Duke Salvia, MD;  Location: [facility] INVASIVE CV LAB;  Service: Cardiovascular;  Laterality: N/A;     Allergies  Allergen Reactions  . Lactose Intolerance (Gi) Other (See Comments)    GI Upset   . Sulfa Antibiotics Rash      Family History  Problem Relation Age of Onset  . Colon cancer Neg Hx   . Hypertension Sister   . Diabetes Mellitus II Sister   . Hypertension Brother   . Diabetes Mellitus II Brother   . Hypertension Brother   . Diabetes Mellitus II Brother   . Hypertension Brother   . Diabetes Mellitus II Brother      Social History Nicole Clements reports that she has been smoking Cigarettes.  She started smoking about 53 years ago. She has a 25 pack-year smoking history. She has never used smokeless tobacco. Nicole Clements reports that she does not drink alcohol.   Review of Systems CONSTITUTIONAL: No weight loss, fever, chills, weakness or fatigue.  HEENT: Eyes: No visual loss, blurred vision, double vision or yellow sclerae.No hearing loss, sneezing, congestion, runny nose or sore throat.  SKIN: No rash or itching.  CARDIOVASCULAR: per HPI RESPIRATORY: No shortness of breath, cough or sputum.  GASTROINTESTINAL: No anorexia, nausea, vomiting or diarrhea. No abdominal pain or blood.  GENITOURINARY: No burning on urination, no polyuria NEUROLOGICAL: No headache, dizziness, syncope, paralysis, ataxia, numbness or tingling in the extremities. No change in bowel or bladder control.  MUSCULOSKELETAL: No muscle, back pain, joint pain or stiffness.  LYMPHATICS: No enlarged nodes. No history of splenectomy.  PSYCHIATRIC: No history of depression or anxiety.  ENDOCRINOLOGIC: No reports of sweating, cold or heat intolerance. No polyuria or polydipsia.  [name]   Physical Examination Filed  Vitals:   08/07/15 1026  BP: 110/58  Pulse: 92   Filed Vitals:   08/07/15 1026  Height: 5\' 3"  (1.6 m)  Weight: 155 lb 9.6 oz (70.58 kg)    Gen: resting comfortably, no acute distress HEENT: no scleral icterus, pupils equal round and reactive, no palptable cervical adenopathy,  CV: RRR, no m/r/g, no JVD Resp: Clear to auscultation bilaterally GI: abdomen is soft, non-tender, non-distended, normal bowel sounds, no hepatosplenomegaly MSK: extremities are warm, no edema.  Skin: warm, no rash Neuro:  no focal deficits Psych: appropriate affect   Diagnostic Studies 02/2013 Cath  Procedural Findings:  Hemodynamics:  AO 116/45 with a mean of 70  LV 120/18  Coronary angiography:  Coronary dominance: right  Left mainstem: The left main is patent with 30% distal left main stenosis as the  vessel trifurcates into the LAD, intermediate Nicole Clements, and left circumflex.  Left anterior descending (LAD): The LAD is patent with diffuse disease noted. There is minimal calcification present. The first diagonal is patent with 30-50% diffuse disease. The LAD after the first septal perforator has 40-50% stenosis. The vessel reaches the apex and wraps around the left ventricular apex without significant stenosis.  Left circumflex (LCx): The intermediate Nicole Clements is small in caliber and diffusely diseased with 50-60% proximal vessel stenosis the AV groove circumflex is diffusely diseased with 50% ostial stenosis and 30% mid stenosis it supplies 2 small posterolateral Nicole Clements  Right coronary artery (RCA): The RCA is diffusely diseased. There is significant tortuosity, especially around the junction of the mid and distal vessel. The vessel is diffusely calcified. The mid vessel has tandem 50% stenoses. The distal vessel has 80-90 % stenosis just before the bifurcation of the PDA and posterior AV segment. The PDA and posterior AV segment with 3 posterolateral branches are patent.  Left ventriculography: Left  ventricular systolic function is moderately depressed. There is severe hypokinesis of the basal and midinferior walls. The anterolateral and apical walls contract normally. The left ventricular ejection fraction is estimated at 45%.  Abdominal aortogram: The abdominal aorta is patent. The renal arteries are single bilaterally and they are widely patent. There appears to be nonobstructive stenosis of the right common iliac artery at the aortoiliac junction. The left common iliac artery appears to have significant ostial stenosis estimated at at least 75% angiographically. The visualized portions of the external iliac and common femoral arteries are patent  Final Conclusions:  1. Severe single-vessel coronary artery disease involving the distal right coronary artery  2. Moderate LV dysfunction with segmental severe hypokinesis of the inferior wall and normal contraction of the antero-apex with estimated left ventricular ejection fraction of 45%.  3. Severe left common iliac artery stenosis  Recommendations: The patient has severe distal RCA disease. She has a known history of inferior wall infarction and remote PCA. This fits with that clinical history. She has nonobstructive disease of the left coronary artery. Her left ventricular ejection fraction by ventriculography is clearly greater than 35%. With her history of previous MI, I do not think PCI is indicated with an absence of angina. Will carefully discussed symptoms with the patient, but I am inclined to treat her medically. Will also review indication for consideration of peripheral intervention for her iliac disease.    01/2013 Echo  Study Conclusions  - Left ventricle: The cavity size was mildly to moderately dilated. Wall thickness was normal. Systolic function was severely reduced. The estimated ejection fraction was 25%. Severe diffuse hypokinesis. - Aortic valve: Mildly calcified annulus. Trileaflet. - Mitral valve: Calcified  annulus. - Left atrium: The atrium was moderately dilated. - Right ventricle: The cavity size was normal. Wall thickness was mildly to moderately increased. - Right atrium: The atrium was mildly dilated. - Atrial septum: No defect or patent foramen ovale was identified. Impressions:  - Compared to the prior study of 03/13/11, there has been substantial deterioration in LV systolic function.   07/2014 Echo Study Conclusions  - Left ventricle: The cavity size was severely dilated. Wall thickness was normal. The estimated ejection fraction was 20%. Diffuse hypokinesis. There is akinesis of the inferolateral and inferior myocardium. Features are consistent with a pseudonormal left ventricular filling pattern, with concomitant abnormal relaxation and increased filling pressure (grade 2 diastolic dysfunction). Doppler parameters are consistent with high ventricular filling pressure. - Mitral valve: There was  mild to moderate regurgitation. - Left atrium: The atrium was moderately dilated.  Impressions:  - Global hypokinesis; inferior and inferolateral akinesis; overall severely reduced LV function; grade 2 diastolic dysfunction with elevated left ventricular filling pressures, moderate LAE; mild to moderate MR.  07/2014 Cath PROCEDURAL FINDINGS  Hemodynamics:  AO 122/48  LV 118/14  RA 9  RV 41/11  PA 37/5 with a mean of 21  PCWP 27  Oxygen saturations  AO 90  PA 56  Cardiac output 3.5  Cardiac index 2.0  Coronary angiography:  Coronary dominance: right  Left mainstem: The left mainstem is patent with 30% distal left main stenosis.  Left anterior descending (LAD): The LAD is diffusely diseased in the midportion. There are sequential 50% stenoses, unchanged from previous study. The first diagonal Sebastion Jun is large in distribution with diffuse nonobstructive disease noted. The mid and distal LAD are patent without significant disease.  Left circumflex (LCx): The  left circumflex has 50% ostial stenosis, 50% mid vessel stenosis, and to OM branches with no significant disease. There is an intermediate Nicole Clements is diffusely diseased with up to 70% stenosis. This vessel is small in caliber.  Right coronary artery (RCA): Dominant vessel. Tortuous with diffuse nonobstructive stenosis throughout the proximal and mid portions. There are diffuse 50% stenosis present. The distal vessel has an ulcerated-appearing irregular 90% stenosis involving the bifurcation of the PDA and posterolateral branches. The posterolateral branches are patent with mild diffuse disease. The PDA arises an acute angulation with 30-40% stenosis. The mid body of the PDA has an 80-90% stenosis in an area where the vessel is too small for PCI.  Left ventriculography: The ventricle appears the synchronous. The basal and midinferior wall are akinetic. The basal inferolateral wall is severely hypokinetic. The anterolateral wall and apex contract normally. The estimated LVEF is 30%.  PCI Procedure Note: Following the diagnostic procedure, the decision was made to proceed with PCI of the distal RCA. The lesion is complex, there is a large amount of myocardium involved. I felt there was a risk of potentially compromising the PDA Nicole Clements, but overall in my opinion the risk/benefit was favorable for PCI considering the severe stenosis involving the entire RCA distribution. The sheath was upsized to a 6 [place]. Weight-based bivalirudin was given for anticoagulation. The patient was loaded with Plavix 600 mg on the table. Once a therapeutic ACT was achieved, a 6 [place] JR 4 guide catheter was inserted. A cougar coronary guidewire was used to cross the lesion into the PLA Nicole Clements. The lesion was predilated with a 2.5 x 15 mm balloon. The lesion was then stented with a 3.0 by 16mm Promus DES stent. The stent was postdilated with a 3.25 mm noncompliant balloon to 18 atmospheres. The PDA was stented across as the stent  extended from the distal RCA proximal PLA Nicole Clements. The PDA was totally occluded. I made a long attempt with a whisper wire using multiple bands on the tip of the wire, but all of these attempts were unsuccessful at rewiring the PDA. The patient was completely asymptomatic and specifically denied any chest pain. Her hemodynamics remained stable and there were no changes in her rhythm or blood pressure. A final image of the left coronary artery was obtained and this demonstrated a collateral from the LAD to the PDA Nicole Clements. Following PCI, there was 0% residual stenosis and TIMI-3 flow. Final angiography confirmed an excellent result. Femoral hemostasis was achieved will be achieved with manual hemostasis. The patient tolerated the PCI procedure  well. There were no immediate procedural complications. The patient was transferred to the post catheterization recovery area for further monitoring.  PCI Data:  Vessel - RCA/Segment - distal  Percent Stenosis (pre) 90  TIMI-flow 3  Stent 3.0 by 79mm Promus DES  Percent Stenosis (post) 0  TIMI-flow (post) 3  Contrast: 175 cc Omnipaque  Radiation dose/Fluoro time: 15.5 minutes  Estimated Blood Loss: Minimal  Final Conclusions:  1. Nonobstructive disease involving the LAD, left circumflex, and OM/diagonal side branches  2. Severe stenosis of the distal RCA bifurcation, treated successfully with a drug-eluting stent into the PLA Nicole Clements with residual occlusion of the PDA Abrahim Sargent  3. Severe segmental LV systolic dysfunction  Recommendations: Dual antiplatelet therapy with aspirin and Plavix for at least 12 months. Will cycle cardiac enzymes since the PDA Claryce Friel is occluded after stenting.  10/2014 Echo Study Conclusions  - Procedure narrative: Transthoracic echocardiography. Image quality was suboptimal. The study was technically difficult, as a result of poor sound wave transmission and body habitus. - Left ventricle: Systolic function is  severely reduced, estimated EF 15-20%. The cavity size was severely dilated. Doppler parameters are consistent with abnormal left ventricular relaxation (grade 1 diastolic dysfunction). Doppler parameters are consistent with high ventricular filling pressure. Medial E/e&' 24. - Regional wall motion abnormality: Akinesis of the basal-mid inferior and basal inferolateral myocardium; severe hypokinesis of the apical inferior and mid inferolateral myocardium. Severe, diffuse hypokinesis. - Ventricular septum: Septal motion showed abnormal function and dyssynergy. These changes are consistent with intraventricular conduction delay. - Aortic valve: Mildly calcified annulus. Trileaflet; mildly thickened leaflets. - Mitral valve: Mildly dilated annulus. Mildly thickened leaflets . There was moderate eccentric regurgitation. 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 - Left atrium: The atrium was severely dilated. Volume/bsa, S: 47.1 ml/m^2. - Right ventricle: Systolic function was mildly reduced. - Right atrium: The atrium was mildly dilated. - Atrial septum: There was increased thickness of the septum, consistent with lipomatous hypertrophy. - Tricuspid valve: There was mild regurgitation. - Pulmonary arteries: PA peak pressure: 42 mm Hg (S). Mildly elevated pulmonary pressures. - Pericardium, extracardiac: There was a small pericardial effusion, with no evidence of tamponade physiology.  04/2015 Carotid US IMPRESSION: 1. Mild (1-49%) stenosis of the proximal right internal carotid artery secondary to smooth heterogeneous atherosclerotic plaque. No interval change compared to recent prior imaging from earlier this month. 2. Mild (1-49%) stenosis of the proximal left internal carotid artery secondary to heterogeneous and irregular atherosclerotic plaque. No interval change compared to recent prior imaging from earlier this month. 3. Vertebral  arteries remain patent with normal antegrade flow. Signed,    Assessment and Plan  1. CAD - no current symptoms, she has completed a year of DAPT, will stop her plavix. Given her CVA neuro has asked to increase her ASA to 325mg  daily since stopping plavix  2. Chronic systolic HF - echo 04/2015 LVEF 20-25% , NYHA II, she recently had CRT-D device placed by Dr Graciela Husbands - likely repeat echo in several months on medical therapy and with CRT-D - continue current meds  3. Mitral regurgitation Mild to moderate by last echo, continue to follow clinically.  4. Hyperlipidemia - options limited by cost, continue pravastatin 80mg  daily  5. CVA - continue to follow with neuro   F/u  Jan 2017.       Antoine Poche, M.D.

## 2015-08-07 NOTE — Patient Instructions (Signed)
Your physician recommends that you schedule a follow-up appointment in: January with Dr Wyline Mood   Get ECHO in December   STOP PLavix    INCREASE Aspirin to 325 mg daily     Thank you for choosing [facility] !

## 2015-08-21 ENCOUNTER — Other Ambulatory Visit: Payer: Self-pay | Admitting: Cardiovascular Disease

## 2015-08-21 NOTE — Telephone Encounter (Signed)
Rx(s) sent to pharmacy electronically.  

## 2015-10-04 ENCOUNTER — Ambulatory Visit (HOSPITAL_COMMUNITY)
Admission: RE | Admit: 2015-10-04 | Discharge: 2015-10-04 | Disposition: A | Discharge status: Home / Self Care | Payer: Medicare Other | Source: Ambulatory Visit | Attending: Cardiology | Admitting: Cardiology

## 2015-10-04 ENCOUNTER — Other Ambulatory Visit: Payer: Self-pay

## 2015-10-04 DIAGNOSIS — I5022 Chronic systolic (congestive) heart failure: Secondary | ICD-10-CM | POA: Diagnosis not present

## 2015-10-04 DIAGNOSIS — I427 Cardiomyopathy due to drug and external agent: Secondary | ICD-10-CM

## 2015-10-04 DIAGNOSIS — I429 Cardiomyopathy, unspecified: Secondary | ICD-10-CM | POA: Diagnosis not present

## 2015-10-04 MED ORDER — PRAVASTATIN SODIUM 80 MG PO TABS: 80.0000 mg | ORAL_TABLET | Freq: Every evening | ORAL | Status: DC

## 2015-10-04 NOTE — Telephone Encounter (Signed)
Refill escribed

## 2015-10-22 ENCOUNTER — Encounter: Payer: Medicare Other | Admitting: *Deleted

## 2015-10-22 ENCOUNTER — Telehealth: Payer: Self-pay | Admitting: Cardiology

## 2015-10-22 NOTE — Telephone Encounter (Signed)
Spoke with pt and reminded pt of remote transmission that is due today. Pt verbalized understanding.   

## 2015-10-23 ENCOUNTER — Encounter: Payer: Self-pay | Admitting: Cardiology

## 2015-11-16 ENCOUNTER — Encounter: Payer: Self-pay | Admitting: Cardiology

## 2015-11-16 ENCOUNTER — Ambulatory Visit (INDEPENDENT_AMBULATORY_CARE_PROVIDER_SITE_OTHER): Payer: Medicare Other | Admitting: Cardiology

## 2015-11-16 VITALS — BP 106/78 | HR 90 | Ht 62.0 in | Wt 144.0 lb

## 2015-11-16 DIAGNOSIS — E785 Hyperlipidemia, unspecified: Secondary | ICD-10-CM | POA: Diagnosis not present

## 2015-11-16 DIAGNOSIS — I2510 Atherosclerotic heart disease of native coronary artery without angina pectoris: Secondary | ICD-10-CM | POA: Diagnosis not present

## 2015-11-16 DIAGNOSIS — I5022 Chronic systolic (congestive) heart failure: Secondary | ICD-10-CM

## 2015-11-16 MED ORDER — ATORVASTATIN CALCIUM 80 MG PO TABS: 80.0000 mg | ORAL_TABLET | Freq: Every day | ORAL | Status: DC

## 2015-11-16 MED ORDER — CARVEDILOL 25 MG PO TABS: 25.0000 mg | ORAL_TABLET | Freq: Two times a day (BID) | ORAL | Status: DC

## 2015-11-16 NOTE — Progress Notes (Signed)
Patient ID: Nicole Clements, female   DOB: 1941/04/01, 75 y.o.   MRN: [id number]     Clinical Summary Nicole Clements is a 75 y.o.female seen today for follow up of the following medical problems.   1. 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 Admit 07/2014 with CHF and troponin elevation - cath 07/2014, LM 30% distal, LAD mid 50%, LCX ostial 50% and mid 50%, small intermediate Nicole Clements 70%. RCA diffuse 50%, distal RCA with ulcerated 90% lesion, PDA 80-90% too small for PCI. LVEF 30%. Received DES to distal RCA, post procedure the PDA became occluded.  - echo 07/2014 LVEF 20%, inferior akinesis, grade II diastolic dysfunction. Repeat echo 10/2014 LVEF remains 15-20%.  - 04/2015 CRT-D device placed by Dr Graciela Husbands, recent device check 07/2015. From notes she was to have a remote transmission but don't see where reported.  10/2015 echo LVEF 25-30%  - denies any chest pain. No SOB or DOE. No LE edema - checking weights daily, 145-148. Down from mid 150s a few months ago - compliant with meds   2. Mitral regurgitation - mild by most recent echo - denies any significant SOB or LE edema  3. Hyperlipidemia - compliant with pravastatin 80mg  daily - 04/2015 TC 138 TG 117 HDL 32 LDL 83  4. CVA - admit 04/2015 with CVA - ICD evaluated, no evidence of afib or aflutter   Past Medical History  Diagnosis Date  . STEMI (ST elevation myocardial infarction) (HCC) 07/22/1996  . CAD (coronary artery disease) April 2014    DES to PLA, occluded PDA 07/2014  . Hypertension   . Hypercholesterolemia   . S/P colonoscopy August 2007    Hyperplastic polyps, rare sigmoid and descending colon diverticulosis, small internal hemorrhoids  . CHF (congestive heart failure) (HCC)   . Ischemic cardiomyopathy     LVEF 25%-45%  . PVC's (premature ventricular contractions)   . IVCD (intraventricular conduction defect)      Allergies  Allergen Reactions  . Lactose Intolerance (Gi) Other (See Comments)    GI  Upset   . Sulfa Antibiotics Rash     Current Outpatient Prescriptions  Medication Sig Dispense Refill  . acetaminophen (TYLENOL) 325 MG tablet Take 2 tablets (650 mg total) by mouth every 4 (four) hours as needed for headache or mild pain.    [name] aspirin 325 MG EC tablet Take 325 mg by mouth daily.    . carvedilol (COREG) 25 MG tablet Take 1 tablet (25 mg total) by mouth 2 (two) times daily. 60 tablet 6  . Cholecalciferol (VITAMIN D3) 2000 UNITS capsule Take 2,000 Units by mouth daily.      . furosemide (LASIX) 80 MG tablet Take 1 tablet (80 mg total) by mouth daily. 30 tablet 3  . losartan (COZAAR) 25 MG tablet Take 1 tablet (25 mg total) by mouth daily. 90 tablet 3  . nitroGLYCERIN (NITROSTAT) 0.4 MG SL tablet Place 1 tablet (0.4 mg total) under the tongue every 5 (five) minutes x 3 doses as needed for chest pain. 25 tablet 2  . Omega-3 Fatty Acids (FISH OIL) 1000 MG CAPS Take 1,000 mg by mouth daily.     . potassium chloride SA (KLOR-CON M20) 20 MEQ tablet Take 1 tablet (20 mEq total) by mouth daily. 90 tablet 3  . pravastatin (PRAVACHOL) 80 MG tablet Take 1 tablet (80 mg total) by mouth every evening. 90 tablet 3  . spironolactone (ALDACTONE) 25 MG tablet Take 0.5 tablets (12.5 mg total)  by mouth 2 (two) times daily. 30 tablet 3   No current facility-administered medications for this visit.     Past Surgical History  Procedure Laterality Date  . Cardiac catheterization  April 2014    Med Rx  . S/p hysterectomy    . Left and right heart catheterization with coronary angiogram N/A 07/17/2014    Procedure: LEFT AND RIGHT HEART CATHETERIZATION WITH CORONARY ANGIOGRAM;  Surgeon: Micheline Chapman, MD;  Location: [facility] CATH LAB;  Service: Cardiovascular;  Laterality: N/A;  . Percutaneous coronary stent intervention (pci-s)  07/17/2014    Procedure: PERCUTANEOUS CORONARY STENT INTERVENTION (PCI-S);  Surgeon: Micheline Chapman, MD;  Location: [facility] CATH LAB;  Service: Cardiovascular;;  Distal RCA  .  Ep implantable device  04/18/2015    BV ICD  . Ep implantable device N/A 04/18/2015    Procedure: BiV ICD Insertion CRT-D;  Surgeon: Duke Salvia, MD;  Location: [facility] INVASIVE CV LAB;  Service: Cardiovascular;  Laterality: N/A;     Allergies  Allergen Reactions  . Lactose Intolerance (Gi) Other (See Comments)    GI Upset   . Sulfa Antibiotics Rash      Family History  Problem Relation Age of Onset  . Colon cancer Neg Hx   . Hypertension Sister   . Diabetes Mellitus II Sister   . Hypertension Brother   . Diabetes Mellitus II Brother   . Hypertension Brother   . Diabetes Mellitus II Brother   . Hypertension Brother   . Diabetes Mellitus II Brother      Social History Nicole Clements reports that she has been smoking Cigarettes.  She started smoking about 53 years ago. She has a 25 pack-year smoking history. She has never used smokeless tobacco. Nicole Clements reports that she does not drink alcohol.   Review of Systems CONSTITUTIONAL: No weight loss, fever, chills, weakness or fatigue.  HEENT: Eyes: No visual loss, blurred vision, double vision or yellow sclerae.No hearing loss, sneezing, congestion, runny nose or sore throat.  SKIN: No rash or itching.  CARDIOVASCULAR: per hpi RESPIRATORY: No shortness of breath, cough or sputum.  GASTROINTESTINAL: No anorexia, nausea, vomiting or diarrhea. No abdominal pain or blood.  GENITOURINARY: No burning on urination, no polyuria NEUROLOGICAL: No headache, dizziness, syncope, paralysis, ataxia, numbness or tingling in the extremities. No change in bowel or bladder control.  MUSCULOSKELETAL: No muscle, back pain, joint pain or stiffness.  LYMPHATICS: No enlarged nodes. No history of splenectomy.  PSYCHIATRIC: No history of depression or anxiety.  ENDOCRINOLOGIC: No reports of sweating, cold or heat intolerance. No polyuria or polydipsia.  [name]   Physical Examination Filed Vitals:   11/16/15 1307  BP: 106/78  Pulse: 90   Filed Vitals:     11/16/15 1307  Height: 5\' 2"  (1.575 m)  Weight: 144 lb (65.318 kg)    Gen: resting comfortably, no acute distress HEENT: no scleral icterus, pupils equal round and reactive, no palptable cervical adenopathy,  CV: RRR, no m/r/g, no jvd Resp: Clear to auscultation bilaterally GI: abdomen is soft, non-tender, non-distended, normal bowel sounds, no hepatosplenomegaly MSK: extremities are warm, no edema.  Skin: warm, no rash Neuro:  no focal deficits Psych: appropriate affect   Diagnostic Studies 02/2013 Cath  Procedural Findings:  Hemodynamics:  AO 116/45 with a mean of 70  LV 120/18  Coronary angiography:  Coronary dominance: right  Left mainstem: The left main is patent with 30% distal left main stenosis as the vessel trifurcates into the LAD, intermediate  Tannisha Kennington, and left circumflex.  Left anterior descending (LAD): The LAD is patent with diffuse disease noted. There is minimal calcification present. The first diagonal is patent with 30-50% diffuse disease. The LAD after the first septal perforator has 40-50% stenosis. The vessel reaches the apex and wraps around the left ventricular apex without significant stenosis.  Left circumflex (LCx): The intermediate Lenford Beddow is small in caliber and diffusely diseased with 50-60% proximal vessel stenosis the AV groove circumflex is diffusely diseased with 50% ostial stenosis and 30% mid stenosis it supplies 2 small posterolateral Shaunte Tuft  Right coronary artery (RCA): The RCA is diffusely diseased. There is significant tortuosity, especially around the junction of the mid and distal vessel. The vessel is diffusely calcified. The mid vessel has tandem 50% stenoses. The distal vessel has 80-90 % stenosis just before the bifurcation of the PDA and posterior AV segment. The PDA and posterior AV segment with 3 posterolateral branches are patent.  Left ventriculography: Left ventricular systolic function is moderately depressed. There is severe  hypokinesis of the basal and midinferior walls. The anterolateral and apical walls contract normally. The left ventricular ejection fraction is estimated at 45%.  Abdominal aortogram: The abdominal aorta is patent. The renal arteries are single bilaterally and they are widely patent. There appears to be nonobstructive stenosis of the right common iliac artery at the aortoiliac junction. The left common iliac artery appears to have significant ostial stenosis estimated at at least 75% angiographically. The visualized portions of the external iliac and common femoral arteries are patent  Final Conclusions:  1. Severe single-vessel coronary artery disease involving the distal right coronary artery  2. Moderate LV dysfunction with segmental severe hypokinesis of the inferior wall and normal contraction of the antero-apex with estimated left ventricular ejection fraction of 45%.  3. Severe left common iliac artery stenosis  Recommendations: The patient has severe distal RCA disease. She has a known history of inferior wall infarction and remote PCA. This fits with that clinical history. She has nonobstructive disease of the left coronary artery. Her left ventricular ejection fraction by ventriculography is clearly greater than 35%. With her history of previous MI, I do not think PCI is indicated with an absence of angina. Will carefully discussed symptoms with the patient, but I am inclined to treat her medically. Will also review indication for consideration of peripheral intervention for her iliac disease.    01/2013 Echo  Study Conclusions  - Left ventricle: The cavity size was mildly to moderately dilated. Wall thickness was normal. Systolic function was severely reduced. The estimated ejection fraction was 25%. Severe diffuse hypokinesis. - Aortic valve: Mildly calcified annulus. Trileaflet. - Mitral valve: Calcified annulus. - Left atrium: The atrium was moderately dilated. - Right  ventricle: The cavity size was normal. Wall thickness was mildly to moderately increased. - Right atrium: The atrium was mildly dilated. - Atrial septum: No defect or patent foramen ovale was identified. Impressions:  - Compared to the prior study of 03/13/11, there has been substantial deterioration in LV systolic function.   07/2014 Echo Study Conclusions  - Left ventricle: The cavity size was severely dilated. Wall thickness was normal. The estimated ejection fraction was 20%. Diffuse hypokinesis. There is akinesis of the inferolateral and inferior myocardium. Features are consistent with a pseudonormal left ventricular filling pattern, with concomitant abnormal relaxation and increased filling pressure (grade 2 diastolic dysfunction). Doppler parameters are consistent with high ventricular filling pressure. - Mitral valve: There was mild to moderate regurgitation. - Left  atrium: The atrium was moderately dilated.  Impressions:  - Global hypokinesis; inferior and inferolateral akinesis; overall severely reduced LV function; grade 2 diastolic dysfunction with elevated left ventricular filling pressures, moderate LAE; mild to moderate MR.  07/2014 Cath PROCEDURAL FINDINGS  Hemodynamics:  AO 122/48  LV 118/14  RA 9  RV 41/11  PA 37/5 with a mean of 21  PCWP 27  Oxygen saturations  AO 90  PA 56  Cardiac output 3.5  Cardiac index 2.0  Coronary angiography:  Coronary dominance: right  Left mainstem: The left mainstem is patent with 30% distal left main stenosis.  Left anterior descending (LAD): The LAD is diffusely diseased in the midportion. There are sequential 50% stenoses, unchanged from previous study. The first diagonal Gracynn Rajewski is large in distribution with diffuse nonobstructive disease noted. The mid and distal LAD are patent without significant disease.  Left circumflex (LCx): The left circumflex has 50% ostial stenosis, 50% mid vessel stenosis,  and to OM branches with no significant disease. There is an intermediate Giles Currie is diffusely diseased with up to 70% stenosis. This vessel is small in caliber.  Right coronary artery (RCA): Dominant vessel. Tortuous with diffuse nonobstructive stenosis throughout the proximal and mid portions. There are diffuse 50% stenosis present. The distal vessel has an ulcerated-appearing irregular 90% stenosis involving the bifurcation of the PDA and posterolateral branches. The posterolateral branches are patent with mild diffuse disease. The PDA arises an acute angulation with 30-40% stenosis. The mid body of the PDA has an 80-90% stenosis in an area where the vessel is too small for PCI.  Left ventriculography: The ventricle appears the synchronous. The basal and midinferior wall are akinetic. The basal inferolateral wall is severely hypokinetic. The anterolateral wall and apex contract normally. The estimated LVEF is 30%.  PCI Procedure Note: Following the diagnostic procedure, the decision was made to proceed with PCI of the distal RCA. The lesion is complex, there is a large amount of myocardium involved. I felt there was a risk of potentially compromising the PDA Ramir Malerba, but overall in my opinion the risk/benefit was favorable for PCI considering the severe stenosis involving the entire RCA distribution. The sheath was upsized to a 6 [place]. Weight-based bivalirudin was given for anticoagulation. The patient was loaded with Plavix 600 mg on the table. Once a therapeutic ACT was achieved, a 6 [place] JR 4 guide catheter was inserted. A cougar coronary guidewire was used to cross the lesion into the PLA Alvenia Treese. The lesion was predilated with a 2.5 x 15 mm balloon. The lesion was then stented with a 3.0 by 30mm Promus DES stent. The stent was postdilated with a 3.25 mm noncompliant balloon to 18 atmospheres. The PDA was stented across as the stent extended from the distal RCA proximal PLA Vernella Niznik. The PDA was totally  occluded. I made a long attempt with a whisper wire using multiple bands on the tip of the wire, but all of these attempts were unsuccessful at rewiring the PDA. The patient was completely asymptomatic and specifically denied any chest pain. Her hemodynamics remained stable and there were no changes in her rhythm or blood pressure. A final image of the left coronary artery was obtained and this demonstrated a collateral from the LAD to the PDA Darienne Belleau. Following PCI, there was 0% residual stenosis and TIMI-3 flow. Final angiography confirmed an excellent result. Femoral hemostasis was achieved will be achieved with manual hemostasis. The patient tolerated the PCI procedure well. There were no immediate procedural  complications. The patient was transferred to the post catheterization recovery area for further monitoring.  PCI Data:  Vessel - RCA/Segment - distal  Percent Stenosis (pre) 90  TIMI-flow 3  Stent 3.0 by 16mm Promus DES  Percent Stenosis (post) 0  TIMI-flow (post) 3  Contrast: 175 cc Omnipaque  Radiation dose/Fluoro time: 15.5 minutes  Estimated Blood Loss: Minimal  Final Conclusions:  1. Nonobstructive disease involving the LAD, left circumflex, and OM/diagonal side branches  2. Severe stenosis of the distal RCA bifurcation, treated successfully with a drug-eluting stent into the PLA Fayetta Sorenson with residual occlusion of the PDA Caidynce Muzyka  3. Severe segmental LV systolic dysfunction  Recommendations: Dual antiplatelet therapy with aspirin and Plavix for at least 12 months. Will cycle cardiac enzymes since the PDA Brailyn Killion is occluded after stenting.  10/2014 Echo Study Conclusions  - Procedure narrative: Transthoracic echocardiography. Image quality was suboptimal. The study was technically difficult, as a result of poor sound wave transmission and body habitus. - Left ventricle: Systolic function is severely reduced, estimated EF 15-20%. The cavity size was severely  dilated. Doppler parameters are consistent with abnormal left ventricular relaxation (grade 1 diastolic dysfunction). Doppler parameters are consistent with high ventricular filling pressure. Medial E/e&' 24. - Regional wall motion abnormality: Akinesis of the basal-mid inferior and basal inferolateral myocardium; severe hypokinesis of the apical inferior and mid inferolateral myocardium. Severe, diffuse hypokinesis. - Ventricular septum: Septal motion showed abnormal function and dyssynergy. These changes are consistent with intraventricular conduction delay. - Aortic valve: Mildly calcified annulus. Trileaflet; mildly thickened leaflets. - Mitral valve: Mildly dilated annulus. Mildly thickened leaflets . There was moderate eccentric regurgitation. 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 - Left atrium: The atrium was severely dilated. Volume/bsa, S: 47.1 ml/m^2. - Right ventricle: Systolic function was mildly reduced. - Right atrium: The atrium was mildly dilated. - Atrial septum: There was increased thickness of the septum, consistent with lipomatous hypertrophy. - Tricuspid valve: There was mild regurgitation. - Pulmonary arteries: PA peak pressure: 42 mm Hg (S). Mildly elevated pulmonary pressures. - Pericardium, extracardiac: There was a small pericardial effusion, with no evidence of tamponade physiology.  04/2015 Carotid US IMPRESSION: 1. Mild (1-49%) stenosis of the proximal right internal carotid artery secondary to smooth heterogeneous atherosclerotic plaque. No interval change compared to recent prior imaging from earlier this month. 2. Mild (1-49%) stenosis of the proximal left internal carotid artery secondary to heterogeneous and irregular atherosclerotic plaque. No interval change compared to recent prior imaging from earlier this month. 3. Vertebral arteries remain patent with normal antegrade  flow. Signed,    10/2015 echo Study Conclusions  - Left ventricle: The cavity size was moderately to severely dilated. Wall thickness was normal. Systolic function was severely reduced. The estimated ejection fraction was in the range of 25% to 30%. Diffuse hypokinesis. There is akinesis of the basalanteroseptal and anterior myocardium. Doppler parameters are consistent with abnormal left ventricular relaxation (grade 1 diastolic dysfunction). - Mitral valve: Calcified annulus. Mildly calcified leaflets . There was mild regurgitation. - Right atrium: Central venous pressure (est): 3 mm Hg. - Atrial septum: No defect or patent foramen ovale was identified. - Tricuspid valve: There was trivial regurgitation. - Pulmonary arteries: PA peak pressure: 27 mm Hg (S). - Pericardium, extracardiac: A prominent pericardial fat pad was present.  Impressions:  - Moderate to severe LV chamber dilatation with LVEF approximately 25-30%. There is diffuse hypokinesis with near akinesis of the basal anteroseptal and anterior myocardium. Compared to the previous study  from June 2016 there has been some improvement in LVEF. Grade 1 diastolic dysfunction. MAC with mild mitral regurgitation. Trivial tricuspid regurgitation with PASP 27 mmHg.  Transthoracic echocardiography. M-mode, complete 2D, spectral Doppler, and color Doppler. Birthdate: Patient birthdate: 03-May-1941. Age: Patient is 75 yr old. Sex: Gender: female. BMI: 27.5 kg/m^2. Blood pressure:   98/47 Patient status: Inpatient. Study date: Study date: 10/04/2015. Study time: 11:27 AM. Location: Echo laboratory.   Assessment and Plan   1. CAD - no current symptoms - she is on high dose ASA due to her history of CVA, we will conitnue  2. Chronic systolic HF - no current symptoms. Will increase coreg to  bid.   3. Mitral regurgitation Mild by last echo, continue to follow clinically.  4.  Hyperlipidemia - in setting of known CAD and prior CVA, change to atorva  daily.   5. CVA - continue to follow with neuro   F/u 4 months   Antoine Poche, M.D.

## 2015-11-16 NOTE — Patient Instructions (Signed)
Your physician wants you to follow-up in: 4 months with Dr Lurena Joiner will receive a reminder letter in the mail two months in advance. If you don't receive a letter, please call our office to schedule the follow-up appointment.    INCREASE Coreg to 25 mg twice a day    STOP Pravastatin   START Atorvastatin 80 mg daily     If you need a refill on your cardiac medications before your next appointment, please call your pharmacy.        Thank you for choosing [facility] !

## 2015-11-25 IMAGING — CR DG CHEST 1V PORT
1 series · 1 of 1 positions shown · non-contrast
Comparison: 01/02/2013

CLINICAL DATA: Shortness of breath

EXAM:
PORTABLE CHEST - 1 VIEW

[portable]
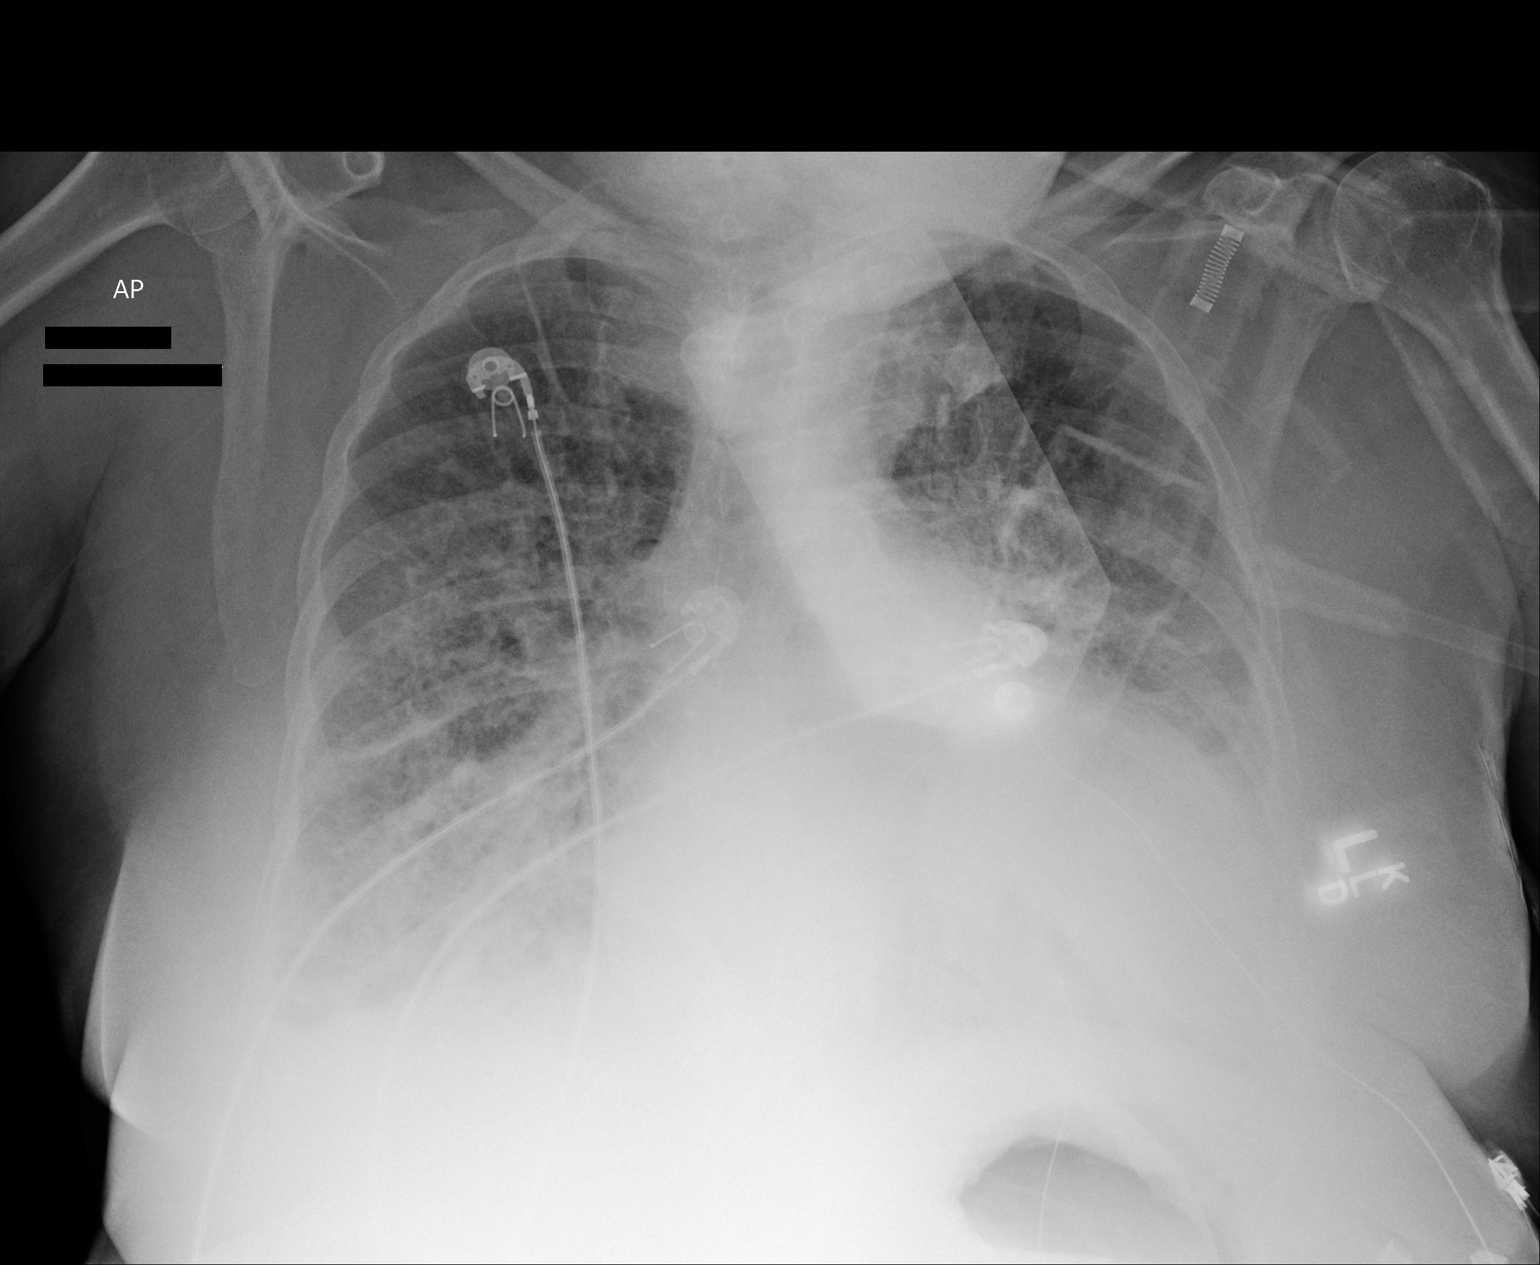

[1 of 1 positions shown; findings below may reference images not displayed]

FINDINGS: Cardiac shadow is again enlarged. Diffuse vascular congestion and
pulmonary edema is noted. Right basilar infiltrate is noted as well.
No acute bony abnormality is seen.
IMPRESSION: Changes consistent with CHF and apparent right basilar infiltrate.

## 2015-12-11 ENCOUNTER — Encounter: Payer: Self-pay | Admitting: Gastroenterology

## 2015-12-11 ENCOUNTER — Ambulatory Visit (INDEPENDENT_AMBULATORY_CARE_PROVIDER_SITE_OTHER): Payer: Medicare Other | Admitting: Gastroenterology

## 2015-12-11 ENCOUNTER — Other Ambulatory Visit: Payer: Self-pay

## 2015-12-11 VITALS — BP 106/52 | HR 60 | Temp 97.8°F | Ht 62.0 in | Wt 143.0 lb

## 2015-12-11 DIAGNOSIS — I2510 Atherosclerotic heart disease of native coronary artery without angina pectoris: Secondary | ICD-10-CM

## 2015-12-11 DIAGNOSIS — R197 Diarrhea, unspecified: Secondary | ICD-10-CM | POA: Insufficient documentation

## 2015-12-11 LAB — CBC
HCT: 38 % (ref 36.0–46.0)
Hemoglobin: 12.2 g/dL (ref 12.0–15.0)
MCH: 27.4 pg (ref 26.0–34.0)
MCHC: 32.1 g/dL (ref 30.0–36.0)
MCV: 85.4 fL (ref 78.0–100.0)
MPV: 10.8 fL (ref 8.6–12.4)
Platelets: 219 10*3/uL (ref 150–400)
RBC: 4.45 MIL/uL (ref 3.87–5.11)
RDW: 13.7 % (ref 11.5–15.5)
WBC: 7 10*3/uL (ref 4.0–10.5)

## 2015-12-11 MED ORDER — PEG 3350-KCL-NA BICARB-NACL 420 G PO SOLR: 4000.0000 mL | Freq: Once | ORAL | Status: DC

## 2015-12-11 MED ORDER — ELUXADOLINE 75 MG PO TABS: 1.0000 | ORAL_TABLET | Freq: Two times a day (BID) | ORAL | Status: DC

## 2015-12-11 NOTE — Patient Instructions (Signed)
Please have blood work completed today and return stool studies to the lab as soon as you can.  Once I review this, we can call and let you know if it's ok to start taking Viberzi. When it is ok, you can start it once a day with food and possibly twice a day. You will need to monitor for any severe abdominal pain, nausea, or vomiting. If you do not have a bowel movement for more than 3 days, call me.  We have scheduled a colonoscopy for you in the near future!

## 2015-12-11 NOTE — Progress Notes (Signed)
Primary Care Physician:  Lupita Raider, MD Primary Gastroenterologist:  Dr. Darrick Penna   Chief Complaint  Patient presents with  . Diarrhea    HPI:   Nicole Clements is a 75 y.o. female presenting today with a history of hyperplastic polyps in 2007, due for routine screening now. Baseline bowel habits 1-2 times per day, but now she is presenting with diarrhea. Down about 20 lbs since 2015. Unintentional weight loss. States after she had the defibrillator placed, she lost her appetite. Diarrhea for a few months now. Will sometimes not have any BMs for a day. Sometimes will eat and have to run to the bathroom. No rectal bleeding. States stool looks darker, not like "normal bowels". Will occasionally have a normal BM between bouts of loose stool. No recent antibiotics. No abdominal pain. Has had a lack of appetite.     Past Medical History  Diagnosis Date  . STEMI (ST elevation myocardial infarction) (HCC) 07/22/1996  . CAD (coronary artery disease) April 2014    DES to PLA, occluded PDA 07/2014  . Hypertension   . Hypercholesterolemia   . S/P colonoscopy August 2007    Hyperplastic polyps, rare sigmoid and descending colon diverticulosis, small internal hemorrhoids  . CHF (congestive heart failure) (HCC)   . Ischemic cardiomyopathy     LVEF 25%-45%  . PVC's (premature ventricular contractions)   . IVCD (intraventricular conduction defect)     Past Surgical History  Procedure Laterality Date  . Cardiac catheterization  April 2014    Med Rx  . S/p hysterectomy    . Left and right heart catheterization with coronary angiogram N/A 07/17/2014    Procedure: LEFT AND RIGHT HEART CATHETERIZATION WITH CORONARY ANGIOGRAM;  Surgeon: Micheline Chapman, MD;  Location: [facility] CATH LAB;  Service: Cardiovascular;  Laterality: N/A;  . Percutaneous coronary stent intervention (pci-s)  07/17/2014    Procedure: PERCUTANEOUS CORONARY STENT INTERVENTION (PCI-S);  Surgeon: Micheline Chapman, MD;  Location: [facility] CATH  LAB;  Service: Cardiovascular;;  Distal RCA  . Ep implantable device  04/18/2015    BV ICD  . Ep implantable device N/A 04/18/2015    Procedure: BiV ICD Insertion CRT-D;  Surgeon: Duke Salvia, MD;  Location: [facility] INVASIVE CV LAB;  Service: Cardiovascular;  Laterality: N/A;    Current Outpatient Prescriptions  Medication Sig Dispense Refill  . acetaminophen (TYLENOL) 325 MG tablet Take 2 tablets (650 mg total) by mouth every 4 (four) hours as needed for headache or mild pain.    [name] aspirin 325 MG EC tablet Take 325 mg by mouth daily.    [name] atorvastatin (LIPITOR) 80 MG tablet Take 1 tablet (80 mg total) by mouth daily. 90 tablet 3  . carvedilol (COREG) 25 MG tablet Take 1 tablet (25 mg total) by mouth 2 (two) times daily. 60 tablet 6  . Cholecalciferol (VITAMIN D3) 2000 UNITS capsule Take 2,000 Units by mouth daily.      . furosemide (LASIX) 80 MG tablet Take 1 tablet (80 mg total) by mouth daily. 30 tablet 3  . losartan (COZAAR) 25 MG tablet Take 1 tablet (25 mg total) by mouth daily. 90 tablet 3  . nitroGLYCERIN (NITROSTAT) 0.4 MG SL tablet Place 1 tablet (0.4 mg total) under the tongue every 5 (five) minutes x 3 doses as needed for chest pain. 25 tablet 2  . Omega-3 Fatty Acids (FISH OIL) 1000 MG CAPS Take 1,000 mg by mouth daily.     . potassium chloride SA (KLOR-CON  M20) 20 MEQ tablet Take 1 tablet (20 mEq total) by mouth daily. 90 tablet 3  . spironolactone (ALDACTONE) 25 MG tablet Take 0.5 tablets (12.5 mg total) by mouth 2 (two) times daily. 30 tablet 3   No current facility-administered medications for this visit.    Allergies as of 12/11/2015 - Review Complete 12/11/2015  Allergen Reaction Noted  . Lactose intolerance (gi) Other (See Comments) 04/29/2015  . Sulfa antibiotics Rash 07/16/2011    Family History  Problem Relation Age of Onset  . Colon cancer Neg Hx   . Hypertension Sister   . Diabetes Mellitus II Sister   . Hypertension Brother   . Diabetes Mellitus II Brother     . Hypertension Brother   . Diabetes Mellitus II Brother   . Hypertension Brother   . Diabetes Mellitus II Brother     Social History   Social History  . Marital Status: Single    Spouse Name: N/A  . Number of Children: 1  . Years of Education: N/A   Occupational History  . Retired     Clinical biochemist rep   Social History Main Topics  . Smoking status: Current Every Day Smoker -- 0.50 packs/day for 50 years    Types: Cigarettes    Start date: 02/26/1962  . Smokeless tobacco: Never Used     Comment: down to less than 1/2 ppd  . Alcohol Use: No  . Drug Use: No  . Sexual Activity: Not on file   Other Topics Concern  . Not on file   Social History Narrative    Review of Systems: Gen: see HPI  CV: Denies chest pain, heart palpitations, peripheral edema, syncope.  Resp: Denies shortness of breath at rest or with exertion. Denies wheezing or cough.  GI: see HPI  GU : Denies urinary burning, urinary frequency, urinary hesitancy MS: Denies joint pain, muscle weakness, cramps, or limitation of movement.  Derm: Denies rash, itching, dry skin Psych: Denies depression, anxiety, memory loss, and confusion Heme: Denies bruising, bleeding, and enlarged lymph nodes.  Physical Exam: BP 106/52 mmHg  Pulse 60  Temp(Src) 97.8 F (36.6 C) (Oral)  Ht  (1.575 m)  Wt 143 lb (64.864 kg)  BMI 26.15 kg/m2 General:   Alert and oriented. Pleasant and cooperative. Well-nourished and well-developed.  Head:  Normocephalic and atraumatic. Eyes:  Without icterus, sclera clear and conjunctiva pink.  Ears:  Normal auditory acuity. Nose:  No deformity, discharge,  or lesions. Mouth:  No deformity or lesions, oral mucosa pink.  Lungs:  Clear to auscultation bilaterally. No wheezes, rales, or rhonchi. No distress.  Heart:  S1, S2 present without murmurs appreciated.  Abdomen:  +BS, soft, non-tender and non-distended. No HSM noted. No guarding or rebound. No masses appreciated.  Rectal:   Deferred  Msk:  Symmetrical without gross deformities. Normal posture. Extremities:  Without edema. Neurologic:  Alert and  oriented x4;  grossly normal neurologically. Psych:  Alert and cooperative. Normal mood and affect.

## 2015-12-12 LAB — TSH: TSH: 0.01 mIU/L — ABNORMAL LOW

## 2015-12-12 LAB — COMPLETE METABOLIC PANEL WITH GFR
ALT: 11 U/L (ref 6–29)
AST: 15 U/L (ref 10–35)
Albumin: 3.8 g/dL (ref 3.6–5.1)
Alkaline Phosphatase: 78 U/L (ref 33–130)
BILIRUBIN TOTAL: 0.6 mg/dL (ref 0.2–1.2)
BUN: 27 mg/dL — AB (ref 7–25)
CHLORIDE: 105 mmol/L (ref 98–110)
CO2: 18 mmol/L — AB (ref 20–31)
CREATININE: 1.5 mg/dL — AB (ref 0.60–0.93)
Calcium: 9 mg/dL (ref 8.6–10.4)
GFR, Est African American: 40 mL/min — ABNORMAL LOW (ref >=60)
GFR, Est Non African American: 35 mL/min — ABNORMAL LOW (ref >=60)
GLUCOSE: 94 mg/dL (ref 65–99)
Potassium: 4.8 mmol/L (ref 3.5–5.3)
Sodium: 133 mmol/L — ABNORMAL LOW (ref 135–146)
Total Protein: 6.3 g/dL (ref 6.1–8.1)

## 2015-12-17 DIAGNOSIS — R197 Diarrhea, unspecified: Secondary | ICD-10-CM | POA: Diagnosis not present

## 2015-12-17 NOTE — Progress Notes (Signed)
Quick Note:  Cr has bumped to 1.50. Looks like this is a new finding. Need to drink fluid to keep urine clear. Please copy these labs to PCP. I am not sure what her baseline Cr is, but this is definitely different than 7 months ago. ALSO: her TSH is markedly low, which could definitely explain diarrhea. This needs to be sent to PCP as well, ASAP. No anemia on labs. Let's make sure nurse is aware that works with PCP, as her thyroid state needs to be addressed. It doesn't look like she has any history of thyroid issues? ______

## 2015-12-18 LAB — CLOSTRIDIUM DIFFICILE BY PCR: CDIFFPCR: NOT DETECTED

## 2015-12-18 NOTE — Progress Notes (Signed)
Quick Note:  I called and informed pt. I also called and informed her physician's office. Lupita Raider) Their phone number is [phone] and fax number is [phone].  I spoke with Nehemiah Settle at the office and she is letting Dr. Alver Fisher nurse know and have her call me if she has questions. I am forwarding this to Darl Pikes to fax the reports. Pt is aware she needs to follow up with PCP right away. ______

## 2015-12-18 NOTE — Assessment & Plan Note (Signed)
75 year old female with history of hyperplastic polyps in 2007, due for routine screening now. However, she notes new onset diarrhea for the past several months, which is different from her baseline of 1-2 times per day. No rectal bleeding. Has had no exposure to antibiotics. Denies abdominal pain. Unintentional weight loss noted. Will order stool studies, CBC, CMP, TSH. IF negative stool studies and unrevealing labs, could consider Viberzi 75 mg BID for symptom management in interim until colonoscopy. Although she could take the 100 mg dosage, I would rather start at the 75 mg dosing in this scenario.   Proceed with colonoscopy, random colonic biopsies with Dr. Darrick Penna in the near future. The risks, benefits, and alternatives have been discussed in detail with the patient. They state understanding and desire to proceed.  CBC, CMP, TSH, stool studies now Viberzi samples provided but NOT TO START until we give the go ahead Patient has defibrillator/pacemaker

## 2015-12-18 NOTE — Progress Notes (Signed)
Quick Note:  I had a VM from Amy, Dr. Alver Fisher nurse, saying the next appt would be 12/24/2015. I asked Gerrit Halls, NP and she that is fine as long as Dr. Clelia Croft reviews the labs and thinks that it ok. I called back to speak to Amy and she is at lunch. I spoke to [place] and informed her the labs have been faxed, and the appt time is fine if that is what Dr. Clelia Croft says after she reviews the labs.  They will call me if they have other questions. ______

## 2015-12-19 NOTE — Progress Notes (Signed)
cc'ed to pcp °

## 2015-12-20 LAB — GIARDIA ANTIGEN

## 2015-12-21 LAB — STOOL CULTURE

## 2015-12-24 ENCOUNTER — Encounter (HOSPITAL_COMMUNITY)
Admission: RE | Disposition: A | Discharge status: Home / Self Care | Payer: Self-pay | Source: Ambulatory Visit | Attending: Gastroenterology

## 2015-12-24 ENCOUNTER — Ambulatory Visit (HOSPITAL_COMMUNITY)
Admission: RE | Admit: 2015-12-24 | Discharge: 2015-12-24 | Disposition: A | Discharge status: Home / Self Care | Payer: Medicare Other | Source: Ambulatory Visit | Attending: Gastroenterology | Admitting: Gastroenterology

## 2015-12-24 ENCOUNTER — Encounter (HOSPITAL_COMMUNITY): Payer: Self-pay | Admitting: *Deleted

## 2015-12-24 DIAGNOSIS — R197 Diarrhea, unspecified: Secondary | ICD-10-CM | POA: Insufficient documentation

## 2015-12-24 DIAGNOSIS — E7800 Pure hypercholesterolemia, unspecified: Secondary | ICD-10-CM | POA: Insufficient documentation

## 2015-12-24 DIAGNOSIS — K5730 Diverticulosis of large intestine without perforation or abscess without bleeding: Secondary | ICD-10-CM | POA: Insufficient documentation

## 2015-12-24 DIAGNOSIS — Q438 Other specified congenital malformations of intestine: Secondary | ICD-10-CM | POA: Diagnosis not present

## 2015-12-24 DIAGNOSIS — F17210 Nicotine dependence, cigarettes, uncomplicated: Secondary | ICD-10-CM | POA: Diagnosis not present

## 2015-12-24 DIAGNOSIS — I509 Heart failure, unspecified: Secondary | ICD-10-CM | POA: Diagnosis not present

## 2015-12-24 DIAGNOSIS — Z7982 Long term (current) use of aspirin: Secondary | ICD-10-CM | POA: Insufficient documentation

## 2015-12-24 DIAGNOSIS — I252 Old myocardial infarction: Secondary | ICD-10-CM | POA: Insufficient documentation

## 2015-12-24 DIAGNOSIS — Z79899 Other long term (current) drug therapy: Secondary | ICD-10-CM | POA: Insufficient documentation

## 2015-12-24 DIAGNOSIS — K648 Other hemorrhoids: Secondary | ICD-10-CM | POA: Diagnosis not present

## 2015-12-24 DIAGNOSIS — I2510 Atherosclerotic heart disease of native coronary artery without angina pectoris: Secondary | ICD-10-CM | POA: Diagnosis not present

## 2015-12-24 DIAGNOSIS — I110 Hypertensive heart disease with heart failure: Secondary | ICD-10-CM | POA: Insufficient documentation

## 2015-12-24 HISTORY — PX: COLONOSCOPY: SHX5424

## 2015-12-24 SURGERY — COLONOSCOPY
Anesthesia: Moderate Sedation

## 2015-12-24 MED ORDER — SODIUM CHLORIDE 0.9 % IV SOLN
INTRAVENOUS | Status: DC
  Administered 2015-12-24: 13:00:00 via INTRAVENOUS

## 2015-12-24 MED ORDER — MIDAZOLAM HCL 5 MG/5ML IJ SOLN
INTRAMUSCULAR | Status: AC
  Filled 2015-12-24: qty 10

## 2015-12-24 MED ORDER — MEPERIDINE HCL 100 MG/ML IJ SOLN
INTRAMUSCULAR | Status: AC
  Filled 2015-12-24: qty 2

## 2015-12-24 MED ORDER — MEPERIDINE HCL 100 MG/ML IJ SOLN
INTRAMUSCULAR | Status: DC | PRN
  Administered 2015-12-24 (×2): 25 mg via INTRAVENOUS

## 2015-12-24 MED ORDER — MIDAZOLAM HCL 5 MG/5ML IJ SOLN
INTRAMUSCULAR | Status: DC | PRN
  Administered 2015-12-24: 2 mg via INTRAVENOUS
  Administered 2015-12-24: 1 mg via INTRAVENOUS

## 2015-12-24 NOTE — Discharge Instructions (Signed)
You DID NOT HAVE ANY POLYPS. You have small internal hemorrhoids and diverticulosis IN YOUR RIGHT AND LEFT COLON.  DRINK WATER TO KEEP YOUR URINE LIGHT YELLOW.  FOLLOW A HIGH FIBER/LOW LACTOSE  DIET. AVOID ITEMS THAT CAUSE BLOATING. SEE INFO BELOW.  TAKE 3 LACTASE PILLS WITH MEALS UP TO THREE TIMES A DAY. IF NEEDED, YOU CAN TRY VIBERZI BUT I WOULD TAKE ONE PILL EVERY OTHER DAY.  YOUR BIOPSY RESULTS WILL BE AVAILABLE IN MY CHART FEB 23 AND MY OFFICE WILL CONTACT YOU IN 10-14 DAYS WITH YOUR RESULTS.   Next colonoscopy in 10-15 years IF THE BENEFITS OUTWEIGH THE RISKS.   Colonoscopy Care After Read the instructions outlined below and refer to this sheet in the next week. These discharge instructions provide you with general information on caring for yourself after you leave the hospital. While your treatment has been planned according to the most current medical practices available, unavoidable complications occasionally occur. If you have any problems or questions after discharge, call DR. FIELDS, [phone].  ACTIVITY  You may resume your regular activity, but move at a slower pace for the next 24 hours.   Take frequent rest periods for the next 24 hours.   Walking will help get rid of the air and reduce the bloated feeling in your belly (abdomen).   No driving for 24 hours (because of the medicine (anesthesia) used during the test).   You may shower.   Do not sign any important legal documents or operate any machinery for 24 hours (because of the anesthesia used during the test).    NUTRITION  Drink plenty of fluids.   You may resume your normal diet as instructed by your doctor.   Begin with a light meal and progress to your normal diet. Heavy or fried foods are harder to digest and may make you feel sick to your stomach (nauseated).   Avoid alcoholic beverages for 24 hours or as instructed.    MEDICATIONS  You may resume your normal medications.   WHAT YOU CAN  EXPECT TODAY  Some feelings of bloating in the abdomen.   Passage of more gas than usual.   Spotting of blood in your stool or on the toilet paper  .  IF YOU HAD POLYPS REMOVED DURING THE COLONOSCOPY:  Eat a soft diet IF YOU HAVE NAUSEA, BLOATING, ABDOMINAL PAIN, OR VOMITING.    FINDING OUT THE RESULTS OF YOUR TEST Not all test results are available during your visit. DR. Darrick Penna WILL CALL YOU WITHIN 7 DAYS OF YOUR PROCEDUE WITH YOUR RESULTS. Do not assume everything is normal if you have not heard from DR. FIELDS IN ONE WEEK, CALL HER OFFICE AT [phone].  SEEK IMMEDIATE MEDICAL ATTENTION AND CALL THE OFFICE: [phone] IF:  You have more than a spotting of blood in your stool.   Your belly is swollen (abdominal distention).   You are nauseated or vomiting.   You have a temperature over 101F.   You have abdominal pain or discomfort that is severe or gets worse throughout the day.    Lactose Free Diet Lactose is a carbohydrate that is found mainly in milk and milk products, as well as in foods with added milk or whey. Lactose must be digested by the enzyme in order to be used by the body. Lactose intolerance occurs when there is a shortage of lactase. When your body is not able to digest lactose, you may feel sick to your stomach (nausea), bloating, cramping, gas  and diarrhea.  There are many dairy products that may be tolerated better than milk by some people:  The use of cultured dairy products such as yogurt, buttermilk, cottage cheese, and sweet acidophilus milk (Kefir) for lactase-deficient individuals is usually well tolerated. This is because the healthy bacteria help digest lactose.   Lactose-hydrolyzed milk (Lactaid) contains 40-90% less lactose than milk and may also be well tolerated.    SPECIAL NOTES  Lactose is a carbohydrates. The major food source is dairy products. Reading food labels is important. Many products contain lactose even when they are not  made from milk. Look for the following words: whey, milk solids, dry milk solids, nonfat dry milk powder. Typical sources of lactose other than dairy products include breads, candies, cold cuts, prepared and processed foods, and commercial sauces and gravies.   All foods must be prepared without milk, cream, or other dairy foods.   Soy milk and lactose-free supplements (LACTASE) may be used as an alternative to milk.   FOOD GROUP ALLOWED/RECOMMENDED AVOID/USE SPARINGLY  BREADS / STARCHES 4 servings or more* Breads and rolls made without milk. [place], [place], or [place] bread. Breads and rolls that contain milk. Prepared mixes such as muffins, biscuits, waffles, pancakes. Sweet rolls, donuts, [place] toast (if made with milk or lactose).  Crackers: Soda crackers, graham crackers. Any crackers prepared without lactose. Zwieback crackers, corn curls, or any that contain lactose.  Cereals: Cooked or dry cereals prepared without lactose (read labels). Cooked or dry cereals prepared with lactose (read labels). Total, Cocoa Krispies. Special K.  Potatoes / Pasta / Rice: Any prepared without milk or lactose. Popcorn. Instant potatoes, frozen [place] fries, scalloped or au gratin potatoes.  VEGETABLES 2 servings or more Fresh, frozen, and canned vegetables. Creamed or breaded vegetables. Vegetables in a cheese sauce or with lactose-containing margarines.  FRUIT 2 servings or more All fresh, canned, or frozen fruits that are not processed with lactose. Any canned or frozen fruits processed with lactose.  MEAT & SUBSTITUTES 2 servings or more (4 to 6 oz. total per day) Plain beef, chicken, fish, [place], lamb, veal, pork, or ham. Kosher prepared meat products. Strained or junior meats that do not contain milk. Eggs, soy meat substitutes, nuts. Scrambled eggs, omelets, and souffles that contain milk. Creamed or breaded meat, fish, or fowl. Sausage products such as wieners, liver sausage, or cold cuts that  contain milk solids. Cheese, cottage cheese, or cheese spreads.  MILK None. (See BEVERAGES for milk substitutes. See DESSERTS for ice cream and frozen desserts.) Milk (whole, 2%, skim, or chocolate). Evaporated, powdered, or condensed milk; malted milk.  SOUPS & COMBINATION FOODS Bouillon, broth, vegetable soups, clear soups, consomms. Homemade soups made with allowed ingredients. Combination or prepared foods that do not contain milk or milk products (read labels). Cream soups, chowders, commercially prepared soups containing lactose. Macaroni and cheese, pizza. Combination or prepared foods that contain milk or milk products.  DESSERTS & SWEETS In moderation Water and fruit ices; gelatin; angel food cake. Homemade cookies, pies, or cakes made from allowed ingredients. Pudding (if made with water or a milk substitute). Lactose-free tofu desserts. Sugar, honey, corn syrup, jam, jelly; marmalade; molasses (beet sugar); Pure sugar candy; marshmallows. Ice cream, ice milk, sherbet, custard, pudding, frozen yogurt. Commercial cake and cookie mixes. Desserts that contain chocolate. Pie crust made with milk-containing margarine; reduced-calorie desserts made with a sugar substitute that contains lactose. Toffee, peppermint, butterscotch, chocolate, caramels.  FATS & OILS In moderation Butter (as tolerated; contains  very small amounts of lactose). Margarines and dressings that do not contain milk, Vegetable oils, shortening, Miracle Whip, mayonnaise, nondairy cream & whipped toppings without lactose or milk solids added (examples: Coffee Rich, Carnation Coffeemate, Rich's Whipped Topping, PolyRich). Tomasa Blase. Margarines and salad dressings containing milk; cream, cream cheese; peanut butter with added milk solids, sour cream, chip dips, made with sour cream.  BEVERAGES Carbonated drinks; tea; coffee and freeze-dried coffee; some instant coffees (check labels). Fruit drinks; fruit and vegetable juice; Rice or  Soy milk. Ovaltine, hot chocolate. Some cocoas; some instant coffees; instant iced teas; powdered fruit drinks (read labels).   CONDIMENTS / MISCELLANEOUS Soy sauce, carob powder, olives, gravy made with water, baker's cocoa, pickles, pure seasonings and spices, wine, pure monosodium glutamate, catsup, mustard. Some chewing gums, chocolate, some cocoas. Certain antibiotics and vitamin / mineral preparations. Spice blends if they contain milk products. MSG extender. Artificial sweeteners that contain lactose such as Equal (Nutra-Sweet) and Sweet 'n Low. Some nondairy creamers (read labels).   SAMPLE MENU*  Breakfast   Orange Juice.  Banana.   Bran flakes.   Nondairy Creamer.  Vienna Bread (toasted).   Butter or milk-free margarine.   Coffee or tea.    Noon Meal   Chicken Breast.  Rice.   Green beans.   Butter or milk-free margarine.  Fresh melon.   Coffee or tea.    Evening Meal   Roast Beef.  Baked potato.   Butter or milk-free margarine.   Broccoli.   Lettuce salad with vinegar and oil dressing.  [employer].   Coffee or tea.        High-Fiber Diet A high-fiber diet changes your normal diet to include more whole grains, legumes, fruits, and vegetables. Changes in the diet involve replacing refined carbohydrates with unrefined foods. The calorie level of the diet is essentially unchanged. The Dietary Reference Intake (recommended amount) for adult males is 38 grams per day. For adult females, it is 25 grams per day. Pregnant and lactating women should consume 28 grams of fiber per day. Fiber is the intact part of a plant that is not broken down during digestion. Functional fiber is fiber that has been isolated from the plant to provide a beneficial effect in the body. PURPOSE  Increase stool bulk.   Ease and regulate bowel movements.   Lower cholesterol.  REDUCE RISK OF COLON CANCER  INDICATIONS THAT YOU NEED MORE FIBER  Constipation and  hemorrhoids.   Uncomplicated diverticulosis (intestine condition) and irritable bowel syndrome.   Weight management.   As a protective measure against hardening of the arteries (atherosclerosis), diabetes, and cancer.   GUIDELINES FOR INCREASING FIBER IN THE DIET  Start adding fiber to the diet slowly. A gradual increase of about 5 more grams (2 slices of whole-wheat bread, 2 servings of most fruits or vegetables, or 1 bowl of high-fiber cereal) per day is best. Too rapid an increase in fiber may result in constipation, flatulence, and bloating.   Drink enough water and fluids to keep your urine clear or pale yellow. Water, juice, or caffeine-free drinks are recommended. Not drinking enough fluid may cause constipation.   Eat a variety of high-fiber foods rather than one type of fiber.   Try to increase your intake of fiber through using high-fiber foods rather than fiber pills or supplements that contain small amounts of fiber.   The goal is to change the types of food eaten. Do not supplement your present diet with high-fiber foods,  but replace foods in your present diet.   INCLUDE A VARIETY OF FIBER SOURCES  Replace refined and processed grains with whole grains, canned fruits with fresh fruits, and incorporate other fiber sources. White rice, white breads, and most bakery goods contain little or no fiber.   Brown whole-grain rice, buckwheat oats, and many fruits and vegetables are all good sources of fiber. These include: broccoli, Brussels sprouts, cabbage, cauliflower, beets, sweet potatoes, white potatoes (skin on), carrots, tomatoes, eggplant, squash, berries, fresh fruits, and dried fruits.   Cereals appear to be the richest source of fiber. Cereal fiber is found in whole grains and bran. Bran is the fiber-rich outer coat of cereal grain, which is largely removed in refining. In whole-grain cereals, the bran remains. In breakfast cereals, the largest amount of fiber is found in  those with "bran" in their names. The fiber content is sometimes indicated on the label.   You may need to include additional fruits and vegetables each day.   In baking, for 1 cup white flour, you may use the following substitutions:   1 cup whole-wheat flour minus 2 tablespoons.   1/2 cup white flour plus 1/2 cup whole-wheat flour.   Diverticulosis Diverticulosis is a common condition that develops when small pouches (diverticula) form in the wall of the colon. The risk of diverticulosis increases with age. It happens more often in people who eat a low-fiber diet. Most individuals with diverticulosis have no symptoms. Those individuals with symptoms usually experience belly (abdominal) pain, constipation, or loose stools (diarrhea).  HOME CARE INSTRUCTIONS  Increase the amount of fiber in your diet as directed by your caregiver or dietician. This may reduce symptoms of diverticulosis.   Drink at least 6 to 8 glasses of water each day to prevent constipation.   Try not to strain when you have a bowel movement.   Avoiding nuts and seeds to prevent complications is NOT NECESSARY.    FOODS HAVING HIGH FIBER CONTENT INCLUDE:  Fruits. Apple, peach, pear, tangerine, raisins, prunes.   Vegetables. Brussels sprouts, asparagus, broccoli, cabbage, carrot, cauliflower, romaine lettuce, spinach, summer squash, tomato, winter squash, zucchini.   Starchy Vegetables. Baked beans, kidney beans, lima beans, split peas, lentils, potatoes (with skin).   Grains. Whole wheat bread, brown rice, bran flake cereal, plain oatmeal, white rice, shredded wheat, bran muffins.   SEEK IMMEDIATE MEDICAL CARE IF:  You develop increasing pain or severe bloating.   You have an oral temperature above 101F.   You develop vomiting or bowel movements that are bloody or black.   Hemorrhoids Hemorrhoids are dilated (enlarged) veins around the rectum. Sometimes clots will form in the veins. This makes them swollen  and painful. These are called thrombosed hemorrhoids. Causes of hemorrhoids include:  Constipation.   Straining to have a bowel movement.   HEAVY LIFTING  HOME CARE INSTRUCTIONS  Eat a well balanced diet and drink 6 to 8 glasses of water every day to avoid constipation. You may also use a bulk laxative.   Avoid straining to have bowel movements.   Keep anal area dry and clean.   Do not use a donut shaped pillow or sit on the toilet for long periods. This increases blood pooling and pain.   Move your bowels when your body has the urge; this will require less straining and will decrease pain and pressure.   PATIENT INSTRUCTIONS POST-ANESTHESIA  IMMEDIATELY FOLLOWING SURGERY:  Do not drive or operate machinery for the first twenty four hours  after surgery.  Do not make any important decisions for twenty four hours after surgery or while taking narcotic pain medications or sedatives.  If you develop intractable nausea and vomiting or a severe headache please notify your doctor immediately.  FOLLOW-UP:  Please make an appointment with your surgeon as instructed. You do not need to follow up with anesthesia unless specifically instructed to do so.  WOUND CARE INSTRUCTIONS (if applicable):  Keep a dry clean dressing on the anesthesia/puncture wound site if there is drainage.  Once the wound has quit draining you may leave it open to air.  Generally you should leave the bandage intact for twenty four hours unless there is drainage.  If the epidural site drains for more than 36-48 hours please call the anesthesia department.  QUESTIONS?:  Please feel free to call your physician or the hospital operator if you have any questions, and they will be happy to assist you.

## 2015-12-24 NOTE — Op Note (Addendum)
[facility] [address] [place], [address]   COLONOSCOPY PROCEDURE REPORT  PATIENT: Nicole Clements, Nicole Clements  MR#: [id number] BIRTHDATE: 12/24/1940 , 74  yrs. old GENDER: female ENDOSCOPIST: West Bali, MD REFERRED NP:[id number] Shaw, M.D. PROCEDURE DATE:  01-15-2016 PROCEDURE:   Colonoscopy with biopsy INDICATIONS:chronic diarrhea and unexplained diarrhea. MEDICATIONS: Demerol 50 mg IV and Versed 3 mg IV MD STARTED SEDATION: 1403 PROCEDURE COMPLETE: 1442  DESCRIPTION OF PROCEDURE:    Physical exam was performed.  Informed consent was obtained from the patient after explaining the benefits, risks, and alternatives to procedure.  The patient was connected to monitor and placed in left lateral position. Continuous oxygen was provided by nasal cannula and IV medicine administered through an indwelling cannula.  After administration of sedation and rectal exam, the patients rectum was intubated and the EC-[id number] ([id number])  colonoscope was advanced under direct visualization to the cecum.  The scope was removed slowly by carefully examining the color, texture, anatomy, and integrity mucosa on the way out.  The patient was recovered in endoscopy and discharged home in satisfactory condition. Estimated blood loss is zero unless otherwise noted in this procedure report.    COLON FINDINGS: The colon was redundant.  Manual abdominal counter-pressure was used to reach the cecum, There was moderate diverticulosis noted throughout the entire examined colon with associated tortuosity and muscular hypertrophy. Cold  forceps biopsies obtained in the right colon. Moderate sized internal hemorrhoids were found.  PREP QUALITY: good.  CECAL W/D TIME: 15       minutes COMPLICATIONS: None  ENDOSCOPIC IMPRESSION: 1.   The LEFT colon IS redundant 2.   Moderate diverticulosis throughout the entire examined colon 3.   Moderate sized internal hemorrhoids  RECOMMENDATIONS: DRINK WATER  TO KEEP URINE LIGHT YELLOW. FOLLOW A HIGH FIBER/LOW LACTOSE DIET. TAKE 3 LACTASE PILLS WITH MEALS UP TO THREE TIMES A DAY.  IF NEEDED, TRY VIBERZI EVERY OTHER DAY. AWAIT BIOPSY RESULTS. Next colonoscopy in 10-15 years IF THE BENEFITS OUTWEIGH THE RISKS.      _______________________________ Rosalie DoctorWest Bali, MD Jan 15, 2016 8:38 PM Revised: 01-15-16 8:38 PM   CPT CODES: ICD CODES:  The ICD and CPT codes recommended by this software are interpretations from the data that the clinical staff has captured with the software.  The verification of the translation of this report to the ICD and CPT codes and modifiers is the sole responsibility of the health care institution and practicing physician where this report was generated.  [employer] will not be held responsible for the validity of the ICD and CPT codes included on this report.  AMA assumes no liability for data contained or not contained herein. CPT is a Publishing rights manager of the [employer].

## 2015-12-24 NOTE — H&P (Signed)
Primary Care Physician:  Lupita Raider, MD Primary Gastroenterologist:  Dr. Darrick Penna  Pre-Procedure History & Physical: HPI:  Nicole Clements is a 75 y.o. female here for  DIARRHEA.  Past Medical History  Diagnosis Date  . STEMI (ST elevation myocardial infarction) (HCC) 07/22/1996  . CAD (coronary artery disease) April 2014    DES to PLA, occluded PDA 07/2014  . Hypertension   . Hypercholesterolemia   . S/P colonoscopy August 2007    Hyperplastic polyps, rare sigmoid and descending colon diverticulosis, small internal hemorrhoids  . CHF (congestive heart failure) (HCC)   . Ischemic cardiomyopathy     LVEF 25%-45%  . PVC's (premature ventricular contractions)   . IVCD (intraventricular conduction defect)     Past Surgical History  Procedure Laterality Date  . Cardiac catheterization  April 2014    Med Rx  . S/p hysterectomy    . Left and right heart catheterization with coronary angiogram N/A 07/17/2014    Procedure: LEFT AND RIGHT HEART CATHETERIZATION WITH CORONARY ANGIOGRAM;  Surgeon: Micheline Chapman, MD;  Location: [facility] CATH LAB;  Service: Cardiovascular;  Laterality: N/A;  . Percutaneous coronary stent intervention (pci-s)  07/17/2014    Procedure: PERCUTANEOUS CORONARY STENT INTERVENTION (PCI-S);  Surgeon: Micheline Chapman, MD;  Location[facility] CATH LAB;  Service: Cardiovascular;;  Distal RCA  . Ep implantable device  04/18/2015    BV ICD  . Ep implantable device N/A 04/18/2015    Procedure: BiV ICD Insertion CRT-D;  Surgeon: Duke Salvia, MD;  Location: [facility] INVASIVE CV LAB;  Service: Cardiovascular;  Laterality: N/A;    Prior to Admission medications   Medication Sig Start Date End Date Taking? Authorizing Provider  acetaminophen (TYLENOL) 325 MG tablet Take 2 tablets (650 mg total) by mouth every 4 (four) hours as needed for headache or mild pain. 07/19/14  Yes Abelino Derrick, PA-C  aspirin 325 MG EC tablet Take 325 mg by mouth daily.   Yes Historical Provider, MD  atorvastatin  (LIPITOR) 80 MG tablet Take 1 tablet (80 mg total) by mouth daily. 11/16/15  Yes Antoine Poche, MD  carvedilol (COREG) 25 MG tablet Take 1 tablet (25 mg total) by mouth 2 (two) times daily. 11/16/15  Yes Antoine Poche, MD  Cholecalciferol (VITAMIN D3) 2000 UNITS capsule Take 2,000 Units by mouth daily.     Yes Historical Provider, MD  Eluxadoline (VIBERZI) 75 MG TABS Take 1 tablet by mouth 2 (two) times daily with a meal. 12/11/15  Yes Nira Retort, NP  furosemide (LASIX) 80 MG tablet Take 1 tablet (80 mg total) by mouth daily. 07/23/15  Yes Luke K Kilroy, PA-C  losartan (COZAAR) 25 MG tablet Take 1 tablet (25 mg total) by mouth daily. 04/06/15  Yes Antoine Poche, MD  Omega-3 Fatty Acids (FISH OIL) 1000 MG CAPS Take 1,000 mg by mouth daily.    Yes Historical Provider, MD  polyethylene glycol-electrolytes (NULYTELY/GOLYTELY) 420 g solution Take 4,000 mLs by mouth once. 12/11/15  Yes Nira Retort, NP  potassium chloride SA (KLOR-CON M20) 20 MEQ tablet Take 1 tablet (20 mEq total) by mouth daily. 12/07/14  Yes Duke Salvia, MD  spironolactone (ALDACTONE) 25 MG tablet Take 0.5 tablets (12.5 mg total) by mouth 2 (two) times daily. 08/21/15  Yes Antoine Poche, MD  nitroGLYCERIN (NITROSTAT) 0.4 MG SL tablet Place 1 tablet (0.4 mg total) under the tongue every 5 (five) minutes x 3 doses as needed for chest pain. 07/19/14  Abelino Derrick, PA-C    Allergies as of 12/11/2015 - Review Complete 12/11/2015  Allergen Reaction Noted  . Lactose intolerance (gi) Other (See Comments) 04/29/2015  . Sulfa antibiotics Rash 07/16/2011    Family History  Problem Relation Age of Onset  . Colon cancer Neg Hx   . Hypertension Sister   . Diabetes Mellitus II Sister   . Hypertension Brother   . Diabetes Mellitus II Brother   . Hypertension Brother   . Diabetes Mellitus II Brother   . Hypertension Brother   . Diabetes Mellitus II Brother     Social History   Social History  . Marital Status: Single     Spouse Name: N/A  . Number of Children: 1  . Years of Education: N/A   Occupational History  . Retired     Clinical biochemist rep   Social History Main Topics  . Smoking status: Current Every Day Smoker -- 0.50 packs/day for 50 years    Types: Cigarettes    Start date: 02/26/1962  . Smokeless tobacco: Never Used     Comment: down to less than 1/2 ppd  . Alcohol Use: No  . Drug Use: No  . Sexual Activity: Not on file   Other Topics Concern  . Not on file   Social History Narrative    Review of Systems: See HPI, otherwise negative ROS   Physical Exam: BP 146/73 mmHg  Pulse 78  Temp(Src) 97.5 F (36.4 C) (Oral)  Resp 16  Ht 5\' 2"  (1.575 m)  Wt 143 lb (64.864 kg)  BMI 26.15 kg/m2  SpO2 100% General:   Alert,  pleasant and cooperative in NAD Head:  Normocephalic and atraumatic. Neck:  Supple; Lungs:  Clear throughout to auscultation.    Heart:  Regular rate and rhythm. Abdomen:  Soft, nontender and nondistended. Normal bowel sounds, without guarding, and without rebound.   Neurologic:  Alert and  oriented x4;  grossly normal neurologically.  Impression/Plan:     Diarrhea  PLAN: TCS TODAY WITH BIOPSY. DISCUSSED PROCEDURE, BENEFITS, & RISKS: < 1% chance of medication reaction, bleeding, perforation, or rupture of spleen/liver.

## 2015-12-27 ENCOUNTER — Encounter (HOSPITAL_COMMUNITY): Payer: Self-pay | Admitting: Gastroenterology

## 2015-12-28 DIAGNOSIS — R197 Diarrhea, unspecified: Secondary | ICD-10-CM | POA: Diagnosis not present

## 2015-12-28 DIAGNOSIS — E0590 Thyrotoxicosis, unspecified without thyrotoxic crisis or storm: Secondary | ICD-10-CM | POA: Diagnosis not present

## 2015-12-28 DIAGNOSIS — N179 Acute kidney failure, unspecified: Secondary | ICD-10-CM | POA: Diagnosis not present

## 2015-12-28 DIAGNOSIS — R634 Abnormal weight loss: Secondary | ICD-10-CM | POA: Diagnosis not present

## 2015-12-28 DIAGNOSIS — I5022 Chronic systolic (congestive) heart failure: Secondary | ICD-10-CM | POA: Diagnosis not present

## 2016-01-01 ENCOUNTER — Encounter: Payer: Self-pay | Admitting: Gastroenterology

## 2016-01-01 NOTE — Progress Notes (Signed)
Quick Note:  Pt is aware. Said she took one or two of the Viberzi, but she is not having diarrhea at this time. Forwarding to Stacy to schedule the OV in 6 weeks. ______

## 2016-01-01 NOTE — Progress Notes (Signed)
APPT MADE AND LETTER SENT  °

## 2016-01-01 NOTE — Progress Notes (Signed)
Quick Note:  Her stool studies are negative. I don't know if she has started Viberzi or not. Let's have her come in for a follow-up in about 6 weeks. ______

## 2016-01-03 DIAGNOSIS — N179 Acute kidney failure, unspecified: Secondary | ICD-10-CM | POA: Diagnosis not present

## 2016-01-03 DIAGNOSIS — E0590 Thyrotoxicosis, unspecified without thyrotoxic crisis or storm: Secondary | ICD-10-CM | POA: Diagnosis not present

## 2016-01-03 DIAGNOSIS — Z5181 Encounter for therapeutic drug level monitoring: Secondary | ICD-10-CM | POA: Diagnosis not present

## 2016-01-03 DIAGNOSIS — Z720 Tobacco use: Secondary | ICD-10-CM | POA: Diagnosis not present

## 2016-01-04 ENCOUNTER — Other Ambulatory Visit: Payer: Self-pay | Admitting: Cardiology

## 2016-01-08 ENCOUNTER — Telehealth: Payer: Self-pay | Admitting: Gastroenterology

## 2016-01-08 NOTE — Telephone Encounter (Signed)
PLEASE CALL PT. Her colon Bx are normal.    DRINK WATER TO KEEP YOUR URINE LIGHT YELLOW.  FOLLOW A HIGH FIBER/LOW LACTOSE  DIET. AVOID ITEMS THAT CAUSE BLOATING.   TAKE 3 LACTASE PILLS WITH MEALS UP TO THREE TIMES A DAY. IF NEEDED, YOU CAN TRY VIBERZI BUT I WOULD TAKE ONE PILL EVERY OTHER DAY.  OPV IN APR 2017 E30 DIARRHEA.  Next colonoscopy in 10-15 years IF THE BENEFITS OUTWEIGH THE RISKS.

## 2016-01-09 NOTE — Telephone Encounter (Signed)
Reminder in epic °

## 2016-01-09 NOTE — Telephone Encounter (Signed)
Pt is aware of results. 

## 2016-01-14 ENCOUNTER — Other Ambulatory Visit: Payer: Self-pay | Admitting: Cardiology

## 2016-01-14 NOTE — Telephone Encounter (Signed)
REFILL 

## 2016-01-17 DIAGNOSIS — N183 Chronic kidney disease, stage 3 (moderate): Secondary | ICD-10-CM | POA: Diagnosis not present

## 2016-01-17 DIAGNOSIS — N179 Acute kidney failure, unspecified: Secondary | ICD-10-CM | POA: Diagnosis not present

## 2016-01-23 ENCOUNTER — Ambulatory Visit: Payer: Medicare Other | Admitting: Endocrinology

## 2016-02-01 ENCOUNTER — Other Ambulatory Visit (HOSPITAL_COMMUNITY): Payer: Self-pay | Admitting: Internal Medicine

## 2016-02-01 DIAGNOSIS — E0590 Thyrotoxicosis, unspecified without thyrotoxic crisis or storm: Secondary | ICD-10-CM

## 2016-02-04 DIAGNOSIS — E0590 Thyrotoxicosis, unspecified without thyrotoxic crisis or storm: Secondary | ICD-10-CM | POA: Diagnosis not present

## 2016-02-05 ENCOUNTER — Other Ambulatory Visit: Payer: Self-pay | Admitting: Internal Medicine

## 2016-02-07 ENCOUNTER — Encounter (HOSPITAL_COMMUNITY)
Admission: RE | Admit: 2016-02-07 | Discharge: 2016-02-07 | Disposition: A | Discharge status: Home / Self Care | Payer: Medicare Other | Source: Ambulatory Visit | Attending: Internal Medicine | Admitting: Internal Medicine

## 2016-02-07 DIAGNOSIS — E0590 Thyrotoxicosis, unspecified without thyrotoxic crisis or storm: Secondary | ICD-10-CM | POA: Insufficient documentation

## 2016-02-07 MED ORDER — SODIUM IODIDE I 131 CAPSULE
12.3000 | Freq: Once | INTRAVENOUS | Status: AC
  Administered 2016-02-07: 12.3 via ORAL

## 2016-02-08 ENCOUNTER — Ambulatory Visit (HOSPITAL_COMMUNITY)
Admission: RE | Admit: 2016-02-08 | Discharge: 2016-02-08 | Disposition: A | Discharge status: Home / Self Care | Payer: Medicare Other | Source: Ambulatory Visit | Attending: Internal Medicine | Admitting: Internal Medicine

## 2016-02-08 DIAGNOSIS — E0590 Thyrotoxicosis, unspecified without thyrotoxic crisis or storm: Secondary | ICD-10-CM | POA: Insufficient documentation

## 2016-02-08 MED ORDER — SODIUM PERTECHNETATE TC 99M INJECTION: 10.0000 | Freq: Once | INTRAVENOUS | Status: DC | PRN

## 2016-02-12 ENCOUNTER — Encounter: Payer: Self-pay | Admitting: Gastroenterology

## 2016-02-12 ENCOUNTER — Ambulatory Visit (INDEPENDENT_AMBULATORY_CARE_PROVIDER_SITE_OTHER): Payer: Medicare Other | Admitting: Gastroenterology

## 2016-02-12 VITALS — BP 131/76 | HR 85 | Temp 97.3°F | Ht 62.0 in | Wt 148.4 lb

## 2016-02-12 DIAGNOSIS — R197 Diarrhea, unspecified: Secondary | ICD-10-CM | POA: Diagnosis not present

## 2016-02-12 DIAGNOSIS — I2510 Atherosclerotic heart disease of native coronary artery without angina pectoris: Secondary | ICD-10-CM | POA: Diagnosis not present

## 2016-02-12 NOTE — Assessment & Plan Note (Signed)
Improved/resolved. Doing well with lactaid tables. Dietary related. Colonoscopy up-to-date. Consider screening in 10-15 years as benefits outweigh the risks. However, patient desires to not pursue further endoscopic evaluation at this time. Discussed holding on any further Viberzi as she is not having significant symptoms that would require this. Return in 6-8 months.

## 2016-02-12 NOTE — Patient Instructions (Signed)
I am glad you are doing better!  Continue the Lactaid tablets as needed.  Call me if anything changes.  We will see you in 6-8 months. Have a great summer!

## 2016-02-12 NOTE — Progress Notes (Signed)
Referring Provider: Lupita Raider, MD Primary Care Physician:  Lupita Raider, MD  Primary GI: Dr. Darrick Penna   Chief Complaint  Patient presents with  . Follow-up    HPI:   Nicole Clements is a 75 y.o. female presenting today with a history of diarrhea, recently undergoing colonoscopy which was negative for microscopic colitis. She was started on Viberzi to take every other day and lactase pills as needed.   Diarrhea much improved. Depends on food intake. If eats a salad, she has diarrhea. Mac n cheese, cereal causes diarrhea. Will take 2-3 lactaid tablets and no diarrhea. Appetite is good. Dr. Sharl Ma (endocrinologist) was seen and will be getting results soon.   Past Medical History  Diagnosis Date  . STEMI (ST elevation myocardial infarction) (HCC) 07/22/1996  . CAD (coronary artery disease) April 2014    DES to PLA, occluded PDA 07/2014  . Hypertension   . Hypercholesterolemia   . S/P colonoscopy August 2007    Hyperplastic polyps, rare sigmoid and descending colon diverticulosis, small internal hemorrhoids  . CHF (congestive heart failure) (HCC)   . Ischemic cardiomyopathy     LVEF 25%-45%  . PVC's (premature ventricular contractions)   . IVCD (intraventricular conduction defect)     Past Surgical History  Procedure Laterality Date  . Cardiac catheterization  April 2014    Med Rx  . S/p hysterectomy    . Left and right heart catheterization with coronary angiogram N/A 07/17/2014    Procedure: LEFT AND RIGHT HEART CATHETERIZATION WITH CORONARY ANGIOGRAM;  Surgeon: Micheline Chapman, MD;  Location: [facility] CATH LAB;  Service: Cardiovascular;  Laterality: N/A;  . Percutaneous coronary stent intervention (pci-s)  07/17/2014    Procedure: PERCUTANEOUS CORONARY STENT INTERVENTION (PCI-S);  Surgeon: Micheline Chapman, MD;  Location: [facility] CATH LAB;  Service: Cardiovascular;;  Distal RCA  . Ep implantable device  04/18/2015    BV ICD  . Ep implantable device N/A 04/18/2015    Procedure: BiV  ICD Insertion CRT-D;  Surgeon: Duke Salvia, MD;  Location[facility] INVASIVE CV LAB;  Service: Cardiovascular;  Laterality: N/A;  . Colonoscopy N/A 12/24/2015    Dr. Fields:moderate sized internal hemorrhoids/moderate diverticulosis, negative microscopic colitis     Current Outpatient Prescriptions  Medication Sig Dispense Refill  . acetaminophen (TYLENOL) 325 MG tablet Take 2 tablets (650 mg total) by mouth every 4 (four) hours as needed for headache or mild pain.    [name] aspirin 325 MG EC tablet Take 325 mg by mouth daily.    [name] atorvastatin (LIPITOR) 80 MG tablet Take 1 tablet (80 mg total) by mouth daily. 90 tablet 3  . carvedilol (COREG) 25 MG tablet Take 1 tablet (25 mg total) by mouth 2 (two) times daily. 60 tablet 6  . Cholecalciferol (VITAMIN D3) 2000 UNITS capsule Take 2,000 Units by mouth daily.      . Eluxadoline (VIBERZI) 75 MG TABS Take 1 tablet by mouth 2 (two) times daily with a meal. 14 tablet 0  . furosemide (LASIX) 80 MG tablet take 1 tablet by mouth once daily 30 tablet 3  . losartan (COZAAR) 25 MG tablet Take 1 tablet (25 mg total) by mouth daily. 90 tablet 3  . nitroGLYCERIN (NITROSTAT) 0.4 MG SL tablet Place 1 tablet (0.4 mg total) under the tongue every 5 (five) minutes x 3 doses as needed for chest pain. 25 tablet 2  . Omega-3 Fatty Acids (FISH OIL) 1000 MG CAPS Take 1,000 mg by mouth daily.     [name]  potassium chloride SA (K-DUR,KLOR-CON) 20 MEQ tablet take 1 tablet by mouth once daily 90 tablet 2  . spironolactone (ALDACTONE) 25 MG tablet take 1/2 tablet by mouth twice a day 30 tablet 3   No current facility-administered medications for this visit.   Facility-Administered Medications Ordered in Other Visits  Medication Dose Route Frequency Provider Last Rate Last Dose  . sodium pertechnetate (66mTc04) injection 10 milli Curie  10 milli Curie Intravenous Once PRN Talmage Coin, MD        Allergies as of 02/12/2016 - Review Complete 02/12/2016  Allergen Reaction Noted  .  Lactose intolerance (gi) Other (See Comments) 04/29/2015  . Sulfa antibiotics Rash 07/16/2011    Family History  Problem Relation Age of Onset  . Colon cancer Neg Hx   . Hypertension Sister   . Diabetes Mellitus II Sister   . Hypertension Brother   . Diabetes Mellitus II Brother   . Hypertension Brother   . Diabetes Mellitus II Brother   . Hypertension Brother   . Diabetes Mellitus II Brother     Social History   Social History  . Marital Status: Single    Spouse Name: N/A  . Number of Children: 1  . Years of Education: N/A   Occupational History  . Retired     Clinical biochemist rep   Social History Main Topics  . Smoking status: Current Every Day Smoker -- 0.50 packs/day for 50 years    Types: Cigarettes    Start date: 02/26/1962  . Smokeless tobacco: Never Used     Comment: down to less than 1/2 ppd  . Alcohol Use: No  . Drug Use: No  . Sexual Activity: Not Asked   Other Topics Concern  . None   Social History Narrative    Review of Systems: Negative unless mentioned in HPI.   Physical Exam: BP 131/76 mmHg  Pulse 85  Temp(Src) 97.3 F (36.3 C)  Ht  (1.575 m)  Wt 148 lb 6.4 oz (67.314 kg)  BMI 27.14 kg/m2 General:   Alert and oriented. No distress noted. Pleasant and cooperative.  Head:  Normocephalic and atraumatic. Eyes:  Conjuctiva clear without scleral icterus. Abdomen:  +BS, soft, non-tender and non-distended. No rebound or guarding. No HSM or masses noted. Msk:  Symmetrical without gross deformities. Normal posture. Extremities:  Without edema. Neurologic:  Alert and  oriented x4;  grossly normal neurologically. Psych:  Alert and cooperative. Normal mood and affect.

## 2016-02-12 NOTE — Progress Notes (Signed)
cc'ed to pcp °

## 2016-04-08 DIAGNOSIS — E0590 Thyrotoxicosis, unspecified without thyrotoxic crisis or storm: Secondary | ICD-10-CM | POA: Diagnosis not present

## 2016-04-08 DIAGNOSIS — E063 Autoimmune thyroiditis: Secondary | ICD-10-CM | POA: Diagnosis not present

## 2016-04-10 ENCOUNTER — Other Ambulatory Visit: Payer: Self-pay | Admitting: Cardiology

## 2016-04-21 ENCOUNTER — Encounter: Payer: Self-pay | Admitting: Cardiology

## 2016-04-21 ENCOUNTER — Ambulatory Visit (INDEPENDENT_AMBULATORY_CARE_PROVIDER_SITE_OTHER): Payer: Medicare Other | Admitting: Cardiology

## 2016-04-21 VITALS — BP 120/66 | HR 99 | Ht 62.0 in | Wt 147.0 lb

## 2016-04-21 DIAGNOSIS — I5022 Chronic systolic (congestive) heart failure: Secondary | ICD-10-CM | POA: Diagnosis not present

## 2016-04-21 DIAGNOSIS — E785 Hyperlipidemia, unspecified: Secondary | ICD-10-CM | POA: Diagnosis not present

## 2016-04-21 DIAGNOSIS — I340 Nonrheumatic mitral (valve) insufficiency: Secondary | ICD-10-CM | POA: Diagnosis not present

## 2016-04-21 DIAGNOSIS — I2510 Atherosclerotic heart disease of native coronary artery without angina pectoris: Secondary | ICD-10-CM

## 2016-04-21 NOTE — Progress Notes (Signed)
Clinical Summary Nicole Clements is a 75 y.o.female seen today for follow up of the following medical problems.   1. 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 Admit 07/2014 with CHF and troponin elevation - cath 07/2014, LM 30% distal, LAD mid 50%, LCX ostial 50% and mid 50%, small intermediate branch 70%. RCA diffuse 50%, distal RCA with ulcerated 90% lesion, PDA 80-90% too small for PCI. LVEF 30%. Received DES to distal RCA, post procedure the PDA became occluded.  - echo 07/2014 LVEF 20%, inferior akinesis, grade II diastolic dysfunction. Repeat echo 10/2014 LVEF remains 15-20%.  - 04/2015 CRT-D device placed by Dr Graciela Husbands 10/2015 echo LVEF 25-30%   - denies any chest pain, No SOB or DOE. No recent LE edema - limiting sodium intake. Avoiding NSAIDS. - compliant with meds. She is taking lasix 40 every other day. Home weights stable.     2. Mitral regurgitation - mild by most recent echo - denies any significant SOB or LE edema  3. Hyperlipidemia - compliant with pravastatin  daily - 04/2015 TC 138 TG 117 HDL 32 LDL 83  4. CVA - admit 04/2015 with CVA - ICD evaluated, no evidence of afib or aflutter - no recurrent symptoms     SH: just got back from [place], where her daughter lives.    Past Medical History  Diagnosis Date  . STEMI (ST elevation myocardial infarction) (HCC) 07/22/1996  . CAD (coronary artery disease) April 2014    DES to PLA, occluded PDA 07/2014  . Hypertension   . Hypercholesterolemia   . S/P colonoscopy August 2007    Hyperplastic polyps, rare sigmoid and descending colon diverticulosis, small internal hemorrhoids  . CHF (congestive heart failure) (HCC)   . Ischemic cardiomyopathy     LVEF 25%-45%  . PVC's (premature ventricular contractions)   . IVCD (intraventricular conduction defect)      Allergies  Allergen Reactions  . Lactose Intolerance (Gi) Other (See Comments)    GI Upset   . Sulfa Antibiotics Rash     Current  Outpatient Prescriptions  Medication Sig Dispense Refill  . acetaminophen (TYLENOL) 325 MG tablet Take 2 tablets (650 mg total) by mouth every 4 (four) hours as needed for headache or mild pain.    [name] aspirin 325 MG EC tablet Take 325 mg by mouth daily.    [name] atorvastatin (LIPITOR) 80 MG tablet Take 1 tablet (80 mg total) by mouth daily. 90 tablet 3  . carvedilol (COREG) 25 MG tablet Take 1 tablet (25 mg total) by mouth 2 (two) times daily. 60 tablet 6  . Cholecalciferol (VITAMIN D3) 2000 UNITS capsule Take 2,000 Units by mouth daily.      . furosemide (LASIX) 80 MG tablet take 1 tablet by mouth once daily 30 tablet 3  . losartan (COZAAR) 25 MG tablet take 1 tablet by mouth once daily 90 tablet 0  . nitroGLYCERIN (NITROSTAT) 0.4 MG SL tablet Place 1 tablet (0.4 mg total) under the tongue every 5 (five) minutes x 3 doses as needed for chest pain. 25 tablet 2  . Omega-3 Fatty Acids (FISH OIL) 1000 MG CAPS Take 1,000 mg by mouth daily.     . potassium chloride SA (K-DUR,KLOR-CON) 20 MEQ tablet take 1 tablet by mouth once daily 90 tablet 2  . spironolactone (ALDACTONE) 25 MG tablet take 1/2 tablet by mouth twice a day 30 tablet 3   No current facility-administered medications for this visit.  Past Surgical History  Procedure Laterality Date  . Cardiac catheterization  April 2014    Med Rx  . S/p hysterectomy    . Left and right heart catheterization with coronary angiogram N/A 07/17/2014    Procedure: LEFT AND RIGHT HEART CATHETERIZATION WITH CORONARY ANGIOGRAM;  Surgeon: Micheline Chapman, MD;  Location: [facility] CATH LAB;  Service: Cardiovascular;  Laterality: N/A;  . Percutaneous coronary stent intervention (pci-s)  07/17/2014    Procedure: PERCUTANEOUS CORONARY STENT INTERVENTION (PCI-S);  Surgeon: Micheline Chapman, MD;  Location: [facility] CATH LAB;  Service: Cardiovascular;;  Distal RCA  . Ep implantable device  04/18/2015    BV ICD  . Ep implantable device N/A 04/18/2015    Procedure: BiV ICD  Insertion CRT-D;  Surgeon: Duke Salvia, MD;  Location: [facility] INVASIVE CV LAB;  Service: Cardiovascular;  Laterality: N/A;  . Colonoscopy N/A 12/24/2015    Dr. Fields:moderate sized internal hemorrhoids/moderate diverticulosis, negative microscopic colitis      Allergies  Allergen Reactions  . Lactose Intolerance (Gi) Other (See Comments)    GI Upset   . Sulfa Antibiotics Rash      Family History  Problem Relation Age of Onset  . Colon cancer Neg Hx   . Hypertension Sister   . Diabetes Mellitus II Sister   . Hypertension Brother   . Diabetes Mellitus II Brother   . Hypertension Brother   . Diabetes Mellitus II Brother   . Hypertension Brother   . Diabetes Mellitus II Brother      Social History Nicole Clements reports that she has been smoking Cigarettes.  She started smoking about 54 years ago. She has a 25 pack-year smoking history. She has never used smokeless tobacco. Nicole Clements reports that she does not drink alcohol.   Review of Systems CONSTITUTIONAL: No weight loss, fever, chills, weakness or fatigue.  HEENT: Eyes: No visual loss, blurred vision, double vision or yellow sclerae.No hearing loss, sneezing, congestion, runny nose or sore throat.  SKIN: No rash or itching.  CARDIOVASCULAR: per HPI RESPIRATORY: No shortness of breath, cough or sputum.  GASTROINTESTINAL: No anorexia, nausea, vomiting or diarrhea. No abdominal pain or blood.  GENITOURINARY: No burning on urination, no polyuria NEUROLOGICAL: No headache, dizziness, syncope, paralysis, ataxia, numbness or tingling in the extremities. No change in bowel or bladder control.  MUSCULOSKELETAL: No muscle, back pain, joint pain or stiffness.  LYMPHATICS: No enlarged nodes. No history of splenectomy.  PSYCHIATRIC: No history of depression or anxiety.  ENDOCRINOLOGIC: No reports of sweating, cold or heat intolerance. No polyuria or polydipsia.  [name]   Physical Examination Filed Vitals:   04/21/16 1429  BP: 120/66    Pulse: 99   Filed Vitals:   04/21/16 1429  Height: 5\' 2"  (1.575 m)  Weight: 147 lb (66.679 kg)    Gen: resting comfortably, no acute distress HEENT: no scleral icterus, pupils equal round and reactive, no palptable cervical adenopathy,  CV: RRR, no m/r/g, no jvd Resp: Clear to auscultation bilaterally GI: abdomen is soft, non-tender, non-distended, normal bowel sounds, no hepatosplenomegaly MSK: extremities are warm, no edema.  Skin: warm, no rash Neuro:  no focal deficits Psych: appropriate affect   Diagnostic Studies 02/2013 Cath  Procedural Findings:  Hemodynamics:  AO 116/45 with a mean of 70  LV 120/18  Coronary angiography:  Coronary dominance: right  Left mainstem: The left main is patent with 30% distal left main stenosis as the vessel trifurcates into the LAD, intermediate branch, and left circumflex.  Left anterior descending (LAD): The LAD is patent with diffuse disease noted. There is minimal calcification present. The first diagonal is patent with 30-50% diffuse disease. The LAD after the first septal perforator has 40-50% stenosis. The vessel reaches the apex and wraps around the left ventricular apex without significant stenosis.  Left circumflex (LCx): The intermediate branch is small in caliber and diffusely diseased with 50-60% proximal vessel stenosis the AV groove circumflex is diffusely diseased with 50% ostial stenosis and 30% mid stenosis it supplies 2 small posterolateral branch  Right coronary artery (RCA): The RCA is diffusely diseased. There is significant tortuosity, especially around the junction of the mid and distal vessel. The vessel is diffusely calcified. The mid vessel has tandem 50% stenoses. The distal vessel has 80-90 % stenosis just before the bifurcation of the PDA and posterior AV segment. The PDA and posterior AV segment with 3 posterolateral branches are patent.  Left ventriculography: Left ventricular systolic function is  moderately depressed. There is severe hypokinesis of the basal and midinferior walls. The anterolateral and apical walls contract normally. The left ventricular ejection fraction is estimated at 45%.  Abdominal aortogram: The abdominal aorta is patent. The renal arteries are single bilaterally and they are widely patent. There appears to be nonobstructive stenosis of the right common iliac artery at the aortoiliac junction. The left common iliac artery appears to have significant ostial stenosis estimated at at least 75% angiographically. The visualized portions of the external iliac and common femoral arteries are patent  Final Conclusions:  1. Severe single-vessel coronary artery disease involving the distal right coronary artery  2. Moderate LV dysfunction with segmental severe hypokinesis of the inferior wall and normal contraction of the antero-apex with estimated left ventricular ejection fraction of 45%.  3. Severe left common iliac artery stenosis  Recommendations: The patient has severe distal RCA disease. She has a known history of inferior wall infarction and remote PCA. This fits with that clinical history. She has nonobstructive disease of the left coronary artery. Her left ventricular ejection fraction by ventriculography is clearly greater than 35%. With her history of previous MI, I do not think PCI is indicated with an absence of angina. Will carefully discussed symptoms with the patient, but I am inclined to treat her medically. Will also review indication for consideration of peripheral intervention for her iliac disease.    01/2013 Echo  Study Conclusions  - Left ventricle: The cavity size was mildly to moderately dilated. Wall thickness was normal. Systolic function was severely reduced. The estimated ejection fraction was 25%. Severe diffuse hypokinesis. - Aortic valve: Mildly calcified annulus. Trileaflet. - Mitral valve: Calcified annulus. - Left atrium: The atrium was  moderately dilated. - Right ventricle: The cavity size was normal. Wall thickness was mildly to moderately increased. - Right atrium: The atrium was mildly dilated. - Atrial septum: No defect or patent foramen ovale was identified. Impressions:  - Compared to the prior study of 03/13/11, there has been substantial deterioration in LV systolic function.   07/2014 Echo Study Conclusions  - Left ventricle: The cavity size was severely dilated. Wall thickness was normal. The estimated ejection fraction was 20%. Diffuse hypokinesis. There is akinesis of the inferolateral and inferior myocardium. Features are consistent with a pseudonormal left ventricular filling pattern, with concomitant abnormal relaxation and increased filling pressure (grade 2 diastolic dysfunction). Doppler parameters are consistent with high ventricular filling pressure. - Mitral valve: There was mild to moderate regurgitation. - Left atrium: The atrium was moderately  dilated.  Impressions:  - Global hypokinesis; inferior and inferolateral akinesis; overall severely reduced LV function; grade 2 diastolic dysfunction with elevated left ventricular filling pressures, moderate LAE; mild to moderate MR.  07/2014 Cath PROCEDURAL FINDINGS  Hemodynamics:  AO 122/48  LV 118/14  RA 9  RV 41/11  PA 37/5 with a mean of 21  PCWP 27  Oxygen saturations  AO 90  PA 56  Cardiac output 3.5  Cardiac index 2.0  Coronary angiography:  Coronary dominance: right  Left mainstem: The left mainstem is patent with 30% distal left main stenosis.  Left anterior descending (LAD): The LAD is diffusely diseased in the midportion. There are sequential 50% stenoses, unchanged from previous study. The first diagonal branch is large in distribution with diffuse nonobstructive disease noted. The mid and distal LAD are patent without significant disease.  Left circumflex (LCx): The left circumflex has 50% ostial  stenosis, 50% mid vessel stenosis, and to OM branches with no significant disease. There is an intermediate branch is diffusely diseased with up to 70% stenosis. This vessel is small in caliber.  Right coronary artery (RCA): Dominant vessel. Tortuous with diffuse nonobstructive stenosis throughout the proximal and mid portions. There are diffuse 50% stenosis present. The distal vessel has an ulcerated-appearing irregular 90% stenosis involving the bifurcation of the PDA and posterolateral branches. The posterolateral branches are patent with mild diffuse disease. The PDA arises an acute angulation with 30-40% stenosis. The mid body of the PDA has an 80-90% stenosis in an area where the vessel is too small for PCI.  Left ventriculography: The ventricle appears the synchronous. The basal and midinferior wall are akinetic. The basal inferolateral wall is severely hypokinetic. The anterolateral wall and apex contract normally. The estimated LVEF is 30%.  PCI Procedure Note: Following the diagnostic procedure, the decision was made to proceed with PCI of the distal RCA. The lesion is complex, there is a large amount of myocardium involved. I felt there was a risk of potentially compromising the PDA branch, but overall in my opinion the risk/benefit was favorable for PCI considering the severe stenosis involving the entire RCA distribution. The sheath was upsized to a 6 [place]. Weight-based bivalirudin was given for anticoagulation. The patient was loaded with Plavix 600 mg on the table. Once a therapeutic ACT was achieved, a 6 [place] JR 4 guide catheter was inserted. A cougar coronary guidewire was used to cross the lesion into the PLA branch. The lesion was predilated with a 2.5 x 15 mm balloon. The lesion was then stented with a 3.0 by 16mm Promus DES stent. The stent was postdilated with a 3.25 mm noncompliant balloon to 18 atmospheres. The PDA was stented across as the stent extended from the distal RCA  proximal PLA branch. The PDA was totally occluded. I made a long attempt with a whisper wire using multiple bands on the tip of the wire, but all of these attempts were unsuccessful at rewiring the PDA. The patient was completely asymptomatic and specifically denied any chest pain. Her hemodynamics remained stable and there were no changes in her rhythm or blood pressure. A final image of the left coronary artery was obtained and this demonstrated a collateral from the LAD to the PDA branch. Following PCI, there was 0% residual stenosis and TIMI-3 flow. Final angiography confirmed an excellent result. Femoral hemostasis was achieved will be achieved with manual hemostasis. The patient tolerated the PCI procedure well. There were no immediate procedural complications. The patient was transferred  to the post catheterization recovery area for further monitoring.  PCI Data:  Vessel - RCA/Segment - distal  Percent Stenosis (pre) 90  TIMI-flow 3  Stent 3.0 by 77mm Promus DES  Percent Stenosis (post) 0  TIMI-flow (post) 3  Contrast: 175 cc Omnipaque  Radiation dose/Fluoro time: 15.5 minutes  Estimated Blood Loss: Minimal  Final Conclusions:  1. Nonobstructive disease involving the LAD, left circumflex, and OM/diagonal side branches  2. Severe stenosis of the distal RCA bifurcation, treated successfully with a drug-eluting stent into the PLA branch with residual occlusion of the PDA branch  3. Severe segmental LV systolic dysfunction  Recommendations: Dual antiplatelet therapy with aspirin and Plavix for at least 12 months. Will cycle cardiac enzymes since the PDA branch is occluded after stenting.  10/2014 Echo Study Conclusions  - Procedure narrative: Transthoracic echocardiography. Image quality was suboptimal. The study was technically difficult, as a result of poor sound wave transmission and body habitus. - Left ventricle: Systolic function is severely reduced, estimated EF  15-20%. The cavity size was severely dilated. Doppler parameters are consistent with abnormal left ventricular relaxation (grade 1 diastolic dysfunction). Doppler parameters are consistent with high ventricular filling pressure. Medial E/e&' 24. - Regional wall motion abnormality: Akinesis of the basal-mid inferior and basal inferolateral myocardium; severe hypokinesis of the apical inferior and mid inferolateral myocardium. Severe, diffuse hypokinesis. - Ventricular septum: Septal motion showed abnormal function and dyssynergy. These changes are consistent with intraventricular conduction delay. - Aortic valve: Mildly calcified annulus. Trileaflet; mildly thickened leaflets. - Mitral valve: Mildly dilated annulus. Mildly thickened leaflets . There was moderate eccentric regurgitation. 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 - Left atrium: The atrium was severely dilated. Volume/bsa, S: 47.1 ml/m^2. - Right ventricle: Systolic function was mildly reduced. - Right atrium: The atrium was mildly dilated. - Atrial septum: There was increased thickness of the septum, consistent with lipomatous hypertrophy. - Tricuspid valve: There was mild regurgitation. - Pulmonary arteries: PA peak pressure: 42 mm Hg (S). Mildly elevated pulmonary pressures. - Pericardium, extracardiac: There was a small pericardial effusion, with no evidence of tamponade physiology.  04/2015 Carotid US IMPRESSION: 1. Mild (1-49%) stenosis of the proximal right internal carotid artery secondary to smooth heterogeneous atherosclerotic plaque. No interval change compared to recent prior imaging from earlier this month. 2. Mild (1-49%) stenosis of the proximal left internal carotid artery secondary to heterogeneous and irregular atherosclerotic plaque. No interval change compared to recent prior imaging from earlier this month. 3. Vertebral arteries remain patent with normal  antegrade flow. Signed,    10/2015 echo Study Conclusions  - Left ventricle: The cavity size was moderately to severely dilated. Wall thickness was normal. Systolic function was severely reduced. The estimated ejection fraction was in the range of 25% to 30%. Diffuse hypokinesis. There is akinesis of the basalanteroseptal and anterior myocardium. Doppler parameters are consistent with abnormal left ventricular relaxation (grade 1 diastolic dysfunction). - Mitral valve: Calcified annulus. Mildly calcified leaflets . There was mild regurgitation. - Right atrium: Central venous pressure (est): 3 mm Hg. - Atrial septum: No defect or patent foramen ovale was identified. - Tricuspid valve: There was trivial regurgitation. - Pulmonary arteries: PA peak pressure: 27 mm Hg (S). - Pericardium, extracardiac: A prominent pericardial fat pad was present.  Impressions:  - Moderate to severe LV chamber dilatation with LVEF approximately 25-30%. There is diffuse hypokinesis with near akinesis of the basal anteroseptal and anterior myocardium. Compared to the previous study from June 2016 there has  been some improvement in LVEF. Grade 1 diastolic dysfunction. MAC with mild mitral regurgitation. Trivial tricuspid regurgitation with PASP 27 mmHg.  Transthoracic echocardiography. M-mode, complete 2D, spectral Doppler, and color Doppler. Birthdate: Patient birthdate: 1941-06-28. Age: Patient is 75 yr old. Sex: Gender: female. BMI: 27.5 kg/m^2. Blood pressure:   98/47 Patient status: Inpatient. Study date: Study date: 10/04/2015. Study time: 11:27 AM. Location: Echo laboratory.       Assessment and Plan  1. CAD - no current symptoms - she is on high dose ASA due to her history of CVA - continue current meds  2. Chronic systolic HF - no current symptoms. We discussed changing losartan to entresto however she is not interested at this time -  continue current meds  3. Mitral regurgitation Mild by last echo, we will contineu to monitor  4. Hyperlipidemia - continue high dose statin, request labs from pcp.   5. CVA - continue to follow with neuro   F/u 6 months      Antoine Poche, M.D., F.A.C.C.

## 2016-04-21 NOTE — Patient Instructions (Signed)
Medication Instructions:  Your physician recommends that you continue on your current medications as directed. Please refer to the Current Medication list given to you today.   Labwork: I WILL REQUEST RECENT LAB WORK FROM PCP.   Testing/Procedures: NONE  Follow-Up: Your physician wants you to follow-up in: 6 MONTHS.  You will receive a reminder letter in the mail two months in advance. If you don't receive a letter, please call our office to schedule the follow-up appointment.   Any Other Special Instructions Will Be Listed Below (If Applicable).     If you need a refill on your cardiac medications before your next appointment, please call your pharmacy.

## 2016-05-09 ENCOUNTER — Other Ambulatory Visit: Payer: Self-pay | Admitting: Cardiology

## 2016-05-15 ENCOUNTER — Encounter: Payer: Self-pay | Admitting: Internal Medicine

## 2016-05-15 ENCOUNTER — Ambulatory Visit (INDEPENDENT_AMBULATORY_CARE_PROVIDER_SITE_OTHER): Payer: Medicare Other | Admitting: Internal Medicine

## 2016-05-15 VITALS — BP 140/68 | HR 84 | Ht 62.0 in | Wt 147.8 lb

## 2016-05-15 DIAGNOSIS — I5022 Chronic systolic (congestive) heart failure: Secondary | ICD-10-CM

## 2016-05-15 DIAGNOSIS — I255 Ischemic cardiomyopathy: Secondary | ICD-10-CM | POA: Diagnosis not present

## 2016-05-15 DIAGNOSIS — I472 Ventricular tachycardia: Secondary | ICD-10-CM | POA: Diagnosis not present

## 2016-05-15 DIAGNOSIS — Z95810 Presence of automatic (implantable) cardiac defibrillator: Secondary | ICD-10-CM

## 2016-05-15 DIAGNOSIS — I4729 Other ventricular tachycardia: Secondary | ICD-10-CM

## 2016-05-15 DIAGNOSIS — R001 Bradycardia, unspecified: Secondary | ICD-10-CM

## 2016-05-15 DIAGNOSIS — I447 Left bundle-branch block, unspecified: Secondary | ICD-10-CM | POA: Diagnosis not present

## 2016-05-15 MED ORDER — ASPIRIN EC 81 MG PO TBEC: 81.0000 mg | DELAYED_RELEASE_TABLET | Freq: Every day | ORAL | Status: DC

## 2016-05-15 NOTE — Progress Notes (Signed)
Patient referred by Dr Berneice Gandy, Device RN.  Met patient in office and she agreed to monthly ICM calls.  1st remote ICM transmission scheduled for 05/29/2016.

## 2016-05-15 NOTE — Patient Instructions (Addendum)
Medication Instructions:  - DECREASE Aspirin to 81 mg daily  Labwork: - None ordered  Testing/Procedures: - None ordered  Follow-Up: - Remote monitoring is used to monitor your Pacemaker of ICD from home. This monitoring reduces the number of office visits required to check your device to one time per year. It allows [place] to keep an eye on the functioning of your device to ensure it is working properly. You are scheduled for a device check from home on 08/14/2016. You may send your transmission at any time that day. If you have a wireless device, the transmission will be sent automatically. After your physician reviews your transmission, you will receive a postcard with your next transmission date.  - Your physician wants you to follow-up in: 1 year with Francis Dowse, PA.  You will receive a reminder letter in the mail two months in advance. If you don't receive a letter, please call our office to schedule the follow-up appointment.   If you need a refill on your cardiac medications before your next appointment, please call your pharmacy.  Thank you for choosing [facility]!!

## 2016-05-15 NOTE — Progress Notes (Signed)
Patient Care Team: Lupita Raider, MD as PCP - General (Family Medicine) Gretta Cool, MD (Obstetrics and Gynecology) Darden Amber, OD as Referring Physician (Optometry) West Bali, MD (Gastroenterology) Gaylord Shih, MD as Consulting Physician (Cardiology)   HPI  Nicole Clements is a 75 y.o. female Seen in follow-up for CRT ICD implanted for primary prevention 6/16 for left ventricular dysfunction. She has a history of ischemic heart disease with prior stenting of the RCA and RPDA.  Ejection fraction most recently 12/15 was 15-20%. Catheterization 9/15 demonstrated no obstructive disease and LV EF of about 30%.  Hospitalization 2 week after CRT-D implantation with an acute stroke.  She has history of frequent ventricular ectopy. Prior to proceeding with device implantation during evaluation in February, we undertook a Holter monitor which demonstrated 10% PVCs and we began  Antiarrhythmic trial with amiodarone with a significant reduction in PVCs--3%. She had a subsequent echo 3/16 demonstrating no interval improvement.  112/16 Echo EF 25-30%    She denies exercise intolerance. She has no nocturnal dyspnea. She has no peripheral edema. She's had no syncope.  Past Medical History  Diagnosis Date  . STEMI (ST elevation myocardial infarction) (HCC) 07/22/1996  . CAD (coronary artery disease) April 2014    DES to PLA, occluded PDA 07/2014  . Hypertension   . Hypercholesterolemia   . S/P colonoscopy August 2007    Hyperplastic polyps, rare sigmoid and descending colon diverticulosis, small internal hemorrhoids  . CHF (congestive heart failure) (HCC)   . Ischemic cardiomyopathy     LVEF 25%-45%  . PVC's (premature ventricular contractions)   . IVCD (intraventricular conduction defect)     Past Surgical History  Procedure Laterality Date  . Cardiac catheterization  April 2014    Med Rx  . S/p hysterectomy    . Left and right heart catheterization with coronary  angiogram N/A 07/17/2014    Procedure: LEFT AND RIGHT HEART CATHETERIZATION WITH CORONARY ANGIOGRAM;  Surgeon: Micheline Chapman, MD;  Location: [facility] CATH LAB;  Service: Cardiovascular;  Laterality: N/A;  . Percutaneous coronary stent intervention (pci-s)  07/17/2014    Procedure: PERCUTANEOUS CORONARY STENT INTERVENTION (PCI-S);  Surgeon: Micheline Chapman, MD;  Location: [facility] CATH LAB;  Service: Cardiovascular;;  Distal RCA  . Ep implantable device  04/18/2015    BV ICD  . Ep implantable device N/A 04/18/2015    Procedure: BiV ICD Insertion CRT-D;  Surgeon: Duke Salvia, MD;  Location: [facility] INVASIVE CV LAB;  Service: Cardiovascular;  Laterality: N/A;  . Colonoscopy N/A 12/24/2015    Dr. Fields:moderate sized internal hemorrhoids/moderate diverticulosis, negative microscopic colitis     Current Outpatient Prescriptions  Medication Sig Dispense Refill  . acetaminophen (TYLENOL) 325 MG tablet Take 2 tablets (650 mg total) by mouth every 4 (four) hours as needed for headache or mild pain.    [name] aspirin 325 MG EC tablet Take 325 mg by mouth daily.    [name] atorvastatin (LIPITOR) 80 MG tablet Take 1 tablet (80 mg total) by mouth daily. 90 tablet 3  . carvedilol (COREG) 25 MG tablet Take 1 tablet (25 mg total) by mouth 2 (two) times daily. 60 tablet 6  . Cholecalciferol (VITAMIN D3) 2000 UNITS capsule Take 2,000 Units by mouth daily.      . furosemide (LASIX) 80 MG tablet take 1 tablet by mouth once daily 30 tablet 3  . losartan (COZAAR) 25 MG tablet take 1 tablet by mouth once daily 90  tablet 0  . nitroGLYCERIN (NITROSTAT) 0.4 MG SL tablet Place 1 tablet (0.4 mg total) under the tongue every 5 (five) minutes x 3 doses as needed for chest pain. 25 tablet 2  . Omega-3 Fatty Acids (FISH OIL) 1000 MG CAPS Take 1,000 mg by mouth daily.     . potassium chloride SA (K-DUR,KLOR-CON) 20 MEQ tablet take 1 tablet by mouth once daily 90 tablet 2  . spironolactone (ALDACTONE) 25 MG tablet Take 0.5 tablet by mouth twice  daily     No current facility-administered medications for this visit.    Allergies  Allergen Reactions  . Lactose Intolerance (Gi) Other (See Comments)    GI Upset   . Sulfa Antibiotics Rash    Review of Systems negative except from HPI and PMH  Physical Exam BP 140/68 mmHg  Pulse 84  Ht  (1.575 m)  Wt 147 lb 12.8 oz (67.042 kg)  BMI 27.03 kg/m2  SpO2 93% Well developed and well nourished in no acute distress HENT normal E scleral and icterus clear Neck Supple JVP flat; carotids brisk and full Clear to ausculation Device pocket well healed; without hematoma or erythema.  There is no tethering   Regular rate and rhythm, no murmurs gallops or rub Soft with active bowel sounds No clubbing cyanosis  Edema Alert and oriented, grossly normal motor and sensory function Skin Warm and Dry  Electrocardiogram was ordered today AVpacing 84 Intervals 15/13/43 QRS upright in lead V1 with an S wave initially in lead 1  .   Ischemic//nonischemic Cardiomyopathy  CHF chronic Systolic  Ventricular Ectopy//PVCs and VT-Nonsustained  Left bundle branch block  Sinus bradycardia  Optiovol elevation  ICD-CRT Medtronic     She is much improved. We'll continue her on her current medications. Her blood pressure might allow the use of losartan and/or Entresto. I'll defer these decisions to Dr. Wyline Mood.  Decrease ASA 325>>81  Euvolemic continue current meds but optiovol markedly elevated and protractedly so--will have her resume her original 80 instead of 40

## 2016-05-16 LAB — CUP PACEART INCLINIC DEVICE CHECK
Battery Voltage: 3 V
Brady Statistic AP VS Percent: 1.88 %
Brady Statistic AS VS Percent: 0.2 %
Brady Statistic RA Percent Paced: 95.99 %
Brady Statistic RV Percent Paced: 15.6 %
HighPow Impedance: 56 Ohm
Implantable Lead Implant Date: 20160615
Lead Channel Impedance Value: 399 Ohm
Lead Channel Impedance Value: 399 Ohm
Lead Channel Impedance Value: 418 Ohm
Lead Channel Impedance Value: 665 Ohm
Lead Channel Impedance Value: 703 Ohm
Lead Channel Impedance Value: 703 Ohm
Lead Channel Pacing Threshold Pulse Width: 0.4 ms
Lead Channel Pacing Threshold Pulse Width: 0.4 ms
Lead Channel Sensing Intrinsic Amplitude: 1.875 mV
Lead Channel Sensing Intrinsic Amplitude: 13.75 mV
Lead Channel Sensing Intrinsic Amplitude: 25.75 mV
Lead Channel Setting Pacing Amplitude: 1.75 V
Lead Channel Setting Pacing Pulse Width: 0.4 ms
MDC IDC LEAD IMPLANT DT: 20160615
MDC IDC LEAD IMPLANT DT: 20160615
MDC IDC LEAD LOCATION: 753858
MDC IDC LEAD LOCATION: 753859
MDC IDC LEAD LOCATION: 753860
MDC IDC LEAD MODEL: 4398
MDC IDC MSMT BATTERY REMAINING LONGEVITY: 90 mo
MDC IDC MSMT LEADCHNL LV IMPEDANCE VALUE: 399 Ohm
MDC IDC MSMT LEADCHNL LV IMPEDANCE VALUE: 399 Ohm
MDC IDC MSMT LEADCHNL LV IMPEDANCE VALUE: 513 Ohm
MDC IDC MSMT LEADCHNL LV IMPEDANCE VALUE: 703 Ohm
MDC IDC MSMT LEADCHNL LV IMPEDANCE VALUE: 722 Ohm
MDC IDC MSMT LEADCHNL LV PACING THRESHOLD AMPLITUDE: 0.75 V
MDC IDC MSMT LEADCHNL LV PACING THRESHOLD PULSEWIDTH: 0.4 ms
MDC IDC MSMT LEADCHNL RA PACING THRESHOLD AMPLITUDE: 0.75 V
MDC IDC MSMT LEADCHNL RA SENSING INTR AMPL: 2.125 mV
MDC IDC MSMT LEADCHNL RV IMPEDANCE VALUE: 399 Ohm
MDC IDC MSMT LEADCHNL RV IMPEDANCE VALUE: 456 Ohm
MDC IDC MSMT LEADCHNL RV PACING THRESHOLD AMPLITUDE: 0.5 V
MDC IDC SESS DTM: 20170713180138
MDC IDC SET LEADCHNL RA PACING AMPLITUDE: 1.5 V
MDC IDC SET LEADCHNL RV SENSING SENSITIVITY: 0.45 mV
MDC IDC STAT BRADY AP VP PERCENT: 94.11 %
MDC IDC STAT BRADY AS VP PERCENT: 3.81 %

## 2016-05-29 ENCOUNTER — Telehealth: Payer: Self-pay | Admitting: Cardiology

## 2016-05-29 NOTE — Telephone Encounter (Signed)
Spoke with pt and reminded pt of remote transmission that is due today. Pt verbalized understanding.   

## 2016-06-02 NOTE — Progress Notes (Signed)
No ICM remote transmission received for 05/29/2016.  Next 1st ICM remote transmission scheduled for 06/23/2016.

## 2016-06-23 ENCOUNTER — Telehealth: Payer: Self-pay | Admitting: Cardiology

## 2016-06-23 NOTE — Telephone Encounter (Signed)
Spoke with pt and reminded pt of remote transmission that is due today. Pt verbalized understanding.   

## 2016-07-01 ENCOUNTER — Other Ambulatory Visit (HOSPITAL_COMMUNITY): Payer: Self-pay | Admitting: Advanced Practice Midwife

## 2016-07-01 DIAGNOSIS — Z1231 Encounter for screening mammogram for malignant neoplasm of breast: Secondary | ICD-10-CM

## 2016-07-03 ENCOUNTER — Other Ambulatory Visit: Payer: Self-pay | Admitting: Cardiology

## 2016-07-03 ENCOUNTER — Ambulatory Visit (HOSPITAL_COMMUNITY)
Admission: RE | Admit: 2016-07-03 | Discharge: 2016-07-03 | Disposition: A | Discharge status: Home / Self Care | Payer: Medicare Other | Source: Ambulatory Visit | Attending: Advanced Practice Midwife | Admitting: Advanced Practice Midwife

## 2016-07-03 DIAGNOSIS — Z1231 Encounter for screening mammogram for malignant neoplasm of breast: Secondary | ICD-10-CM | POA: Diagnosis not present

## 2016-07-08 ENCOUNTER — Telehealth: Payer: Self-pay

## 2016-07-08 NOTE — Telephone Encounter (Signed)
Call to patient for 1st ICM follow up.  Spoke with patient and advised that no transmission has been received since speaking with her at the July office appointment.  Attempted to help her send a remote transmission but received error message.  Provided tech support number and advised to call to help her get the monitor working.  She stated she would call today.

## 2016-07-09 ENCOUNTER — Other Ambulatory Visit: Payer: Self-pay | Admitting: Cardiology

## 2016-07-09 DIAGNOSIS — E063 Autoimmune thyroiditis: Secondary | ICD-10-CM | POA: Diagnosis not present

## 2016-07-10 NOTE — Progress Notes (Signed)
No ICM remote transmission received 07/08/2016.  Patient unable to send transmission and provided service tech support number.  Next ICM remote transmission scheduled 08/14/2016.

## 2016-07-22 ENCOUNTER — Encounter: Payer: Self-pay | Admitting: Internal Medicine

## 2016-07-22 DIAGNOSIS — I129 Hypertensive chronic kidney disease with stage 1 through stage 4 chronic kidney disease, or unspecified chronic kidney disease: Secondary | ICD-10-CM | POA: Diagnosis not present

## 2016-07-22 DIAGNOSIS — N183 Chronic kidney disease, stage 3 (moderate): Secondary | ICD-10-CM | POA: Diagnosis not present

## 2016-07-22 DIAGNOSIS — I6930 Unspecified sequelae of cerebral infarction: Secondary | ICD-10-CM | POA: Diagnosis not present

## 2016-07-22 DIAGNOSIS — I25119 Atherosclerotic heart disease of native coronary artery with unspecified angina pectoris: Secondary | ICD-10-CM | POA: Diagnosis not present

## 2016-07-22 DIAGNOSIS — I5022 Chronic systolic (congestive) heart failure: Secondary | ICD-10-CM | POA: Diagnosis not present

## 2016-07-22 DIAGNOSIS — E782 Mixed hyperlipidemia: Secondary | ICD-10-CM | POA: Diagnosis not present

## 2016-07-22 DIAGNOSIS — Z950 Presence of cardiac pacemaker: Secondary | ICD-10-CM | POA: Diagnosis not present

## 2016-07-22 DIAGNOSIS — Z23 Encounter for immunization: Secondary | ICD-10-CM | POA: Diagnosis not present

## 2016-07-22 DIAGNOSIS — Z0000 Encounter for general adult medical examination without abnormal findings: Secondary | ICD-10-CM | POA: Diagnosis not present

## 2016-07-22 DIAGNOSIS — E063 Autoimmune thyroiditis: Secondary | ICD-10-CM | POA: Diagnosis not present

## 2016-07-22 DIAGNOSIS — Z720 Tobacco use: Secondary | ICD-10-CM | POA: Diagnosis not present

## 2016-07-31 NOTE — Progress Notes (Signed)
This encounter was created in error - please disregard.

## 2016-08-13 ENCOUNTER — Ambulatory Visit (INDEPENDENT_AMBULATORY_CARE_PROVIDER_SITE_OTHER): Payer: Medicare Other | Admitting: Gastroenterology

## 2016-08-13 ENCOUNTER — Encounter: Payer: Self-pay | Admitting: Gastroenterology

## 2016-08-13 VITALS — BP 107/72 | HR 86 | Temp 98.0°F | Ht 62.0 in | Wt 145.0 lb

## 2016-08-13 DIAGNOSIS — R197 Diarrhea, unspecified: Secondary | ICD-10-CM | POA: Diagnosis not present

## 2016-08-13 DIAGNOSIS — I255 Ischemic cardiomyopathy: Secondary | ICD-10-CM

## 2016-08-13 NOTE — Assessment & Plan Note (Signed)
Improved, doing well with lactase tablets just prn. No need for Viberzi. Colonoscopy up-to-date. Return in 1 year or sooner if needed.

## 2016-08-13 NOTE — Progress Notes (Signed)
Referring Provider: Lupita Raider, MD Primary Care Physician:  Lupita Raider, MD  Primary GI: Dr. Darrick Penna   Chief Complaint  Patient presents with  . Follow-up    doing ok    HPI:   Nicole Clements is a 75 y.o. female presenting today with a history of diarrhea, colonoscopy negative for microscopic colitis Feb 2017. Takes lactaid tablets. Has done well with Viberzi  in the past but was not taking at last visit in April, as she did not need.   Denies abdominal pain, rectal bleeding. Occasional loose stool but not bad. Appetite waxes and wanes. Sometimes doesn't care to eat anything that much. No N/V. No dysphagia. Weight has been overall stable since early January of this year. Feels like bowel habits are more related to what she eats.     Past Medical History:  Diagnosis Date  . CAD (coronary artery disease) April 2014   DES to PLA, occluded PDA 07/2014  . CHF (congestive heart failure) (HCC)   . Hypercholesterolemia   . Hypertension   . Ischemic cardiomyopathy    LVEF 25%-45%  . IVCD (intraventricular conduction defect)   . PVC's (premature ventricular contractions)   . S/P colonoscopy August 2007   Hyperplastic polyps, rare sigmoid and descending colon diverticulosis, small internal hemorrhoids  . STEMI (ST elevation myocardial infarction) (HCC) 07/22/1996    Past Surgical History:  Procedure Laterality Date  . CARDIAC CATHETERIZATION  April 2014   Med Rx  . COLONOSCOPY N/A 12/24/2015   Dr. Fields:moderate sized internal hemorrhoids/moderate diverticulosis, negative microscopic colitis   . EP IMPLANTABLE DEVICE  04/18/2015   BV ICD  . EP IMPLANTABLE DEVICE N/A 04/18/2015   Procedure: BiV ICD Insertion CRT-D;  Surgeon: Duke Salvia, MD;  Location: [facility] INVASIVE CV LAB;  Service: Cardiovascular;  Laterality: N/A;  . LEFT AND RIGHT HEART CATHETERIZATION WITH CORONARY ANGIOGRAM N/A 07/17/2014   Procedure: LEFT AND RIGHT HEART CATHETERIZATION WITH CORONARY ANGIOGRAM;   Surgeon: Micheline Chapman, MD;  Location: [facility] CATH LAB;  Service: Cardiovascular;  Laterality: N/A;  . PERCUTANEOUS CORONARY STENT INTERVENTION (PCI-S)  07/17/2014   Procedure: PERCUTANEOUS CORONARY STENT INTERVENTION (PCI-S);  Surgeon: Micheline Chapman, MD;  Location: [facility] CATH LAB;  Service: Cardiovascular;;  Distal RCA  . S/P Hysterectomy      Current Outpatient Prescriptions  Medication Sig Dispense Refill  . acetaminophen (TYLENOL) 325 MG tablet Take 2 tablets (650 mg total) by mouth every 4 (four) hours as needed for headache or mild pain.    [name] aspirin EC 81 MG tablet Take 1 tablet (81 mg total) by mouth daily. 90 tablet 3  . atorvastatin (LIPITOR) 80 MG tablet Take 1 tablet (80 mg total) by mouth daily. 90 tablet 3  . carvedilol (COREG) 25 MG tablet take 1 tablet by mouth twice a day 60 tablet 6  . Cholecalciferol (VITAMIN D3) 2000 UNITS capsule Take 2,000 Units by mouth daily.      . furosemide (LASIX) 80 MG tablet take 1 tablet by mouth once daily 30 tablet 3  . losartan (COZAAR) 25 MG tablet take 1 tablet by mouth once daily 90 tablet 3  . nitroGLYCERIN (NITROSTAT) 0.4 MG SL tablet Place 1 tablet (0.4 mg total) under the tongue every 5 (five) minutes x 3 doses as needed for chest pain. 25 tablet 2  . Omega-3 Fatty Acids (FISH OIL) 1000 MG CAPS Take 1,000 mg by mouth daily.     . potassium chloride SA (K-DUR,KLOR-CON) 20 MEQ  tablet take 1 tablet by mouth once daily 90 tablet 2  . spironolactone (ALDACTONE) 25 MG tablet Take 0.5 tablet by mouth twice daily     No current facility-administered medications for this visit.     Allergies as of 08/13/2016 - Review Complete 08/13/2016  Allergen Reaction Noted  . Lactose intolerance (gi) Other (See Comments) 04/29/2015  . Sulfa antibiotics Rash 07/16/2011    Family History  Problem Relation Age of Onset  . Colon cancer Neg Hx   . Hypertension Sister   . Diabetes Mellitus II Sister   . Hypertension Brother   . Diabetes Mellitus II  Brother   . Hypertension Brother   . Diabetes Mellitus II Brother   . Hypertension Brother   . Diabetes Mellitus II Brother     Social History   Social History  . Marital status: Single    Spouse name: N/A  . Number of children: 1  . Years of education: N/A   Occupational History  . Retired     Clinical biochemistCustomer service rep   Social History Main Topics  . Smoking status: Current Every Day Smoker    Packs/day: 0.50    Years: 50.00    Types: Cigarettes    Start date: 02/26/1962  . Smokeless tobacco: Never Used     Comment: down to less than 1/2 ppd  . Alcohol use No  . Drug use: No  . Sexual activity: Not Asked   Other Topics Concern  . None   Social History Narrative  . None    Review of Systems: As mentioned in HPI   Physical Exam: BP 107/72   Pulse 86   Temp 98 F (36.7 C) (Oral)   Ht 5\' 2"  (1.575 m)   Wt 145 lb (65.8 kg)   BMI 26.52 kg/m  General:   Alert and oriented. No distress noted. Pleasant and cooperative.  Head:  Normocephalic and atraumatic. Eyes:  Conjuctiva clear without scleral icterus. Abdomen:  +BS, soft, non-tender and non-distended. No rebound or guarding. No HSM or masses noted. Msk:  Symmetrical without gross deformities. Normal posture. Extremities:  Without edema. Neurologic:  Alert and  oriented x4;  grossly normal neurologically. Psych:  Alert and cooperative. Normal mood and affect.

## 2016-08-13 NOTE — Patient Instructions (Addendum)
You can take up to 3 lactase tablets with your meals up to three times a day as needed.   We will see you in 1 year or sooner if needed!

## 2016-08-14 ENCOUNTER — Ambulatory Visit (INDEPENDENT_AMBULATORY_CARE_PROVIDER_SITE_OTHER): Payer: Medicare Other | Admitting: *Deleted

## 2016-08-14 DIAGNOSIS — I255 Ischemic cardiomyopathy: Secondary | ICD-10-CM | POA: Diagnosis not present

## 2016-08-14 DIAGNOSIS — Z95810 Presence of automatic (implantable) cardiac defibrillator: Secondary | ICD-10-CM | POA: Diagnosis not present

## 2016-08-14 DIAGNOSIS — I5022 Chronic systolic (congestive) heart failure: Secondary | ICD-10-CM | POA: Diagnosis not present

## 2016-08-14 NOTE — Progress Notes (Signed)
Remote ICD transmission.   

## 2016-08-14 NOTE — Progress Notes (Signed)
CC'ED TO PCP 

## 2016-08-15 ENCOUNTER — Encounter: Payer: Self-pay | Admitting: Cardiology

## 2016-08-15 NOTE — Progress Notes (Signed)
EPIC Encounter for ICM Monitoring  Patient Name: Nicole Clements is a 75 y.o. female Date: 08/15/2016 Primary Care Physican: Lupita Raider, MD Primary Cardiologist: Wyline Mood Electrophysiologist: Graciela Husbands Dry Weight: 147 lb  Bi-V Pacing:  97.1%        1st ICM encounter  Heart Failure questions reviewed, pt asymptomatic   Thoracic impedance normal   Recommendations: No changes.  Advised to limit salt intake to 2000 mg daily.  Encouraged to call for fluid symptoms.    Follow-up plan: ICM clinic phone appointment on 09/15/2016.  Copy of ICM check sent to device physician.   ICM trend: 08/14/2016       Karie Soda, RN 08/15/2016 11:54 AM

## 2016-09-01 IMAGING — CR DG CHEST 2V
2 series · 2 of 2 positions shown · non-contrast
Comparison: July 12, 2014 and January 02, 2013

CLINICAL DATA: Cardiac arrhythmias; defibrillator placement

EXAM:
CHEST  2 VIEW

[chest pa]
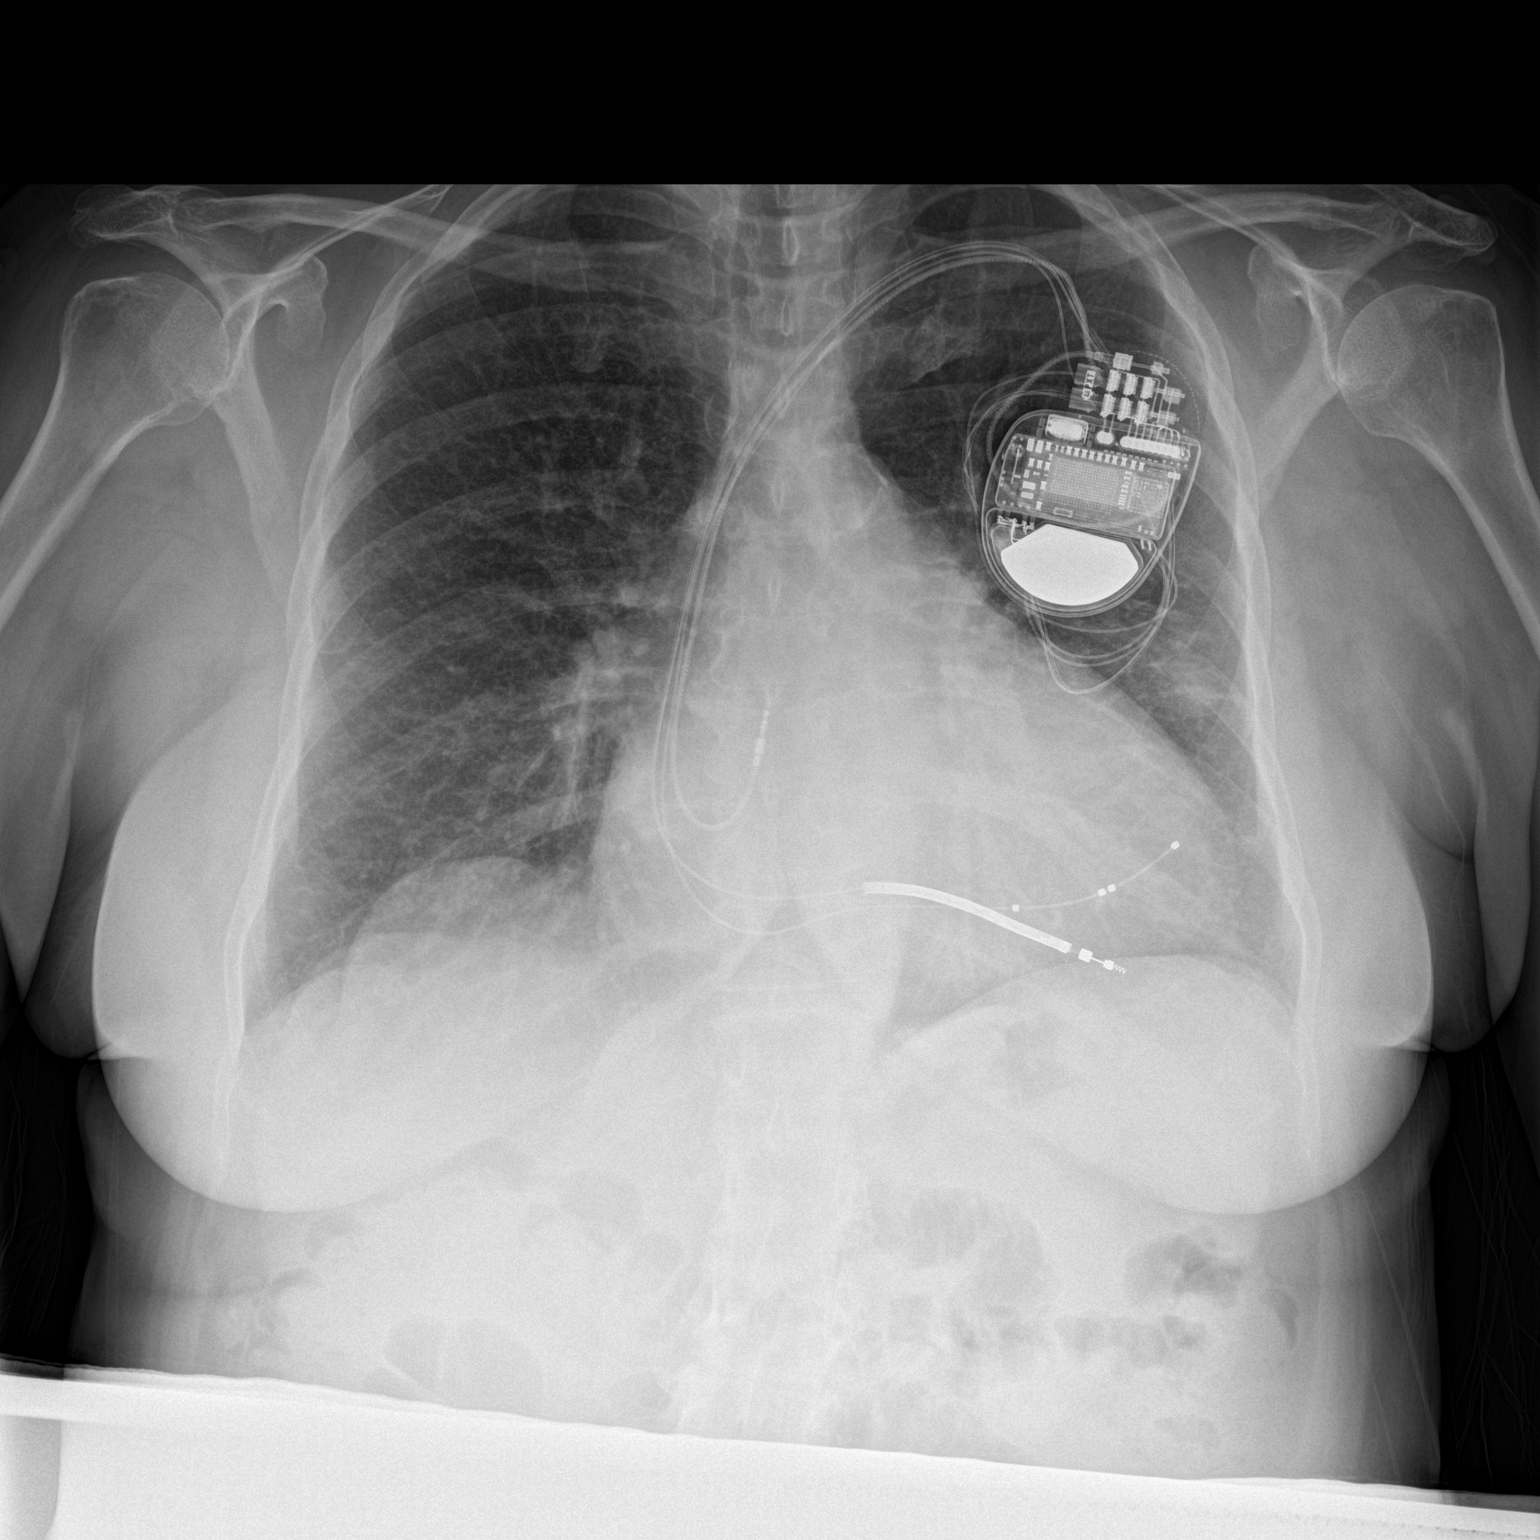

[chest lat]
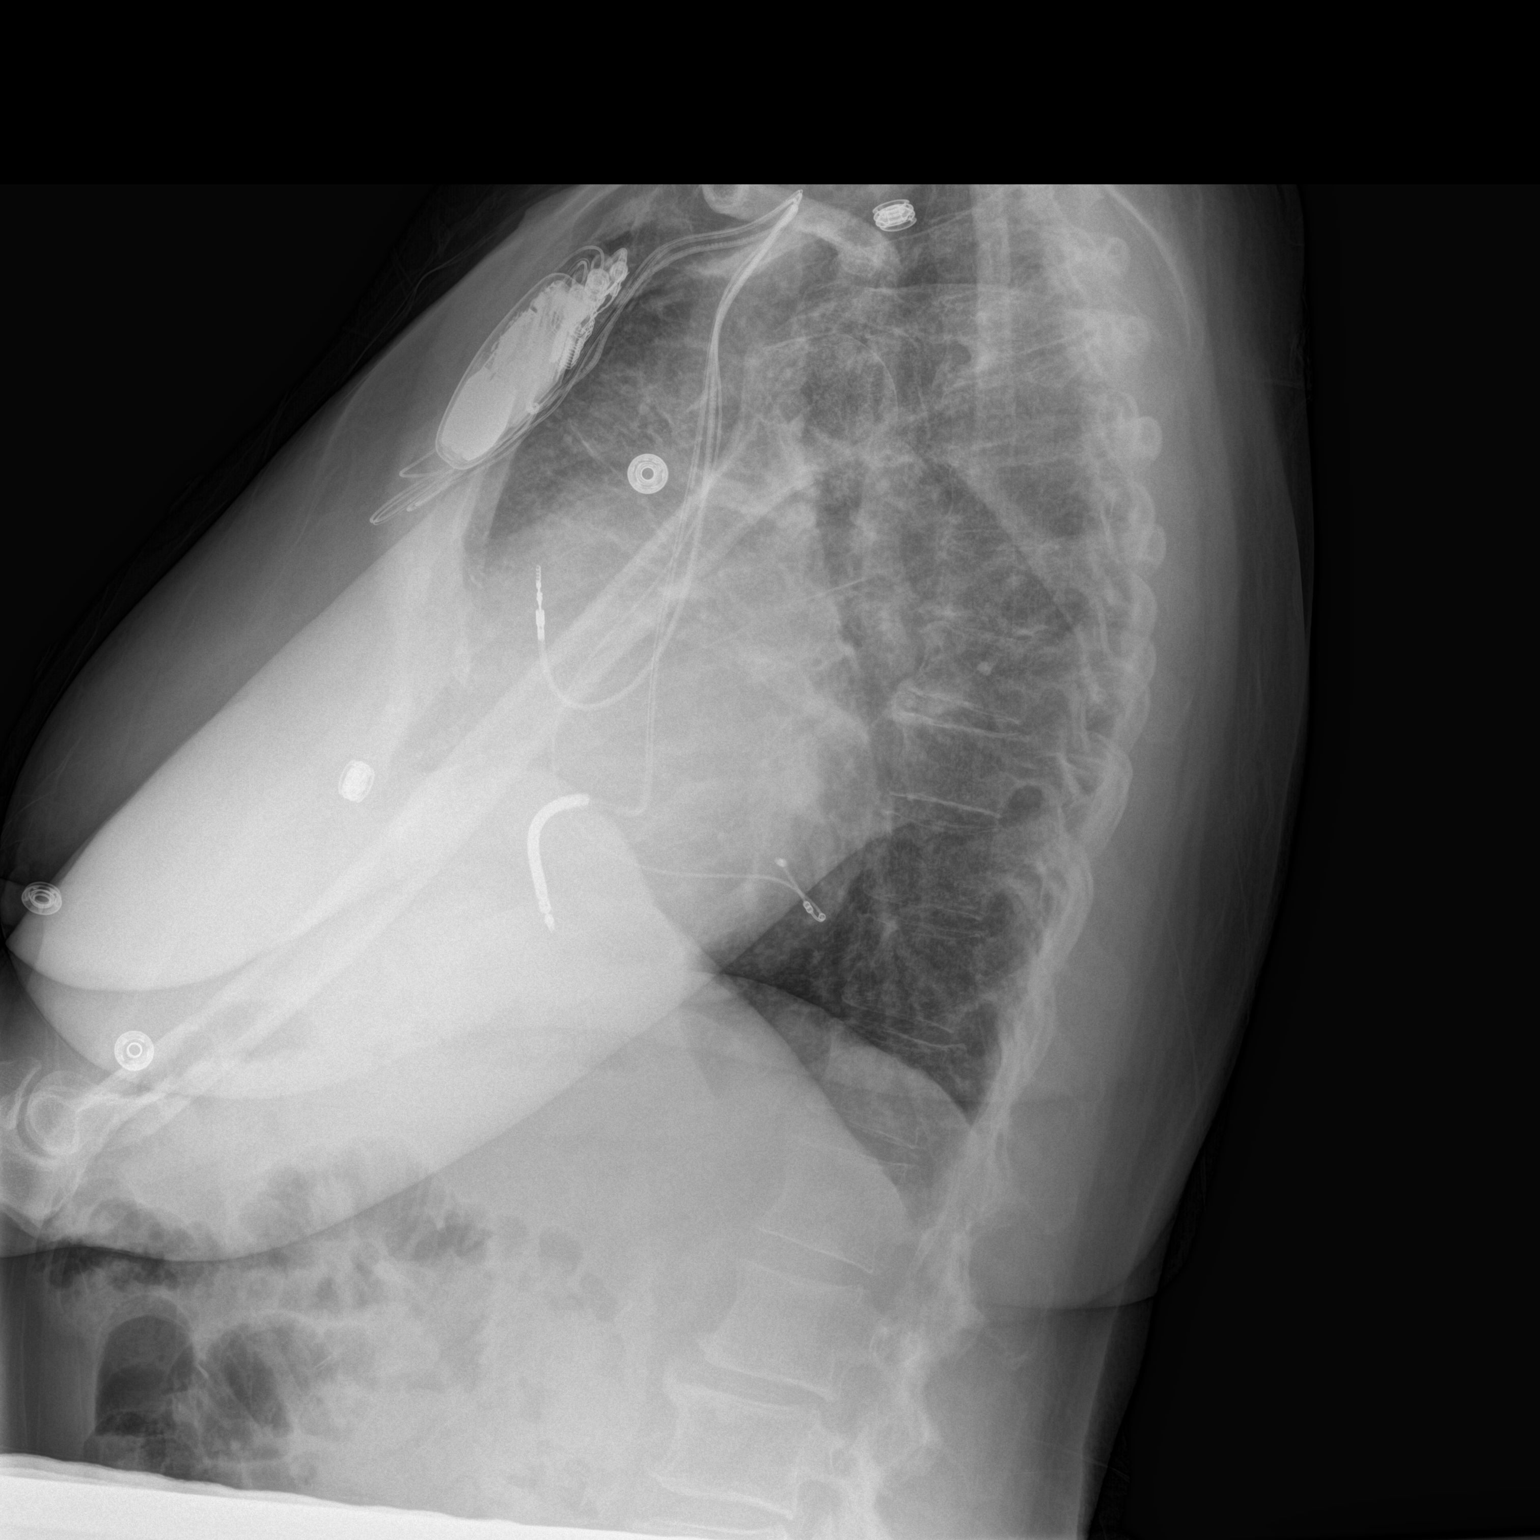

[2 of 2 positions shown; findings below may reference images not displayed]

FINDINGS: Defibrillator leads extend to the right atrium, middle cardiac vein,
and left ventricle. No pneumothorax. Heart is mildly enlarged with
pulmonary vascular within normal limits. There are areas of scarring
in the left mid and lower lung zones. Elsewhere lungs are clear. No
adenopathy. There is degenerative change in the thoracic spine.
IMPRESSION: Scarring left mid lower lung zones. Lungs elsewhere clear.
Defibrillator leads as described. No pneumothorax. Heart mildly
enlarged but stable.

## 2016-09-11 IMAGING — CT CT HEAD W/O CM
1 series · 16 of 30 positions shown, 20 images · non-contrast
Comparison: None.

CLINICAL DATA: Aphasia.  Slurred speech.  Right facial droop.

EXAM:
CT HEAD WITHOUT CONTRAST
TECHNIQUE: Contiguous axial images were obtained from the base of the skull
through the vertex without intravenous contrast.

[Series 2: headseq 4.8 h37s · axial · 0.46mm/px · z∈[+78,+232]mm · 16 of 36 slices shown, 20 images]
[im 2/36  brain]
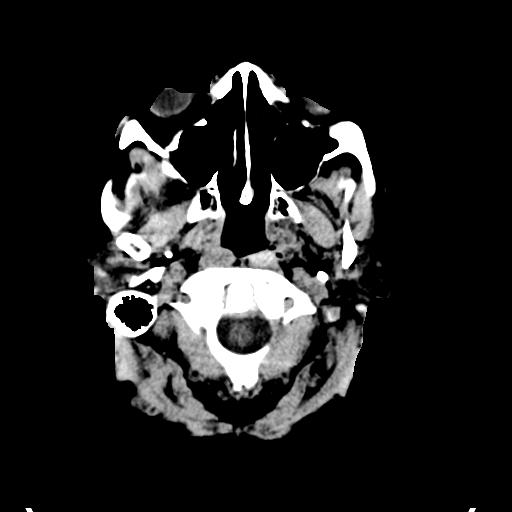
[im 2/36  bone]
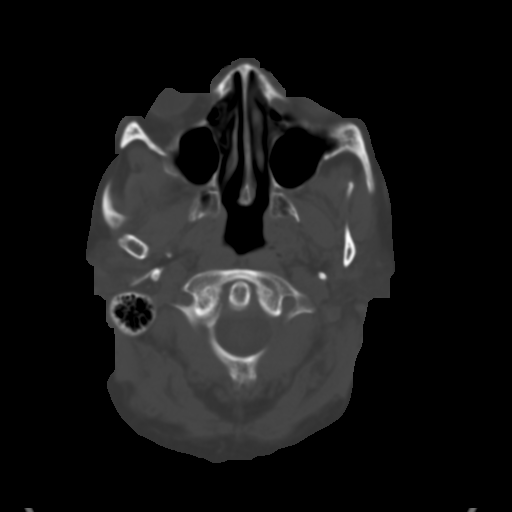
[im 4/36  brain]
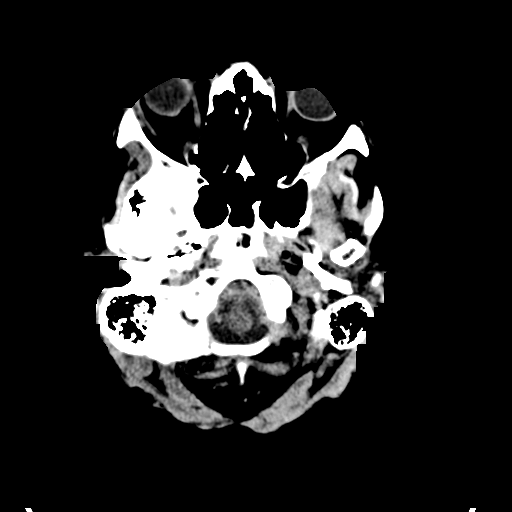
[im 7/36  brain]
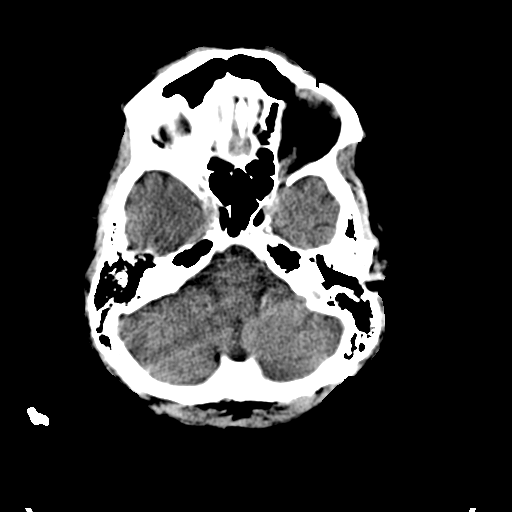
[im 9/36  brain]
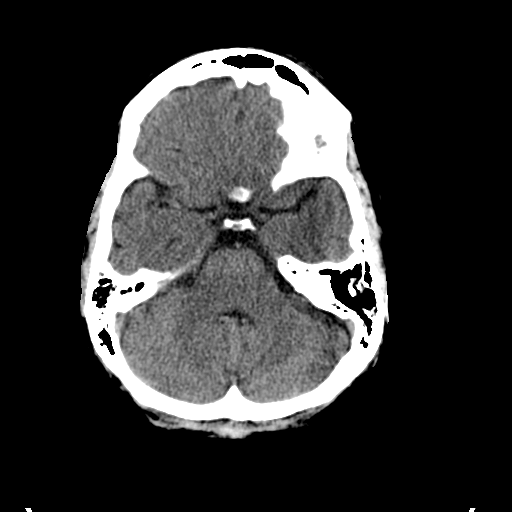
[im 10/36  brain]
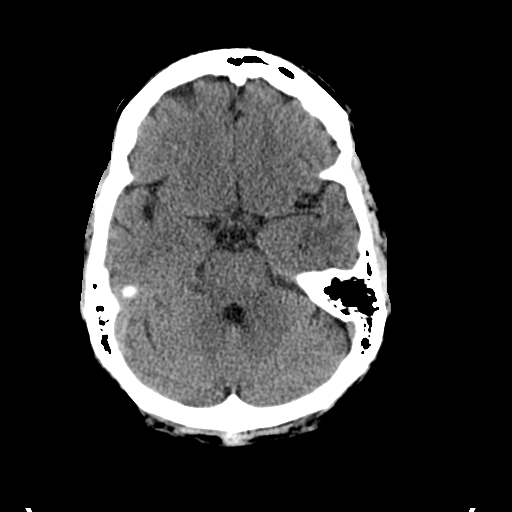
[im 10/36  bone]
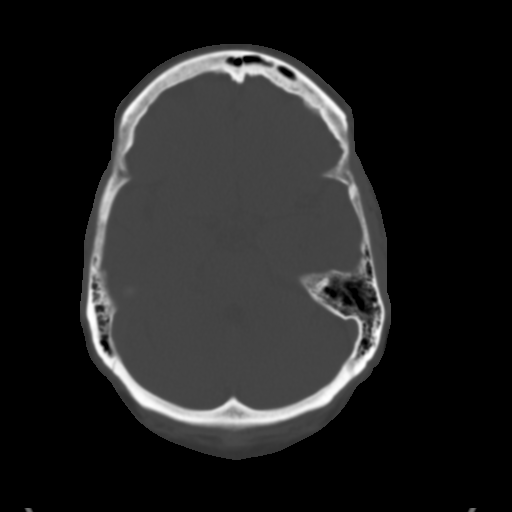
[im 13/36  brain]
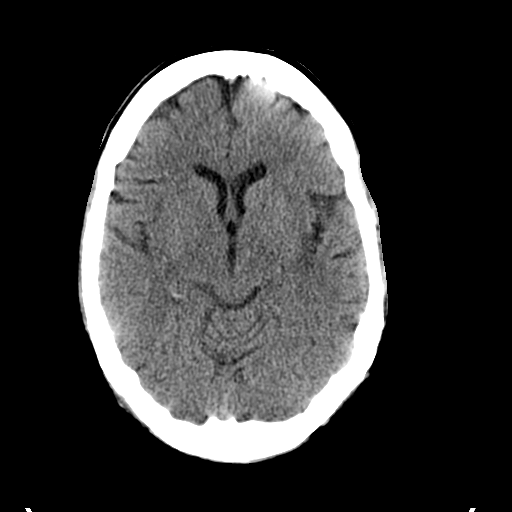
[im 15/36  brain]
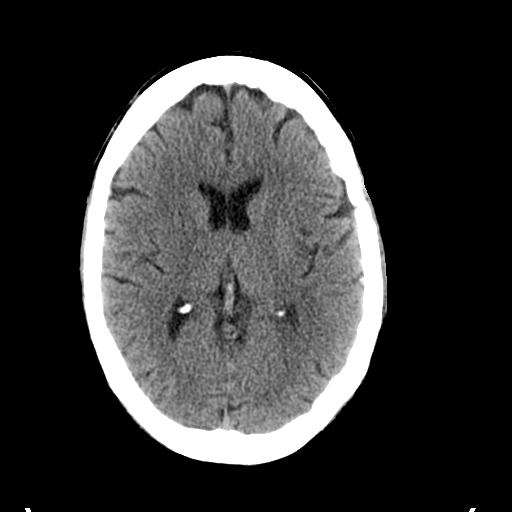
[im 17/36  brain]
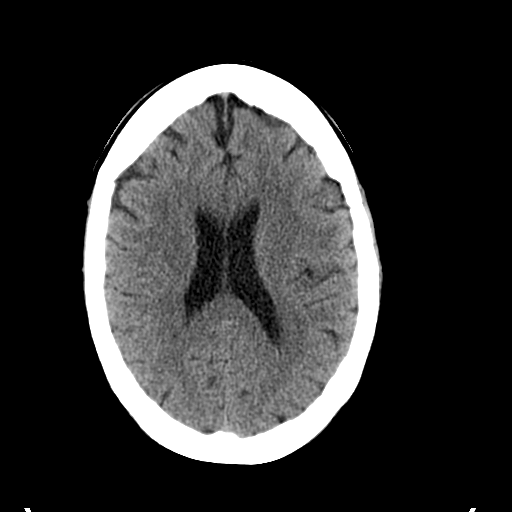
[im 19/36  brain]
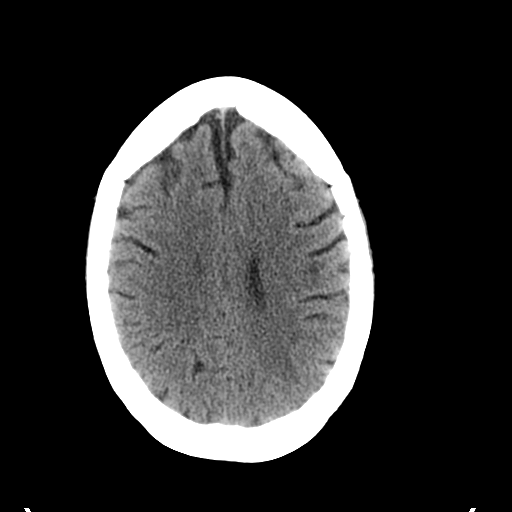
[im 19/36  bone]
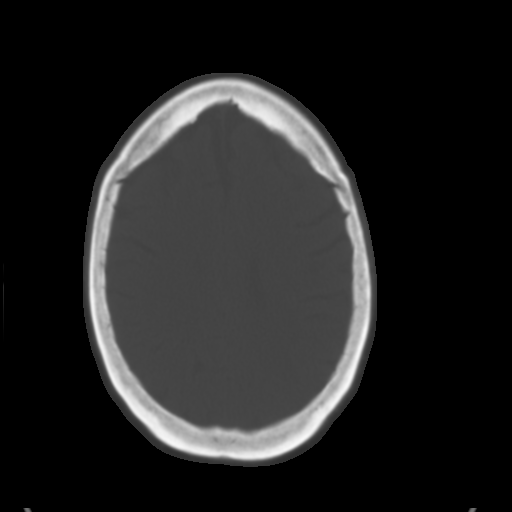
[im 21/36  brain]
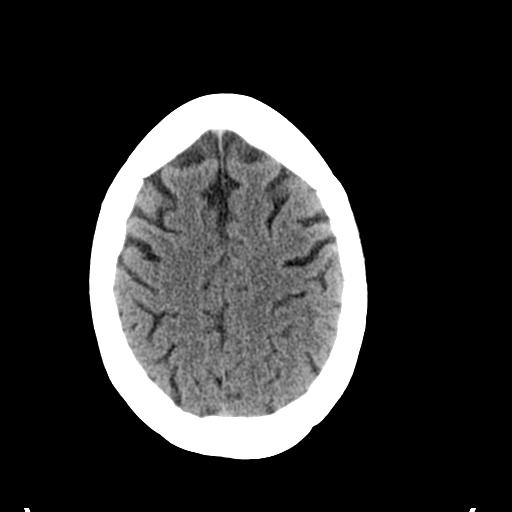
[im 23/36  brain]
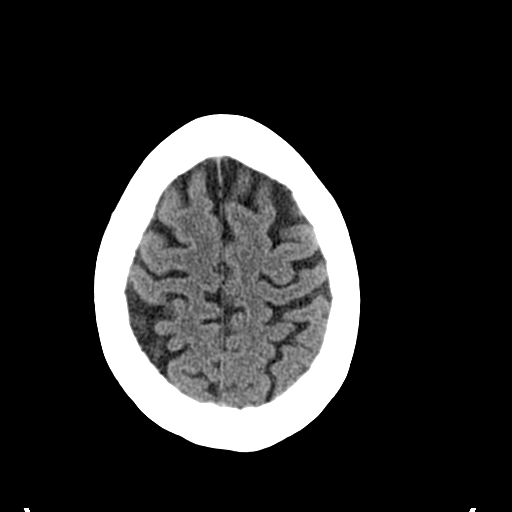
[im 26/36  brain]
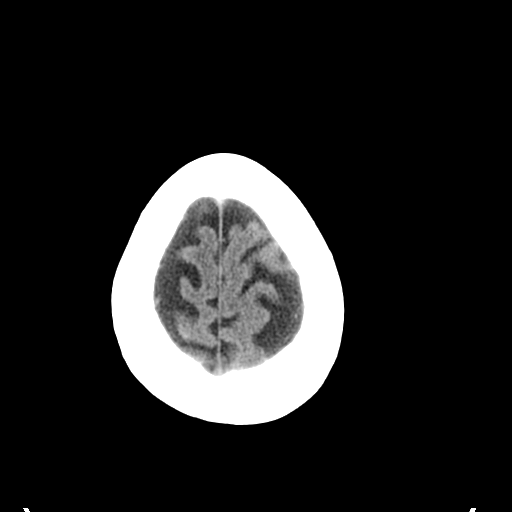
[im 27/36  brain]
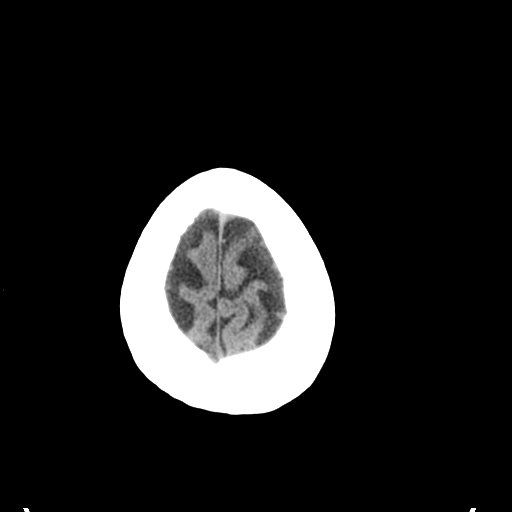
[im 27/36  bone]
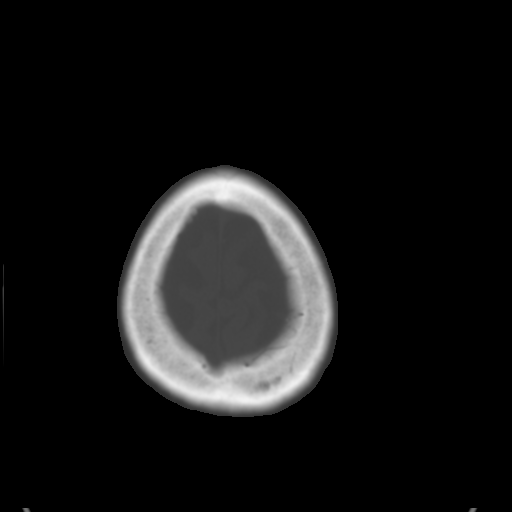
[im 29/36  brain]
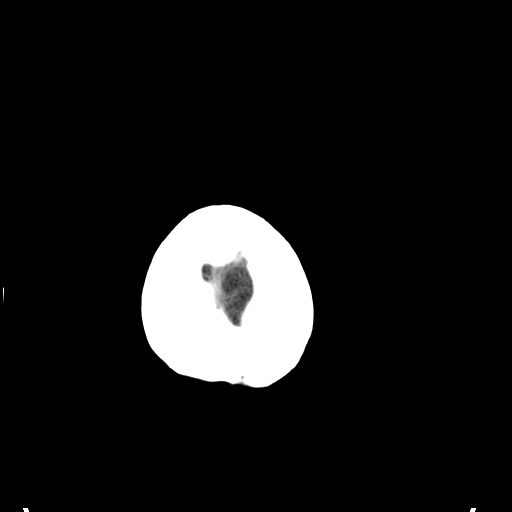
[im 32/36  brain]
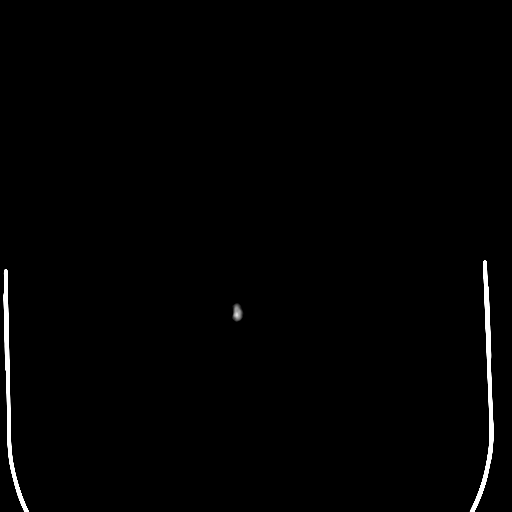
[im 34/36  brain]
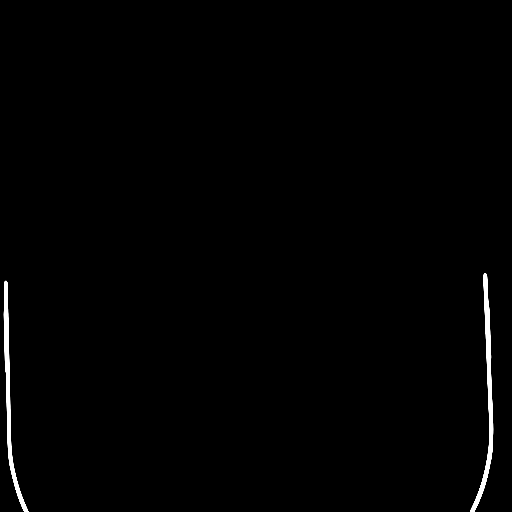

[16 of 30 positions shown; findings below may reference images not displayed]

FINDINGS: No intracranial hemorrhage, mass effect, or midline shift. No
hydrocephalus. The basilar cisterns are patent. No evidence of
territorial infarct. No intracranial fluid collection. Calvarium is
intact. Included paranasal sinuses and mastoid air cells are well
aerated.
IMPRESSION: No acute intracranial abnormality on noncontrast head CT.

## 2016-09-15 ENCOUNTER — Ambulatory Visit (INDEPENDENT_AMBULATORY_CARE_PROVIDER_SITE_OTHER): Payer: Medicare Other

## 2016-09-15 DIAGNOSIS — Z95810 Presence of automatic (implantable) cardiac defibrillator: Secondary | ICD-10-CM | POA: Diagnosis not present

## 2016-09-15 DIAGNOSIS — I5022 Chronic systolic (congestive) heart failure: Secondary | ICD-10-CM | POA: Diagnosis not present

## 2016-09-15 LAB — CUP PACEART REMOTE DEVICE CHECK
Battery Remaining Longevity: 88 mo
Brady Statistic AP VP Percent: 96.03 %
Brady Statistic AP VS Percent: 1.56 %
Brady Statistic AS VP Percent: 2.35 %
Brady Statistic RA Percent Paced: 97.59 %
Brady Statistic RV Percent Paced: 37.4 %
HighPow Impedance: 68 Ohm
Implantable Lead Implant Date: 20160615
Implantable Lead Implant Date: 20160615
Implantable Lead Location: 753858
Implantable Lead Location: 753860
Implantable Lead Model: 5076
Lead Channel Impedance Value: 342 Ohm
Lead Channel Impedance Value: 399 Ohm
Lead Channel Impedance Value: 418 Ohm
Lead Channel Impedance Value: 418 Ohm
Lead Channel Impedance Value: 456 Ohm
Lead Channel Impedance Value: 703 Ohm
Lead Channel Pacing Threshold Amplitude: 0.625 V
Lead Channel Pacing Threshold Pulse Width: 0.4 ms
Lead Channel Pacing Threshold Pulse Width: 0.4 ms
Lead Channel Sensing Intrinsic Amplitude: 1.875 mV
Lead Channel Sensing Intrinsic Amplitude: 15.125 mV
Lead Channel Sensing Intrinsic Amplitude: 15.125 mV
Lead Channel Setting Pacing Amplitude: 1.5 V
Lead Channel Setting Pacing Amplitude: 1.75 V
Lead Channel Setting Pacing Pulse Width: 0.4 ms
MDC IDC LEAD IMPLANT DT: 20160615
MDC IDC LEAD LOCATION: 753859
MDC IDC LEAD MODEL: 4398
MDC IDC MSMT BATTERY VOLTAGE: 2.99 V
MDC IDC MSMT LEADCHNL LV IMPEDANCE VALUE: 456 Ohm
MDC IDC MSMT LEADCHNL LV IMPEDANCE VALUE: 608 Ohm
MDC IDC MSMT LEADCHNL LV IMPEDANCE VALUE: 665 Ohm
MDC IDC MSMT LEADCHNL LV IMPEDANCE VALUE: 703 Ohm
MDC IDC MSMT LEADCHNL LV IMPEDANCE VALUE: 722 Ohm
MDC IDC MSMT LEADCHNL LV PACING THRESHOLD AMPLITUDE: 0.75 V
MDC IDC MSMT LEADCHNL RA PACING THRESHOLD AMPLITUDE: 0.625 V
MDC IDC MSMT LEADCHNL RA PACING THRESHOLD PULSEWIDTH: 0.4 ms
MDC IDC MSMT LEADCHNL RA SENSING INTR AMPL: 1.875 mV
MDC IDC MSMT LEADCHNL RV IMPEDANCE VALUE: 456 Ohm
MDC IDC MSMT LEADCHNL RV IMPEDANCE VALUE: 532 Ohm
MDC IDC PG IMPLANT DT: 20160615
MDC IDC SESS DTM: 20171012073523
MDC IDC SET LEADCHNL RV PACING AMPLITUDE: 2 V
MDC IDC SET LEADCHNL RV PACING PULSEWIDTH: 0.4 ms
MDC IDC SET LEADCHNL RV SENSING SENSITIVITY: 0.45 mV
MDC IDC STAT BRADY AS VS PERCENT: 0.05 %

## 2016-09-15 NOTE — Progress Notes (Signed)
EPIC Encounter for ICM Monitoring  Patient Name: Nicole Clements is a 76 y.o. female Date: 09/15/2016 Primary Care Physican: Lupita Raider, MD Primary Cardiologist: Branch Electrophysiologist: Graciela Husbands Dry Weight:    147 lb  Bi-V Pacing:  96.1%            Heart Failure questions reviewed, pt asymptomatic   Thoracic impedance normal today but impedance has been abnormal suggesting fluid accumulation for the last few weeks.  Recommendations: No changes.  Reminded to look at food labels for salt amount and to limit salt intake to 2000 mg daily.  Advised holidays are an easy time to take in too much salt.  Encouraged to call for fluid symptoms.    Follow-up plan: ICM clinic phone appointment on 10/16/2016. Appointment with Dr Wyline Mood 10/07/2016  Copy of ICM check sent to device physician.   ICM trend: 09/15/2016       Karie Soda, RN 09/15/2016 2:37 PM

## 2016-09-15 NOTE — Progress Notes (Signed)
Normal remote reviewed.  Next [facility] 11/13/16. Also followed in [facility] clinic.

## 2016-10-07 ENCOUNTER — Ambulatory Visit (INDEPENDENT_AMBULATORY_CARE_PROVIDER_SITE_OTHER): Payer: Medicare Other | Admitting: Cardiology

## 2016-10-07 ENCOUNTER — Encounter: Payer: Self-pay | Admitting: Cardiology

## 2016-10-07 VITALS — BP 104/58 | HR 88 | Ht 62.0 in | Wt 149.0 lb

## 2016-10-07 DIAGNOSIS — E782 Mixed hyperlipidemia: Secondary | ICD-10-CM | POA: Diagnosis not present

## 2016-10-07 DIAGNOSIS — I5022 Chronic systolic (congestive) heart failure: Secondary | ICD-10-CM | POA: Diagnosis not present

## 2016-10-07 DIAGNOSIS — I2510 Atherosclerotic heart disease of native coronary artery without angina pectoris: Secondary | ICD-10-CM

## 2016-10-07 DIAGNOSIS — I255 Ischemic cardiomyopathy: Secondary | ICD-10-CM | POA: Diagnosis not present

## 2016-10-07 DIAGNOSIS — I340 Nonrheumatic mitral (valve) insufficiency: Secondary | ICD-10-CM | POA: Diagnosis not present

## 2016-10-07 NOTE — Patient Instructions (Signed)
Your physician wants you to follow-up in: 4 Months with Dr. Branch. You will receive a reminder letter in the mail two months in advance. If you don't receive a letter, please call our office to schedule the follow-up appointment.  Your physician recommends that you continue on your current medications as directed. Please refer to the Current Medication list given to you today.  If you need a refill on your cardiac medications before your next appointment, please call your pharmacy.  Thank you for choosing [facility]!   

## 2016-10-07 NOTE — Progress Notes (Signed)
Clinical Summary Nicole Clements is a 75 y.o.female seen today for follow up of the following medical problems.   1. 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 Admit 07/2014 with CHF and troponin elevation - cath 07/2014, LM 30% distal, LAD mid 50%, LCX ostial 50% and mid 50%, small intermediate [name] 70%. RCA diffuse 50%, distal RCA with ulcerated 90% lesion, PDA 80-90% too small for PCI. LVEF 30%. Received DES to distal RCA, post procedure the PDA became occluded.  - echo 07/2014 LVEF 20%, inferior akinesis, grade II diastolic dysfunction. Repeat echo 10/2014 LVEF remains 15-20%.  - 04/2015 CRT-D device placed by Dr Graciela Husbands. Normal function 05/2016.  10/2015 echo LVEF 25-30%    - no recent SOB/DOE. No recent LE edema. Home weights stable around 148-149 lbs.  - compliant with meds  2. Mitral regurgitation - mild by most recent echo - no recent symptoms  3. Hyperlipidemia - she is compliant with pravastatin 80mg  daily - 07/2016 TC 102 HDL 40 TG 88 LDL 45    SH: frequently travels to [place] to visit her daughter. Heading up by train for Christmas.   Past Medical History:  Diagnosis Date  . CAD (coronary artery disease) April 2014   DES to PLA, occluded PDA 07/2014  . CHF (congestive heart failure) (HCC)   . Hypercholesterolemia   . Hypertension   . Ischemic cardiomyopathy    LVEF 25%-45%  . IVCD (intraventricular conduction defect)   . PVC's (premature ventricular contractions)   . S/P colonoscopy August 2007   Hyperplastic polyps, rare sigmoid and descending colon diverticulosis, small internal hemorrhoids  . STEMI (ST elevation myocardial infarction) (HCC) 07/22/1996     Allergies  Allergen Reactions  . Lactose Intolerance (Gi) Other (See Comments)    GI Upset   . Sulfa Antibiotics Rash     Current Outpatient Prescriptions  Medication Sig Dispense Refill  . acetaminophen (TYLENOL) 325 MG tablet Take 2 tablets (650 mg total) by mouth every 4  (four) hours as needed for headache or mild pain.    [name] aspirin EC 81 MG tablet Take 1 tablet (81 mg total) by mouth daily. 90 tablet 3  . atorvastatin (LIPITOR) 80 MG tablet Take 1 tablet (80 mg total) by mouth daily. 90 tablet 3  . carvedilol (COREG) 25 MG tablet take 1 tablet by mouth twice a day 60 tablet 6  . Cholecalciferol (VITAMIN D3) 2000 UNITS capsule Take 2,000 Units by mouth daily.      . furosemide (LASIX) 80 MG tablet take 1 tablet by mouth once daily 30 tablet 3  . losartan (COZAAR) 25 MG tablet take 1 tablet by mouth once daily 90 tablet 3  . nitroGLYCERIN (NITROSTAT) 0.4 MG SL tablet Place 1 tablet (0.4 mg total) under the tongue every 5 (five) minutes x 3 doses as needed for chest pain. 25 tablet 2  . Omega-3 Fatty Acids (FISH OIL) 1000 MG CAPS Take 1,000 mg by mouth daily.     . potassium chloride SA (K-DUR,KLOR-CON) 20 MEQ tablet take 1 tablet by mouth once daily 90 tablet 2  . spironolactone (ALDACTONE) 25 MG tablet Take 0.5 tablet by mouth twice daily     No current facility-administered medications for this visit.      Past Surgical History:  Procedure Laterality Date  . CARDIAC CATHETERIZATION  April 2014   Med Rx  . COLONOSCOPY N/A 12/24/2015   Dr. Fields:moderate sized internal hemorrhoids/moderate diverticulosis, negative microscopic colitis   .  EP IMPLANTABLE DEVICE  04/18/2015   BV ICD  . EP IMPLANTABLE DEVICE N/A 04/18/2015   Procedure: BiV ICD Insertion CRT-D;  Surgeon: Duke SalviaSteven C Klein, MD;  [facility];  Service: Cardiovascular;  Laterality: N/A;  . LEFT AND RIGHT HEART CATHETERIZATION WITH CORONARY ANGIOGRAM N/A 07/17/2014   Procedure: LEFT AND RIGHT HEART CATHETERIZATION WITH CORONARY ANGIOGRAM;  Surgeon: Micheline ChapmanMichael D Cooper, MD;  [facility];  Service: Cardiovascular;  Laterality: N/A;  . PERCUTANEOUS CORONARY STENT INTERVENTION (PCI-S)  07/17/2014   Procedure: PERCUTANEOUS CORONARY STENT INTERVENTION (PCI-S);  Surgeon: Micheline ChapmanMichael D  Cooper, MD;  [facility];  Service: Cardiovascular;;  Distal RCA  . S/P Hysterectomy       Allergies  Allergen Reactions  . Lactose Intolerance (Gi) Other (See Comments)    GI Upset   . Sulfa Antibiotics Rash      Family History  Problem Relation Age of Onset  . Hypertension Sister   . Diabetes Mellitus II Sister   . Hypertension Brother   . Diabetes Mellitus II Brother   . Hypertension Brother   . Diabetes Mellitus II Brother   . Hypertension Brother   . Diabetes Mellitus II Brother   . Colon cancer Neg Hx      Social History [name] reports that she has been smoking Cigarettes.  She started smoking about 54 years ago. She has a 25.00 pack-year smoking history. She has never used smokeless tobacco. [name] reports that she does not drink alcohol.   Review of Systems CONSTITUTIONAL: No weight loss, fever, chills, weakness or fatigue.  HEENT: Eyes: No visual loss, blurred vision, double vision or yellow sclerae.No hearing loss, sneezing, congestion, runny nose or sore throat.  SKIN: No rash or itching.  CARDIOVASCULAR: per HPI RESPIRATORY: No shortness of breath, cough or sputum.  GASTROINTESTINAL: No anorexia, nausea, vomiting or diarrhea. No abdominal pain or blood.  GENITOURINARY: No burning on urination, no polyuria NEUROLOGICAL: No headache, dizziness, syncope, paralysis, ataxia, numbness or tingling in the extremities. No change in bowel or bladder control.  MUSCULOSKELETAL: No muscle, back pain, joint pain or stiffness.  LYMPHATICS: No enlarged nodes. No history of splenectomy.  PSYCHIATRIC: No history of depression or anxiety.  ENDOCRINOLOGIC: No reports of sweating, cold or heat intolerance. No polyuria or polydipsia.  [name].   Physical Examination Vitals:   10/07/16 0844  BP: (!) 104/58  Pulse: 88   Vitals:   10/07/16 0844  Weight: 149 lb (67.6 kg)  Height: 5\' 2"  (1.575 m)    Gen: resting comfortably, no acute distress HEENT: no  scleral icterus, pupils equal round and reactive, no palptable cervical adenopathy,  CV: RRR, no m/r/g, no jvd Resp: Clear to auscultation bilaterally GI: abdomen is soft, non-tender, non-distended, normal bowel sounds, no hepatosplenomegaly MSK: extremities are warm, no edema.  Skin: warm, no rash Neuro:  no focal deficits Psych: appropriate affect   Diagnostic Studies 02/2013 Cath  Procedural Findings:  Hemodynamics:  AO 116/45 with a mean of 70  LV 120/18  Coronary angiography:  Coronary dominance: right  Left mainstem: The left main is patent with 30% distal left main stenosis as the vessel trifurcates into the LAD, intermediate Audley Hinojos, and left circumflex.  Left anterior descending (LAD): The LAD is patent with diffuse disease noted. There is minimal calcification present. The first diagonal is patent with 30-50% diffuse disease. The LAD after the first septal perforator has 40-50% stenosis. The vessel reaches the apex and wraps around  the left ventricular apex without significant stenosis.  Left circumflex (LCx): The intermediate Mandeep Ferch is small in caliber and diffusely diseased with 50-60% proximal vessel stenosis the AV groove circumflex is diffusely diseased with 50% ostial stenosis and 30% mid stenosis it supplies 2 small posterolateral Nyah Shepherd  Right coronary artery (RCA): The RCA is diffusely diseased. There is significant tortuosity, especially around the junction of the mid and distal vessel. The vessel is diffusely calcified. The mid vessel has tandem 50% stenoses. The distal vessel has 80-90 % stenosis just before the bifurcation of the PDA and posterior AV segment. The PDA and posterior AV segment with 3 posterolateral branches are patent.  Left ventriculography: Left ventricular systolic function is moderately depressed. There is severe hypokinesis of the basal and midinferior walls. The anterolateral and apical walls contract normally. The left ventricular ejection  fraction is estimated at 45%.  Abdominal aortogram: The abdominal aorta is patent. The renal arteries are single bilaterally and they are widely patent. There appears to be nonobstructive stenosis of the right common iliac artery at the aortoiliac junction. The left common iliac artery appears to have significant ostial stenosis estimated at at least 75% angiographically. The visualized portions of the external iliac and common femoral arteries are patent  Final Conclusions:  1. Severe single-vessel coronary artery disease involving the distal right coronary artery  2. Moderate LV dysfunction with segmental severe hypokinesis of the inferior wall and normal contraction of the antero-apex with estimated left ventricular ejection fraction of 45%.  3. Severe left common iliac artery stenosis  Recommendations: The patient has severe distal RCA disease. She has a known history of inferior wall infarction and remote PCA. This fits with that clinical history. She has nonobstructive disease of the left coronary artery. Her left ventricular ejection fraction by ventriculography is clearly greater than 35%. With her history of previous MI, I do not think PCI is indicated with an absence of angina. Will carefully discussed symptoms with the patient, but I am inclined to treat her medically. Will also review indication for consideration of peripheral intervention for her iliac disease.    01/2013 Echo  Study Conclusions  - Left ventricle: The cavity size was mildly to moderately dilated. Wall thickness was normal. Systolic function was severely reduced. The estimated ejection fraction was 25%. Severe diffuse hypokinesis. - Aortic valve: Mildly calcified annulus. Trileaflet. - Mitral valve: Calcified annulus. - Left atrium: The atrium was moderately dilated. - Right ventricle: The cavity size was normal. Wall thickness was mildly to moderately increased. - Right atrium: The atrium was mildly  dilated. - Atrial septum: No defect or patent foramen ovale was identified. Impressions:  - Compared to the prior study of 03/13/11, there has been substantial deterioration in LV systolic function.   07/2014 Echo Study Conclusions  - Left ventricle: The cavity size was severely dilated. Wall thickness was normal. The estimated ejection fraction was 20%. Diffuse hypokinesis. There is akinesis of the inferolateral and inferior myocardium. Features are consistent with a pseudonormal left ventricular filling pattern, with concomitant abnormal relaxation and increased filling pressure (grade 2 diastolic dysfunction). Doppler parameters are consistent with high ventricular filling pressure. - Mitral valve: There was mild to moderate regurgitation. - Left atrium: The atrium was moderately dilated.  Impressions:  - Global hypokinesis; inferior and inferolateral akinesis; overall severely reduced LV function; grade 2 diastolic dysfunction with elevated left ventricular filling pressures, moderate LAE; mild to moderate MR.  07/2014 Cath PROCEDURAL FINDINGS  Hemodynamics:  AO 122/48  LV  118/14  RA 9  RV 41/11  PA 37/5 with a mean of 21  PCWP 27  Oxygen saturations  AO 90  PA 56  Cardiac output 3.5  Cardiac index 2.0  Coronary angiography:  Coronary dominance: right  Left mainstem: The left mainstem is patent with 30% distal left main stenosis.  Left anterior descending (LAD): The LAD is diffusely diseased in the midportion. There are sequential 50% stenoses, unchanged from previous study. The first diagonal Yehoshua Vitelli is large in distribution with diffuse nonobstructive disease noted. The mid and distal LAD are patent without significant disease.  Left circumflex (LCx): The left circumflex has 50% ostial stenosis, 50% mid vessel stenosis, and to OM branches with no significant disease. There is an intermediate Alanzo Lamb is diffusely diseased with up to 70% stenosis. This  vessel is small in caliber.  Right coronary artery (RCA): Dominant vessel. Tortuous with diffuse nonobstructive stenosis throughout the proximal and mid portions. There are diffuse 50% stenosis present. The distal vessel has an ulcerated-appearing irregular 90% stenosis involving the bifurcation of the PDA and posterolateral branches. The posterolateral branches are patent with mild diffuse disease. The PDA arises an acute angulation with 30-40% stenosis. The mid body of the PDA has an 80-90% stenosis in an area where the vessel is too small for PCI.  Left ventriculography: The ventricle appears the synchronous. The basal and midinferior wall are akinetic. The basal inferolateral wall is severely hypokinetic. The anterolateral wall and apex contract normally. The estimated LVEF is 30%.  PCI Procedure Note: Following the diagnostic procedure, the decision was made to proceed with PCI of the distal RCA. The lesion is complex, there is a large amount of myocardium involved. I felt there was a risk of potentially compromising the PDA Shavette Shoaff, but overall in my opinion the risk/benefit was favorable for PCI considering the severe stenosis involving the entire RCA distribution. The sheath was upsized to a 6 [place]. Weight-based bivalirudin was given for anticoagulation. The patient was loaded with Plavix 600 mg on the table. Once a therapeutic ACT was achieved, a 6 [place] JR 4 guide catheter was inserted. A cougar coronary guidewire was used to cross the lesion into the PLA Aracelia Brinson. The lesion was predilated with a 2.5 x 15 mm balloon. The lesion was then stented with a 3.0 by 16mm Promus DES stent. The stent was postdilated with a 3.25 mm noncompliant balloon to 18 atmospheres. The PDA was stented across as the stent extended from the distal RCA proximal PLA Avy Barlett. The PDA was totally occluded. I made a long attempt with a whisper wire using multiple bands on the tip of the wire, but all of these attempts were  unsuccessful at rewiring the PDA. The patient was completely asymptomatic and specifically denied any chest pain. Her hemodynamics remained stable and there were no changes in her rhythm or blood pressure. A final image of the left coronary artery was obtained and this demonstrated a collateral from the LAD to the PDA Anakaren Campion. Following PCI, there was 0% residual stenosis and TIMI-3 flow. Final angiography confirmed an excellent result. Femoral hemostasis was achieved will be achieved with manual hemostasis. The patient tolerated the PCI procedure well. There were no immediate procedural complications. The patient was transferred to the post catheterization recovery area for further monitoring.  PCI Data:  Vessel - RCA/Segment - distal  Percent Stenosis (pre) 90  TIMI-flow 3  Stent 3.0 by 16mm Promus DES  Percent Stenosis (post) 0  TIMI-flow (post) 3  Contrast:  175 cc Omnipaque  Radiation dose/Fluoro time: 15.5 minutes  Estimated Blood Loss: Minimal  Final Conclusions:  1. Nonobstructive disease involving the LAD, left circumflex, and OM/diagonal side branches  2. Severe stenosis of the distal RCA bifurcation, treated successfully with a drug-eluting stent into the PLA Mikle Sternberg with residual occlusion of the PDA Joeph Szatkowski  3. Severe segmental LV systolic dysfunction  Recommendations: Dual antiplatelet therapy with aspirin and Plavix for at least 12 months. Will cycle cardiac enzymes since the PDA Kacyn Souder is occluded after stenting.  10/2014 Echo Study Conclusions  - Procedure narrative: Transthoracic echocardiography. Image quality was suboptimal. The study was technically difficult, as a result of poor sound wave transmission and body habitus. - Left ventricle: Systolic function is severely reduced, estimated EF 15-20%. The cavity size was severely dilated. Doppler parameters are consistent with abnormal left ventricular relaxation (grade 1 diastolic dysfunction). Doppler  parameters are consistent with high ventricular filling pressure. Medial E/e&' 24. - Regional wall motion abnormality: Akinesis of the basal-mid inferior and basal inferolateral myocardium; severe hypokinesis of the apical inferior and mid inferolateral myocardium. Severe, diffuse hypokinesis. - Ventricular septum: Septal motion showed abnormal function and dyssynergy. These changes are consistent with intraventricular conduction delay. - Aortic valve: Mildly calcified annulus. Trileaflet; mildly thickened leaflets. - Mitral valve: Mildly dilated annulus. Mildly thickened leaflets . There was moderate eccentric regurgitation. 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 - Left atrium: The atrium was severely dilated. Volume/bsa, S: 47.1 ml/m^2. - Right ventricle: Systolic function was mildly reduced. - Right atrium: The atrium was mildly dilated. - Atrial septum: There was increased thickness of the septum, consistent with lipomatous hypertrophy. - Tricuspid valve: There was mild regurgitation. - Pulmonary arteries: PA peak pressure: 42 mm Hg (S). Mildly elevated pulmonary pressures. - Pericardium, extracardiac: There was a small pericardial effusion, with no evidence of tamponade physiology.  04/2015 Carotid US IMPRESSION: 1. Mild (1-49%) stenosis of the proximal right internal carotid artery secondary to smooth heterogeneous atherosclerotic plaque. No interval change compared to recent prior imaging from earlier this month. 2. Mild (1-49%) stenosis of the proximal left internal carotid artery secondary to heterogeneous and irregular atherosclerotic plaque. No interval change compared to recent prior imaging from earlier this month. 3. Vertebral arteries remain patent with normal antegrade flow. Signed,    10/2015 echo Study Conclusions  - Left ventricle: The cavity size was moderately to severely dilated. Wall thickness was  normal. Systolic function was severely reduced. The estimated ejection fraction was in the range of 25% to 30%. Diffuse hypokinesis. There is akinesis of the basalanteroseptal and anterior myocardium. Doppler parameters are consistent with abnormal left ventricular relaxation (grade 1 diastolic dysfunction). - Mitral valve: Calcified annulus. Mildly calcified leaflets . There was mild regurgitation. - Right atrium: Central venous pressure (est): 3 mm Hg. - Atrial septum: No defect or patent foramen ovale was identified. - Tricuspid valve: There was trivial regurgitation. - Pulmonary arteries: PA peak pressure: 27 mm Hg (S). - Pericardium, extracardiac: A prominent pericardial fat pad was present.  Impressions:  - Moderate to severe LV chamber dilatation with LVEF approximately 25-30%. There is diffuse hypokinesis with near akinesis of the basal anteroseptal and anterior myocardium. Compared to the previous study from June 2016 there has been some improvement in LVEF. Grade 1 diastolic dysfunction. MAC with mild mitral regurgitation. Trivial tricuspid regurgitation with PASP 27 mmHg.  Transthoracic echocardiography. M-mode, complete 2D, spectral Doppler, and color Doppler. Birthdate: Patient birthdate: Sep 14, 1941. Age: Patient is 75 yr old. Sex: Gender:  female. BMI: 27.5 kg/m^2. Blood pressure:   98/47 Patient status: Inpatient. Study date: Study date: 10/04/2015. Study time: 11:27 AM. Location: Echo laboratory.      Assessment and Plan  1. CAD - no current symptoms - she is on high dose ASA due to her history of CVA - we will continue current meds  2. Chronic systolic HF - no current symptoms.  - continue current meds. She has not been interested in [employer]. Soft bp's today, no further med titration at this time.   3. Mitral regurgitation Mild by last echo, continue to monitor  4. Hyperlipidemia - continue high dose statin,  lipids at goal  5. CVA - continue to follow with neuro      Antoine Poche, M.D.

## 2016-10-16 ENCOUNTER — Telehealth: Payer: Self-pay | Admitting: Cardiology

## 2016-10-16 ENCOUNTER — Ambulatory Visit (INDEPENDENT_AMBULATORY_CARE_PROVIDER_SITE_OTHER): Payer: Medicare Other

## 2016-10-16 DIAGNOSIS — Z95810 Presence of automatic (implantable) cardiac defibrillator: Secondary | ICD-10-CM

## 2016-10-16 DIAGNOSIS — I5022 Chronic systolic (congestive) heart failure: Secondary | ICD-10-CM

## 2016-10-16 NOTE — Telephone Encounter (Signed)
LMOVM reminding pt to send remote transmission.   

## 2016-10-17 DIAGNOSIS — Z95810 Presence of automatic (implantable) cardiac defibrillator: Secondary | ICD-10-CM

## 2016-10-17 DIAGNOSIS — I5022 Chronic systolic (congestive) heart failure: Secondary | ICD-10-CM | POA: Diagnosis not present

## 2016-10-17 NOTE — Progress Notes (Signed)
EPIC Encounter for ICM Monitoring  Patient Name: Nicole Clements is a 75 y.o. female Date: 10/17/2016 Primary Care Physican: Lupita Raider, MD Primary Cardiologist:Branch Electrophysiologist: Graciela Husbands Dry Weight:147lb  Bi-V Pacing: 95.8%       Heart Failure questions reviewed, pt asymptomatic.  She stated she is feeling fine.    Thoracic impedance normal   Recommendations:  No changes.  Reinforced to limit low salt food choices to 2000 mg day and limiting fluid intake to < 2 liters per day. Encouraged to call for fluid symptoms.    Follow-up plan: ICM clinic phone appointment on 11/17/2016.  Copy of ICM check sent to device physician.   ICM trend: 10/16/2016       Karie Soda, RN 10/17/2016 1:48 PM

## 2016-10-30 DIAGNOSIS — E063 Autoimmune thyroiditis: Secondary | ICD-10-CM | POA: Diagnosis not present

## 2016-10-30 DIAGNOSIS — D649 Anemia, unspecified: Secondary | ICD-10-CM | POA: Diagnosis not present

## 2016-11-17 ENCOUNTER — Ambulatory Visit (INDEPENDENT_AMBULATORY_CARE_PROVIDER_SITE_OTHER): Payer: Medicare Other | Admitting: *Deleted

## 2016-11-17 DIAGNOSIS — I5022 Chronic systolic (congestive) heart failure: Secondary | ICD-10-CM

## 2016-11-17 DIAGNOSIS — Z95810 Presence of automatic (implantable) cardiac defibrillator: Secondary | ICD-10-CM | POA: Diagnosis not present

## 2016-11-17 DIAGNOSIS — I255 Ischemic cardiomyopathy: Secondary | ICD-10-CM

## 2016-11-17 NOTE — Progress Notes (Signed)
EPIC Encounter for ICM Monitoring  Patient Name: Nicole Clements is a 76 y.o. female Date: 11/17/2016 Primary Care Physican: Lupita Raider, MD Primary Cardiologist:Branch Electrophysiologist: Graciela Husbands Dry Weight:147lb  Bi-V Pacing: 96.8%        Heart Failure questions reviewed, pt asymptomatic   Thoracic impedance normal   Recommendations: No changes. Discussed limiting dietary salt intake to 2000 mg/day and fluid intake to < 2 liters per day. Encouraged to call for fluid symptoms.  Follow-up plan: ICM clinic phone appointment on 12/18/2016.  Copy of ICM check sent to device physician.   3 month ICM trend: 11/17/2016   1 Year ICM trend:      Karie Soda, RN 11/17/2016 10:11 AM

## 2016-11-18 NOTE — Progress Notes (Signed)
Remote ICD transmission.   

## 2016-11-21 LAB — CUP PACEART REMOTE DEVICE CHECK
Battery Remaining Longevity: 82 mo
Brady Statistic AS VP Percent: 5.45 %
Date Time Interrogation Session: 20180115062303
HIGH POWER IMPEDANCE MEASURED VALUE: 56 Ohm
Implantable Lead Implant Date: 20160615
Implantable Lead Location: 753858
Implantable Lead Location: 753859
Implantable Pulse Generator Implant Date: 20160615
Lead Channel Impedance Value: 304 Ohm
Lead Channel Impedance Value: 418 Ohm
Lead Channel Impedance Value: 475 Ohm
Lead Channel Impedance Value: 475 Ohm
Lead Channel Impedance Value: 551 Ohm
Lead Channel Impedance Value: 551 Ohm
Lead Channel Impedance Value: 551 Ohm
Lead Channel Impedance Value: 608 Ohm
Lead Channel Pacing Threshold Amplitude: 0.625 V
Lead Channel Pacing Threshold Pulse Width: 0.4 ms
Lead Channel Pacing Threshold Pulse Width: 0.4 ms
Lead Channel Pacing Threshold Pulse Width: 0.4 ms
Lead Channel Sensing Intrinsic Amplitude: 9 mV
Lead Channel Sensing Intrinsic Amplitude: 9 mV
Lead Channel Setting Pacing Amplitude: 1.5 V
Lead Channel Setting Pacing Amplitude: 1.75 V
Lead Channel Setting Pacing Amplitude: 2 V
Lead Channel Setting Pacing Pulse Width: 0.4 ms
Lead Channel Setting Pacing Pulse Width: 0.4 ms
Lead Channel Setting Sensing Sensitivity: 0.45 mV
MDC IDC LEAD IMPLANT DT: 20160615
MDC IDC LEAD IMPLANT DT: 20160615
MDC IDC LEAD LOCATION: 753860
MDC IDC MSMT BATTERY VOLTAGE: 2.99 V
MDC IDC MSMT LEADCHNL LV IMPEDANCE VALUE: 304 Ohm
MDC IDC MSMT LEADCHNL LV IMPEDANCE VALUE: 361 Ohm
MDC IDC MSMT LEADCHNL LV IMPEDANCE VALUE: 418 Ohm
MDC IDC MSMT LEADCHNL LV IMPEDANCE VALUE: 608 Ohm
MDC IDC MSMT LEADCHNL LV PACING THRESHOLD AMPLITUDE: 0.75 V
MDC IDC MSMT LEADCHNL RA IMPEDANCE VALUE: 418 Ohm
MDC IDC MSMT LEADCHNL RA SENSING INTR AMPL: 1.875 mV
MDC IDC MSMT LEADCHNL RA SENSING INTR AMPL: 1.875 mV
MDC IDC MSMT LEADCHNL RV PACING THRESHOLD AMPLITUDE: 0.5 V
MDC IDC STAT BRADY AP VP PERCENT: 92.73 %
MDC IDC STAT BRADY AP VS PERCENT: 1.68 %
MDC IDC STAT BRADY AS VS PERCENT: 0.14 %
MDC IDC STAT BRADY RA PERCENT PACED: 93.6 %
MDC IDC STAT BRADY RV PERCENT PACED: 23.62 %

## 2016-11-26 ENCOUNTER — Encounter: Payer: Self-pay | Admitting: Cardiology

## 2016-12-18 ENCOUNTER — Ambulatory Visit (INDEPENDENT_AMBULATORY_CARE_PROVIDER_SITE_OTHER): Payer: Medicare Other

## 2016-12-18 ENCOUNTER — Other Ambulatory Visit: Payer: Self-pay | Admitting: Cardiology

## 2016-12-18 ENCOUNTER — Telehealth: Payer: Self-pay

## 2016-12-18 DIAGNOSIS — I5022 Chronic systolic (congestive) heart failure: Secondary | ICD-10-CM

## 2016-12-18 DIAGNOSIS — Z95810 Presence of automatic (implantable) cardiac defibrillator: Secondary | ICD-10-CM

## 2016-12-18 NOTE — Telephone Encounter (Signed)
Remote ICM transmission received.  Attempted patient call and left detailed message regarding transmission and next ICM scheduled for 01/20/2017.  Advised to return call for any fluid symptoms or questions.    

## 2016-12-18 NOTE — Progress Notes (Signed)
EPIC Encounter for ICM Monitoring  Patient Name: LUCETTA RINE is a 76 y.o. female Date: 12/18/2016 Primary Care Physican: Lupita Raider, MD Primary Cardiologist:Branch Electrophysiologist: Candie Echevaria Weight:[place] Bi-V Pacing: 96.1%       Attempted call to patient and unable to reach.  Left detailed message regarding transmission.  Transmission reviewed.    Thoracic impedance normal   Current prescribed dose of Furosemide 80 mg 1 tablet daily and Potassium 20 mEq 1 tablet daily  Recommendations: Left voice mail with ICM number and encouraged to call for fluid symptoms.  Follow-up plan: ICM clinic phone appointment on 01/20/2017.  Copy of ICM check sent to device physician.   3 month ICM trend: 12/18/2016   1 Year ICM trend:      Karie Soda, RN 12/18/2016 8:23 AM

## 2017-01-09 IMAGING — US US CAROTID DUPLEX BILAT
1 series · 13 of 24 positions shown · non-contrast
Comparison: None.

CLINICAL DATA: Carotid bruits. Hypertension, coronary artery
disease, tobacco abuse.

EXAM:
BILATERAL CAROTID DUPLEX ULTRASOUND
TECHNIQUE: Gray scale imaging, color Doppler and duplex ultrasound was
performed of bilateral carotid and vertebral arteries in the neck.

[Series 1: us carotid duplex bilat · 0.05mm/px · 13 of 122 slices shown]
[im 1/122]
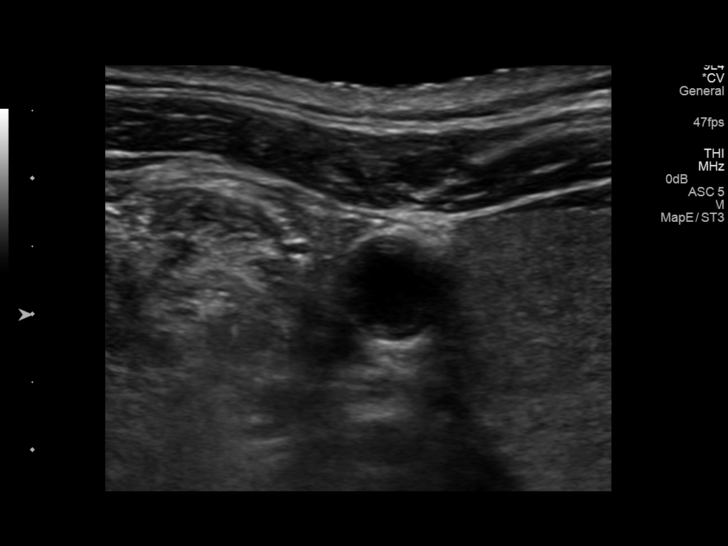
[im 11/122]
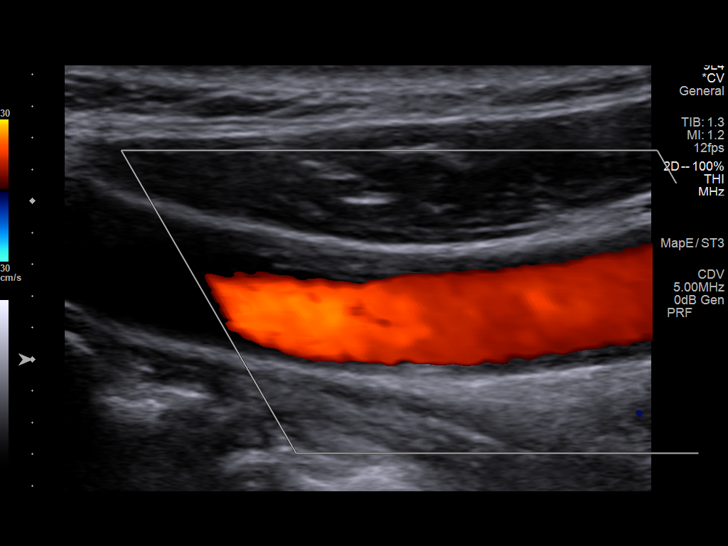
[im 22/122]
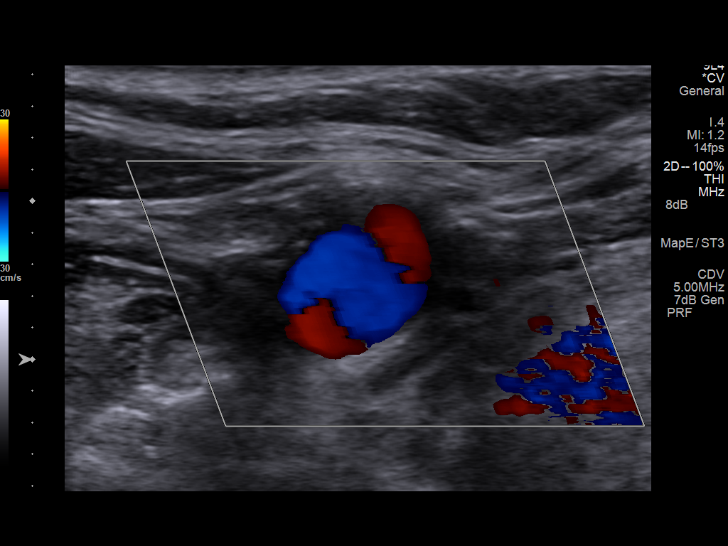
[im 32/122]
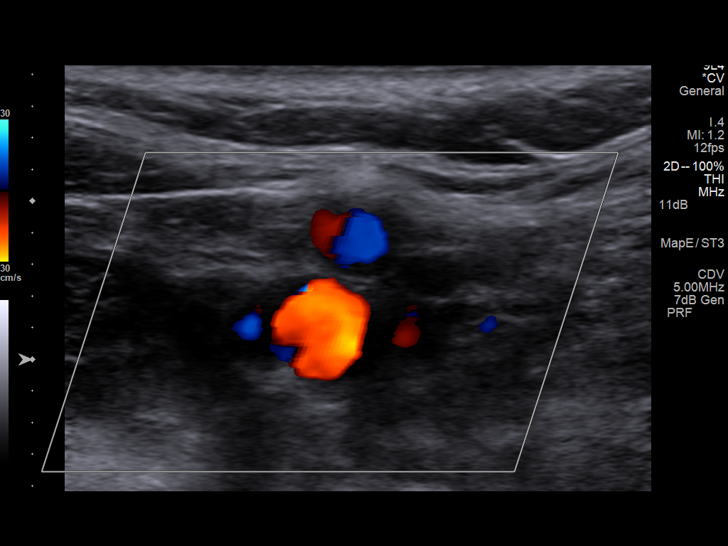
[im 43/122]
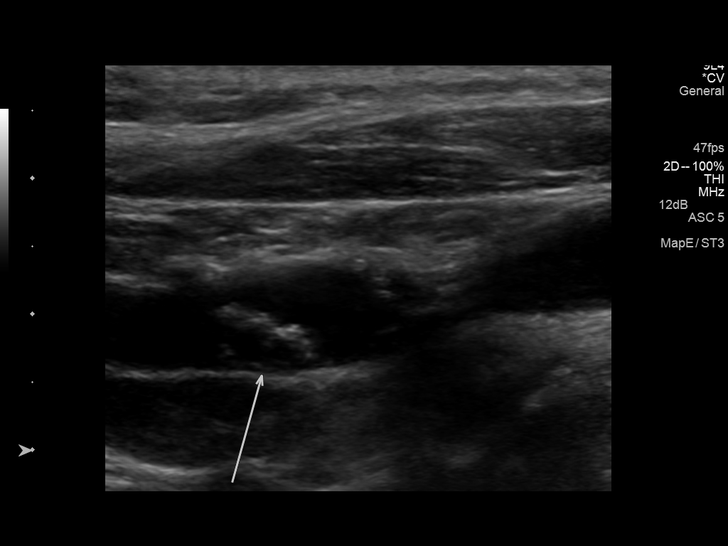
[im 53/122]
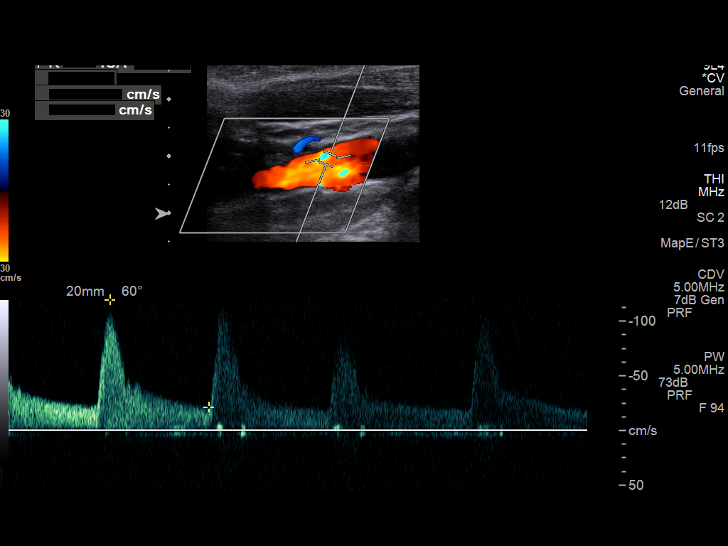
[im 64/122]
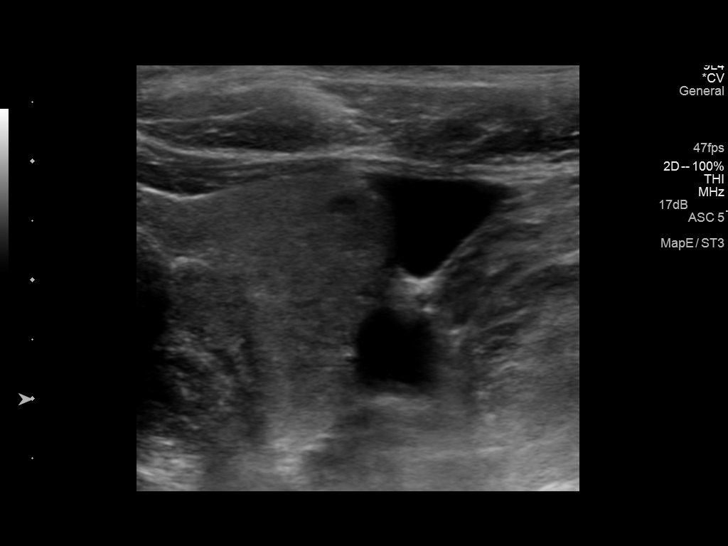
[im 69/122]
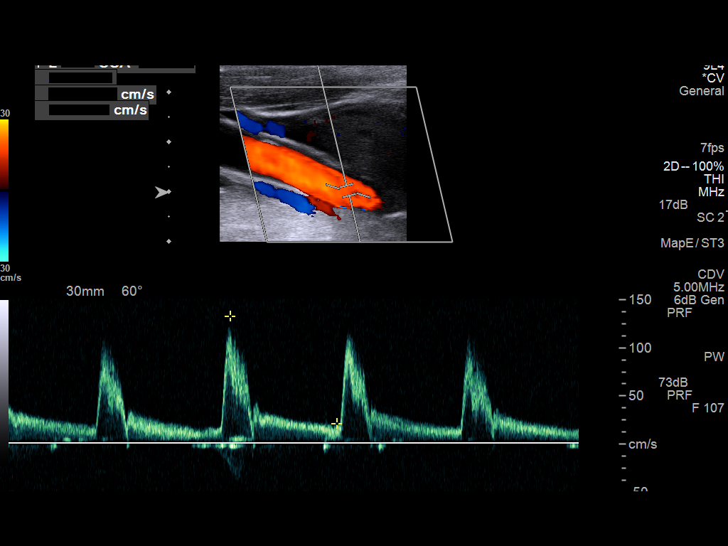
[im 79/122]
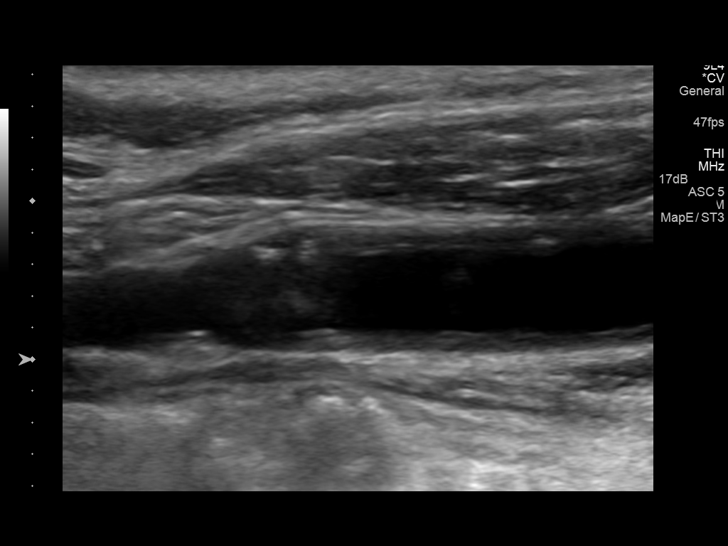
[im 90/122]
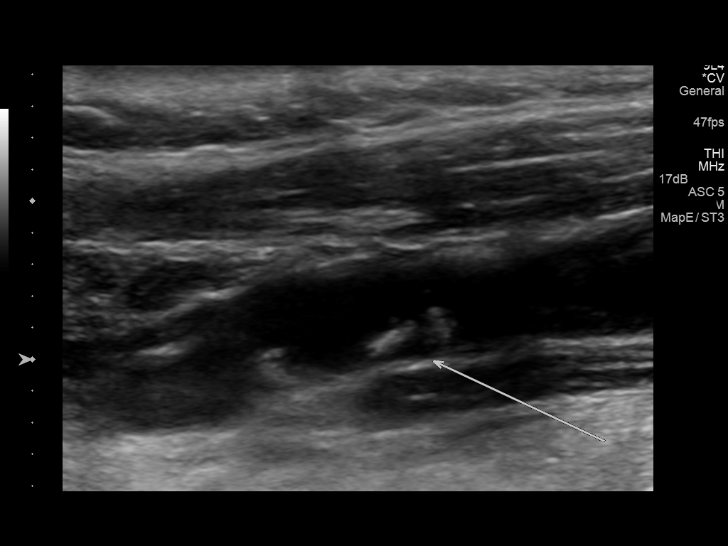
[im 100/122]
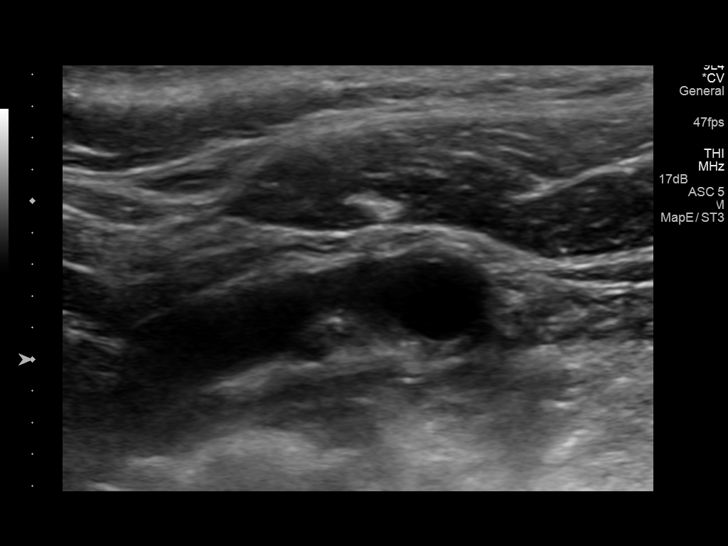
[im 111/122]
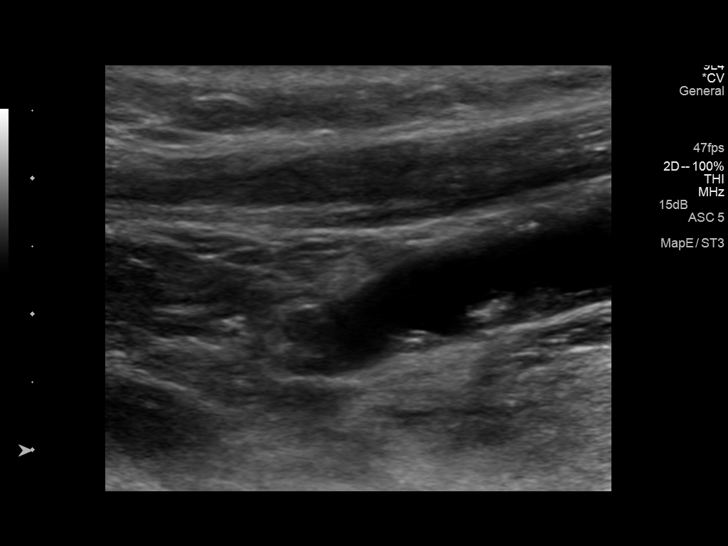
[im 122/122]
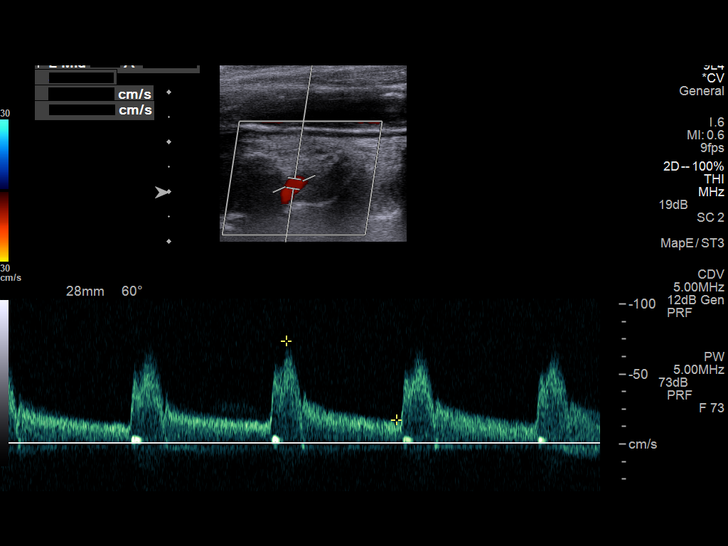

[13 of 24 positions shown; findings below may reference images not displayed]

REVIEW OF SYSTEMS:
Quantification of carotid stenosis is based on velocity parameters
that correlate the residual internal carotid diameter with
NASCET-based stenosis levels, using the diameter of the distal
internal carotid lumen as the denominator for stenosis measurement.

The following velocity measurements were obtained:

PEAK SYSTOLIC/END DIASTOLIC

RIGHT

ICA:                     120/22cm/sec

CCA:                     89 /12cm/sec

SYSTOLIC ICA/CCA RATIO:

DIASTOLIC ICA/CCA RATIO:

ECA:                     142cm/sec

LEFT

ICA:                     122/31cm/sec

CCA:                     120 /21cm/sec

SYSTOLIC ICA/CCA RATIO:

DIASTOLIC ICA/CCA RATIO:

ECA:                     143cm/sec
FINDINGS: RIGHT CAROTID ARTERY: Diffuse intimal thickening through the common
carotid artery and bulb. Mild eccentric partially calcified plaque
in the proximal ICA without high-grade stenosis. Normal waveforms
and color Doppler signal.

RIGHT VERTEBRAL ARTERY:  Normal flow direction and waveform.

LEFT CAROTID ARTERY: Scattered plaque through the common carotid
artery without significant stenosis. Eccentric somewhat irregular
and partially calcified plaque in the carotid bulb extending into
the proximal ICA, without high-grade stenosis. Normal waveforms and
color Doppler signal.

LEFT VERTEBRAL ARTERY: Normal flow direction and waveform.
IMPRESSION: 1. Bilateral carotid bifurcation and proximal ICA plaque, left
greater than right, resulting in less than 50% diameter stenosis.
The exam does not exclude plaque ulceration or embolization.
Continued surveillance recommended.

## 2017-01-20 ENCOUNTER — Ambulatory Visit (INDEPENDENT_AMBULATORY_CARE_PROVIDER_SITE_OTHER): Payer: Medicare Other

## 2017-01-20 DIAGNOSIS — I5022 Chronic systolic (congestive) heart failure: Secondary | ICD-10-CM | POA: Diagnosis not present

## 2017-01-20 DIAGNOSIS — Z95810 Presence of automatic (implantable) cardiac defibrillator: Secondary | ICD-10-CM | POA: Diagnosis not present

## 2017-01-20 NOTE — Progress Notes (Signed)
EPIC Encounter for ICM Monitoring  Patient Name: Nicole Clements is a 76 y.o. female Date: 01/20/2017 Primary Care Physican: Mayra Neer, MD Primary Cardiologist:Branch Electrophysiologist: Caryl Comes Dry Weight: 148 lbs Bi-V Pacing: 96.7%          Heart Failure questions reviewed, pt asymptomatic.   Thoracic impedance normal.  Current prescribed dose of Furosemide 80 mg 1 tablet daily and Potassium 20 mEq 1 tablet daily  Lab 07/22/2016 Creatinine 1.30, BUN 12, Potassium 4.6, Sodium 138, EGFR 40-49  Recommendations: No changes. Reminded to limit dietary salt intake to 2000 mg/day and fluid intake to < 2 liters/day. Encouraged to call for fluid symptoms.  Follow-up plan: ICM clinic phone appointment on 02/23/2017.  Copy of ICM check sent to device physician.   3 month ICM trend: 30/20/2018   1 Year ICM trend:      Nicole Billings, RN 01/20/2017 9:10 AM

## 2017-02-09 ENCOUNTER — Ambulatory Visit (INDEPENDENT_AMBULATORY_CARE_PROVIDER_SITE_OTHER): Payer: Medicare Other | Admitting: Cardiology

## 2017-02-09 ENCOUNTER — Other Ambulatory Visit: Payer: Self-pay | Admitting: Cardiology

## 2017-02-09 VITALS — BP 120/70 | HR 90 | Ht 62.0 in | Wt 153.0 lb

## 2017-02-09 DIAGNOSIS — I340 Nonrheumatic mitral (valve) insufficiency: Secondary | ICD-10-CM

## 2017-02-09 DIAGNOSIS — E782 Mixed hyperlipidemia: Secondary | ICD-10-CM | POA: Diagnosis not present

## 2017-02-09 DIAGNOSIS — I2510 Atherosclerotic heart disease of native coronary artery without angina pectoris: Secondary | ICD-10-CM

## 2017-02-09 DIAGNOSIS — I5022 Chronic systolic (congestive) heart failure: Secondary | ICD-10-CM | POA: Diagnosis not present

## 2017-02-09 DIAGNOSIS — I255 Ischemic cardiomyopathy: Secondary | ICD-10-CM

## 2017-02-09 LAB — COMPREHENSIVE METABOLIC PANEL WITH GFR
ALT: 4 U/L — ABNORMAL LOW (ref 6–29)
AST: 11 U/L (ref 10–35)
Albumin: 3.8 g/dL (ref 3.6–5.1)
Alkaline Phosphatase: 63 U/L (ref 33–130)
BUN: 11 mg/dL (ref 7–25)
CO2: 25 mmol/L (ref 20–31)
Calcium: 9.1 mg/dL (ref 8.6–10.4)
Chloride: 107 mmol/L (ref 98–110)
Creat: 1.22 mg/dL — ABNORMAL HIGH (ref 0.60–0.93)
Glucose, Bld: 91 mg/dL (ref 65–99)
Potassium: 4.2 mmol/L (ref 3.5–5.3)
Sodium: 138 mmol/L (ref 135–146)
Total Bilirubin: 0.6 mg/dL (ref 0.2–1.2)
Total Protein: 6.3 g/dL (ref 6.1–8.1)

## 2017-02-09 MED ORDER — SACUBITRIL-VALSARTAN 24-26 MG PO TABS: 1.0000 | ORAL_TABLET | Freq: Two times a day (BID) | ORAL | 6 refills | Status: DC

## 2017-02-09 NOTE — Progress Notes (Signed)
Clinical Summary Nicole Clements is a 76 y.o.female seen today for follow up of the following medical problems.   1. 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 Admit 07/2014 with CHF and troponin elevation - cath 07/2014, LM 30% distal, LAD mid 50%, LCX ostial 50% and mid 50%, small intermediate Nicole Clements 70%. RCA diffuse 50%, distal RCA with ulcerated 90% lesion, PDA 80-90% too small for PCI. LVEF 30%. Received DES to distal RCA, post procedure the PDA became occluded.  - echo 07/2014 LVEF 20%, inferior akinesis, grade II diastolic dysfunction. Repeat echo 10/2014 LVEF remains 15-20%.  - 04/2015 CRT-D device placed by Nicole Clements. Normal function Jan 2018.  10/2015 echo LVEF 25-30%    - denies any LE edema. Home scales stable around 150 lbs. No SOB/DOE.  - compliant with meds. Limiting sodium intake. Avoiding NSAIDs - compliant with meds. Takes lasix prn.   2. Mitral regurgitation - mild by most recent echo - she denies any recent symptoms.   3. Hyperlipidemia - she is compliant with pravastatin 57m daily - 07/2016 TC 102 HDL 40 TG 88 LDL 45    [place]    Past Medical History:  Diagnosis Date  . CAD (coronary artery disease) April 2014   DES to PLA, occluded PDA 07/2014  . CHF ([place]estive heart failure) ([facility]   . Hypercholesterolemia   . Hypertension   . Ischemic cardiomyopathy    LVEF 25%-45%  . IVCD (intraventricular conduction defect)   . PVC's (premature ventricular contractions)   . S/P colonoscopy August 2007   Hyperplastic polyps, rare sigmoid and descending colon diverticulosis, small internal hemorrhoids  . STEMI (ST elevation myocardial infarction) [place] 07/22/1996     Allergies  Allergen Reactions  . Lactose Intolerance (Gi) Other (See Comments)    GI Upset   . Sulfa Antibiotics Rash     Current Outpatient Prescriptions  Medication Sig Dispense Refill  . acetaminophen (TYLENOL) 325 MG  tablet Take 2 tablets (650 mg total) by mouth every 4 (four) hours as needed for headache or mild pain.    .Marland Kitchenaspirin EC 81 MG tablet Take 1 tablet (81 mg total) by mouth daily. 90 tablet 3  . atorvastatin (LIPITOR) 80 MG tablet Take 1 tablet (80 mg total) by mouth daily. 90 tablet 3  . carvedilol (COREG) 25 MG tablet take 1 tablet by mouth twice a day 60 tablet 6  . Cholecalciferol (VITAMIN D3) 2000 UNITS capsule Take 2,000 Units by mouth daily.      . furosemide (LASIX) 80 MG tablet take 1 tablet by mouth once daily 30 tablet 3  . losartan (COZAAR) 25 MG tablet take 1 tablet by mouth once daily 90 tablet 3  . nitroGLYCERIN (NITROSTAT) 0.4 MG SL tablet Place 1 tablet (0.4 mg total) under the tongue every 5 (five) minutes x 3 doses as needed for chest pain. 25 tablet 2  . Omega-3 Fatty Acids (FISH OIL) 1000 MG CAPS Take 1,000 mg by mouth daily.     . potassium chloride SA (K-DUR,KLOR-CON) 20 MEQ tablet take 1 tablet by mouth once daily 90 tablet 2  . spironolactone (ALDACTONE) 25 MG tablet Take 0.5 tablet by mouth twice daily    . spironolactone (ALDACTONE) 25 MG tablet take 1/2 tablet twice a day 30 tablet 1   No current facility-administered medications for this visit.      Past Surgical History:  Procedure Laterality Date  .  CARDIAC CATHETERIZATION  April 2014   Med Rx  . COLONOSCOPY N/A 12/24/2015   Nicole. Fields:moderate sized internal hemorrhoids/moderate diverticulosis, negative microscopic colitis   . EP IMPLANTABLE DEVICE  04/18/2015   BV ICD  . EP IMPLANTABLE DEVICE N/A 04/18/2015   Procedure: BiV ICD Insertion CRT-D;  Surgeon: Nicole Sprang, MD;  Location: [facility];  Service: Cardiovascular;  Laterality: N/A;  . LEFT AND RIGHT HEART CATHETERIZATION WITH CORONARY ANGIOGRAM N/A 07/17/2014   Procedure: LEFT AND RIGHT HEART CATHETERIZATION WITH CORONARY ANGIOGRAM;  Surgeon: Nicole Ohara, MD;  Location: [facility];  Service: Cardiovascular;  Laterality: N/A;  .  PERCUTANEOUS CORONARY STENT INTERVENTION (PCI-S)  07/17/2014   Procedure: PERCUTANEOUS CORONARY STENT INTERVENTION (PCI-S);  Surgeon: Nicole Ohara, MD;  Location: [facility];  Service: Cardiovascular;;  Distal RCA  . S/P Hysterectomy       Allergies  Allergen Reactions  . Lactose Intolerance (Gi) Other (See Comments)    GI Upset   . Sulfa Antibiotics Rash      Family History  Problem Relation Age of Onset  . Hypertension Sister   . Diabetes Mellitus II Sister   . Hypertension Brother   . Diabetes Mellitus II Brother   . Hypertension Brother   . Diabetes Mellitus II Brother   . Hypertension Brother   . Diabetes Mellitus II Brother   . Colon cancer Neg Hx      Social History Nicole Clements reports that she has been smoking Cigarettes.  She started smoking about 54 years ago. She has a 25.00 pack-year smoking history. She has never used smokeless tobacco. Nicole Clements reports that she does not drink alcohol.   Review of Systems CONSTITUTIONAL: No weight loss, fever, chills, weakness or fatigue.  HEENT: Eyes: No visual loss, blurred vision, double vision or yellow sclerae.No hearing loss, sneezing, congestion, runny nose or sore throat.  SKIN: No rash or itching.  CARDIOVASCULAR: per HPI RESPIRATORY: No shortness of breath, cough or sputum.  GASTROINTESTINAL: No anorexia, nausea, vomiting or diarrhea. No abdominal pain or blood.  GENITOURINARY: No burning on urination, no polyuria NEUROLOGICAL: No headache, dizziness, syncope, paralysis, ataxia, numbness or tingling in the extremities. No change in bowel or bladder control.  MUSCULOSKELETAL: No muscle, back pain, joint pain or stiffness.  LYMPHATICS: No enlarged nodes. No history of splenectomy.  PSYCHIATRIC: No history of depression or anxiety.  ENDOCRINOLOGIC: No reports of sweating, cold or heat intolerance. No polyuria or polydipsia.  [name]   Physical Examination Vitals:   02/09/17 0959  BP: 120/70  Pulse: 90    Vitals:   02/09/17 0959  Weight: 153 lb (69.4 kg)  Height: 5' 2"  (1.575 m)    Gen: resting comfortably, no acute distress HEENT: no scleral icterus, pupils equal round and reactive, no palptable cervical adenopathy,  CV: RRR, no m/r/g, no jvd Resp: Clear to auscultation bilaterally GI: abdomen is soft, non-tender, non-distended, normal bowel sounds, no hepatosplenomegaly MSK: extremities are warm, no edema.  Skin: warm, no rash Neuro:  no focal deficits Psych: appropriate affect   Diagnostic Studies 02/2013 [facility] Procedural Findings:  Hemodynamics:  AO 116/45 with a mean of 70  LV 120/18  Coronary angiography:  Coronary dominance: right  Left mainstem: The left main is patent with 30% distal left main stenosis as the vessel trifurcates into the LAD, intermediate Airi Copado, and left circumflex.  Left anterior descending (LAD): The LAD is patent with diffuse disease noted. There is minimal calcification present. The  first diagonal is patent with 30-50% diffuse disease. The LAD after the first septal perforator has 40-50% stenosis. The vessel reaches the apex and wraps around the left ventricular apex without significant stenosis.  Left circumflex (LCx): The intermediate Amberrose Friebel is small in caliber and diffusely diseased with 50-60% proximal vessel stenosis the AV groove circumflex is diffusely diseased with 50% ostial stenosis and 30% mid stenosis it supplies 2 small posterolateral Hilary Pundt  Right coronary artery (RCA): The RCA is diffusely diseased. There is significant tortuosity, especially around the junction of the mid and distal vessel. The vessel is diffusely calcified. The mid vessel has tandem 50% stenoses. The distal vessel has 80-90 % stenosis just before the bifurcation of the PDA and posterior AV segment. The PDA and posterior AV segment with 3 posterolateral branches are patent.  Left ventriculography: Left ventricular systolic function is moderately depressed. There  is severe hypokinesis of the basal and midinferior walls. The anterolateral and apical walls contract normally. The left ventricular ejection fraction is estimated at 45%.  Abdominal aortogram: The abdominal aorta is patent. The renal arteries are single bilaterally and they are widely patent. There appears to be nonobstructive stenosis of the right common iliac artery at the aortoiliac junction. The left common iliac artery appears to have significant ostial stenosis estimated at at least 75% angiographically. The visualized portions of the external iliac and common femoral arteries are patent  Final Conclusions:  1. Severe single-vessel coronary artery disease involving the distal right coronary artery 2. Moderate LV dysfunction with segmental severe hypokinesis of the inferior wall and normal contraction of the antero-apex with estimated left ventricular ejection fraction of 45%. 3. Severe left common iliac artery stenosis Recommendations: The patient has severe distal RCA disease. She has a known history of inferior wall infarction and remote PCA. This fits with that clinical history. She has nonobstructive disease of the left coronary artery. Her left ventricular ejection fraction by ventriculography is clearly greater than 35%. With her history of previous MI, I do not think PCI is indicated with an absence of angina. Will carefully discussed symptoms with the patient, but I am inclined to treat her medically. Will also review indication for consideration of peripheral intervention for her iliac disease.   01/2013 Echo Study Conclusions  - Left ventricle: The cavity size was mildly to moderately dilated. Wall thickness was normal. Systolic function was severely reduced. The estimated ejection fraction was 25%. Severe diffuse hypokinesis. - Aortic valve: Mildly calcified annulus. Trileaflet. - Mitral valve: Calcified annulus. - Left atrium: The atrium was moderately dilated. -  Right ventricle: The cavity size was normal. Wall thickness was mildly to moderately increased. - Right atrium: The atrium was mildly dilated. - Atrial septum: No defect or patent foramen ovale was identified. Impressions:  - Compared to the prior study of 03/13/11, there has been substantial deterioration in LV systolic function.  07/2014 Echo Study Conclusions  - Left ventricle: The cavity size was severely dilated. Wall thickness was normal. The estimated ejection fraction was 20%. Diffuse hypokinesis. There is akinesis of the inferolateral and inferior myocardium. Features are consistent with a pseudonormal left ventricular filling pattern, with concomitant abnormal relaxation and increased filling pressure (grade 2 diastolic dysfunction). Doppler parameters are consistent with high ventricular filling pressure. - Mitral valve: There was mild to moderate regurgitation. - Left atrium: The atrium was moderately dilated.  Impressions:  - Global hypokinesis; inferior and inferolateral akinesis; overall severely reduced LV function; grade 2 diastolic dysfunction with elevated left ventricular  filling pressures, moderate LAE; mild to moderate MR. 07/2014 Cath PROCEDURAL FINDINGS  Hemodynamics:  AO 122/48  LV 118/14  RA 9  RV 41/11  PA 37/5 with a mean of 21  PCWP 27  Oxygen saturations  AO 90  PA 56  Cardiac output 3.5  Cardiac index 2.0  Coronary angiography:  Coronary dominance: right  Left mainstem: The left mainstem is patent with 30% distal left main stenosis.  Left anterior descending (LAD): The LAD is diffusely diseased in the midportion. There are sequential 50% stenoses, unchanged from previous study. The first diagonal Anay Walter is large in distribution with diffuse nonobstructive disease noted. The mid and distal LAD are patent without significant disease.  Left circumflex (LCx): The left circumflex has 50% ostial stenosis, 50% mid vessel  stenosis, and to OM branches with no significant disease. There is an intermediate Jessamine Barcia is diffusely diseased with up to 70% stenosis. This vessel is small in caliber.  Right coronary artery (RCA): Dominant vessel. Tortuous with diffuse nonobstructive stenosis throughout the proximal and mid portions. There are diffuse 50% stenosis present. The distal vessel has an ulcerated-appearing irregular 90% stenosis involving the bifurcation of the PDA and posterolateral branches. The posterolateral branches are patent with mild diffuse disease. The PDA arises an acute angulation with 30-40% stenosis. The mid body of the PDA has an 80-90% stenosis in an area where the vessel is too small for PCI.  Left ventriculography: The ventricle appears the synchronous. The basal and midinferior wall are akinetic. The basal inferolateral wall is severely hypokinetic. The anterolateral wall and apex contract normally. The estimated LVEF is 30%.  PCI Procedure Note: Following the diagnostic procedure, the decision was made to proceed with PCI of the distal RCA. The lesion is complex, there is a large amount of myocardium involved. I felt there was a risk of potentially compromising the PDA Kyleeann Cremeans, but overall in my opinion the risk/benefit was favorable for PCI considering the severe stenosis involving the entire RCA distribution. The sheath was upsized to a 6 [place]. Weight-based bivalirudin was given for anticoagulation. The patient was loaded with Plavix 600 mg on the table. Once a therapeutic ACT was achieved, a 6 [place] JR 4 guide catheter was inserted. A cougar coronary guidewire was used to cross the lesion into the PLA Mariadel Mruk. The lesion was predilated with a 2.5 x 15 mm balloon. The lesion was then stented with a 3.0 by 44m Promus DES stent. The stent was postdilated with a 3.25 mm noncompliant balloon to 18 atmospheres. The PDA was stented across as the stent extended from the distal RCA proximal PLA Ezzie Senat. The PDA was  totally occluded. I made a long attempt with a whisper wire using multiple bands on the tip of the wire, but all of these attempts were unsuccessful at rewiring the PDA. The patient was completely asymptomatic and specifically denied any chest pain. Her hemodynamics remained stable and there were no changes in her rhythm or blood pressure. A final image of the left coronary artery was obtained and this demonstrated a collateral from the LAD to the PDA Cleva Camero. Following PCI, there was 0% residual stenosis and TIMI-3 flow. Final angiography confirmed an excellent result. Femoral hemostasis was achieved will be achieved with manual hemostasis. The patient tolerated the PCI procedure well. There were no immediate procedural complications. The patient was transferred to the post catheterization recovery area for further monitoring.  PCI Data:  Vessel - RCA/Segment - distal  Percent Stenosis (pre) 90  TIMI-flow  3  Stent 3.0 by 47m Promus DES  Percent Stenosis (post) 0  TIMI-flow (post) 3  Contrast: 175 cc Omnipaque  Radiation dose/Fluoro time: 15.5 minutes  Estimated Blood Loss: Minimal  Final Conclusions:  1. Nonobstructive disease involving the LAD, left circumflex, and OM/diagonal side branches 2. Severe stenosis of the distal RCA bifurcation, treated successfully with a drug-eluting stent into the PLA Alanis Clift with residual occlusion of the PDA Huxton Glaus 3. Severe segmental LV systolic dysfunction Recommendations: Dual antiplatelet therapy with aspirin and Plavix for at least 12 months. Will cycle cardiac enzymes since the PDA Kyleigha Markert is occluded after stenting.  10/2014 Echo Study Conclusions  - Procedure narrative: Transthoracic echocardiography. Image quality was suboptimal. The study was technically difficult, as a result of poor sound wave transmission and body habitus. - Left ventricle: Systolic function is severely reduced, estimated EF 15-20%. The cavity size was  severely dilated. Doppler parameters are consistent with abnormal left ventricular relaxation (grade 1 diastolic dysfunction). Doppler parameters are consistent with high ventricular filling pressure. Medial E/e&' 24. - Regional wall motion abnormality: Akinesis of the basal-mid inferior and basal inferolateral myocardium; severe hypokinesis of the apical inferior and mid inferolateral myocardium. Severe, diffuse hypokinesis. - Ventricular septum: Septal motion showed abnormal function and dyssynergy. These changes are consistent with intraventricular conduction delay. - Aortic valve: Mildly calcified annulus. Trileaflet; mildly thickened leaflets. - Mitral valve: Mildly dilated annulus. Mildly thickened leaflets . There was moderate eccentric regurgitation. 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 - Left atrium: The atrium was severely dilated. Volume/bsa, S: 47.1 ml/m^2. - Right ventricle: Systolic function was mildly reduced. - Right atrium: The atrium was mildly dilated. - Atrial septum: There was increased thickness of the septum, consistent with lipomatous hypertrophy. - Tricuspid valve: There was mild regurgitation. - Pulmonary arteries: PA peak pressure: 42 mm Hg (S). Mildly elevated pulmonary pressures. - Pericardium, extracardiac: There was a small pericardial effusion, with no evidence of tamponade physiology.  04/2015 Carotid [place]PRESS[place]: 1. Mild (1-49%) stenosis of the proximal right internal carotid artery secondary to smooth heterogeneous atherosclerotic plaque. No interval change compared to recent prior imaging from earlier this month. 2. Mild (1-49%) stenosis of the proximal left internal carotid artery secondary to heterogeneous and irregular atherosclerotic plaque. No interval change compared to recent prior imaging from earlier this month. 3. Vertebral arteries remain patent with normal antegrade  flow. Signed,    10/2015 echo Study Conclusions  - Left ventricle: The cavity size was moderately to severely dilated. Wall thickness was normal. Systolic function was severely reduced. The estimated ejection fraction was in the range of 25% to 30%. Diffuse hypokinesis. There is akinesis of the basalanteroseptal and anterior myocardium. Doppler parameters are consistent with abnormal left ventricular relaxation (grade 1 diastolic dysfunction). - Mitral valve: Calcified annulus. Mildly calcified leaflets . There was mild regurgitation. - Right atrium: Nicole venous pressure (est): 3 mm Hg. - Atrial septum: No defect or patent foramen ovale was identified. - Tricuspid valve: There was trivial regurgitation. - Pulmonary arteries: PA peak pressure: 27 mm Hg (S). - Pericardium, extracardiac: A prominent pericardial fat pad was present.  Impressions:  - Moderate to severe LV chamber dilatation with LVEF approximately 25-30%. There is diffuse hypokinesis with near akinesis of the basal anteroseptal and anterior myocardium. Compared to the previous study from June 2016 there has been some improvement in LVEF. Grade 1 diastolic dysfunction. MAC with mild mitral regurgitation. Trivial tricuspid regurgitation with PASP 27 mmHg.  Transthoracic echocardiography. M-mode, complete 2D, spectral  Doppler, and color Doppler. Birthdate: Patient birthdate: February 14, 1941. Age: Patient is 76 yr old. Sex: Gender: female. BMI: 27.5 kg/m^2. Blood pressure:   98/47 Patient status: Inpatient. Study date: Study date: 10/04/2015. Study time: 11:27 AM. Location: Echo laboratory.       Assessment and Plan  1. CAD - no current symptoms - she is on high dose ASA due to her history of CVA - she will continue current meds  2. Chronic systolic HF - no current symptoms.  - we will start low dose entresto today 24/47m bid, d/c losartan.  - repeat  BMET/Mg   3. Mitral regurgitation Mild by last echo, no recent symptoms - continue to monitor.   4. Hyperlipidemia - continue high dose statin - [facility]   5. History of CVA - continue secondary prevention       JArnoldo Lenis M.D., F.A.C.C.

## 2017-02-09 NOTE — Patient Instructions (Signed)
Your physician recommends that you schedule a follow-up appointment in: 3 Months with Dr. Wyline Mood  Your physician has recommended you make the following change in your medication:  Stop taking Losartan   Start Taking Entresto 24/26 Two Times Daily   Your physician recommends that you return for lab work in: Today   If you need a refill on your cardiac medications before your next appointment, please call your pharmacy.  Thank you for choosing [facility]!

## 2017-02-10 LAB — MAGNESIUM: Magnesium: 1.8 mg/dL (ref 1.5–2.5)

## 2017-02-11 ENCOUNTER — Other Ambulatory Visit: Payer: Self-pay | Admitting: Internal Medicine

## 2017-02-11 ENCOUNTER — Encounter: Payer: Self-pay | Admitting: Cardiology

## 2017-02-16 ENCOUNTER — Other Ambulatory Visit: Payer: Self-pay | Admitting: Cardiology

## 2017-02-23 ENCOUNTER — Ambulatory Visit (INDEPENDENT_AMBULATORY_CARE_PROVIDER_SITE_OTHER): Payer: Medicare Other | Admitting: *Deleted

## 2017-02-23 DIAGNOSIS — Z95810 Presence of automatic (implantable) cardiac defibrillator: Secondary | ICD-10-CM

## 2017-02-23 DIAGNOSIS — I255 Ischemic cardiomyopathy: Secondary | ICD-10-CM

## 2017-02-23 DIAGNOSIS — I5022 Chronic systolic (congestive) heart failure: Secondary | ICD-10-CM | POA: Diagnosis not present

## 2017-02-23 NOTE — Progress Notes (Signed)
EPIC Encounter for ICM Monitoring  Patient Name: Nicole Clements is a 76 y.o. female Date: 02/23/2017 Primary Care Physican: Mayra Neer, MD Primary Cardiologist:Branch Electrophysiologist: Caryl Comes Dry Weight: 147 lbs Bi-V Pacing: 96.4%      Heart Failure questions reviewed, pt asymptomatic    Thoracic impedance normal   Current prescribed dose of Furosemide 80 mg 1 tablet daily and Potassium 20 mEq 1 tablet daily  Lab 02/09/2017 Creatinine 1.22, BUN 11, Potassium 4.2, Sodium 138 07/22/2016 Creatinine 1.30, BUN 12, Potassium 4.6, Sodium 138, EGFR 40-49  Recommendations: No changes. Discussed to limit salt intake to 2000 mg/day and fluid intake to < 2 liters/day.  Encouraged to call for fluid symptoms.  Follow-up plan: ICM clinic phone appointment on 03/26/2017.    Copy of ICM check sent to device physician.   3 month ICM trend: 02/23/2017   1 Year ICM trend:      Rosalene Billings, RN 02/23/2017 11:42 AM

## 2017-02-23 NOTE — Progress Notes (Signed)
Remote ICD transmission.   

## 2017-02-26 ENCOUNTER — Encounter: Payer: Self-pay | Admitting: Cardiology

## 2017-02-26 LAB — CUP PACEART REMOTE DEVICE CHECK
Battery Remaining Longevity: 79 mo
Battery Voltage: 2.99 V
Brady Statistic AP VP Percent: 96.72 %
Brady Statistic AP VS Percent: 1.6 %
Brady Statistic AS VS Percent: 0.06 %
Brady Statistic RV Percent Paced: 24.45 %
Date Time Interrogation Session: 20180423062504
HIGH POWER IMPEDANCE MEASURED VALUE: 61 Ohm
Implantable Lead Implant Date: 20160615
Implantable Lead Location: 753859
Implantable Lead Model: 4398
Implantable Lead Model: 5076
Lead Channel Impedance Value: 342 Ohm
Lead Channel Impedance Value: 361 Ohm
Lead Channel Impedance Value: 418 Ohm
Lead Channel Impedance Value: 532 Ohm
Lead Channel Impedance Value: 589 Ohm
Lead Channel Pacing Threshold Amplitude: 0.5 V
Lead Channel Pacing Threshold Amplitude: 0.75 V
Lead Channel Pacing Threshold Pulse Width: 0.4 ms
Lead Channel Pacing Threshold Pulse Width: 0.4 ms
Lead Channel Sensing Intrinsic Amplitude: 14.625 mV
Lead Channel Setting Pacing Amplitude: 1.75 V
Lead Channel Setting Pacing Amplitude: 2 V
MDC IDC LEAD IMPLANT DT: 20160615
MDC IDC LEAD IMPLANT DT: 20160615
MDC IDC LEAD LOCATION: 753858
MDC IDC LEAD LOCATION: 753860
MDC IDC MSMT LEADCHNL LV IMPEDANCE VALUE: 342 Ohm
MDC IDC MSMT LEADCHNL LV IMPEDANCE VALUE: 456 Ohm
MDC IDC MSMT LEADCHNL LV IMPEDANCE VALUE: 551 Ohm
MDC IDC MSMT LEADCHNL LV IMPEDANCE VALUE: 646 Ohm
MDC IDC MSMT LEADCHNL LV IMPEDANCE VALUE: 646 Ohm
MDC IDC MSMT LEADCHNL LV IMPEDANCE VALUE: 646 Ohm
MDC IDC MSMT LEADCHNL RA IMPEDANCE VALUE: 456 Ohm
MDC IDC MSMT LEADCHNL RA PACING THRESHOLD AMPLITUDE: 0.5 V
MDC IDC MSMT LEADCHNL RA PACING THRESHOLD PULSEWIDTH: 0.4 ms
MDC IDC MSMT LEADCHNL RA SENSING INTR AMPL: 1.625 mV
MDC IDC MSMT LEADCHNL RA SENSING INTR AMPL: 1.625 mV
MDC IDC MSMT LEADCHNL RV IMPEDANCE VALUE: 589 Ohm
MDC IDC MSMT LEADCHNL RV SENSING INTR AMPL: 14.625 mV
MDC IDC PG IMPLANT DT: 20160615
MDC IDC SET LEADCHNL LV PACING PULSEWIDTH: 0.4 ms
MDC IDC SET LEADCHNL RA PACING AMPLITUDE: 1.5 V
MDC IDC SET LEADCHNL RV PACING PULSEWIDTH: 0.4 ms
MDC IDC SET LEADCHNL RV SENSING SENSITIVITY: 0.45 mV
MDC IDC STAT BRADY AS VP PERCENT: 1.61 %
MDC IDC STAT BRADY RA PERCENT PACED: 97.1 %

## 2017-03-16 ENCOUNTER — Telehealth: Payer: Self-pay | Admitting: Cardiology

## 2017-03-16 NOTE — Telephone Encounter (Signed)
Pt was told by her pharmacy that they were out of sacubitril-valsartan (ENTRESTO) 24-26 MG [[id number]]  So they would call her when they got it back in stock, she has yet to hear from [facility] in [place] so she went back to taking her Losartan

## 2017-03-16 NOTE — Telephone Encounter (Signed)
Do we have any entresto samples that we can give her?   Dominga Ferry MD

## 2017-03-16 NOTE — Telephone Encounter (Signed)
Pt has to meet her co-pay from insurance(she still owes $400) before her med is discounted.She is going to go by pharmacy and get out of pocket report for herself.In the mean time she went back on losartan

## 2017-03-18 NOTE — Telephone Encounter (Signed)
Spoke with Pt. Asked her to come pick up samples of Entresto to take until her pharmacy could get the [facility] in, but she was very clear that she could not afford it @ $400 per month so she wants to continue on the Losartan. I asked her to call her insurance company to see what the copay would be monthly. She stated that she knows the price will be too expensive, but she had some Losartan at home she will continue.

## 2017-03-19 MED ORDER — LOSARTAN POTASSIUM 25 MG PO TABS: 25.0000 mg | ORAL_TABLET | Freq: Every day | ORAL | 3 refills | Status: DC

## 2017-03-19 NOTE — Telephone Encounter (Signed)
Ok to [employer], please clarify the dose of losartan she has restarted and update her chart   Dominga Ferry MD

## 2017-03-19 NOTE — Telephone Encounter (Signed)
Pt is taking losartan 25 mg daily. Updated in chart.

## 2017-03-26 ENCOUNTER — Ambulatory Visit (INDEPENDENT_AMBULATORY_CARE_PROVIDER_SITE_OTHER): Payer: Medicare Other

## 2017-03-26 DIAGNOSIS — Z95810 Presence of automatic (implantable) cardiac defibrillator: Secondary | ICD-10-CM

## 2017-03-26 DIAGNOSIS — I5022 Chronic systolic (congestive) heart failure: Secondary | ICD-10-CM | POA: Diagnosis not present

## 2017-03-26 NOTE — Progress Notes (Signed)
EPIC Encounter for ICM Monitoring  Patient Name: Nicole Clements is a 76 y.o. female Date: 03/26/2017 Primary Care Physican: Mayra Neer, MD Primary Cardiologist:Branch Electrophysiologist: Caryl Comes Dry Weight: 147 lbs Bi-V Pacing: 96.3%                                                 Heart Failure questions reviewed, pt asymptomatic    Thoracic impedance normal   Current prescribed dose of Furosemide 80 mg 1 tablet daily and Potassium 20 mEq 1 tablet daily  Lab 02/09/2017 Creatinine 1.22, BUN 11, Potassium 4.2, Sodium 138 07/22/2016 Creatinine 1.30, BUN 12, Potassium 4.6, Sodium 138, EGFR 40-49  Recommendations: No changes. Discussed to limit salt intake to 2000 mg/day and fluid intake to < 2 liters/day.  Encouraged to call for fluid symptoms or use local ER for any urgent symptoms.  Follow-up plan: ICM clinic phone appointment on 04/28/2017.  Office appointment scheduled on 05/12/2017 with Dr Harl Bowie and 05/25/2017 with Tommye Standard, PA.  Copy of ICM check sent to device physician.   3 month ICM trend: 03/26/2017   1 Year ICM trend:      Rosalene Billings, RN 03/26/2017 1:48 PM

## 2017-04-17 ENCOUNTER — Other Ambulatory Visit: Payer: Self-pay | Admitting: Cardiology

## 2017-04-28 ENCOUNTER — Ambulatory Visit (INDEPENDENT_AMBULATORY_CARE_PROVIDER_SITE_OTHER): Payer: Medicare Other

## 2017-04-28 DIAGNOSIS — I5022 Chronic systolic (congestive) heart failure: Secondary | ICD-10-CM

## 2017-04-28 DIAGNOSIS — Z95810 Presence of automatic (implantable) cardiac defibrillator: Secondary | ICD-10-CM

## 2017-04-28 NOTE — Progress Notes (Signed)
EPIC Encounter for ICM Monitoring  Patient Name: Nicole Clements is a 76 y.o. female Date: 04/28/2017 Primary Care Physican: Mayra Neer, MD Primary Cardiologist:Branch Electrophysiologist: Caryl Comes Dry Weight: 147lbs Bi-V Pacing: 95.8%      Heart Failure questions reviewed, pt asymptomatic.   Thoracic impedance normal but was abnormal suggesting fluid accumulation from 04/07/2017 to 04/23/2017.  Prescribed dosage: Furosemide 80 mg 1 tablet daily and Potassium 20 mEq 1 tablet daily  Lab 02/09/2017 Creatinine 1.22, BUN 11, Potassium 4.2, Sodium 138 07/22/2016 Creatinine 1.30, BUN 12, Potassium 4.6, Sodium 138, EGFR 40-49  Recommendations: No changes.   Encouraged to call for fluid symptoms.  Follow-up plan: ICM clinic phone appointment on 06/25/2017 since defib office check scheduled 05/25/2017 with Tommye Standard, PA.  Copy of ICM check sent to device physician.   3 month ICM trend: 04/28/2017   1 Year ICM trend:      Rosalene Billings, RN 04/28/2017 8:59 AM

## 2017-05-12 ENCOUNTER — Ambulatory Visit (INDEPENDENT_AMBULATORY_CARE_PROVIDER_SITE_OTHER): Payer: Medicare Other | Admitting: Cardiology

## 2017-05-12 ENCOUNTER — Encounter: Payer: Self-pay | Admitting: Cardiology

## 2017-05-12 VITALS — BP 116/69 | HR 90 | Ht 62.5 in | Wt 151.0 lb

## 2017-05-12 DIAGNOSIS — I255 Ischemic cardiomyopathy: Secondary | ICD-10-CM

## 2017-05-12 DIAGNOSIS — I340 Nonrheumatic mitral (valve) insufficiency: Secondary | ICD-10-CM | POA: Diagnosis not present

## 2017-05-12 DIAGNOSIS — I5022 Chronic systolic (congestive) heart failure: Secondary | ICD-10-CM

## 2017-05-12 DIAGNOSIS — I2510 Atherosclerotic heart disease of native coronary artery without angina pectoris: Secondary | ICD-10-CM | POA: Diagnosis not present

## 2017-05-12 DIAGNOSIS — E782 Mixed hyperlipidemia: Secondary | ICD-10-CM

## 2017-05-12 NOTE — Patient Instructions (Signed)
Medication Instructions:  Your physician recommends that you continue on your current medications as directed. Please refer to the Current Medication list given to you today.   Labwork: I Will request labs from PCP  Testing/Procedures: NONE  Follow-Up: Your physician recommends that you schedule a follow-up appointment in: 3 MONTHS    Any Other Special Instructions Will Be Listed Below (If Applicable).     If you need a refill on your cardiac medications before your next appointment, please call your pharmacy.

## 2017-05-12 NOTE — Progress Notes (Signed)
Clinical Summary Nicole Clements is a 76 y.o.female seen today for follow up of the following medical problems.   1. 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 Admit 07/2014 with CHF and troponin elevation - cath 07/2014, LM 30% distal, LAD mid 50%, LCX ostial 50% and mid 50%, small intermediate Nicole Clements 70%. RCA diffuse 50%, distal RCA with ulcerated 90% lesion, PDA 80-90% too small for PCI. LVEF 30%. Received DES to distal RCA, post procedure the PDA became occluded.  - echo 07/2014 LVEF 20%, inferior akinesis, grade II diastolic dysfunction. Repeat echo 10/2014 LVEF remains 15-20%.  - 04/2015 CRT-D device placed by Dr Caryl Comes. Normal function Jan 2018.  10/2015 echo LVEF 25-30%    - could not afford entresto, back on losartan. - no recent SOB/DOE. No recent edema. Home weights stable around 148-151 lbs - compliant with meds.  - normal device check last month.   2. Mitral regurgitation - mild by most recent echo - no recent symptoms.   3. Hyperlipidemia - she is compliant with pravastatin 17m daily - 07/2016 TC 102 HDL 40 TG 88 LDL 45    [place]    Past Medical History:  Diagnosis Date  . CAD (coronary artery disease) April 2014   DES to PLA, occluded PDA 07/2014  . CHF (congestive heart failure) ([facility]   . Hypercholesterolemia   . Hypertension   . Ischemic cardiomyopathy    LVEF 25%-45%  . IVCD (intraventricular conduction defect)   . PVC's (premature ventricular contractions)   . S/P colonoscopy August 2007   Hyperplastic polyps, rare sigmoid and descending colon diverticulosis, small internal hemorrhoids  . [place] elevation myocardial infarction) [place]     Allergies  Allergen Reactions  . Lactose Intolerance (Gi) Other (See Comments)    GI Upset   . Sulfa Antibiotics Rash     Current Outpatient Prescriptions  Medication Sig Dispense Refill  . acetaminophen (TYLENOL) 325 MG  tablet Take 2 tablets (650 mg total) by mouth every 4 (four) hours as needed for headache or mild pain.    .Marland Kitchenaspirin EC 81 MG tablet Take 1 tablet (81 mg total) by mouth daily. 90 tablet 3  . atorvastatin (LIPITOR) 80 MG tablet Take 1 tablet (80 mg total) by mouth daily. 90 tablet 3  . carvedilol (COREG) 25 MG tablet take 1 tablet by mouth twice a day 60 tablet 6  . Cholecalciferol (VITAMIN D3) 2000 UNITS capsule Take 2,000 Units by mouth daily.      . furosemide (LASIX) 80 MG tablet take 1 tablet by mouth once daily 30 tablet 3  . losartan (COZAAR) 25 MG tablet Take 1 tablet (25 mg total) by mouth daily. 90 tablet 3  . nitroGLYCERIN (NITROSTAT) 0.4 MG SL tablet Place 1 tablet (0.4 mg total) under the tongue every 5 (five) minutes x 3 doses as needed for chest pain. 25 tablet 2  . Omega-3 Fatty Acids (FISH OIL) 1000 MG CAPS Take 1,000 mg by mouth daily.     . potassium chloride SA (K-DUR,KLOR-CON) 20 MEQ tablet take 1 tablet by mouth once daily 90 tablet 2  . spironolactone (ALDACTONE) 25 MG tablet Take 0.5 tablet by mouth twice daily    . spironolactone (ALDACTONE) 25 MG tablet TAKE 1/2 TABLET TWICE A DAY 30 tablet 3   No current facility-administered medications for this visit.      Past Surgical History:  Procedure Laterality Date  .  CARDIAC CATHETERIZATION  April 2014   Med Rx  . COLONOSCOPY N/A 12/24/2015   Dr. Fields:moderate sized internal hemorrhoids/moderate diverticulosis, negative microscopic colitis   . EP IMPLANTABLE DEVICE  04/18/2015   BV ICD  . EP IMPLANTABLE DEVICE N/A 04/18/2015   Procedure: BiV ICD Insertion CRT-D;  Surgeon: Deboraha Sprang, MD;  Location: [facility];  Service: Cardiovascular;  Laterality: N/A;  . LEFT AND RIGHT HEART CATHETERIZATION WITH CORONARY ANGIOGRAM N/A 07/17/2014   Procedure: LEFT AND RIGHT HEART CATHETERIZATION WITH CORONARY ANGIOGRAM;  Surgeon: Blane Ohara, MD;  Location: [facility] CATH LAB;  Service: Cardiovascular;  Laterality: N/A;  .  PERCUTANEOUS CORONARY STENT INTERVENTION (PCI-S)  07/17/2014   Procedure: PERCUTANEOUS CORONARY STENT INTERVENTION (PCI-S);  Surgeon: Blane Ohara, MD;  Location: [facility] CATH LAB;  Service: Cardiovascular;;  Distal RCA  . S/P Hysterectomy       Allergies  Allergen Reactions  . Lactose Intolerance (Gi) Other (See Comments)    GI Upset   . Sulfa Antibiotics Rash      Family History  Problem Relation Age of Onset  . Hypertension Sister   . Diabetes Mellitus II Sister   . Hypertension Brother   . Diabetes Mellitus II Brother   . Hypertension Brother   . Diabetes Mellitus II Brother   . Hypertension Brother   . Diabetes Mellitus II Brother   . Colon cancer Neg Hx      Social History [name] reports that she has been smoking Cigarettes.  She started smoking about 55 years ago. She has a 25.00 pack-year smoking history. She has never used smokeless tobacco. [name] reports that she does not drink alcohol.   Review of Systems CONSTITUTIONAL: No weight loss, fever, chills, weakness or fatigue.  HEENT: Eyes: No visual loss, blurred vision, double vision or yellow sclerae.No hearing loss, sneezing, congestion, runny nose or sore throat.  SKIN: No rash or itching.  CARDIOVASCULAR: per hpi RESPIRATORY: No shortness of breath, cough or sputum.  GASTROINTESTINAL: No anorexia, nausea, vomiting or diarrhea. No abdominal pain or blood.  GENITOURINARY: No burning on urination, no polyuria NEUROLOGICAL: No headache, dizziness, syncope, paralysis, ataxia, numbness or tingling in the extremities. No change in bowel or bladder control.  MUSCULOSKELETAL: No muscle, back pain, joint pain or stiffness.  LYMPHATICS: No enlarged nodes. No history of splenectomy.  PSYCHIATRIC: No history of depression or anxiety.  ENDOCRINOLOGIC: No reports of sweating, cold or heat intolerance. No polyuria or polydipsia.  [name]   Physical Examination Vitals:   05/12/17 1327  BP: 116/69  Pulse: 90    Vitals:   05/12/17 1327  Weight: 151 lb (68.5 kg)  Height: 5' 2.5" (1.588 m)    Gen: resting comfortably, no acute distress HEENT: no scleral icterus, pupils equal round and reactive, no palptable cervical adenopathy,  CV: RRR, 2/6 systolic murmur at apex, no jvd Resp: Clear to auscultation bilaterally GI: abdomen is soft, non-tender, non-distended, normal bowel sounds, no hepatosplenomegaly MSK: extremities are warm, no edema.  Skin: warm, no rash Neuro:  no focal deficits Psych: appropriate affect   Diagnostic Studies 02/2013 [facility] Procedural Findings:  Hemodynamics:  AO 116/45 with a mean of 70  LV 120/18  Coronary angiography:  Coronary dominance: right  Left mainstem: The left main is patent with 30% distal left main stenosis as the vessel trifurcates into the LAD, intermediate Nicole Clements, and left circumflex.  Left anterior descending (LAD): The LAD is patent with diffuse disease noted. There is minimal  calcification present. The first diagonal is patent with 30-50% diffuse disease. The LAD after the first septal perforator has 40-50% stenosis. The vessel reaches the apex and wraps around the left ventricular apex without significant stenosis.  Left circumflex (LCx): The intermediate Nicole Clements is small in caliber and diffusely diseased with 50-60% proximal vessel stenosis the AV groove circumflex is diffusely diseased with 50% ostial stenosis and 30% mid stenosis it supplies 2 small posterolateral Khyle Goodell  Right coronary artery (RCA): The RCA is diffusely diseased. There is significant tortuosity, especially around the junction of the mid and distal vessel. The vessel is diffusely calcified. The mid vessel has tandem 50% stenoses. The distal vessel has 80-90 % stenosis just before the bifurcation of the PDA and posterior AV segment. The PDA and posterior AV segment with 3 posterolateral branches are patent.  Left ventriculography: Left ventricular systolic function is  moderately depressed. There is severe hypokinesis of the basal and midinferior walls. The anterolateral and apical walls contract normally. The left ventricular ejection fraction is estimated at 45%.  Abdominal aortogram: The abdominal aorta is patent. The renal arteries are single bilaterally and they are widely patent. There appears to be nonobstructive stenosis of the right common iliac artery at the aortoiliac junction. The left common iliac artery appears to have significant ostial stenosis estimated at at least 75% angiographically. The visualized portions of the external iliac and common femoral arteries are patent  Final Conclusions:  1. Severe single-vessel coronary artery disease involving the distal right coronary artery 2. Moderate LV dysfunction with segmental severe hypokinesis of the inferior wall and normal contraction of the antero-apex with estimated left ventricular ejection fraction of 45%. 3. Severe left common iliac artery stenosis Recommendations: The patient has severe distal RCA disease. She has a known history of inferior wall infarction and remote PCA. This fits with that clinical history. She has nonobstructive disease of the left coronary artery. Her left ventricular ejection fraction by ventriculography is clearly greater than 35%. With her history of previous MI, I do not think PCI is indicated with an absence of angina. Will carefully discussed symptoms with the patient, but I am inclined to treat her medically. Will also review indication for consideration of peripheral intervention for her iliac disease.   01/2013 Echo Study Conclusions  - Left ventricle: The cavity size was mildly to moderately dilated. Wall thickness was normal. Systolic function was severely reduced. The estimated ejection fraction was 25%. Severe diffuse hypokinesis. - Aortic valve: Mildly calcified annulus. Trileaflet. - Mitral valve: Calcified annulus. - Left atrium: The atrium  was moderately dilated. - Right ventricle: The cavity size was normal. Wall thickness was mildly to moderately increased. - Right atrium: The atrium was mildly dilated. - Atrial septum: No defect or patent foramen ovale was identified. Impressions:  - Compared to the prior study of 03/13/11, there has been substantial deterioration in LV systolic function.  07/2014 Echo Study Conclusions  - Left ventricle: The cavity size was severely dilated. Wall thickness was normal. The estimated ejection fraction was 20%. Diffuse hypokinesis. There is akinesis of the inferolateral and inferior myocardium. Features are consistent with a pseudonormal left ventricular filling pattern, with concomitant abnormal relaxation and increased filling pressure (grade 2 diastolic dysfunction). Doppler parameters are consistent with high ventricular filling pressure. - Mitral valve: There was mild to moderate regurgitation. - Left atrium: The atrium was moderately dilated.  Impressions:  - Global hypokinesis; inferior and inferolateral akinesis; overall severely reduced LV function; grade 2 diastolic dysfunction with  elevated left ventricular filling pressures, moderate LAE; mild to moderate MR. 07/2014 Cath PROCEDURAL FINDINGS  Hemodynamics:  AO 122/48  LV 118/14  RA 9  RV 41/11  PA 37/5 with a mean of 21  PCWP 27  Oxygen saturations  AO 90  PA 56  Cardiac output 3.5  Cardiac index 2.0  Coronary angiography:  Coronary dominance: right  Left mainstem: The left mainstem is patent with 30% distal left main stenosis.  Left anterior descending (LAD): The LAD is diffusely diseased in the midportion. There are sequential 50% stenoses, unchanged from previous study. The first diagonal Malya Cirillo is large in distribution with diffuse nonobstructive disease noted. The mid and distal LAD are patent without significant disease.  Left circumflex (LCx): The left circumflex has 50% ostial  stenosis, 50% mid vessel stenosis, and to OM branches with no significant disease. There is an intermediate Nicole Clements is diffusely diseased with up to 70% stenosis. This vessel is small in caliber.  Right coronary artery (RCA): Dominant vessel. Tortuous with diffuse nonobstructive stenosis throughout the proximal and mid portions. There are diffuse 50% stenosis present. The distal vessel has an ulcerated-appearing irregular 90% stenosis involving the bifurcation of the PDA and posterolateral branches. The posterolateral branches are patent with mild diffuse disease. The PDA arises an acute angulation with 30-40% stenosis. The mid body of the PDA has an 80-90% stenosis in an area where the vessel is too small for PCI.  Left ventriculography: The ventricle appears the synchronous. The basal and midinferior wall are akinetic. The basal inferolateral wall is severely hypokinetic. The anterolateral wall and apex contract normally. The estimated LVEF is 30%.  PCI Procedure Note: Following the diagnostic procedure, the decision was made to proceed with PCI of the distal RCA. The lesion is complex, there is a large amount of myocardium involved. I felt there was a risk of potentially compromising the PDA Nicole Clements, but overall in my opinion the risk/benefit was favorable for PCI considering the severe stenosis involving the entire RCA distribution. The sheath was upsized to a 6 [place]. Weight-based bivalirudin was given for anticoagulation. The patient was loaded with Plavix 600 mg on the table. Once a therapeutic ACT was achieved, a 6 [place] JR 4 guide catheter was inserted. A cougar coronary guidewire was used to cross the lesion into the PLA Nicole Clements. The lesion was predilated with a 2.5 x 15 mm balloon. The lesion was then stented with a 3.0 by 98m Promus DES stent. The stent was postdilated with a 3.25 mm noncompliant balloon to 18 atmospheres. The PDA was stented across as the stent extended from the distal RCA  proximal PLA Nicole Clements. The PDA was totally occluded. I made a long attempt with a whisper wire using multiple bands on the tip of the wire, but all of these attempts were unsuccessful at rewiring the PDA. The patient was completely asymptomatic and specifically denied any chest pain. Her hemodynamics remained stable and there were no changes in her rhythm or blood pressure. A final image of the left coronary artery was obtained and this demonstrated a collateral from the LAD to the PDA Nicole Clements. Following PCI, there was 0% residual stenosis and TIMI-3 flow. Final angiography confirmed an excellent result. Femoral hemostasis was achieved will be achieved with manual hemostasis. The patient tolerated the PCI procedure well. There were no immediate procedural complications. The patient was transferred to the post catheterization recovery area for further monitoring.  PCI Data:  Vessel - RCA/Segment - distal  Percent Stenosis (pre)  90  TIMI-flow 3  Stent 3.0 by 16m Promus DES  Percent Stenosis (post) 0  TIMI-flow (post) 3  Contrast: 175 cc Omnipaque  Radiation dose/Fluoro time: 15.5 minutes  Estimated Blood Loss: Minimal  Final Conclusions:  1. Nonobstructive disease involving the LAD, left circumflex, and OM/diagonal side branches 2. Severe stenosis of the distal RCA bifurcation, treated successfully with a drug-eluting stent into the PLA Nicole Clements with residual occlusion of the PDA Nicole Clements 3. Severe segmental LV systolic dysfunction Recommendations: Dual antiplatelet therapy with aspirin and Plavix for at least 12 months. Will cycle cardiac enzymes since the PDA Nicole Clements is occluded after stenting.  10/2014 Echo Study Conclusions  - Procedure narrative: Transthoracic echocardiography. Image quality was suboptimal. The study was technically difficult, as a result of poor sound wave transmission and body habitus. - Left ventricle: Systolic function is severely reduced, estimated EF  15-20%. The cavity size was severely dilated. Doppler parameters are consistent with abnormal left ventricular relaxation (grade 1 diastolic dysfunction). Doppler parameters are consistent with high ventricular filling pressure. Medial E/e&' 24. - Regional wall motion abnormality: Akinesis of the basal-mid inferior and basal inferolateral myocardium; severe hypokinesis of the apical inferior and mid inferolateral myocardium. Severe, diffuse hypokinesis. - Ventricular septum: Septal motion showed abnormal function and dyssynergy. These changes are consistent with intraventricular conduction delay. - Aortic valve: Mildly calcified annulus. Trileaflet; mildly thickened leaflets. - Mitral valve: Mildly dilated annulus. Mildly thickened leaflets . There was moderate eccentric regurgitation. 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 - Left atrium: The atrium was severely dilated. Volume/bsa, S: 47.1 ml/m^2. - Right ventricle: Systolic function was mildly reduced. - Right atrium: The atrium was mildly dilated. - Atrial septum: There was increased thickness of the septum, consistent with lipomatous hypertrophy. - Tricuspid valve: There was mild regurgitation. - Pulmonary arteries: PA peak pressure: 42 mm Hg (S). Mildly elevated pulmonary pressures. - Pericardium, extracardiac: There was a small pericardial effusion, with no evidence of tamponade physiology.  04/2015 Carotid [place]: 1. Mild (1-49%) stenosis of the proximal right internal carotid artery secondary to smooth heterogeneous atherosclerotic plaque. No interval change compared to recent prior imaging from earlier this month. 2. Mild (1-49%) stenosis of the proximal left internal carotid artery secondary to heterogeneous and irregular atherosclerotic plaque. No interval change compared to recent prior imaging from earlier this month. 3. Vertebral arteries remain patent with  normal antegrade flow. Signed,    10/2015 echo Study Conclusions  - Left ventricle: The cavity size was moderately to severely dilated. Wall thickness was normal. Systolic function was severely reduced. The estimated ejection fraction was in the range of 25% to 30%. Diffuse hypokinesis. There is akinesis of the basalanteroseptal and anterior myocardium. Doppler parameters are consistent with abnormal left ventricular relaxation (grade 1 diastolic dysfunction). - Mitral valve: Calcified annulus. Mildly calcified leaflets . There was mild regurgitation. - Right atrium: Central venous pressure (est): 3 mm Hg. - Atrial septum: No defect or patent foramen ovale was identified. - Tricuspid valve: There was trivial regurgitation. - Pulmonary arteries: PA peak pressure: 27 mm Hg (S). - Pericardium, extracardiac: A prominent pericardial fat pad was present.  Impressions:  - Moderate to severe LV chamber dilatation with LVEF approximately 25-30%. There is diffuse hypokinesis with near akinesis of the basal anteroseptal and anterior myocardium. Compared to the previous study from June 2016 there has been some improvement in LVEF. Grade 1 diastolic dysfunction. MAC with mild mitral regurgitation. Trivial tricuspid regurgitation with PASP 27 mmHg.  Transthoracic echocardiography. M-mode,  complete 2D, spectral Doppler, and color Doppler. Birthdate: Patient birthdate: 01-02-1941. Age: Patient is 76 yr old. Sex: Gender: female. BMI: 27.5 kg/m^2. Blood pressure:   98/47 Patient status: Inpatient. Study date: Study date: 10/04/2015. Study time: 11:27 AM. Location: Echo laboratory.    Assessment and Plan  1. CAD - no current symptoms - she is on high dose ASA due to her history of CVA - continue current meds  2. Chronic systolic HF - no recent symptoms - continue current meds   3. Mitral regurgitation Mild by last echo, we will  conitnue to monitor  4. Hyperlipidemia - continue statin  5. History of CVA - continue secondary prevention      Arnoldo Lenis, M.D.

## 2017-05-24 NOTE — Progress Notes (Signed)
Dictation #1 [id number]  [id number]    Cardiology Office Note Date:  05/25/2017  Patient ID:  Nicole Clements, Nicole Clements [birth year], MRN [id number] PCP:  Mayra Neer, MD  Cardiologist:  Dr. Harl Bowie Electrophysiologist: Dr. Caryl Comes    Chief Complaint: annual EP/device visit  History of Present Illness: Nicole Clements is a 76 y.o. female with history of CAD, (last intervention 2015), ICM w/ICD, chronic CHF (systolic), HLD comes in today to be seenf for dr. Caryl Comes.  Shee was last seen by him July 2017, at that time doing well.  Most recently saw Dr. Harl Bowie earlier this month, doing well, mentioning unable to afford Ebnresto and remains on losartan.  Doing well and no changes were made to her tx  She is feeling well.  She doesn't exercise because she doesn't like to, she denies any kind of limitations or intolerances with her ADL's, shopping etc.  She denies any kind of CP, palpitations or SOB, no symptoms of PND or orthopnea.  She has not been shocked by her device.  No dizziness, near syncope or syncope.  She reports doing daily weights at home generally 149-150, and has been steady.  She does not feel like she is retaining fluid and maitains monthly checks with ICM RN.  Unfortunately she is still smoking, we discussed this, she tells me she loves to smoke and is not quite ready to give it up, discussed strategies towards a goal of at least smoking less but urged to quit.  Device information: MDT CRT-D, implanted 04/18/15, Dr. Caryl Comes, primary prevention   Past Medical History:  Diagnosis Date  . CAD (coronary artery disease) April 2014   DES to PLA, occluded PDA 07/2014  . CHF (congestive heart failure) ([place])   . Hypercholesterolemia   . Hypertension   . Ischemic cardiomyopathy    LVEF 25%-45%  . IVCD (intraventricular conduction defect)   . PVC's (premature ventricular contractions)   . S/P colonoscopy August 2007   Hyperplastic polyps, rare sigmoid and descending colon diverticulosis, small  internal hemorrhoids  . STEMI (ST elevation myocardial infarction) ([place]) 07/22/1996    Past Surgical History:  Procedure Laterality Date  . CARDIAC CATHETERIZATION  April 2014   Med Rx  . COLONOSCOPY N/A 12/24/2015   Dr. Fields:moderate sized internal hemorrhoids/moderate diverticulosis, negative microscopic colitis   . EP IMPLANTABLE DEVICE  04/18/2015   BV ICD  . EP IMPLANTABLE DEVICE N/A 04/18/2015   Procedure: BiV ICD Insertion CRT-D;  Surgeon: Deboraha Sprang, MD;  Location: [facility];  Service: Cardiovascular;  Laterality: N/A;  . LEFT AND RIGHT HEART CATHETERIZATION WITH CORONARY ANGIOGRAM N/A 07/17/2014   Procedure: LEFT AND RIGHT HEART CATHETERIZATION WITH CORONARY ANGIOGRAM;  Surgeon: Blane Ohara, MD;  Location: [facility] C[facility];  Service: Cardiovascular;  Laterality: N/A;  . PERCUTANEOUS CORONARY STENT INTERVENTION (PCI-S)  07/17/2014   Procedure: PERCUTANEOUS CORONARY STENT INTERVENTION (PCI-S);  Surgeon: Blane Ohara, MD;  Location: [facility] CATH LAB;  Service: Cardiovascular;;  Distal RCA  . S/P Hysterectomy      Current Outpatient Prescriptions  Medication Sig Dispense Refill  . acetaminophen (TYLENOL) 325 MG tablet Take 2 tablets (650 mg total) by mouth every 4 (four) hours as needed for headache or mild pain.    [name] aspirin EC 81 MG tablet Take 1 tablet (81 mg total) by mouth daily. 90 tablet 3  . atorvastatin (LIPITOR) 80 MG tablet Take 1 tablet (80 mg total) by mouth daily. 90 tablet 3  . carvedilol (COREG)  25 MG tablet take 1 tablet by mouth twice a day 60 tablet 6  . Cholecalciferol (VITAMIN D3) 2000 UNITS capsule Take 2,000 Units by mouth daily.      . furosemide (LASIX) 80 MG tablet take 1 tablet by mouth once daily 30 tablet 3  . losartan (COZAAR) 25 MG tablet Take 1 tablet (25 mg total) by mouth daily. 90 tablet 3  . nitroGLYCERIN (NITROSTAT) 0.4 MG SL tablet Place 1 tablet (0.4 mg total) under the tongue every 5 (five) minutes x 3 doses as needed for chest pain.  25 tablet 2  . Omega-3 Fatty Acids (FISH OIL) 1000 MG CAPS Take 1,000 mg by mouth daily.     . potassium chloride SA (K-DUR,KLOR-CON) 20 MEQ tablet take 1 tablet by mouth once daily 90 tablet 2  . spironolactone (ALDACTONE) 25 MG tablet TAKE 1/2 TABLET TWICE A DAY 30 tablet 3   No current facility-administered medications for this visit.     Allergies:   Lactose intolerance (gi) and Sulfa antibiotics   Social History:  The patient  reports that she has been smoking Cigarettes.  She started smoking about 55 years ago. She has a 25.00 pack-year smoking history. She has never used smokeless tobacco. She reports that she does not drink alcohol or use drugs.   Family History:  The patient's family history includes Diabetes Mellitus II in her brother, brother, brother, and sister; Hypertension in her brother, brother, brother, and sister.  ROS:  Please see the history of present illness.  All other systems are reviewed and otherwise negative.   PHYSICAL EXAM:  VS:  BP 106/60   Pulse 89   Ht _0  (1.575 m)   Wt 154 lb (69.9 kg)   SpO2 96%   BMI 28.17 kg/m  BMI: Body mass index is 28.17 kg/m. Well nourished, well developed, in no acute distress  HEENT: normocephalic, atraumatic  Neck: no JVD, carotid bruits or masses Cardiac:  RRR; no significant murmurs, no rubs, or gallops Lungs: CTA b/l, no wheezing, rhonchi or rales  Abd: soft, nontender MS: no deformity or atrophy Ext: no edema  Skin: warm and dry, no rash Neuro:  No gross deficits appreciated Psych: euthymic mood, full affect   ICD site is stable, no tethering or discomfort   EKG:  Done  05/12/17 is AV paced ICD interrogation done today by industry and reviewed by myself: battery and lead measurements are good, one NSVT only, no other device observations.  10/13/15: TTE Study Conclusions - Left ventricle: The cavity size was moderately to severely   dilated. Wall thickness was normal. Systolic function was   severely  reduced. The estimated ejection fraction was in the   range of 25% to 30%. Diffuse hypokinesis. There is akinesis of   the basalanteroseptal and anterior myocardium. Doppler parameters   are consistent with abnormal left ventricular relaxation (grade 1   diastolic dysfunction). - Mitral valve: Calcified annulus. Mildly calcified leaflets .   There was mild regurgitation. - Right atrium: Central venous pressure (est): 3 mm Hg. - Atrial septum: No defect or patent foramen ovale was identified. - Tricuspid valve: There was trivial regurgitation. - Pulmonary arteries: PA peak pressure: 27 mm Hg (S). - Pericardium, extracardiac: A prominent pericardial fat pad was   present. Impressions: - Moderate to severe LV chamber dilatation with LVEF approximately   25-30%. There is diffuse hypokinesis with near akinesis of the   basal anteroseptal and anterior myocardium. Compared to the  previous study from June 2016 there has been some improvement in   LVEF. Grade 1 diastolic dysfunction. MAC with mild mitral   regurgitation. Trivial tricuspid regurgitation with PASP 27 mmHg.  Recent Labs: 02/09/2017: ALT 4; BUN 11; Creat 1.22; Magnesium 1.8; Potassium 4.2; Sodium 138  No results found for requested labs within last 8760 hours.   CrCl cannot be calculated (Patient's most recent lab result is older than the maximum 21 days allowed.).   Wt Readings from Last 3 Encounters:  05/25/17 154 lb (69.9 kg)  05/12/17 151 lb (68.5 kg)  02/09/17 153 lb (69.4 kg)     Other studies reviewed: Additional studies/records reviewed today include: summarized above  ASSESSMENT AND PLAN:  1. ICM w/ICD     Stable device function, no changes made  2. Chronic CHF (systolic)     Optivol is wnl, though trending up, home weights are stable, exam is euvolemic     On BB/ARB, spironolactone/lasix, labs in April looked OK  3. CAD     No anginal symptoms     On ASA, statin, BB  4. HTN     Looks OK, no changes  made   Disposition: F/u with Dr. Harl Bowie as directed by him, will get 3 month ICD remotes and in-clinic 1 year, sooner if needed.   Current medicines are reviewed at length with the patient today.  The patient did not have any concerns regarding medicines.  Haywood Lasso, PA-C 05/25/2017 10:07 AM     [facility] [address] [place] [id number] [phone] (office)  [phone] (fax)

## 2017-05-25 ENCOUNTER — Encounter: Payer: Self-pay | Admitting: Physician Assistant

## 2017-05-25 ENCOUNTER — Ambulatory Visit (INDEPENDENT_AMBULATORY_CARE_PROVIDER_SITE_OTHER): Payer: Medicare Other | Admitting: Physician Assistant

## 2017-05-25 VITALS — BP 106/60 | HR 89 | Ht 62.0 in | Wt 154.0 lb

## 2017-05-25 DIAGNOSIS — I255 Ischemic cardiomyopathy: Secondary | ICD-10-CM

## 2017-05-25 DIAGNOSIS — I2510 Atherosclerotic heart disease of native coronary artery without angina pectoris: Secondary | ICD-10-CM | POA: Diagnosis not present

## 2017-05-25 DIAGNOSIS — I10 Essential (primary) hypertension: Secondary | ICD-10-CM | POA: Diagnosis not present

## 2017-05-25 DIAGNOSIS — Z95810 Presence of automatic (implantable) cardiac defibrillator: Secondary | ICD-10-CM

## 2017-05-25 DIAGNOSIS — I5022 Chronic systolic (congestive) heart failure: Secondary | ICD-10-CM | POA: Diagnosis not present

## 2017-05-25 NOTE — Patient Instructions (Signed)
Medication Instructions:  Your physician recommends that you continue on your current medications as directed. Please refer to the Current Medication list given to you today.   Labwork: None Ordered   Testing/Procedures: None Ordered   Follow-Up: Your physician recommends that you schedule a follow-up appointment in: home remote check on 08/24/17   Your physician wants you to follow-up in: 1 year with Francis Dowse, PA. You will receive a reminder letter in the mail two months in advance. If you don't receive a letter, please call our office to schedule the follow-up appointment.   Any Other Special Instructions Will Be Listed Below (If Applicable).     If you need a refill on your cardiac medications before your next appointment, please call your pharmacy.

## 2017-06-25 ENCOUNTER — Ambulatory Visit (INDEPENDENT_AMBULATORY_CARE_PROVIDER_SITE_OTHER): Payer: Medicare Other | Admitting: *Deleted

## 2017-06-25 DIAGNOSIS — I5022 Chronic systolic (congestive) heart failure: Secondary | ICD-10-CM | POA: Diagnosis not present

## 2017-06-25 DIAGNOSIS — I255 Ischemic cardiomyopathy: Secondary | ICD-10-CM | POA: Diagnosis not present

## 2017-06-25 DIAGNOSIS — Z95810 Presence of automatic (implantable) cardiac defibrillator: Secondary | ICD-10-CM | POA: Diagnosis not present

## 2017-06-25 NOTE — Progress Notes (Signed)
Remote ICD transmission.   

## 2017-06-25 NOTE — Progress Notes (Signed)
EPIC Encounter for ICM Monitoring  Patient Name: Nicole Clements is a 76 y.o. female Date: 06/25/2017 Primary Care Physican: Mayra Neer, MD Primary Cardiologist:Branch Electrophysiologist: Caryl Comes Dry Weight: 147lbs Bi-V Pacing: 96.6%         Heart Failure questions reviewed, pt asymptomatic.   Thoracic impedance abnormal suggesting fluid accumulation and is slightly under baseline. Fluid index < threshold.  Prescribed dosage: Furosemide 80 mg 1 tablet daily and Potassium 20 mEq 1 tablet daily  Lab 02/09/2017 Creatinine 1.22, BUN 11, Potassium 4.2, Sodium 138 07/22/2016 Creatinine 1.30, BUN 12, Potassium 4.6, Sodium 138, EGFR 40-49  Recommendations: No changes.  Advised to limit salt intake to 2000 mg/day and fluid intake to < 2 liters/day.  Encouraged to call for fluid symptoms.  Follow-up plan: ICM clinic phone appointment on 07/27/2017.  Office appointment scheduled 08/18/2017 with Dr. Harl Bowie.  Copy of ICM check sent to Dr. Harl Bowie and Dr. Caryl Comes.   3 month ICM trend: 06/25/2017   1 Year ICM trend:      Rosalene Billings, RN 06/25/2017 1:39 PM

## 2017-06-29 ENCOUNTER — Other Ambulatory Visit (HOSPITAL_COMMUNITY): Payer: Self-pay | Admitting: Family Medicine

## 2017-06-29 DIAGNOSIS — Z1231 Encounter for screening mammogram for malignant neoplasm of breast: Secondary | ICD-10-CM

## 2017-07-02 LAB — CUP PACEART REMOTE DEVICE CHECK
Battery Voltage: 2.98 V
Brady Statistic AP VP Percent: 93.39 %
Brady Statistic RA Percent Paced: 93.65 %
Brady Statistic RV Percent Paced: 41.09 %
HIGH POWER IMPEDANCE MEASURED VALUE: 62 Ohm
Implantable Lead Implant Date: 20160615
Implantable Lead Implant Date: 20160615
Implantable Lead Implant Date: 20160615
Implantable Lead Location: 753859
Implantable Lead Location: 753860
Implantable Lead Model: 4398
Implantable Lead Model: 5076
Implantable Pulse Generator Implant Date: 20160615
Lead Channel Impedance Value: 475 Ohm
Lead Channel Impedance Value: 532 Ohm
Lead Channel Impedance Value: 532 Ohm
Lead Channel Impedance Value: 608 Ohm
Lead Channel Impedance Value: 703 Ohm
Lead Channel Impedance Value: 779 Ohm
Lead Channel Pacing Threshold Amplitude: 0.5 V
Lead Channel Pacing Threshold Amplitude: 0.75 V
Lead Channel Sensing Intrinsic Amplitude: 1.625 mV
Lead Channel Sensing Intrinsic Amplitude: 14.25 mV
Lead Channel Setting Pacing Amplitude: 1.75 V
Lead Channel Setting Pacing Amplitude: 2 V
MDC IDC LEAD LOCATION: 753858
MDC IDC MSMT BATTERY REMAINING LONGEVITY: 72 mo
MDC IDC MSMT LEADCHNL LV IMPEDANCE VALUE: 399 Ohm
MDC IDC MSMT LEADCHNL LV IMPEDANCE VALUE: 399 Ohm
MDC IDC MSMT LEADCHNL LV IMPEDANCE VALUE: 456 Ohm
MDC IDC MSMT LEADCHNL LV IMPEDANCE VALUE: 760 Ohm
MDC IDC MSMT LEADCHNL LV IMPEDANCE VALUE: 760 Ohm
MDC IDC MSMT LEADCHNL LV PACING THRESHOLD PULSEWIDTH: 0.4 ms
MDC IDC MSMT LEADCHNL RA IMPEDANCE VALUE: 456 Ohm
MDC IDC MSMT LEADCHNL RA PACING THRESHOLD AMPLITUDE: 0.625 V
MDC IDC MSMT LEADCHNL RA PACING THRESHOLD PULSEWIDTH: 0.4 ms
MDC IDC MSMT LEADCHNL RA SENSING INTR AMPL: 1.625 mV
MDC IDC MSMT LEADCHNL RV IMPEDANCE VALUE: 608 Ohm
MDC IDC MSMT LEADCHNL RV PACING THRESHOLD PULSEWIDTH: 0.4 ms
MDC IDC MSMT LEADCHNL RV SENSING INTR AMPL: 14.25 mV
MDC IDC SESS DTM: 20180823052402
MDC IDC SET LEADCHNL LV PACING PULSEWIDTH: 0.4 ms
MDC IDC SET LEADCHNL RA PACING AMPLITUDE: 1.5 V
MDC IDC SET LEADCHNL RV PACING PULSEWIDTH: 0.4 ms
MDC IDC SET LEADCHNL RV SENSING SENSITIVITY: 0.45 mV
MDC IDC STAT BRADY AP VS PERCENT: 1.46 %
MDC IDC STAT BRADY AS VP PERCENT: 5.01 %
MDC IDC STAT BRADY AS VS PERCENT: 0.15 %

## 2017-07-03 ENCOUNTER — Encounter: Payer: Self-pay | Admitting: Cardiology

## 2017-07-04 DIAGNOSIS — B0059 Other herpesviral disease of eye: Secondary | ICD-10-CM | POA: Diagnosis not present

## 2017-07-04 DIAGNOSIS — Z79899 Other long term (current) drug therapy: Secondary | ICD-10-CM | POA: Diagnosis not present

## 2017-07-07 DIAGNOSIS — B029 Zoster without complications: Secondary | ICD-10-CM | POA: Diagnosis not present

## 2017-07-08 ENCOUNTER — Ambulatory Visit (HOSPITAL_COMMUNITY)
Admission: RE | Admit: 2017-07-08 | Discharge: 2017-07-08 | Disposition: A | Discharge status: Home / Self Care | Payer: Medicare Other | Source: Ambulatory Visit | Attending: Family Medicine | Admitting: Family Medicine

## 2017-07-08 DIAGNOSIS — Z1231 Encounter for screening mammogram for malignant neoplasm of breast: Secondary | ICD-10-CM | POA: Insufficient documentation

## 2017-07-13 ENCOUNTER — Encounter: Payer: Self-pay | Admitting: Gastroenterology

## 2017-07-20 ENCOUNTER — Other Ambulatory Visit: Payer: Self-pay | Admitting: Cardiology

## 2017-07-27 ENCOUNTER — Telehealth: Payer: Self-pay

## 2017-07-27 ENCOUNTER — Ambulatory Visit (INDEPENDENT_AMBULATORY_CARE_PROVIDER_SITE_OTHER): Payer: Medicare Other

## 2017-07-27 DIAGNOSIS — I5022 Chronic systolic (congestive) heart failure: Secondary | ICD-10-CM | POA: Diagnosis not present

## 2017-07-27 DIAGNOSIS — Z95810 Presence of automatic (implantable) cardiac defibrillator: Secondary | ICD-10-CM

## 2017-07-27 NOTE — Progress Notes (Signed)
EPIC Encounter for ICM Monitoring  Patient Name: Nicole Clements is a 76 y.o. female Date: 07/27/2017 Primary Care Physican: Mayra Neer, MD Primary Cardiologist:Branch Electrophysiologist: Caryl Comes Dry Weight: Previous weight 147lbs Bi-V Pacing: 96.4%   Since 25-Jun-2017: VT-NS (>4 beats, >200 bpm) 1      Attempted call to patient and unable to reach.  Left detailed message regarding transmission.  Transmission reviewed.    Thoracic impedance normal.  Prescribed dosage: Furosemide 80 mg 1 tablet daily and Potassium 20 mEq 1 tablet daily  Lab 02/09/2017 Creatinine 1.22, BUN 11, Potassium 4.2, Sodium 138 07/22/2016 Creatinine 1.30, BUN 12, Potassium 4.6, Sodium 138, EGFR 40-49  Recommendations: Left voice mail with ICM number and encouraged to call for fluid symptoms.  Follow-up plan: ICM clinic phone appointment on 08/27/2017.  Office appointment scheduled 08/18/2017 with Dr. Harl Bowie.  Copy of ICM check sent to Dr. Caryl Comes.   3 month ICM trend: 07/27/2017   1 Year ICM trend:      Rosalene Billings, RN 07/27/2017 10:47 AM

## 2017-07-27 NOTE — Telephone Encounter (Signed)
Remote ICM transmission received.  Attempted call to patient and left detailed message regarding transmission and next ICM scheduled for 08/27/2017.  Advised to return call for any fluid symptoms or questions.    

## 2017-08-10 DIAGNOSIS — I5022 Chronic systolic (congestive) heart failure: Secondary | ICD-10-CM | POA: Diagnosis not present

## 2017-08-10 DIAGNOSIS — E782 Mixed hyperlipidemia: Secondary | ICD-10-CM | POA: Diagnosis not present

## 2017-08-10 DIAGNOSIS — E063 Autoimmune thyroiditis: Secondary | ICD-10-CM | POA: Diagnosis not present

## 2017-08-10 DIAGNOSIS — I25119 Atherosclerotic heart disease of native coronary artery with unspecified angina pectoris: Secondary | ICD-10-CM | POA: Diagnosis not present

## 2017-08-10 DIAGNOSIS — I340 Nonrheumatic mitral (valve) insufficiency: Secondary | ICD-10-CM | POA: Diagnosis not present

## 2017-08-10 DIAGNOSIS — N183 Chronic kidney disease, stage 3 (moderate): Secondary | ICD-10-CM | POA: Diagnosis not present

## 2017-08-10 DIAGNOSIS — Z23 Encounter for immunization: Secondary | ICD-10-CM | POA: Diagnosis not present

## 2017-08-10 DIAGNOSIS — Z720 Tobacco use: Secondary | ICD-10-CM | POA: Diagnosis not present

## 2017-08-10 DIAGNOSIS — Z950 Presence of cardiac pacemaker: Secondary | ICD-10-CM | POA: Diagnosis not present

## 2017-08-10 DIAGNOSIS — I129 Hypertensive chronic kidney disease with stage 1 through stage 4 chronic kidney disease, or unspecified chronic kidney disease: Secondary | ICD-10-CM | POA: Diagnosis not present

## 2017-08-10 DIAGNOSIS — Z0000 Encounter for general adult medical examination without abnormal findings: Secondary | ICD-10-CM | POA: Diagnosis not present

## 2017-08-18 ENCOUNTER — Ambulatory Visit (INDEPENDENT_AMBULATORY_CARE_PROVIDER_SITE_OTHER): Payer: Medicare Other | Admitting: Cardiology

## 2017-08-18 ENCOUNTER — Encounter: Payer: Self-pay | Admitting: Cardiology

## 2017-08-18 VITALS — BP 116/74 | HR 87 | Ht 62.0 in | Wt 156.0 lb

## 2017-08-18 DIAGNOSIS — I340 Nonrheumatic mitral (valve) insufficiency: Secondary | ICD-10-CM

## 2017-08-18 DIAGNOSIS — I255 Ischemic cardiomyopathy: Secondary | ICD-10-CM | POA: Diagnosis not present

## 2017-08-18 DIAGNOSIS — I5022 Chronic systolic (congestive) heart failure: Secondary | ICD-10-CM | POA: Diagnosis not present

## 2017-08-18 DIAGNOSIS — I2510 Atherosclerotic heart disease of native coronary artery without angina pectoris: Secondary | ICD-10-CM | POA: Diagnosis not present

## 2017-08-18 DIAGNOSIS — H2513 Age-related nuclear cataract, bilateral: Secondary | ICD-10-CM | POA: Diagnosis not present

## 2017-08-18 MED ORDER — LOSARTAN POTASSIUM 25 MG PO TABS: 25.0000 mg | ORAL_TABLET | Freq: Every day | ORAL | 3 refills | Status: DC

## 2017-08-18 MED ORDER — SPIRONOLACTONE 25 MG PO TABS: 12.5000 mg | ORAL_TABLET | Freq: Two times a day (BID) | ORAL | 3 refills | Status: DC

## 2017-08-18 NOTE — Progress Notes (Signed)
Clinical Summary Nicole Clements is a 76 y.o.female seen today for follow up of the following medical problems.   1. 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 Admit 07/2014 with CHF and troponin elevation - cath 07/2014, LM 30% distal, LAD mid 50%, LCX ostial 50% and mid 50%, small intermediate Demani Weyrauch 70%. RCA diffuse 50%, distal RCA with ulcerated 90% lesion, PDA 80-90% too small for PCI. LVEF 30%. Received DES to distal RCA, post procedure the PDA became occluded.  - echo 07/2014 LVEF 20%, inferior akinesis, grade II diastolic dysfunction. Repeat echo 10/2014 LVEF remains 15-20%.  - 04/2015 CRT-D device placed by Dr Caryl Comes. Normal function Jan 2018.  10/2015 echo LVEF 25-30%  - could not afford entresto, back on losartan.   - no recent SOB/DOE. No recent edema - compliant with meds - home weights stable around 150-152 lbs.   2. Mitral regurgitation - mild by most recent echo - she denies any recent symptoms.   3. Hyperlipidemia - she is compliant with pravastatin 64m daily - 07/2016 TC 102 HDL 40 TG 88 LDL 45  - atorva was too expensive, changed to pravastatin by pcp    [place]    Past Medical History:  Diagnosis Date  . CAD (coronary artery disease) April 2014   DES to PLA, occluded PDA 07/2014  . [place]   . Hypercholesterolemia   . Hypertension   . Ischemic cardiomyopathy    LVEF 25%-45%  . IVCD (intraventricular conduction defect)   . PVC's (premature ventricular contractions)   . S/P colonoscopy August 2007   Hyperplastic polyps, rare sigmoid and descending colon diverticulosis, small internal hemorrhoids  . STEMI (ST elevation myocardial infarction) ([place] 07/22/1996     Allergies  Allergen Reactions  . Lactose Intolerance (Gi) Other (See Comments)    GI Upset   . Sulfa Antibiotics Rash     Current Outpatient Prescriptions  Medication Sig Dispense Refill    . acetaminophen (TYLENOL) 325 MG tablet Take 2 tablets (650 mg total) by mouth every 4 (four) hours as needed for headache or mild pain.    .Marland Kitchenaspirin EC 81 MG tablet Take 1 tablet (81 mg total) by mouth daily. 90 tablet 3  . atorvastatin (LIPITOR) 80 MG tablet Take 1 tablet (80 mg total) by mouth daily. 90 tablet 3  . carvedilol (COREG) 25 MG tablet take 1 tablet by mouth twice a day 60 tablet 6  . Cholecalciferol (VITAMIN D3) 2000 UNITS capsule Take 2,000 Units by mouth daily.      . furosemide (LASIX) 80 MG tablet take 1 tablet by mouth once daily 30 tablet 3  . losartan (COZAAR) 25 MG tablet Take 1 tablet (25 mg total) by mouth daily. 90 tablet 3  . nitroGLYCERIN (NITROSTAT) 0.4 MG SL tablet Place 1 tablet (0.4 mg total) under the tongue every 5 (five) minutes x 3 doses as needed for chest pain. 25 tablet 2  . Omega-3 Fatty Acids (FISH OIL) 1000 MG CAPS Take 1,000 mg by mouth daily.     . potassium chloride SA (K-DUR,KLOR-CON) 20 MEQ tablet take 1 tablet by mouth once daily 90 tablet 2  . spironolactone (ALDACTONE) 25 MG tablet TAKE 1/2 TABLET TWICE A DAY 30 tablet 3   No current facility-administered medications for this visit.      Past Surgical History:  Procedure Laterality Date  . CARDIAC CATHETERIZATION  April 2014  Med Rx  . COLONOSCOPY N/A 12/24/2015   Dr. Fields:moderate sized internal hemorrhoids/moderate diverticulosis, negative microscopic colitis   . EP IMPLANTABLE DEVICE  04/18/2015   BV ICD  . EP IMPLANTABLE DEVICE N/A 04/18/2015   Procedure: BiV ICD Insertion CRT-D;  Surgeon: Deboraha Sprang, MD;  Location: [facility];  Service: Cardiovascular;  Laterality: N/A;  . LEFT AND RIGHT HEART CATHETERIZATION WITH CORONARY ANGIOGRAM N/A 07/17/2014   Procedure: LEFT AND RIGHT HEART CATHETERIZATION WITH CORONARY ANGIOGRAM;  Surgeon: Blane Ohara, MD;  Location: [facility] CATH LAB;  Service: Cardiovascular;  Laterality: N/A;  . PERCUTANEOUS CORONARY STENT INTERVENTION (PCI-S)   07/17/2014   Procedure: PERCUTANEOUS CORONARY STENT INTERVENTION (PCI-S);  Surgeon: Blane Ohara, MD;  Location: [facility] CATH LAB;  Service: Cardiovascular;;  Distal RCA  . S/P Hysterectomy       Allergies  Allergen Reactions  . Lactose Intolerance (Gi) Other (See Comments)    GI Upset   . Sulfa Antibiotics Rash      Family History  Problem Relation Age of Onset  . Hypertension Sister   . Diabetes Mellitus II Sister   . Hypertension Brother   . Diabetes Mellitus II Brother   . Hypertension Brother   . Diabetes Mellitus II Brother   . Hypertension Brother   . Diabetes Mellitus II Brother   . Colon cancer Neg Hx      Social History Nicole Clements reports that she has been smoking Cigarettes.  She started smoking about 55 years ago. She has a 25.00 pack-year smoking history. She has never used smokeless tobacco. Nicole Clements reports that she does not drink alcohol.   Review of Systems CONSTITUTIONAL: No weight loss, fever, chills, weakness or fatigue.  HEENT: Eyes: No visual loss, blurred vision, double vision or yellow sclerae.No hearing loss, sneezing, congestion, runny nose or sore throat.  SKIN: No rash or itching.  CARDIOVASCULAR: per hpi RESPIRATORY: per hpi GASTROINTESTINAL: No anorexia, nausea, vomiting or diarrhea. No abdominal pain or blood.  GENITOURINARY: No burning on urination, no polyuria NEUROLOGICAL: No headache, dizziness, syncope, paralysis, ataxia, numbness or tingling in the extremities. No change in bowel or bladder control.  MUSCULOSKELETAL: No muscle, back pain, joint pain or stiffness.  LYMPHATICS: No enlarged nodes. No history of splenectomy.  PSYCHIATRIC: No history of depression or anxiety.  ENDOCRINOLOGIC: No reports of sweating, cold or heat intolerance. No polyuria or polydipsia.  [name]   Physical Examination Vitals:   08/18/17 1302  BP: 116/74  Pulse: 87  SpO2: 97%   Vitals:   08/18/17 1302  Weight: 156 lb (70.8 kg)  Height: 5' 2"  (1.575 m)     Gen: resting comfortably, no acute distress HEENT: no scleral icterus, pupils equal round and reactive, no palptable cervical adenopathy,  CV: RRR, no m/r/g, no jvd Resp: Clear to auscultation bilaterally GI: abdomen is soft, non-tender, non-distended, normal bowel sounds, no hepatosplenomegaly MSK: extremities are warm, no edema.  Skin: warm, no rash Neuro:  no focal deficits Psych: appropriate affect   Diagnostic Studies 02/2013 Cath Procedural Findings:  Hemodynamics:  AO 116/45 with a mean of 70  LV 120/18  Coronary angiography:  Coronary dominance: right  Left mainstem: The left main is patent with 30% distal left main stenosis as the vessel trifurcates into the LAD, intermediate Kenlea Woodell, and left circumflex.  Left anterior descending (LAD): The LAD is patent with diffuse disease noted. There is minimal calcification present. The first diagonal is patent with 30-50% diffuse disease. The LAD  after the first septal perforator has 40-50% stenosis. The vessel reaches the apex and wraps around the left ventricular apex without significant stenosis.  Left circumflex (LCx): The intermediate Sirena Riddle is small in caliber and diffusely diseased with 50-60% proximal vessel stenosis the AV groove circumflex is diffusely diseased with 50% ostial stenosis and 30% mid stenosis it supplies 2 small posterolateral Marialice Newkirk  Right coronary artery (RCA): The RCA is diffusely diseased. There is significant tortuosity, especially around the junction of the mid and distal vessel. The vessel is diffusely calcified. The mid vessel has tandem 50% stenoses. The distal vessel has 80-90 % stenosis just before the bifurcation of the PDA and posterior AV segment. The PDA and posterior AV segment with 3 posterolateral branches are patent.  Left ventriculography: Left ventricular systolic function is moderately depressed. There is severe hypokinesis of the basal and midinferior walls. The anterolateral and  apical walls contract normally. The left ventricular ejection fraction is estimated at 45%.  Abdominal aortogram: The abdominal aorta is patent. The renal arteries are single bilaterally and they are widely patent. There appears to be nonobstructive stenosis of the right common iliac artery at the aortoiliac junction. The left common iliac artery appears to have significant ostial stenosis estimated at at least 75% angiographically. The visualized portions of the external iliac and common femoral arteries are patent  Final Conclusions:  1. Severe single-vessel coronary artery disease involving the distal right coronary artery 2. Moderate LV dysfunction with segmental severe hypokinesis of the inferior wall and normal contraction of the antero-apex with estimated left ventricular ejection fraction of 45%. 3. Severe left common iliac artery stenosis Recommendations: The patient has severe distal RCA disease. She has a known history of inferior wall infarction and remote PCA. This fits with that clinical history. She has nonobstructive disease of the left coronary artery. Her left ventricular ejection fraction by ventriculography is clearly greater than 35%. With her history of previous MI, I do not think PCI is indicated with an absence of angina. Will carefully discussed symptoms with the patient, but I am inclined to treat her medically. Will also review indication for consideration of peripheral intervention for her iliac disease.   01/2013 Echo Study Conclusions  - Left ventricle: The cavity size was mildly to moderately dilated. Wall thickness was normal. Systolic function was severely reduced. The estimated ejection fraction was 25%. Severe diffuse hypokinesis. - Aortic valve: Mildly calcified annulus. Trileaflet. - Mitral valve: Calcified annulus. - Left atrium: The atrium was moderately dilated. - Right ventricle: The cavity size was normal. Wall thickness was mildly to  moderately increased. - Right atrium: The atrium was mildly dilated. - Atrial septum: No defect or patent foramen ovale was identified. Impressions:  - Compared to the prior study of 03/13/11, there has been substantial deterioration in LV systolic function.  07/2014 Echo Study Conclusions  - Left ventricle: The cavity size was severely dilated. Wall thickness was normal. The estimated ejection fraction was 20%. Diffuse hypokinesis. There is akinesis of the inferolateral and inferior myocardium. Features are consistent with a pseudonormal left ventricular filling pattern, with concomitant abnormal relaxation and increased filling pressure (grade 2 diastolic dysfunction). Doppler parameters are consistent with high ventricular filling pressure. - Mitral valve: There was mild to moderate regurgitation. - Left atrium: The atrium was moderately dilated.  Impressions:  - Global hypokinesis; inferior and inferolateral akinesis; overall severely reduced LV function; grade 2 diastolic dysfunction with elevated left ventricular filling pressures, moderate LAE; mild to moderate MR. 07/2014 Cath  PROCEDURAL FINDINGS  Hemodynamics:  AO 122/48  LV 118/14  RA 9  RV 41/11  PA 37/5 with a mean of 21  PCWP 27  Oxygen saturations  AO 90  PA 56  Cardiac output 3.5  Cardiac index 2.0  Coronary angiography:  Coronary dominance: right  Left mainstem: The left mainstem is patent with 30% distal left main stenosis.  Left anterior descending (LAD): The LAD is diffusely diseased in the midportion. There are sequential 50% stenoses, unchanged from previous study. The first diagonal Oluwadarasimi Redmon is large in distribution with diffuse nonobstructive disease noted. The mid and distal LAD are patent without significant disease.  Left circumflex (LCx): The left circumflex has 50% ostial stenosis, 50% mid vessel stenosis, and to OM branches with no significant disease. There is an intermediate  Evie Crumpler is diffusely diseased with up to 70% stenosis. This vessel is small in caliber.  Right coronary artery (RCA): Dominant vessel. Tortuous with diffuse nonobstructive stenosis throughout the proximal and mid portions. There are diffuse 50% stenosis present. The distal vessel has an ulcerated-appearing irregular 90% stenosis involving the bifurcation of the PDA and posterolateral branches. The posterolateral branches are patent with mild diffuse disease. The PDA arises an acute angulation with 30-40% stenosis. The mid body of the PDA has an 80-90% stenosis in an area where the vessel is too small for PCI.  Left ventriculography: The ventricle appears the synchronous. The basal and midinferior wall are akinetic. The basal inferolateral wall is severely hypokinetic. The anterolateral wall and apex contract normally. The estimated LVEF is 30%.  PCI Procedure Note: Following the diagnostic procedure, the decision was made to proceed with PCI of the distal RCA. The lesion is complex, there is a large amount of myocardium involved. I felt there was a risk of potentially compromising the PDA Nancy Manuele, but overall in my opinion the risk/benefit was favorable for PCI considering the severe stenosis involving the entire RCA distribution. The sheath was upsized to a 6 [place]. Weight-based bivalirudin was given for anticoagulation. The patient was loaded with Plavix 600 mg on the table. Once a therapeutic ACT was achieved, a 6 [place] JR 4 guide catheter was inserted. A cougar coronary guidewire was used to cross the lesion into the PLA Jacia Sickman. The lesion was predilated with a 2.5 x 15 mm balloon. The lesion was then stented with a 3.0 by 109m Promus DES stent. The stent was postdilated with a 3.25 mm noncompliant balloon to 18 atmospheres. The PDA was stented across as the stent extended from the distal RCA proximal PLA Korrina Zern. The PDA was totally occluded. I made a long attempt with a whisper wire using multiple bands  on the tip of the wire, but all of these attempts were unsuccessful at rewiring the PDA. The patient was completely asymptomatic and specifically denied any chest pain. Her hemodynamics remained stable and there were no changes in her rhythm or blood pressure. A final image of the left coronary artery was obtained and this demonstrated a collateral from the LAD to the PDA Soren Lazarz. Following PCI, there was 0% residual stenosis and TIMI-3 flow. Final angiography confirmed an excellent result. Femoral hemostasis was achieved will be achieved with manual hemostasis. The patient tolerated the PCI procedure well. There were no immediate procedural complications. The patient was transferred to the post catheterization recovery area for further monitoring.  PCI Data:  Vessel - RCA/Segment - distal  Percent Stenosis (pre) 90  TIMI-flow 3  Stent 3.0 by 170mPromus DES  Percent  Stenosis (post) 0  TIMI-flow (post) 3  Contrast: 175 cc Omnipaque  Radiation dose/Fluoro time: 15.5 minutes  Estimated Blood Loss: Minimal  Final Conclusions:  1. Nonobstructive disease involving the LAD, left circumflex, and OM/diagonal side branches 2. Severe stenosis of the distal RCA bifurcation, treated successfully with a drug-eluting stent into the PLA Dustin Burrill with residual occlusion of the PDA Tyan Lasure 3. Severe segmental LV systolic dysfunction Recommendations: Dual antiplatelet therapy with aspirin and Plavix for at least 12 months. Will cycle cardiac enzymes since the PDA Dezzie Badilla is occluded after stenting.  10/2014 Echo Study Conclusions  - Procedure narrative: Transthoracic echocardiography. Image quality was suboptimal. The study was technically difficult, as a result of poor sound wave transmission and body habitus. - Left ventricle: Systolic function is severely reduced, estimated EF 15-20%. The cavity size was severely dilated. Doppler parameters are consistent with abnormal left  ventricular relaxation (grade 1 diastolic dysfunction). Doppler parameters are consistent with high ventricular filling pressure. Medial E/e&' 24. - Regional wall motion abnormality: Akinesis of the basal-mid inferior and basal inferolateral myocardium; severe hypokinesis of the apical inferior and mid inferolateral myocardium. Severe, diffuse hypokinesis. - Ventricular septum: Septal motion showed abnormal function and dyssynergy. These changes are consistent with intraventricular conduction delay. - Aortic valve: Mildly calcified annulus. Trileaflet; mildly thickened leaflets. - Mitral valve: Mildly dilated annulus. Mildly thickened leaflets . There was moderate eccentric regurgitation. 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 - Left atrium: The atrium was severely dilated. Volume/bsa, S: 47.1 ml/m^2. - Right ventricle: Systolic function was mildly reduced. - Right atrium: The atrium was mildly dilated. - Atrial septum: There was increased thickness of the septum, consistent with lipomatous hypertrophy. - Tricuspid valve: There was mild regurgitation. - Pulmonary arteries: PA peak pressure: 42 mm Hg (S). Mildly elevated pulmonary pressures. - Pericardium, extracardiac: There was a small pericardial effusion, with no evidence of tamponade physiology.  04/2015 Carotid US IMPRESSION: 1. Mild (1-49%) stenosis of the proximal right internal carotid artery secondary to smooth heterogeneous atherosclerotic plaque. No interval change compared to recent prior imaging from earlier this month. 2. Mild (1-49%) stenosis of the proximal left internal carotid artery secondary to heterogeneous and irregular atherosclerotic plaque. No interval change compared to recent prior imaging from earlier this month. 3. Vertebral arteries remain patent with normal antegrade flow. Signed,    10/2015 echo Study Conclusions  - Left ventricle: The cavity  size was moderately to severely dilated. Wall thickness was normal. Systolic function was severely reduced. The estimated ejection fraction was in the range of 25% to 30%. Diffuse hypokinesis. There is akinesis of the basalanteroseptal and anterior myocardium. Doppler parameters are consistent with abnormal left ventricular relaxation (grade 1 diastolic dysfunction). - Mitral valve: Calcified annulus. Mildly calcified leaflets . There was mild regurgitation. - Right atrium: Central venous pressure (est): 3 mm Hg. - Atrial septum: No defect or patent foramen ovale was identified. - Tricuspid valve: There was trivial regurgitation. - Pulmonary arteries: PA peak pressure: 27 mm Hg (S). - Pericardium, extracardiac: A prominent pericardial fat pad was present.  Impressions:  - Moderate to severe LV chamber dilatation with LVEF approximately 25-30%. There is diffuse hypokinesis with near akinesis of the basal anteroseptal and anterior myocardium. Compared to the previous study from June 2016 there has been some improvement in LVEF. Grade 1 diastolic dysfunction. MAC with mild mitral regurgitation. Trivial tricuspid regurgitation with PASP 27 mmHg.  Transthoracic echocardiography. M-mode, complete 2D, spectral Doppler, and color Doppler. Birthdate: Patient birthdate: [birth year]. Age: Patient  is 76 yr old. Sex: Gender: female. BMI: 27.5 kg/m^2. Blood pressure:   98/47 Patient status: Inpatient. Study date: Study date: 10/04/2015. Study time: 11:27 AM. Location: Echo laboratory.    Assessment and Plan   1. CAD - no symptoms, continue current meds  2. Chronic systolic HF - no recent symptoms, she will continue current meds. Medical therapy limited by soft bp's.    3. Mitral regurgitation - mild by last echo, we will continue to monitor.   4. Hyperlipidemia - statin changed recently due to cost, continue current therapy.   5.  History of CVA - continue secondary prevention  F/u 6 months   Arnoldo Lenis, M.D

## 2017-08-18 NOTE — Patient Instructions (Signed)

## 2017-08-27 ENCOUNTER — Ambulatory Visit (INDEPENDENT_AMBULATORY_CARE_PROVIDER_SITE_OTHER): Payer: Medicare Other

## 2017-08-27 DIAGNOSIS — I5022 Chronic systolic (congestive) heart failure: Secondary | ICD-10-CM | POA: Diagnosis not present

## 2017-08-27 DIAGNOSIS — Z95810 Presence of automatic (implantable) cardiac defibrillator: Secondary | ICD-10-CM

## 2017-08-27 NOTE — Progress Notes (Signed)
EPIC Encounter for ICM Monitoring  Patient Name: JOJO GEVING is a 76 y.o. female Date: 08/27/2017 Primary Care Physican: Mayra Neer, MD Primary Cardiologist:Branch Electrophysiologist: Caryl Comes Dry Weight:147lbs Bi-V Pacing: 96.2%       Heart Failure questions reviewed, pt asymptomatic.   Thoracic impedance normal.  Prescribed dosage: Furosemide 80 mg 1 tablet daily and Potassium 20 mEq 1 tablet daily  Lab 02/09/2017 Creatinine 1.22, BUN 11, Potassium 4.2, Sodium 138 07/22/2016 Creatinine 1.30, BUN 12, Potassium 4.6, Sodium 138, EGFR 40-49  Recommendations: No changes.  Advised to limit salt intake to 2000 mg/day and fluid intake to < 2 liters/day.  Encouraged to call for fluid symptoms.  Follow-up plan: ICM clinic phone appointment on 10/01/2017.    Copy of ICM check sent to Dr. Caryl Comes.   3 month ICM trend: 08/27/2017   1 Year ICM trend:      Rosalene Billings, RN 08/27/2017 2:28 PM

## 2017-09-04 NOTE — Patient Instructions (Signed)
Your procedure is scheduled on: 09/15/2017   Report to [facility] at  645   AM.  Call this number if you have problems the morning of surgery: [phone]   Do not eat food or drink liquids :After Midnight.      Take these medicines the morning of surgery with A SIP OF WATER: coreg, cozaar.   Do not wear jewelry, make-up or nail polish.  Do not wear lotions, powders, or perfumes. You may wear deodorant.  Do not shave 48 hours prior to surgery.  Do not bring valuables to the hospital.  Contacts, dentures or bridgework may not be worn into surgery.  Leave suitcase in the car. After surgery it may be brought to your room.  For patients admitted to the hospital, checkout time is 11:00 AM the day of discharge.   Patients discharged the day of surgery will not be allowed to drive home.  :     Please read over the following fact sheets that you were given: Coughing and Deep Breathing, Surgical Site Infection Prevention, Anesthesia Post-op Instructions and Care and Recovery After Surgery    Cataract A cataract is a clouding of the lens of the eye. When a lens becomes cloudy, vision is reduced based on the degree and nature of the clouding. Many cataracts reduce vision to some degree. Some cataracts make people more near-sighted as they develop. Other cataracts increase glare. Cataracts that are ignored and become worse can sometimes look white. The white color can be seen through the pupil. CAUSES   Aging. However, cataracts may occur at any age, even in newborns.   Certain drugs.   Trauma to the eye.   Certain diseases such as diabetes.   Specific eye diseases such as chronic inflammation inside the eye or a sudden attack of a rare form of glaucoma.   Inherited or acquired medical problems.  SYMPTOMS   Gradual, progressive drop in vision in the affected eye.   Severe, rapid visual loss. This most often happens when trauma is the cause.  DIAGNOSIS  To detect a cataract, an eye  doctor examines the lens. Cataracts are best diagnosed with an exam of the eyes with the pupils enlarged (dilated) by drops.  TREATMENT  For an early cataract, vision may improve by using different eyeglasses or stronger lighting. If that does not help your vision, surgery is the only effective treatment. A cataract needs to be surgically removed when vision loss interferes with your everyday activities, such as driving, reading, or watching TV. A cataract may also have to be removed if it prevents examination or treatment of another eye problem. Surgery removes the cloudy lens and usually replaces it with a substitute lens (intraocular lens, IOL).  At a time when both you and your doctor agree, the cataract will be surgically removed. If you have cataracts in both eyes, only one is usually removed at a time. This allows the operated eye to heal and be out of danger from any possible problems after surgery (such as infection or poor wound healing). In rare cases, a cataract may be doing damage to your eye. In these cases, your caregiver may advise surgical removal right away. The vast majority of people who have cataract surgery have better vision afterward. HOME CARE INSTRUCTIONS  If you are not planning surgery, you may be asked to do the following:  Use different eyeglasses.   Use stronger or brighter lighting.   Ask your eye doctor about reducing your  medicine dose or changing medicines if it is thought that a medicine caused your cataract. Changing medicines does not make the cataract go away on its own.   Become familiar with your surroundings. Poor vision can lead to injury. Avoid bumping into things on the affected side. You are at a higher risk for tripping or falling.   Exercise extreme care when driving or operating machinery.   Wear sunglasses if you are sensitive to bright light or experiencing problems with glare.  SEEK IMMEDIATE MEDICAL CARE IF:   You have a worsening or sudden  vision loss.   You notice redness, swelling, or increasing pain in the eye.   You have a fever.  Document Released: 10/20/2005 Document Revised: 10/09/2011 Document Reviewed: 06/13/2011 [facility] Patient Information 2012 [place].PATIENT INSTRUCTIONS POST-ANESTHESIA  IMMEDIATELY FOLLOWING SURGERY:  Do not drive or operate machinery for the first twenty four hours after surgery.  Do not make any important decisions for twenty four hours after surgery or while taking narcotic pain medications or sedatives.  If you develop intractable nausea and vomiting or a severe headache please notify your doctor immediately.  FOLLOW-UP:  Please make an appointment with your surgeon as instructed. You do not need to follow up with anesthesia unless specifically instructed to do so.  WOUND CARE INSTRUCTIONS (if applicable):  Keep a dry clean dressing on the anesthesia/puncture wound site if there is drainage.  Once the wound has quit draining you may leave it open to air.  Generally you should leave the bandage intact for twenty four hours unless there is drainage.  If the epidural site drains for more than 36-48 hours please call the anesthesia department.  QUESTIONS?:  Please feel free to call your physician or the hospital operator if you have any questions, and they will be happy to assist you.

## 2017-09-10 ENCOUNTER — Other Ambulatory Visit: Payer: Self-pay | Admitting: Cardiology

## 2017-09-11 ENCOUNTER — Encounter (HOSPITAL_COMMUNITY)
Admission: RE | Admit: 2017-09-11 | Discharge: 2017-09-11 | Disposition: A | Discharge status: Home / Self Care | Payer: Medicare Other | Source: Ambulatory Visit | Attending: Ophthalmology | Admitting: Ophthalmology

## 2017-09-11 ENCOUNTER — Encounter (HOSPITAL_COMMUNITY): Payer: Self-pay

## 2017-09-11 ENCOUNTER — Other Ambulatory Visit: Payer: Self-pay

## 2017-09-11 DIAGNOSIS — Z01818 Encounter for other preprocedural examination: Secondary | ICD-10-CM | POA: Diagnosis not present

## 2017-09-11 DIAGNOSIS — H269 Unspecified cataract: Secondary | ICD-10-CM | POA: Insufficient documentation

## 2017-09-11 HISTORY — DX: Presence of automatic (implantable) cardiac defibrillator: Z95.810

## 2017-09-11 LAB — BASIC METABOLIC PANEL
Anion gap: 8 (ref 5–15)
BUN: 10 mg/dL (ref 6–20)
CHLORIDE: 105 mmol/L (ref 101–111)
CO2: 23 mmol/L (ref 22–32)
Calcium: 9.5 mg/dL (ref 8.9–10.3)
Creatinine, Ser: 1.19 mg/dL — ABNORMAL HIGH (ref 0.44–1.00)
GFR, EST AFRICAN AMERICAN: 51 mL/min — AB (ref >60)
GFR, EST NON AFRICAN AMERICAN: 44 mL/min — AB (ref >60)
Glucose, Bld: 92 mg/dL (ref 65–99)
POTASSIUM: 4.6 mmol/L (ref 3.5–5.1)
SODIUM: 136 mmol/L (ref 135–145)

## 2017-09-11 LAB — CBC WITH DIFFERENTIAL/PLATELET
Basophils Absolute: 0 10*3/uL (ref 0.0–0.1)
Basophils Relative: 0 %
EOS ABS: 0.1 10*3/uL (ref 0.0–0.7)
EOS PCT: 2 %
HCT: 37.6 % (ref 36.0–46.0)
HEMOGLOBIN: 12.1 g/dL (ref 12.0–15.0)
LYMPHS ABS: 2.6 10*3/uL (ref 0.7–4.0)
Lymphocytes Relative: 43 %
MCH: 30.7 pg (ref 26.0–34.0)
MCHC: 32.2 g/dL (ref 30.0–36.0)
MCV: 95.4 fL (ref 78.0–100.0)
Monocytes Absolute: 0.5 10*3/uL (ref 0.1–1.0)
Monocytes Relative: 8 %
NEUTROS PCT: 47 %
Neutro Abs: 2.9 10*3/uL (ref 1.7–7.7)
PLATELETS: 207 10*3/uL (ref 150–400)
RBC: 3.94 MIL/uL (ref 3.87–5.11)
RDW: 13.6 % (ref 11.5–15.5)
WBC: 6.1 10*3/uL (ref 4.0–10.5)

## 2017-09-15 ENCOUNTER — Ambulatory Visit (HOSPITAL_COMMUNITY): Payer: Medicare Other | Admitting: Anesthesiology

## 2017-09-15 ENCOUNTER — Encounter (HOSPITAL_COMMUNITY): Payer: Self-pay | Admitting: *Deleted

## 2017-09-15 ENCOUNTER — Encounter (HOSPITAL_COMMUNITY)
Admission: RE | Disposition: A | Discharge status: Home / Self Care | Payer: Self-pay | Source: Ambulatory Visit | Attending: Ophthalmology

## 2017-09-15 ENCOUNTER — Ambulatory Visit (HOSPITAL_COMMUNITY)
Admission: RE | Admit: 2017-09-15 | Discharge: 2017-09-15 | Disposition: A | Discharge status: Home / Self Care | Payer: Medicare Other | Source: Ambulatory Visit | Attending: Ophthalmology | Admitting: Ophthalmology

## 2017-09-15 DIAGNOSIS — I252 Old myocardial infarction: Secondary | ICD-10-CM | POA: Diagnosis not present

## 2017-09-15 DIAGNOSIS — Z79899 Other long term (current) drug therapy: Secondary | ICD-10-CM | POA: Insufficient documentation

## 2017-09-15 DIAGNOSIS — Z8673 Personal history of transient ischemic attack (TIA), and cerebral infarction without residual deficits: Secondary | ICD-10-CM | POA: Diagnosis not present

## 2017-09-15 DIAGNOSIS — H2512 Age-related nuclear cataract, left eye: Secondary | ICD-10-CM | POA: Diagnosis not present

## 2017-09-15 DIAGNOSIS — H269 Unspecified cataract: Secondary | ICD-10-CM | POA: Diagnosis not present

## 2017-09-15 DIAGNOSIS — Z7982 Long term (current) use of aspirin: Secondary | ICD-10-CM | POA: Insufficient documentation

## 2017-09-15 DIAGNOSIS — Z95810 Presence of automatic (implantable) cardiac defibrillator: Secondary | ICD-10-CM | POA: Insufficient documentation

## 2017-09-15 DIAGNOSIS — I509 Heart failure, unspecified: Secondary | ICD-10-CM | POA: Diagnosis not present

## 2017-09-15 DIAGNOSIS — H2511 Age-related nuclear cataract, right eye: Secondary | ICD-10-CM | POA: Diagnosis not present

## 2017-09-15 DIAGNOSIS — Z955 Presence of coronary angioplasty implant and graft: Secondary | ICD-10-CM | POA: Diagnosis not present

## 2017-09-15 DIAGNOSIS — I110 Hypertensive heart disease with heart failure: Secondary | ICD-10-CM | POA: Insufficient documentation

## 2017-09-15 DIAGNOSIS — F17200 Nicotine dependence, unspecified, uncomplicated: Secondary | ICD-10-CM | POA: Diagnosis not present

## 2017-09-15 DIAGNOSIS — I2510 Atherosclerotic heart disease of native coronary artery without angina pectoris: Secondary | ICD-10-CM | POA: Diagnosis not present

## 2017-09-15 HISTORY — PX: CATARACT EXTRACTION W/PHACO: SHX586

## 2017-09-15 SURGERY — PHACOEMULSIFICATION, CATARACT, WITH IOL INSERTION
Anesthesia: Monitor Anesthesia Care | Site: Eye | Laterality: Right

## 2017-09-15 MED ORDER — MIDAZOLAM HCL 2 MG/2ML IJ SOLN
INTRAMUSCULAR | Status: AC
  Filled 2017-09-15: qty 2

## 2017-09-15 MED ORDER — FENTANYL CITRATE (PF) 100 MCG/2ML IJ SOLN
INTRAMUSCULAR | Status: AC
  Filled 2017-09-15: qty 2

## 2017-09-15 MED ORDER — TETRACAINE 0.5 % OP SOLN OPTIME - NO CHARGE
OPHTHALMIC | Status: DC | PRN
  Administered 2017-09-15: 2 [drp] via OPHTHALMIC

## 2017-09-15 MED ORDER — TETRACAINE HCL 0.5 % OP SOLN
1.0000 [drp] | OPHTHALMIC | Status: AC
Start: 2017-09-15 — End: 2017-09-15
  Administered 2017-09-15 (×3): 1 [drp] via OPHTHALMIC

## 2017-09-15 MED ORDER — PHENYLEPHRINE HCL 2.5 % OP SOLN
1.0000 [drp] | OPHTHALMIC | Status: AC
  Administered 2017-09-15 (×3): 1 [drp] via OPHTHALMIC

## 2017-09-15 MED ORDER — FENTANYL CITRATE (PF) 100 MCG/2ML IJ SOLN
25.0000 ug | Freq: Once | INTRAMUSCULAR | Status: AC
  Administered 2017-09-15: 25 ug via INTRAVENOUS

## 2017-09-15 MED ORDER — LIDOCAINE HCL (PF) 1 % IJ SOLN
INTRAMUSCULAR | Status: DC | PRN
Start: 2017-09-15 — End: 2017-09-15
  Administered 2017-09-15: .6 mL

## 2017-09-15 MED ORDER — MIDAZOLAM HCL 2 MG/2ML IJ SOLN
1.0000 mg | INTRAMUSCULAR | Status: AC
  Administered 2017-09-15: 2 mg via INTRAVENOUS

## 2017-09-15 MED ORDER — CYCLOPENTOLATE-PHENYLEPHRINE 0.2-1 % OP SOLN
1.0000 [drp] | OPHTHALMIC | Status: AC
  Administered 2017-09-15 (×3): 1 [drp] via OPHTHALMIC

## 2017-09-15 MED ORDER — EPINEPHRINE PF 1 MG/ML IJ SOLN
INTRAMUSCULAR | Status: DC | PRN
  Administered 2017-09-15: 500 mL

## 2017-09-15 MED ORDER — PROVISC 10 MG/ML IO SOLN
INTRAOCULAR | Status: DC | PRN
  Administered 2017-09-15 (×2): 0.85 mL via INTRAOCULAR

## 2017-09-15 MED ORDER — LACTATED RINGERS IV SOLN
INTRAVENOUS | Status: DC
  Administered 2017-09-15: 08:00:00 via INTRAVENOUS

## 2017-09-15 MED ORDER — KETOROLAC TROMETHAMINE 0.5 % OP SOLN
1.0000 [drp] | OPHTHALMIC | Status: AC
  Administered 2017-09-15 (×3): 1 [drp] via OPHTHALMIC

## 2017-09-15 MED ORDER — BSS IO SOLN
INTRAOCULAR | Status: DC | PRN
  Administered 2017-09-15: 15 mL

## 2017-09-15 SURGICAL SUPPLY — 13 items
CLOTH BEACON ORANGE TIMEOUT ST (SAFETY) ×3 IMPLANT
EYE SHIELD UNIVERSAL CLEAR (GAUZE/BANDAGES/DRESSINGS) ×3 IMPLANT
GLOVE BIOGEL PI IND STRL 6.5 (GLOVE) ×1 IMPLANT
GLOVE BIOGEL PI IND STRL 7.0 (GLOVE) ×1 IMPLANT
GLOVE BIOGEL PI INDICATOR 6.5 (GLOVE) ×2
GLOVE BIOGEL PI INDICATOR 7.0 (GLOVE) ×2
LENS ALC ACRYL/TECN (Ophthalmic Related) ×3 IMPLANT
PAD ARMBOARD 7.5X6 YLW CONV (MISCELLANEOUS) ×3 IMPLANT
RING MALYGIN (MISCELLANEOUS) ×3 IMPLANT
TAPE SURG TRANSPORE 1 IN (GAUZE/BANDAGES/DRESSINGS) ×1 IMPLANT
TAPE SURGICAL TRANSPORE 1 IN (GAUZE/BANDAGES/DRESSINGS) ×2
VISCOELASTIC ADDITIONAL (OPHTHALMIC RELATED) ×3 IMPLANT
WATER STERILE IRR 250ML POUR (IV SOLUTION) ×3 IMPLANT

## 2017-09-15 NOTE — H&P (Signed)
The patient was re examined and there is no change in the patients condition since the original H and P. 

## 2017-09-15 NOTE — Anesthesia Postprocedure Evaluation (Signed)
Anesthesia Post Note  Patient: Nicole Clements  Procedure(s) Performed: CATARACT EXTRACTION PHACO AND INTRAOCULAR LENS PLACEMENT ([place]) (Right Eye)  Patient location during evaluation: Short Stay Anesthesia Type: MAC Level of consciousness: awake and alert Pain management: pain level controlled Vital Signs Assessment: post-procedure vital signs reviewed and stable Respiratory status: spontaneous breathing Cardiovascular status: stable Postop Assessment: no apparent nausea or vomiting Anesthetic complications: no     Last Vitals:  Vitals:   09/15/17 0810 09/15/17 0836  BP: (!) 90/52 (!) 102/55  Pulse:  70  Resp: 17   Temp:  36.6 C  SpO2: 91% 96%    Last Pain:  Vitals:   09/15/17 0836  TempSrc: Oral                 Kyston Gonce

## 2017-09-15 NOTE — Anesthesia Procedure Notes (Signed)
Procedure Name: MAC Date/Time: 09/15/2017 8:16 AM Performed by: Vista Deck, CRNA Pre-anesthesia Checklist: Patient identified, Emergency Drugs available, Suction available, Timeout performed and Patient being monitored Patient Re-evaluated:Patient Re-evaluated prior to induction Oxygen Delivery Method: Nasal Cannula

## 2017-09-15 NOTE — Discharge Instructions (Signed)
°  °          [facility] Instructions °[address]- [place] [address] °    ° °1. Avoid closing eyes tightly. One often closes the eye tightly when laughing, talking, sneezing, coughing or if they feel irritated. At these times, you should be careful not to close your eyes tightly. ° °2. Instill eye drops as instructed. To instill drops in your eye, open it, look up and have someone gently pull the lower lid down and instill a couple of drops inside the lower lid. ° °3. Do not touch upper lid. ° °4. Take Advil or Tylenol for pain. ° °5. You may use either eye for near work, such as reading or sewing and you may watch television. ° °6. You may have your hair done at the beauty parlor at any time. ° °7. Wear dark glasses with or without your own glasses if you are in bright light. ° °8. Call our office at [phone] or [phone] if you have sharp pain in your eye or unusual symptoms. ° °9.  FOLLOW UP WITH DR. SHAPIRO TODAY IN HIS [place] OFFICE AT 2:45pm. ° °  °I have received a copy of the above instructions and will follow them.  ° ° ° °IF YOU ARE IN IMMEDIATE DANGER CALL 911! ° °It is important for you to keep your follow-up appointment with your physician after discharge, OR, for you /your caregiver to make a follow-up appointment with your physician / medical provider after discharge. ° °Show these instructions to the next healthcare provider you see. ° °

## 2017-09-15 NOTE — Op Note (Signed)
Patient brought to the operating room and prepped and draped in the usual manner.  Lid speculum inserted in right eye.  Stab incision made at the twelve o'clock position.  Intraocular Xylocaine instilled.Provisc instilled in the anterior chamber.   A 2.4 mm. Stab incision was made temporally.  Due to a small pupil, a Malugyn Ring was inserted.  An anterior capsulotomy was done with a bent 25 gauge needle.  The nucleus was hydrodissected.  The Phaco tip was inserted in the anterior chamber and the nucleus was emulsified.  CDE was 12.22.  The cortical material was then removed with the I and A tip.  Posterior capsule was the polished.  The anterior chamber was deepened with Provisc.  A 29.0 Diopter Alcon AU00T0 IOL was then inserted in the capsular bag. The Malugyn Ring was removed. Provisc was then removed with the I and A tip.  The wound was then hydrated.  Patient sent to the Recovery Room in good condition with follow up in my office.  Preoperative Diagnosis:  Nuclear Cataract OD Postoperative Diagnosis:  Same Procedure name: Kelman Phacoemulsification OD with IOL

## 2017-09-15 NOTE — Transfer of Care (Signed)
Immediate Anesthesia Transfer of Care Note  Patient: SHANICA CASTELLANOS  Procedure(s) Performed: CATARACT EXTRACTION PHACO AND INTRAOCULAR LENS PLACEMENT ([facility]) (Right Eye)  Patient Location: Short Stay  Anesthesia Type:MAC  Level of Consciousness: awake and alert   Airway & Oxygen Therapy: Patient Spontanous Breathing  Post-op Assessment: Report given to RN and Post -op Vital signs reviewed and stable  Post vital signs: Reviewed and stable  Last Vitals:  Vitals:   09/15/17 0805 09/15/17 0810  BP: (!) 100/53 (!) 90/52  Pulse:    Resp: (!) 21 17  Temp:    SpO2: 95% 91%    Last Pain:  Vitals:   09/15/17 0738  TempSrc: Oral      Patients Stated Pain Goal: 7 (90/24/09 7353)  Complications: No apparent anesthesia complications

## 2017-09-15 NOTE — Anesthesia Preprocedure Evaluation (Signed)
Anesthesia Evaluation  Patient identified by MRN, date of birth, ID band Patient awake    Reviewed: Allergy & Precautions, NPO status , Patient's Chart, lab work & pertinent test results, reviewed documented beta blocker date and time   Airway Mallampati: II  TM Distance: >3 FB     Dental  (+) Edentulous Upper, Edentulous Lower   Pulmonary Current Smoker,    breath sounds clear to auscultation       Cardiovascular hypertension, Pt. on medications and Pt. on home beta blockers + CAD, + Past MI, + Cardiac Stents and +CHF  + dysrhythmias Ventricular Tachycardia + Cardiac Defibrillator  Rhythm:Regular Rate:Normal     Neuro/Psych CVA    GI/Hepatic negative GI ROS, Neg liver ROS,   Endo/Other  negative endocrine ROS  Renal/GU negative Renal ROS     Musculoskeletal   Abdominal   Peds  Hematology   Anesthesia Other Findings   Reproductive/Obstetrics                             Anesthesia Physical Anesthesia Plan  ASA: III  Anesthesia Plan: MAC   Post-op Pain Management:    Induction: Intravenous  PONV Risk Score and Plan:   Airway Management Planned: Nasal Cannula  Additional Equipment:   Intra-op Plan:   Post-operative Plan:   Informed Consent: I have reviewed the patients History and Physical, chart, labs and discussed the procedure including the risks, benefits and alternatives for the proposed anesthesia with the patient or authorized representative who has indicated his/her understanding and acceptance.     Plan Discussed with:   Anesthesia Plan Comments:         Anesthesia Quick Evaluation

## 2017-09-16 ENCOUNTER — Encounter (HOSPITAL_COMMUNITY): Payer: Self-pay | Admitting: Ophthalmology

## 2017-09-22 ENCOUNTER — Encounter (HOSPITAL_COMMUNITY)
Admission: RE | Admit: 2017-09-22 | Discharge: 2017-09-22 | Disposition: A | Discharge status: Home / Self Care | Payer: Medicare Other | Source: Ambulatory Visit | Attending: Ophthalmology | Admitting: Ophthalmology

## 2017-09-22 ENCOUNTER — Encounter (HOSPITAL_COMMUNITY): Payer: Self-pay

## 2017-09-29 ENCOUNTER — Encounter (HOSPITAL_COMMUNITY): Payer: Self-pay | Admitting: *Deleted

## 2017-09-29 ENCOUNTER — Encounter (HOSPITAL_COMMUNITY)
Admission: RE | Disposition: A | Discharge status: Home / Self Care | Payer: Self-pay | Source: Ambulatory Visit | Attending: Ophthalmology

## 2017-09-29 ENCOUNTER — Ambulatory Visit (HOSPITAL_COMMUNITY): Payer: Medicare Other | Admitting: Anesthesiology

## 2017-09-29 ENCOUNTER — Ambulatory Visit (HOSPITAL_COMMUNITY)
Admission: RE | Admit: 2017-09-29 | Discharge: 2017-09-29 | Disposition: A | Discharge status: Home / Self Care | Payer: Medicare Other | Source: Ambulatory Visit | Attending: Ophthalmology | Admitting: Ophthalmology

## 2017-09-29 DIAGNOSIS — I252 Old myocardial infarction: Secondary | ICD-10-CM | POA: Diagnosis not present

## 2017-09-29 DIAGNOSIS — I509 Heart failure, unspecified: Secondary | ICD-10-CM | POA: Diagnosis not present

## 2017-09-29 DIAGNOSIS — H2512 Age-related nuclear cataract, left eye: Secondary | ICD-10-CM | POA: Diagnosis not present

## 2017-09-29 DIAGNOSIS — F17200 Nicotine dependence, unspecified, uncomplicated: Secondary | ICD-10-CM | POA: Insufficient documentation

## 2017-09-29 DIAGNOSIS — Z955 Presence of coronary angioplasty implant and graft: Secondary | ICD-10-CM | POA: Diagnosis not present

## 2017-09-29 DIAGNOSIS — Z79899 Other long term (current) drug therapy: Secondary | ICD-10-CM | POA: Diagnosis not present

## 2017-09-29 DIAGNOSIS — I2510 Atherosclerotic heart disease of native coronary artery without angina pectoris: Secondary | ICD-10-CM | POA: Insufficient documentation

## 2017-09-29 DIAGNOSIS — I110 Hypertensive heart disease with heart failure: Secondary | ICD-10-CM | POA: Insufficient documentation

## 2017-09-29 HISTORY — PX: CATARACT EXTRACTION W/PHACO: SHX586

## 2017-09-29 SURGERY — PHACOEMULSIFICATION, CATARACT, WITH IOL INSERTION
Anesthesia: Monitor Anesthesia Care | Site: Eye | Laterality: Left

## 2017-09-29 MED ORDER — FENTANYL CITRATE (PF) 100 MCG/2ML IJ SOLN
25.0000 ug | Freq: Once | INTRAMUSCULAR | Status: AC
  Administered 2017-09-29: 25 ug via INTRAVENOUS
  Filled 2017-09-29: qty 2

## 2017-09-29 MED ORDER — CYCLOPENTOLATE-PHENYLEPHRINE 0.2-1 % OP SOLN
1.0000 [drp] | OPHTHALMIC | Status: AC
  Administered 2017-09-29 (×3): 1 [drp] via OPHTHALMIC

## 2017-09-29 MED ORDER — TETRACAINE HCL 0.5 % OP SOLN
1.0000 [drp] | OPHTHALMIC | Status: AC
  Administered 2017-09-29 (×3): 1 [drp] via OPHTHALMIC

## 2017-09-29 MED ORDER — PHENYLEPHRINE HCL 2.5 % OP SOLN
1.0000 [drp] | OPHTHALMIC | Status: AC
Start: 2017-09-29 — End: 2017-09-29
  Administered 2017-09-29 (×3): 1 [drp] via OPHTHALMIC

## 2017-09-29 MED ORDER — TETRACAINE 0.5 % OP SOLN OPTIME - NO CHARGE
OPHTHALMIC | Status: DC | PRN
  Administered 2017-09-29: 2 [drp] via OPHTHALMIC

## 2017-09-29 MED ORDER — KETOROLAC TROMETHAMINE 0.5 % OP SOLN
1.0000 [drp] | OPHTHALMIC | Status: AC
  Administered 2017-09-29 (×3): 1 [drp] via OPHTHALMIC

## 2017-09-29 MED ORDER — MIDAZOLAM HCL 2 MG/2ML IJ SOLN
1.0000 mg | INTRAMUSCULAR | Status: AC
  Administered 2017-09-29: 2 mg via INTRAVENOUS
  Filled 2017-09-29: qty 2

## 2017-09-29 MED ORDER — LIDOCAINE HCL (PF) 1 % IJ SOLN
INTRAMUSCULAR | Status: DC | PRN
  Administered 2017-09-29: .9 mL

## 2017-09-29 MED ORDER — LACTATED RINGERS IV SOLN
INTRAVENOUS | Status: DC
  Administered 2017-09-29: 08:00:00 via INTRAVENOUS

## 2017-09-29 MED ORDER — LIDOCAINE HCL (PF) 1 % IJ SOLN
INTRAMUSCULAR | Status: AC
  Filled 2017-09-29: qty 2

## 2017-09-29 MED ORDER — PROVISC 10 MG/ML IO SOLN
INTRAOCULAR | Status: DC | PRN
  Administered 2017-09-29: 0.85 mL via INTRAOCULAR

## 2017-09-29 MED ORDER — BSS IO SOLN
INTRAOCULAR | Status: DC | PRN
  Administered 2017-09-29: 15 mL

## 2017-09-29 MED ORDER — EPINEPHRINE PF 1 MG/ML IJ SOLN
INTRAOCULAR | Status: DC | PRN
  Administered 2017-09-29: 500 mL

## 2017-09-29 SURGICAL SUPPLY — 10 items
CLOTH BEACON ORANGE TIMEOUT ST (SAFETY) ×2 IMPLANT
EYE SHIELD UNIVERSAL CLEAR (GAUZE/BANDAGES/DRESSINGS) ×2 IMPLANT
GLOVE BIOGEL PI IND STRL 6.5 (GLOVE) ×2 IMPLANT
GLOVE BIOGEL PI INDICATOR 6.5 (GLOVE) ×2
LENS ALC ACRYL/TECN (Ophthalmic Related) ×2 IMPLANT
PAD ARMBOARD 7.5X6 YLW CONV (MISCELLANEOUS) ×2 IMPLANT
RING MALYGIN (MISCELLANEOUS) ×2 IMPLANT
TAPE SURG TRANSPORE 1 IN (GAUZE/BANDAGES/DRESSINGS) ×1 IMPLANT
TAPE SURGICAL TRANSPORE 1 IN (GAUZE/BANDAGES/DRESSINGS) ×1
WATER STERILE IRR 250ML POUR (IV SOLUTION) ×2 IMPLANT

## 2017-09-29 NOTE — Discharge Instructions (Signed)
°  °        [facility] Instructions °[address]- [place] [address]-[place] °    ° °1. Avoid closing eyes tightly. One often closes the eye tightly when laughing, talking, sneezing, coughing or if they feel irritated. At these times, you should be careful not to close your eyes tightly. ° °2. Instill eye drops as instructed. To instill drops in your eye, open it, look up and have someone gently pull the lower lid down and instill a couple of drops inside the lower lid. ° °3. Do not touch upper lid. ° °4. Take Advil or Tylenol for pain. ° °5. You may use either eye for near work, such as reading or sewing and you may watch television. ° °6. You may have your hair done at the beauty parlor at any time. ° °7. Wear dark glasses with or without your own glasses if you are in bright light. ° °8. Call our office at [phone] or [phone] if you have sharp pain in your eye or unusual symptoms. ° °9.  FOLLOW UP WITH DR. SHAPIRO TODAY IN HIS [place] OFFICE AT 3:15 pm. ° °  °I have received a copy of the above instructions and will follow them.  ° ° ° °IF YOU ARE IN IMMEDIATE DANGER CALL 911! ° °It is important for you to keep your follow-up appointment with your physician after discharge, OR, for you /your caregiver to make a follow-up appointment with your physician / medical provider after discharge. ° °Show these instructions to the next healthcare provider you see. ° ° °Monitored Anesthesia Care, Care After °These instructions provide you with information about caring for yourself after your procedure. Your health care provider may also give you more specific instructions. Your treatment has been planned according to current medical practices, but problems sometimes occur. Call your health care provider if you have any problems or questions after your procedure. °What can I expect after the procedure? °After your procedure, it is common to: °· Feel sleepy for several  hours. °· Feel clumsy and have poor balance for several hours. °· Feel forgetful about what happened after the procedure. °· Have poor judgment for several hours. °· Feel nauseous or vomit. °· Have a sore throat if you had a breathing tube during the procedure. ° °Follow these instructions at home: °For at least 24 hours after the procedure: ° °· Do not: °? Participate in activities in which you could fall or become injured. °? Drive. °? Use heavy machinery. °? Drink alcohol. °? Take sleeping pills or medicines that cause drowsiness. °? Make important decisions or sign legal documents. °? Take care of children on your own. °· Rest. °Eating and drinking °· Follow the diet that is recommended by your health care provider. °· If you vomit, drink water, juice, or soup when you can drink without vomiting. °· Make sure you have little or no nausea before eating solid foods. °General instructions °· Have a responsible adult stay with you until you are awake and alert. °· Take over-the-counter and prescription medicines only as told by your health care provider. °· If you smoke, do not smoke without supervision. °· Keep all follow-up visits as told by your health care provider. This is important. °Contact a health care provider if: °· You keep feeling nauseous or you keep vomiting. °· You feel light-headed. °· You develop a rash. °· You have a fever. °Get help right away if: °· You have trouble   breathing. °This information is not intended to replace advice given to you by your health care provider. Make sure you discuss any questions you have with your health care provider. °Document Released: 02/10/2016 Document Revised: 06/11/2016 Document Reviewed: 02/10/2016 °Elsevier Interactive Patient Education © 2018 [employer] ° ° °

## 2017-09-29 NOTE — Anesthesia Preprocedure Evaluation (Signed)
Anesthesia Evaluation  Patient identified by MRN, date of birth, ID band Patient awake    Reviewed: Allergy & Precautions, NPO status , Patient's Chart, lab work & pertinent test results, reviewed documented beta blocker date and time   Airway Mallampati: II  TM Distance: >3 FB     Dental  (+) Edentulous Upper, Edentulous Lower   Pulmonary Current Smoker,    breath sounds clear to auscultation       Cardiovascular hypertension, Pt. on medications and Pt. on home beta blockers + CAD, + Past MI, + Cardiac Stents and +CHF  + dysrhythmias Ventricular Tachycardia + Cardiac Defibrillator  Rhythm:Regular Rate:Normal     Neuro/Psych CVA    GI/Hepatic negative GI ROS, Neg liver ROS,   Endo/Other  negative endocrine ROS  Renal/GU negative Renal ROS     Musculoskeletal   Abdominal   Peds  Hematology   Anesthesia Other Findings   Reproductive/Obstetrics                             Anesthesia Physical Anesthesia Plan  ASA: III  Anesthesia Plan: MAC   Post-op Pain Management:    Induction: Intravenous  PONV Risk Score and Plan:   Airway Management Planned: Nasal Cannula  Additional Equipment:   Intra-op Plan:   Post-operative Plan:   Informed Consent: I have reviewed the patients History and Physical, chart, labs and discussed the procedure including the risks, benefits and alternatives for the proposed anesthesia with the patient or authorized representative who has indicated his/her understanding and acceptance.     Plan Discussed with:   Anesthesia Plan Comments:         Anesthesia Quick Evaluation

## 2017-09-29 NOTE — Anesthesia Postprocedure Evaluation (Signed)
Anesthesia Post Note  Patient: Nicole Clements  Procedure(s) Performed: CATARACT EXTRACTION PHACO AND INTRAOCULAR LENS PLACEMENT ([place]) (Left Eye)  Patient location during evaluation: Short Stay Anesthesia Type: MAC Level of consciousness: awake and alert, oriented and patient cooperative Pain management: pain level controlled Vital Signs Assessment: post-procedure vital signs reviewed and stable Respiratory status: spontaneous breathing and respiratory function stable Cardiovascular status: stable Postop Assessment: no apparent nausea or vomiting Anesthetic complications: no     Last Vitals:  Vitals:   09/29/17 0740 09/29/17 0745  BP: (!) 116/55 (!) 106/52  Resp: 17 (!) 21  Temp:    SpO2: 97% 95%    Last Pain:  Vitals:   09/29/17 0710  TempSrc: Oral                 Karnell Vanderloop A

## 2017-09-29 NOTE — Transfer of Care (Signed)
Immediate Anesthesia Transfer of Care Note  Patient: Nicole Clements  Procedure(s) Performed: CATARACT EXTRACTION PHACO AND INTRAOCULAR LENS PLACEMENT ([place]) (Left Eye)  Patient Location: Short Stay  Anesthesia Type:MAC  Level of Consciousness: awake, alert , oriented and patient cooperative  Airway & Oxygen Therapy: Patient Spontanous Breathing  Post-op Assessment: Report given to RN and Post -op Vital signs reviewed and stable  Post vital signs: Reviewed and stable  Last Vitals:  Vitals:   09/29/17 0740 09/29/17 0745  BP: (!) 116/55 (!) 106/52  Resp: 17 (!) 21  Temp:    SpO2: 97% 95%    Last Pain:  Vitals:   09/29/17 0710  TempSrc: Oral         Complications: No apparent anesthesia complications

## 2017-09-29 NOTE — Op Note (Signed)
Patient brought to the operating room and prepped and draped in the usual manner.  Lid speculum inserted in left eye.  Stab incision made at the twelve o'clock position.  Intraocular Xylocaine instilled. Provisc instilled in the anterior chamber.   A 2.4 mm. Stab incision was made temporally. Due to a small pupil, a Malugyn Ring was inserted.  An anterior capsulotomy was done with a bent 25 gauge needle.  The nucleus was hydrodissected.  The Phaco tip was inserted in the anterior chamber and the nucleus was emulsified.  CDE was 10.02.  The cortical material was then removed with the I and A tip.  Posterior capsule was the polished.  The anterior chamber was deepened with Provisc.  A 29.0 Diopter Alcon AU00T0 IOL was then inserted in the capsular bag.  The Malugyn Ring was removed. Provisc was then removed with the I and A tip.  The wound was then hydrated.  Patient sent to the Recovery Room in good condition with follow up in my office.  Preoperative Diagnosis:  Nuclear Cataract OS Postoperative Diagnosis:  Same Procedure name: Kelman Phacoemulsification OS with IOL

## 2017-09-29 NOTE — H&P (Signed)
The patient was re examined and there is no change in the patients condition since the original H and P. 

## 2017-09-30 ENCOUNTER — Encounter (HOSPITAL_COMMUNITY): Payer: Self-pay | Admitting: Ophthalmology

## 2017-10-01 ENCOUNTER — Ambulatory Visit (INDEPENDENT_AMBULATORY_CARE_PROVIDER_SITE_OTHER): Payer: Medicare Other | Admitting: *Deleted

## 2017-10-01 DIAGNOSIS — I5022 Chronic systolic (congestive) heart failure: Secondary | ICD-10-CM

## 2017-10-01 DIAGNOSIS — Z95810 Presence of automatic (implantable) cardiac defibrillator: Secondary | ICD-10-CM

## 2017-10-01 DIAGNOSIS — I255 Ischemic cardiomyopathy: Secondary | ICD-10-CM | POA: Diagnosis not present

## 2017-10-01 NOTE — Progress Notes (Signed)
Remote ICD transmission.   

## 2017-10-02 ENCOUNTER — Encounter: Payer: Self-pay | Admitting: Cardiology

## 2017-10-05 LAB — CUP PACEART REMOTE DEVICE CHECK
Battery Remaining Longevity: 68 mo
Brady Statistic AP VS Percent: 1.46 %
Brady Statistic AS VS Percent: 0.06 %
Brady Statistic RV Percent Paced: 42.6 %
HighPow Impedance: 70 Ohm
Implantable Lead Implant Date: 20160615
Implantable Lead Location: 753859
Implantable Lead Location: 753860
Implantable Pulse Generator Implant Date: 20160615
Lead Channel Impedance Value: 399 Ohm
Lead Channel Impedance Value: 475 Ohm
Lead Channel Impedance Value: 589 Ohm
Lead Channel Impedance Value: 646 Ohm
Lead Channel Impedance Value: 893 Ohm
Lead Channel Impedance Value: 893 Ohm
Lead Channel Impedance Value: 893 Ohm
Lead Channel Pacing Threshold Amplitude: 0.875 V
Lead Channel Pacing Threshold Pulse Width: 0.4 ms
Lead Channel Sensing Intrinsic Amplitude: 15.625 mV
Lead Channel Sensing Intrinsic Amplitude: 15.625 mV
Lead Channel Sensing Intrinsic Amplitude: 2.5 mV
Lead Channel Sensing Intrinsic Amplitude: 2.5 mV
Lead Channel Setting Pacing Amplitude: 1.5 V
Lead Channel Setting Pacing Pulse Width: 0.4 ms
Lead Channel Setting Pacing Pulse Width: 0.4 ms
MDC IDC LEAD IMPLANT DT: 20160615
MDC IDC LEAD IMPLANT DT: 20160615
MDC IDC LEAD LOCATION: 753858
MDC IDC MSMT BATTERY VOLTAGE: 2.97 V
MDC IDC MSMT LEADCHNL LV IMPEDANCE VALUE: 456 Ohm
MDC IDC MSMT LEADCHNL LV IMPEDANCE VALUE: 475 Ohm
MDC IDC MSMT LEADCHNL LV IMPEDANCE VALUE: 665 Ohm
MDC IDC MSMT LEADCHNL LV IMPEDANCE VALUE: 703 Ohm
MDC IDC MSMT LEADCHNL LV PACING THRESHOLD PULSEWIDTH: 0.4 ms
MDC IDC MSMT LEADCHNL RA IMPEDANCE VALUE: 456 Ohm
MDC IDC MSMT LEADCHNL RA PACING THRESHOLD AMPLITUDE: 0.625 V
MDC IDC MSMT LEADCHNL RV IMPEDANCE VALUE: 551 Ohm
MDC IDC MSMT LEADCHNL RV PACING THRESHOLD AMPLITUDE: 0.5 V
MDC IDC MSMT LEADCHNL RV PACING THRESHOLD PULSEWIDTH: 0.4 ms
MDC IDC SESS DTM: 20181129082205
MDC IDC SET LEADCHNL LV PACING AMPLITUDE: 2 V
MDC IDC SET LEADCHNL RV PACING AMPLITUDE: 2 V
MDC IDC SET LEADCHNL RV SENSING SENSITIVITY: 0.45 mV
MDC IDC STAT BRADY AP VP PERCENT: 96.13 %
MDC IDC STAT BRADY AS VP PERCENT: 2.35 %
MDC IDC STAT BRADY RA PERCENT PACED: 96.43 %

## 2017-10-05 NOTE — Progress Notes (Signed)
EPIC Encounter for ICM Monitoring  Patient Name: Nicole Clements is a 76 y.o. female Date: 10/05/2017 Primary Care Physican: Mayra Neer, MD Primary Cardiologist:Branch Electrophysiologist: Caryl Comes Dry Weight: 150lbs Bi-V Pacing: 95.9%        Heart Failure questions reviewed, pt asymptomatic    Thoracic impedance normal.  Prescribed dosage: Furosemide 80 mg 1 tablet daily and Potassium 20 mEq 1 tablet daily  Lab 02/09/2017 Creatinine 1.22, BUN 11, Potassium 4.2, Sodium 138 07/22/2016 Creatinine 1.30, BUN 12, Potassium 4.6, Sodium 138, EGFR 40-49  Recommendations: No changes.   Encouraged to call for fluid symptoms.  Follow-up plan: ICM clinic phone appointment on 11/05/2017.    Copy of ICM check sent to Dr. Caryl Comes.   3 month ICM trend: 10/01/2017    1 Year ICM trend:       Rosalene Billings, RN 10/05/2017 3:07 PM

## 2017-11-05 ENCOUNTER — Ambulatory Visit (INDEPENDENT_AMBULATORY_CARE_PROVIDER_SITE_OTHER): Payer: Medicare Other

## 2017-11-05 DIAGNOSIS — Z95810 Presence of automatic (implantable) cardiac defibrillator: Secondary | ICD-10-CM | POA: Diagnosis not present

## 2017-11-05 DIAGNOSIS — I5022 Chronic systolic (congestive) heart failure: Secondary | ICD-10-CM

## 2017-11-06 NOTE — Progress Notes (Signed)
EPIC Encounter for ICM Monitoring  Patient Name: Nicole Clements is a 77 y.o. female Date: 11/06/2017 Primary Care Physican: Mayra Neer, MD Primary Cardiologist:Branch Electrophysiologist: Caryl Comes Dry Weight: 150lbs Bi-V Pacing: 95.6%      Heart Failure questions reviewed, pt asymptomatic.   Thoracic impedance normal.  Prescribed dosage: Furosemide 80 mg 1 tablet daily and Potassium 20 mEq 1 tablet daily  Lab 02/09/2017 Creatinine 1.22, BUN 11, Potassium 4.2, Sodium 138 07/22/2016 Creatinine 1.30, BUN 12, Potassium 4.6, Sodium 138, EGFR 40-49  Recommendations: No changes.   Encouraged to call for fluid symptoms.  Follow-up plan: ICM clinic phone appointment on 12/07/2017.    Copy of ICM check sent to Dr. Caryl Comes.   3 month ICM trend: 11/05/2017    1 Year ICM trend:       Rosalene Billings, RN 11/06/2017 5:01 PM

## 2017-11-12 DIAGNOSIS — E782 Mixed hyperlipidemia: Secondary | ICD-10-CM | POA: Diagnosis not present

## 2017-11-13 ENCOUNTER — Other Ambulatory Visit: Payer: Self-pay

## 2017-11-13 MED ORDER — POTASSIUM CHLORIDE CRYS ER 20 MEQ PO TBCR: 20.0000 meq | EXTENDED_RELEASE_TABLET | Freq: Every day | ORAL | 2 refills | Status: DC

## 2017-11-13 NOTE — Telephone Encounter (Signed)
Refilled potassium per fax request to [facility]

## 2017-12-07 ENCOUNTER — Ambulatory Visit (INDEPENDENT_AMBULATORY_CARE_PROVIDER_SITE_OTHER): Payer: Medicare Other

## 2017-12-07 DIAGNOSIS — I5022 Chronic systolic (congestive) heart failure: Secondary | ICD-10-CM

## 2017-12-07 DIAGNOSIS — Z95810 Presence of automatic (implantable) cardiac defibrillator: Secondary | ICD-10-CM

## 2017-12-07 NOTE — Progress Notes (Signed)
EPIC Encounter for ICM Monitoring  Patient Name: Nicole Clements is a 77 y.o. female Date: 12/07/2017 Primary Care Physican: Mayra Neer, MD Primary Cardiologist:Branch Electrophysiologist: Caryl Comes Dry Weight:150lbs Bi-V Pacing: 81.3% (95.6% on 11-05-17)         Heart Failure questions reviewed, pt asymptomatic.  She denied any fluid symptoms.  Also denied any changes in her heart rate.     Thoracic impedance abnormal suggesting fluid accumulation since 11/07/2017 but is trending back to baseline.  Remote Transmission reviewed by device clinic nurse, Memory Dance and she reported patient is having PVC's..   Prescribed dosage: Furosemide 80 mg 1 tablet daily and Potassium 20 mEq 1 tablet daily  Lab 09/11/2017 Creatinine 1.19, BUN 10, Potassium 4.6, Sodium 136, EGFR 44-51 08/10/2017 Creatinine 1.33, BUN 9,   Potassium 5.5, Sodium 139, EGFR 39-47 02/09/2017 Creatinine 1.22, BUN 11, Potassium 4.2, Sodium 138 07/22/2016 Creatinine 1.30, BUN 12, Potassium 4.6, Sodium 138, EGFR 40-49  Recommendations: Advised to limit salt intake.  Advised would review remote transmission with Dr Caryl Comes in the office tomorrow.  Advised if she become symptomatic to call the office.   Follow-up plan: ICM clinic phone appointment on 01/07/2018.    Copy of ICM check sent to Dr. Caryl Comes.   3 month ICM trend: 12/07/2017    1 Year ICM trend:       Rosalene Billings, RN 12/07/2017 1:10 PM

## 2017-12-08 NOTE — Progress Notes (Signed)
Reviewed transmission regarding PVC's with Dr Graciela Husbands.  He recommended to continue to monitor and no changes today.

## 2018-01-07 ENCOUNTER — Ambulatory Visit (INDEPENDENT_AMBULATORY_CARE_PROVIDER_SITE_OTHER): Payer: Medicare Other | Admitting: *Deleted

## 2018-01-07 DIAGNOSIS — Z95810 Presence of automatic (implantable) cardiac defibrillator: Secondary | ICD-10-CM

## 2018-01-07 DIAGNOSIS — I255 Ischemic cardiomyopathy: Secondary | ICD-10-CM | POA: Diagnosis not present

## 2018-01-07 DIAGNOSIS — I5022 Chronic systolic (congestive) heart failure: Secondary | ICD-10-CM

## 2018-01-07 LAB — CUP PACEART REMOTE DEVICE CHECK
Battery Remaining Longevity: 62 mo
Brady Statistic AP VP Percent: 97.66 %
Brady Statistic AP VS Percent: 1.7 %
Brady Statistic AS VS Percent: 0.15 %
Brady Statistic RA Percent Paced: 74.5 %
Brady Statistic RV Percent Paced: 12.32 %
HighPow Impedance: 65 Ohm
Implantable Lead Implant Date: 20160615
Implantable Lead Implant Date: 20160615
Implantable Lead Implant Date: 20160615
Implantable Lead Location: 753858
Implantable Lead Location: 753860
Implantable Lead Model: 5076
Lead Channel Impedance Value: 361 Ohm
Lead Channel Impedance Value: 399 Ohm
Lead Channel Impedance Value: 399 Ohm
Lead Channel Impedance Value: 399 Ohm
Lead Channel Impedance Value: 399 Ohm
Lead Channel Impedance Value: 456 Ohm
Lead Channel Impedance Value: 513 Ohm
Lead Channel Impedance Value: 665 Ohm
Lead Channel Impedance Value: 703 Ohm
Lead Channel Pacing Threshold Amplitude: 0.625 V
Lead Channel Pacing Threshold Pulse Width: 0.4 ms
Lead Channel Sensing Intrinsic Amplitude: 14.75 mV
Lead Channel Sensing Intrinsic Amplitude: 14.75 mV
Lead Channel Sensing Intrinsic Amplitude: 3.375 mV
Lead Channel Sensing Intrinsic Amplitude: 3.375 mV
Lead Channel Setting Pacing Amplitude: 1.5 V
Lead Channel Setting Pacing Amplitude: 2 V
Lead Channel Setting Pacing Pulse Width: 0.4 ms
MDC IDC LEAD LOCATION: 753859
MDC IDC MSMT BATTERY VOLTAGE: 2.98 V
MDC IDC MSMT LEADCHNL LV IMPEDANCE VALUE: 779 Ohm
MDC IDC MSMT LEADCHNL LV IMPEDANCE VALUE: 817 Ohm
MDC IDC MSMT LEADCHNL LV IMPEDANCE VALUE: 817 Ohm
MDC IDC MSMT LEADCHNL LV PACING THRESHOLD AMPLITUDE: 0.875 V
MDC IDC MSMT LEADCHNL LV PACING THRESHOLD PULSEWIDTH: 0.4 ms
MDC IDC MSMT LEADCHNL RA PACING THRESHOLD AMPLITUDE: 0.625 V
MDC IDC MSMT LEADCHNL RA PACING THRESHOLD PULSEWIDTH: 0.4 ms
MDC IDC MSMT LEADCHNL RV IMPEDANCE VALUE: 532 Ohm
MDC IDC PG IMPLANT DT: 20160615
MDC IDC SESS DTM: 20190307083623
MDC IDC SET LEADCHNL RV PACING AMPLITUDE: 2 V
MDC IDC SET LEADCHNL RV PACING PULSEWIDTH: 0.4 ms
MDC IDC SET LEADCHNL RV SENSING SENSITIVITY: 0.45 mV
MDC IDC STAT BRADY AS VP PERCENT: 0.49 %

## 2018-01-07 NOTE — Progress Notes (Signed)
Remote ICD transmission.   

## 2018-01-08 ENCOUNTER — Encounter: Payer: Self-pay | Admitting: Cardiology

## 2018-01-08 NOTE — Progress Notes (Signed)
EPIC Encounter for ICM Monitoring  Patient Name: Nicole Clements is a 77 y.o. female Date: 01/08/2018 Primary Care Physican: Mayra Neer, MD Primary Cardiologist:Branch Electrophysiologist: Caryl Comes Dry Weight: 152lbs Bi-V Pacing: 75.8% (95.6% on 11-05-17)           Heart Failure questions reviewed, pt asymptomatic.  Patient reported she eats out all the time and not following low salt diet.      Thoracic impedance abnormal suggesting fluid accumulation.  Prescribed dosage: Furosemide 80 mg 1 tablet daily and Potassium 20 mEq 1 tablet daily.  Not taking as prescribed.  She is taking Furosemide 40 mg daily for the past year because PCP decreased the dosage.     Lab 09/11/2017 Creatinine 1.19, BUN 10, Potassium 4.6, Sodium 136, EGFR 44-51 08/10/2017 Creatinine 1.33, BUN 9,   Potassium 5.5, Sodium 139, EGFR 39-47 02/09/2017 Creatinine 1.22, BUN 11, Potassium 4.2, Sodium 138 07/22/2016 Creatinine 1.30, BUN 12, Potassium 4.6, Sodium 138, EGFR 40-49  Recommendations:  Advised to take prescribed dosage of 80 mg as prescribed x 3 days and explained restaurant foods are very high in salt.  Follow-up plan: ICM clinic phone appointment on 01/14/2018 to recheck fluid levels.   Copy of ICM check sent to Dr. Caryl Comes and Dr. Harl Bowie.   3 month ICM trend: 01/07/2018    1 Year ICM trend:       Rosalene Billings, RN 01/08/2018 2:12 PM

## 2018-01-14 ENCOUNTER — Ambulatory Visit (INDEPENDENT_AMBULATORY_CARE_PROVIDER_SITE_OTHER): Payer: Self-pay

## 2018-01-14 DIAGNOSIS — I5022 Chronic systolic (congestive) heart failure: Secondary | ICD-10-CM

## 2018-01-14 DIAGNOSIS — Z95810 Presence of automatic (implantable) cardiac defibrillator: Secondary | ICD-10-CM

## 2018-01-14 NOTE — Progress Notes (Signed)
EPIC Encounter for ICM Monitoring  Patient Name: Nicole Clements is a 76 y.o. female Date: 01/14/2018 Primary Care Physican: Shaw, Kimberlee, MD Primary Cardiologist:Branch Electrophysiologist: Klein Dry Weight:150lbs Bi-V Pacing: 80% (95.6% on 11-05-17)       Heart Failure questions reviewed, pt asymptomatic.  Patient eats at restaurants most of the time.   Thoracic impedance returned to normal since taking Furosemide 80 mg daily x 3 days.   Prescribed dosage: Furosemide 80 mg 1 tablet daily and Potassium 20 mEq 1 tablet daily.  PCP ordered patient to take Furosemide 40mg 1 tablet daily a year ago which she has been taking.    Lab 09/11/2017 Creatinine 1.19, BUN 10, Potassium 4.6, Sodium 136, EGFR 44-51 08/10/2017 Creatinine 1.33, BUN 9,   Potassium 5.5, Sodium 139, EGFR 39-47 02/09/2017 Creatinine 1.22, BUN 11, Potassium 4.2, Sodium 138 07/22/2016 Creatinine 1.30, BUN 12, Potassium 4.6, Sodium 138, EGFR 40-49  Recommendations: Advised to follow PCP instructions and return to Fursosemide 40 mg daily and will recheck fluid levels in 2 weeks.  Encouraged to call for fluid symptoms.  Follow-up plan: ICM clinic phone appointment on 01/28/2018.  Office appointment scheduled 02/25/2018 with Dr. Branch.  Copy of ICM check sent to Dr. Branch and Dr. Klein.   3 month ICM trend: 01/14/2018    1 Year ICM trend:       Laurie S Short, RN 01/14/2018 5:00 PM   

## 2018-01-28 ENCOUNTER — Other Ambulatory Visit: Payer: Self-pay | Admitting: Cardiology

## 2018-01-28 ENCOUNTER — Ambulatory Visit (INDEPENDENT_AMBULATORY_CARE_PROVIDER_SITE_OTHER): Payer: Self-pay

## 2018-01-28 DIAGNOSIS — I5022 Chronic systolic (congestive) heart failure: Secondary | ICD-10-CM

## 2018-01-28 DIAGNOSIS — Z95810 Presence of automatic (implantable) cardiac defibrillator: Secondary | ICD-10-CM

## 2018-01-28 MED ORDER — FUROSEMIDE 80 MG PO TABS: 80.0000 mg | ORAL_TABLET | Freq: Every day | ORAL | 3 refills | Status: DC

## 2018-01-28 NOTE — Telephone Encounter (Signed)
Refill escribed per request 

## 2018-01-28 NOTE — Progress Notes (Signed)
EPIC Encounter for ICM Monitoring  Patient Name: Nicole Clements is a 77 y.o. female Date: 01/28/2018 Primary Care Physican: Mayra Neer, MD Primary Cardiologist:Branch Electrophysiologist: Caryl Comes Dry Weight:150lbs Bi-V Pacing: 88.5% (95.6%on 11-05-17)      Heart Failure questions reviewed, pt asymptomatic.  Patient eats at restaurants most of the time.   Thoracic impedance returned to normal.  Prescribed dosage: Furosemide 80 mg 1 tablet daily and Potassium 20 mEq 1 tablet daily.  PCP ordered patient to take Furosemide 35m 1 tablet daily a year ago which she has been taking.    Lab 09/11/2017 Creatinine 1.19, BUN 10, Potassium 4.6, Sodium 136, EGFR 44-51 08/10/2017 Creatinine 1.33, BUN 9, Potassium 5.5, Sodium 139, EGFR 39-47 02/09/2017 Creatinine 1.22, BUN 11, Potassium 4.2, Sodium 138 07/22/2016 Creatinine 1.30, BUN 12, Potassium 4.6, Sodium 138, EGFR 40-49  Recommendations: No changes.   Encouraged to call for fluid symptoms.  Follow-up plan: ICM clinic phone appointment on 02/23/2018.  Office appointment scheduled 02/25/2018 with Dr. BHarl Bowie  Copy of ICM check sent to Dr. KCaryl Comes   3 month ICM trend: 01/28/2018    1 Year ICM trend:       LRosalene Billings RN 01/28/2018 10:34 AM

## 2018-01-28 NOTE — Telephone Encounter (Signed)
Needing a refill for her furosemide (LASIX) 80 MG tablet [[id number]]  Sent to [facility] [address]

## 2018-02-15 DIAGNOSIS — Z961 Presence of intraocular lens: Secondary | ICD-10-CM | POA: Diagnosis not present

## 2018-02-23 ENCOUNTER — Ambulatory Visit (INDEPENDENT_AMBULATORY_CARE_PROVIDER_SITE_OTHER): Payer: Medicare Other

## 2018-02-23 DIAGNOSIS — Z95810 Presence of automatic (implantable) cardiac defibrillator: Secondary | ICD-10-CM

## 2018-02-23 DIAGNOSIS — I5022 Chronic systolic (congestive) heart failure: Secondary | ICD-10-CM | POA: Diagnosis not present

## 2018-02-23 NOTE — Progress Notes (Signed)
EPIC Encounter for ICM Monitoring  Patient Name: Nicole Clements is a 77 y.o. female Date: 02/23/2018 Primary Care Physican: Mayra Neer, MD Primary Cardiologist:Branch Electrophysiologist: Caryl Comes Dry Weight:149lbs Bi-V Pacing: 92.2%       Heart Failure questions reviewed, pt asymptomatic.   Thoracic impedance normal.  Prescribed dosage: Furosemide 80 mg 1 tablet daily and Potassium 20 mEq 1 tablet daily. PCP ordered patient to take Furosemide '40mg'$  1 tablet daily a year ago which she has been taking.   Lab 09/11/2017 Creatinine 1.19, BUN 10, Potassium 4.6, Sodium 136, EGFR 44-51 08/10/2017 Creatinine 1.33, BUN 9, Potassium 5.5, Sodium 139, EGFR 39-47 02/09/2017 Creatinine 1.22, BUN 11, Potassium 4.2, Sodium 138 07/22/2016 Creatinine 1.30, BUN 12, Potassium 4.6, Sodium 138, EGFR 40-49  Recommendations: No changes.  Encouraged to call for fluid symptoms.  Patient plans to discuss the change in Furosemide dosage that was ordered by PCP in the last year which is half of what was prescribed by cardiology.   Follow-up plan: ICM clinic phone appointment on 03/26/2018.  Office appointment scheduled 02/25/2018 with Dr. Harl Bowie.  Copy of ICM check sent to Dr. Caryl Comes and Dr. Harl Bowie.   3 month ICM trend: 02/23/2018    1 Year ICM trend:       Rosalene Billings, RN 02/23/2018 11:05 AM

## 2018-02-25 ENCOUNTER — Encounter: Payer: Self-pay | Admitting: Cardiology

## 2018-02-25 ENCOUNTER — Ambulatory Visit (INDEPENDENT_AMBULATORY_CARE_PROVIDER_SITE_OTHER): Payer: Medicare Other | Admitting: Cardiology

## 2018-02-25 VITALS — BP 120/68 | HR 58 | Ht 62.0 in | Wt 152.0 lb

## 2018-02-25 DIAGNOSIS — Z79899 Other long term (current) drug therapy: Secondary | ICD-10-CM

## 2018-02-25 DIAGNOSIS — I255 Ischemic cardiomyopathy: Secondary | ICD-10-CM | POA: Diagnosis not present

## 2018-02-25 DIAGNOSIS — E782 Mixed hyperlipidemia: Secondary | ICD-10-CM | POA: Diagnosis not present

## 2018-02-25 DIAGNOSIS — I5022 Chronic systolic (congestive) heart failure: Secondary | ICD-10-CM

## 2018-02-25 DIAGNOSIS — I340 Nonrheumatic mitral (valve) insufficiency: Secondary | ICD-10-CM

## 2018-02-25 DIAGNOSIS — I2510 Atherosclerotic heart disease of native coronary artery without angina pectoris: Secondary | ICD-10-CM

## 2018-02-25 LAB — BASIC METABOLIC PANEL
BUN/Creatinine Ratio: 16 (calc) (ref 6–22)
BUN: 35 mg/dL — ABNORMAL HIGH (ref 7–25)
CO2: 23 mmol/L (ref 20–32)
CREATININE: 2.22 mg/dL — AB (ref 0.60–0.93)
Calcium: 9.6 mg/dL (ref 8.6–10.4)
Chloride: 89 mmol/L — ABNORMAL LOW (ref 98–110)
Glucose, Bld: 97 mg/dL (ref 65–139)
POTASSIUM: 5.2 mmol/L (ref 3.5–5.3)
Sodium: 121 mmol/L — ABNORMAL LOW (ref 135–146)

## 2018-02-25 LAB — MAGNESIUM: MAGNESIUM: 1.4 mg/dL — AB (ref 1.5–2.5)

## 2018-02-25 NOTE — Progress Notes (Signed)
Clinical Summary Nicole Clements is a 77 y.o.female seen today for follow up of the following medical problems.   1. 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 Admit 07/2014 with CHF and troponin elevation - cath 07/2014, LM 30% distal, LAD mid 50%, LCX ostial 50% and mid 50%, small intermediate Nicole Clements 70%. RCA diffuse 50%, distal RCA with ulcerated 90% lesion, PDA 80-90% too small for PCI. LVEF 30%. Received DES to distal RCA, post procedure the PDA became occluded.  - echo 07/2014 LVEF 20%, inferior akinesis, grade II diastolic dysfunction. Repeat echo 10/2014 LVEF remains 15-20%.  - 04/2015 CRT-D device placed by Dr Nicole Clements. Normal function Jan 2018.  10/2015 echo LVEF 25-30% - could not afford entresto, back on losartan.   - no chest pain. No sob/doe. No recent edema. Home weights stable around 149-150 - compliant with meds. Limiting sodium intake.   2. Mitral regurgitation - mild by most recent echo - no recent symptoms.   3. Hyperlipidemia - atorva was too expensive, changed to pravastatin by pcp  - Jan 2019 TC 133 TG 77 HDL 42 LDL 76  SH: frequently travels to [place] to visit her daughter.     Past Medical History:  Diagnosis Date  . AICD (automatic cardioverter/defibrillator) present   . CAD (coronary artery disease) April 2014   DES to PLA, occluded PDA 07/2014  . CHF (congestive heart failure) ([facility])   . Hypercholesterolemia   . Hypertension   . Ischemic cardiomyopathy    LVEF 25%-45%  . IVCD (intraventricular conduction defect)   . PVC's (premature ventricular contractions)   . S/P colonoscopy August 2007   Hyperplastic polyps, rare sigmoid and descending colon diverticulosis, small internal hemorrhoids  . STEMI (ST elevation myocardial infarction) ([facility]) 07/22/1996     Allergies  Allergen Reactions  . Lactose Intolerance (Gi) Other (See Comments)    GI Upset   . Sulfa Antibiotics Rash     Current Outpatient Medications  Medication Sig  Dispense Refill  . acetaminophen (TYLENOL) 325 MG tablet Take 2 tablets (650 mg total) by mouth every 4 (four) hours as needed for headache or mild pain. (Patient not taking: Reported on 09/03/2017)    . acetaminophen (TYLENOL) 500 MG tablet Take 1,000 mg by mouth every 8 (eight) hours as needed for mild pain or headache.    [name] aspirin EC 81 MG tablet Take 1 tablet (81 mg total) by mouth daily. 90 tablet 3  . carvedilol (COREG) 25 MG tablet take 1 tablet by mouth twice a day 60 tablet 11  . Cholecalciferol (VITAMIN D3) 2000 UNITS capsule Take 2,000 Units by mouth daily.      . furosemide (LASIX) 80 MG tablet Take 1 tablet (80 mg total) by mouth daily. 30 tablet 3  . losartan (COZAAR) 25 MG tablet Take 1 tablet (25 mg total) by mouth daily. 90 tablet 3  . nitroGLYCERIN (NITROSTAT) 0.4 MG SL tablet Place 1 tablet (0.4 mg total) under the tongue every 5 (five) minutes x 3 doses as needed for chest pain. 25 tablet 2  . Omega-3 Fatty Acids (FISH OIL) 1200 MG CAPS Take 1,200 mg by mouth daily.    . potassium chloride SA (K-DUR,KLOR-CON) 20 MEQ tablet Take 1 tablet (20 mEq total) by mouth daily. 90 tablet 2  . pravastatin (PRAVACHOL) 80 MG tablet Take 80 mg by mouth daily.    [name] spironolactone (ALDACTONE) 25 MG tablet Take 0.5 tablets (12.5 mg total) by mouth 2 (two)  times daily. 90 tablet 3   No current facility-administered medications for this visit.      Past Surgical History:  Procedure Laterality Date  . ABDOMINAL HYSTERECTOMY    . CARDIAC CATHETERIZATION  April 2014   Med Rx  . CATARACT EXTRACTION W/PHACO Right 09/15/2017   Procedure: CATARACT EXTRACTION PHACO AND INTRAOCULAR LENS PLACEMENT (IOC);  Surgeon: Nicole Guys, MD;  Location: [facility];  Service: Ophthalmology;  Laterality: Right;  CDE: 12.22  . CATARACT EXTRACTION W/PHACO Left 09/29/2017   Procedure: CATARACT EXTRACTION PHACO AND INTRAOCULAR LENS PLACEMENT (IOC);  Surgeon: Nicole Guys, MD;  Location: [facility];  Service: Ophthalmology;   Laterality: Left;  CDE: 10.02  . COLONOSCOPY N/A 12/24/2015   Dr. Fields:moderate sized internal hemorrhoids/moderate diverticulosis, negative microscopic colitis   . EP IMPLANTABLE DEVICE  04/18/2015   BV ICD  . EP IMPLANTABLE DEVICE N/A 04/18/2015   Procedure: BiV ICD Insertion CRT-D;  Surgeon: Nicole Sprang, MD;  Location: [place] [facility];  Service: Cardiovascular;  Laterality: N/A;  . LEFT AND RIGHT HEART CATHETERIZATION WITH CORONARY ANGIOGRAM N/A 07/17/2014   Procedure: LEFT AND RIGHT HEART CATHETERIZATION WITH CORONARY ANGIOGRAM;  Surgeon: Nicole Ohara, MD;  Location: [facility] CATH LAB;  Service: Cardiovascular;  Laterality: N/A;  . PERCUTANEOUS CORONARY STENT INTERVENTION (PCI-S)  07/17/2014   Procedure: PERCUTANEOUS CORONARY STENT INTERVENTION (PCI-S);  Surgeon: Nicole Ohara, MD;  Location: [facility] CATH LAB;  Service: Cardiovascular;;  Distal RCA  . S/P Hysterectomy       Allergies  Allergen Reactions  . Lactose Intolerance (Gi) Other (See Comments)    GI Upset   . Sulfa Antibiotics Rash      Family History  Problem Relation Age of Onset  . Hypertension Sister   . Diabetes Mellitus II Sister   . Hypertension Brother   . Diabetes Mellitus II Brother   . Hypertension Brother   . Diabetes Mellitus II Brother   . Hypertension Brother   . Diabetes Mellitus II Brother   . Colon cancer Neg Hx      Social History Nicole Clements reports that she has been smoking cigarettes.  She started smoking about 56 years ago. She has a 25.00 pack-year smoking history. She has never used smokeless tobacco. Nicole Clements reports that she does not drink alcohol.   Review of Systems CONSTITUTIONAL: No weight loss, fever, chills, weakness or fatigue.  HEENT: Eyes: No visual loss, blurred vision, double vision or yellow sclerae.No hearing loss, sneezing, congestion, runny nose or sore throat.  SKIN: No rash or itching.  CARDIOVASCULAR: per hpi RESPIRATORY: No shortness of breath, cough or  sputum.  GASTROINTESTINAL: No anorexia, nausea, vomiting or diarrhea. No abdominal pain or blood.  GENITOURINARY: No burning on urination, no polyuria NEUROLOGICAL: No headache, dizziness, syncope, paralysis, ataxia, numbness or tingling in the extremities. No change in bowel or bladder control.  MUSCULOSKELETAL: No muscle, back pain, joint pain or stiffness.  LYMPHATICS: No enlarged nodes. No history of splenectomy.  PSYCHIATRIC: No history of depression or anxiety.  ENDOCRINOLOGIC: No reports of sweating, cold or heat intolerance. No polyuria or polydipsia.  [name]   Physical Examination Vitals:   02/25/18 0953  BP: 120/68  Pulse: (!) 58  SpO2: 95%   Vitals:   02/25/18 0953  Weight: 152 lb (68.9 kg)  Height: _0  (1.575 m)    Gen: resting comfortably, no acute distress HEENT: no scleral icterus, pupils equal round and reactive, no palptable cervical adenopathy,  CV: RRR, no m/r/g,  no jvd Resp: Clear to auscultation bilaterally GI: abdomen is soft, non-tender, non-distended, normal bowel sounds, no hepatosplenomegaly MSK: extremities are warm, no edema.  Skin: warm, no rash Neuro:  no focal deficits Psych: appropriate affect   Diagnostic Studies 02/2013 Cath Procedural Findings:  Hemodynamics:  AO 116/45 with a mean of 70  LV 120/18  Coronary angiography:  Coronary dominance: right  Left mainstem: The left main is patent with 30% distal left main stenosis as the vessel trifurcates into the LAD, intermediate Crisol Muecke, and left circumflex.  Left anterior descending (LAD): The LAD is patent with diffuse disease noted. There is minimal calcification present. The first diagonal is patent with 30-50% diffuse disease. The LAD after the first septal perforator has 40-50% stenosis. The vessel reaches the apex and wraps around the left ventricular apex without significant stenosis.  Left circumflex (LCx): The intermediate Psalm Arman is small in caliber and diffusely diseased with  50-60% proximal vessel stenosis the AV groove circumflex is diffusely diseased with 50% ostial stenosis and 30% mid stenosis it supplies 2 small posterolateral Allen Basista  Right coronary artery (RCA): The RCA is diffusely diseased. There is significant tortuosity, especially around the junction of the mid and distal vessel. The vessel is diffusely calcified. The mid vessel has tandem 50% stenoses. The distal vessel has 80-90 % stenosis just before the bifurcation of the PDA and posterior AV segment. The PDA and posterior AV segment with 3 posterolateral branches are patent.  Left ventriculography: Left ventricular systolic function is moderately depressed. There is severe hypokinesis of the basal and midinferior walls. The anterolateral and apical walls contract normally. The left ventricular ejection fraction is estimated at 45%.  Abdominal aortogram: The abdominal aorta is patent. The renal arteries are single bilaterally and they are widely patent. There appears to be nonobstructive stenosis of the right common iliac artery at the aortoiliac junction. The left common iliac artery appears to have significant ostial stenosis estimated at at least 75% angiographically. The visualized portions of the external iliac and common femoral arteries are patent  Final Conclusions:  1. Severe single-vessel coronary artery disease involving the distal right coronary artery 2. Moderate LV dysfunction with segmental severe hypokinesis of the inferior wall and normal contraction of the antero-apex with estimated left ventricular ejection fraction of 45%. 3. Severe left common iliac artery stenosis Recommendations: The patient has severe distal RCA disease. She has a known history of inferior wall infarction and remote PCA. This fits with that clinical history. She has nonobstructive disease of the left coronary artery. Her left ventricular ejection fraction by ventriculography is clearly greater than 35%. With her  history of previous MI, I do not think PCI is indicated with an absence of angina. Will carefully discussed symptoms with the patient, but I am inclined to treat her medically. Will also review indication for consideration of peripheral intervention for her iliac disease.   01/2013 Echo Study Conclusions  - Left ventricle: The cavity size was mildly to moderately dilated. Wall thickness was normal. Systolic function was severely reduced. The estimated ejection fraction was 25%. Severe diffuse hypokinesis. - Aortic valve: Mildly calcified annulus. Trileaflet. - Mitral valve: Calcified annulus. - Left atrium: The atrium was moderately dilated. - Right ventricle: The cavity size was normal. Wall thickness was mildly to moderately increased. - Right atrium: The atrium was mildly dilated. - Atrial septum: No defect or patent foramen ovale was identified. Impressions:  - Compared to the prior study of 03/13/11, there has been substantial deterioration in  LV systolic function.  07/2014 Echo Study Conclusions  - Left ventricle: The cavity size was severely dilated. Wall thickness was normal. The estimated ejection fraction was 20%. Diffuse hypokinesis. There is akinesis of the inferolateral and inferior myocardium. Features are consistent with a pseudonormal left ventricular filling pattern, with concomitant abnormal relaxation and increased filling pressure (grade 2 diastolic dysfunction). Doppler parameters are consistent with high ventricular filling pressure. - Mitral valve: There was mild to moderate regurgitation. - Left atrium: The atrium was moderately dilated.  Impressions:  - Global hypokinesis; inferior and inferolateral akinesis; overall severely reduced LV function; grade 2 diastolic dysfunction with elevated left ventricular filling pressures, moderate LAE; mild to moderate MR. 07/2014 Cath PROCEDURAL FINDINGS  Hemodynamics:  AO 122/48  LV 118/14  RA 9   RV 41/11  PA 37/5 with a mean of 21  PCWP 27  Oxygen saturations  AO 90  PA 56  Cardiac output 3.5  Cardiac index 2.0  Coronary angiography:  Coronary dominance: right  Left mainstem: The left mainstem is patent with 30% distal left main stenosis.  Left anterior descending (LAD): The LAD is diffusely diseased in the midportion. There are sequential 50% stenoses, unchanged from previous study. The first diagonal Hagar Sadiq is large in distribution with diffuse nonobstructive disease noted. The mid and distal LAD are patent without significant disease.  Left circumflex (LCx): The left circumflex has 50% ostial stenosis, 50% mid vessel stenosis, and to OM branches with no significant disease. There is an intermediate Raeleigh Guinn is diffusely diseased with up to 70% stenosis. This vessel is small in caliber.  Right coronary artery (RCA): Dominant vessel. Tortuous with diffuse nonobstructive stenosis throughout the proximal and mid portions. There are diffuse 50% stenosis present. The distal vessel has an ulcerated-appearing irregular 90% stenosis involving the bifurcation of the PDA and posterolateral branches. The posterolateral branches are patent with mild diffuse disease. The PDA arises an acute angulation with 30-40% stenosis. The mid body of the PDA has an 80-90% stenosis in an area where the vessel is too small for PCI.  Left ventriculography: The ventricle appears the synchronous. The basal and midinferior wall are akinetic. The basal inferolateral wall is severely hypokinetic. The anterolateral wall and apex contract normally. The estimated LVEF is 30%.  PCI Procedure Note: Following the diagnostic procedure, the decision was made to proceed with PCI of the distal RCA. The lesion is complex, there is a large amount of myocardium involved. I felt there was a risk of potentially compromising the PDA Damany Eastman, but overall in my opinion the risk/benefit was favorable for PCI considering the  severe stenosis involving the entire RCA distribution. The sheath was upsized to a 6 [place]. Weight-based bivalirudin was given for anticoagulation. The patient was loaded with Plavix 600 mg on the table. Once a therapeutic ACT was achieved, a 6 [place] JR 4 guide catheter was inserted. A cougar coronary guidewire was used to cross the lesion into the PLA Chanetta Moosman. The lesion was predilated with a 2.5 x 15 mm balloon. The lesion was then stented with a 3.0 by 48m Promus DES stent. The stent was postdilated with a 3.25 mm noncompliant balloon to 18 atmospheres. The PDA was stented across as the stent extended from the distal RCA proximal PLA Buford Gayler. The PDA was totally occluded. I made a long attempt with a whisper wire using multiple bands on the tip of the wire, but all of these attempts were unsuccessful at rewiring the PDA. The patient was completely asymptomatic and  specifically denied any chest pain. Her hemodynamics remained stable and there were no changes in her rhythm or blood pressure. A final image of the left coronary artery was obtained and this demonstrated a collateral from the LAD to the PDA Claude Waldman. Following PCI, there was 0% residual stenosis and TIMI-3 flow. Final angiography confirmed an excellent result. Femoral hemostasis was achieved will be achieved with manual hemostasis. The patient tolerated the PCI procedure well. There were no immediate procedural complications. The patient was transferred to the post catheterization recovery area for further monitoring.  PCI Data:  Vessel - RCA/Segment - distal  Percent Stenosis (pre) 90  TIMI-flow 3  Stent 3.0 by 88m Promus DES  Percent Stenosis (post) 0  TIMI-flow (post) 3  Contrast: 175 cc Omnipaque  Radiation dose/Fluoro time: 15.5 minutes  Estimated Blood Loss: Minimal  Final Conclusions:  1. Nonobstructive disease involving the LAD, left circumflex, and OM/diagonal side branches 2. Severe stenosis of the distal RCA  bifurcation, treated successfully with a drug-eluting stent into the PLA Jamylah Marinaccio with residual occlusion of the PDA Sally-Anne Wamble 3. Severe segmental LV systolic dysfunction Recommendations: Dual antiplatelet therapy with aspirin and Plavix for at least 12 months. Will cycle cardiac enzymes since the PDA Minda Faas is occluded after stenting.  10/2014 Echo Study Conclusions  - Procedure narrative: Transthoracic echocardiography. Image quality was suboptimal. The study was technically difficult, as a result of poor sound wave transmission and body habitus. - Left ventricle: Systolic function is severely reduced, estimated EF 15-20%. The cavity size was severely dilated. Doppler parameters are consistent with abnormal left ventricular relaxation (grade 1 diastolic dysfunction). Doppler parameters are consistent with high ventricular filling pressure. Medial E/e&' 24. - Regional wall motion abnormality: Akinesis of the basal-mid inferior and basal inferolateral myocardium; severe hypokinesis of the apical inferior and mid inferolateral myocardium. Severe, diffuse hypokinesis. - Ventricular septum: Septal motion showed abnormal function and dyssynergy. These changes are consistent with intraventricular conduction delay. - Aortic valve: Mildly calcified annulus. Trileaflet; mildly thickened leaflets. - Mitral valve: Mildly dilated annulus. Mildly thickened leaflets . There was moderate eccentric regurgitation. 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 - Left atrium: The atrium was severely dilated. Volume/bsa, S: 47.1 ml/m^2. - Right ventricle: Systolic function was mildly reduced. - Right atrium: The atrium was mildly dilated. - Atrial septum: There was increased thickness of the septum, consistent with lipomatous hypertrophy. - Tricuspid valve: There was mild regurgitation. - Pulmonary arteries: PA peak pressure: 42 mm Hg (S). Mildly elevated  pulmonary pressures. - Pericardium, extracardiac: There was a small pericardial effusion, with no evidence of tamponade physiology.  04/2015 Carotid [place]: 1. Mild (1-49%) stenosis of the proximal right internal carotid artery secondary to smooth heterogeneous atherosclerotic plaque. No interval change compared to recent prior imaging from earlier this month. 2. Mild (1-49%) stenosis of the proximal left internal carotid artery secondary to heterogeneous and irregular atherosclerotic plaque. No interval change compared to recent prior imaging from earlier this month. 3. Vertebral arteries remain patent with normal antegrade flow. Signed,    10/2015 echo Study Conclusions  - Left ventricle: The cavity size was moderately to severely dilated. Wall thickness was normal. Systolic function was severely reduced. The estimated ejection fraction was in the range of 25% to 30%. Diffuse hypokinesis. There is akinesis of the basalanteroseptal and anterior myocardium. Doppler parameters are consistent with abnormal left ventricular relaxation (grade 1 diastolic dysfunction). - Mitral valve: Calcified annulus. Mildly calcified leaflets . There was mild regurgitation. - Right atrium: Central  venous pressure (est): 3 mm Hg. - Atrial septum: No defect or patent foramen ovale was identified. - Tricuspid valve: There was trivial regurgitation. - Pulmonary arteries: PA peak pressure: 27 mm Hg (S). - Pericardium, extracardiac: A prominent pericardial fat pad was present.  Impressions:  - Moderate to severe LV chamber dilatation with LVEF approximately 25-30%. There is diffuse hypokinesis with near akinesis of the basal anteroseptal and anterior myocardium. Compared to the previous study from June 2016 there has been some improvement in LVEF. Grade 1 diastolic dysfunction. MAC with mild mitral regurgitation. Trivial tricuspid regurgitation with PASP 27  mmHg.  Transthoracic echocardiography. M-mode, complete 2D, spectral Doppler, and color Doppler. Birthdate: Patient birthdate: [birth year]. Age: Patient is 77 yr old. Sex: Gender: female. BMI: 27.5 kg/m^2. Blood pressure:   98/47 Patient status: Inpatient. Study date: Study date: 10/04/2015. Study time: 11:27 AM. Location: Echo laboratory.       Assessment and Plan   1. CAD - no recent symptoms, she will continue current meds  2. Chronic systolic HF - doing we,, continue current meds. Repeat BMET/Mg since on diuretic.    3. Mitral regurgitation - mild by last echo - continue to monitor at this time.   4. Hyperlipidemia - statin changed recently due to cost, - continue current therapy.   F/u 6 months      Arnoldo Lenis, M.D.

## 2018-02-25 NOTE — Patient Instructions (Addendum)
Your physician wants you to follow-up in: 6 months with Dr.Branch You will receive a reminder letter in the mail two months in advance. If you don't receive a letter, please call our office to schedule the follow-up appointment.    Your physician recommends that you continue on your current medications as directed. Please refer to the Current Medication list given to you today.    If you need a refill on your cardiac medications before your next appointment, please call your pharmacy.    Please get lab work: BMET,Magnesiium    No test ordered today      Thank you for choosing [facility] !

## 2018-02-26 ENCOUNTER — Telehealth: Payer: Self-pay

## 2018-02-26 NOTE — Telephone Encounter (Signed)
-----   Message from Jonathan F Branch, MD sent at 02/26/2018  3:22 PM EDT ----- Decreased kidney function. I would skip her lasix tomorrow, and when she restarts take 60mg daily (confirm she has been taking 80mg daily recently). Magnesium is also low, take magnesium oxide 400mg daily x 4 days. Repeat BMET/Mg in 1 week.   J Branch MD 

## 2018-02-26 NOTE — Telephone Encounter (Signed)
Called pt. No answer, left message for pt to return call.  

## 2018-03-01 ENCOUNTER — Telehealth: Payer: Self-pay

## 2018-03-01 DIAGNOSIS — Z79899 Other long term (current) drug therapy: Secondary | ICD-10-CM

## 2018-03-01 MED ORDER — MAGNESIUM OXIDE 400 MG PO CAPS: 400.0000 mg | ORAL_CAPSULE | Freq: Every day | ORAL | 0 refills | Status: AC

## 2018-03-01 MED ORDER — FUROSEMIDE 20 MG PO TABS: 60.0000 mg | ORAL_TABLET | Freq: Every day | ORAL | 3 refills | Status: DC

## 2018-03-01 NOTE — Telephone Encounter (Signed)
Will mail pt letter. Changes made.

## 2018-03-01 NOTE — Telephone Encounter (Signed)
-----   Message from Antoine Poche, MD sent at 02/26/2018  3:22 PM EDT ----- Decreased kidney function. I would skip her lasix tomorrow, and when she restarts take 60mg  daily (confirm she has been taking 80mg  daily recently). Magnesium is also low, take magnesium oxide 400mg  daily x 4 days. Repeat BMET/Mg in 1 week.   Dominga Ferry MD

## 2018-03-01 NOTE — Telephone Encounter (Signed)
Called pt. No answer, left message for pt to returrn call.

## 2018-03-02 ENCOUNTER — Encounter: Payer: Self-pay | Admitting: Cardiology

## 2018-03-12 DIAGNOSIS — Z79899 Other long term (current) drug therapy: Secondary | ICD-10-CM | POA: Diagnosis not present

## 2018-03-12 LAB — BASIC METABOLIC PANEL
BUN / CREAT RATIO: 12 (calc) (ref 6–22)
BUN: 26 mg/dL — ABNORMAL HIGH (ref 7–25)
CALCIUM: 9.6 mg/dL (ref 8.6–10.4)
CO2: 23 mmol/L (ref 20–32)
Chloride: 93 mmol/L — ABNORMAL LOW (ref 98–110)
Creat: 2.1 mg/dL — ABNORMAL HIGH (ref 0.60–0.93)
GLUCOSE: 104 mg/dL (ref 65–139)
Potassium: 5.3 mmol/L (ref 3.5–5.3)
Sodium: 123 mmol/L — ABNORMAL LOW (ref 135–146)

## 2018-03-12 LAB — MAGNESIUM: MAGNESIUM: 1.7 mg/dL (ref 1.5–2.5)

## 2018-03-17 ENCOUNTER — Telehealth: Payer: Self-pay

## 2018-03-17 DIAGNOSIS — Z79899 Other long term (current) drug therapy: Secondary | ICD-10-CM

## 2018-03-17 MED ORDER — FUROSEMIDE 20 MG PO TABS: ORAL_TABLET | ORAL | 3 refills | Status: DC

## 2018-03-17 NOTE — Telephone Encounter (Signed)
-----   Message from Antoine Poche, MD sent at 03/17/2018  2:31 PM EDT ----- Labs look better but still suggest she is on too much fluid pill. Verify taking lasix 60mg  daily, if so decrease to 40mg  alternative with 20mg . Repeat BMET/Mg in 2 weeks   J BrancH MD

## 2018-03-17 NOTE — Telephone Encounter (Signed)
Pt notified of change. Voiced understanding. Put lab order in. Mailed labs

## 2018-03-26 ENCOUNTER — Ambulatory Visit (INDEPENDENT_AMBULATORY_CARE_PROVIDER_SITE_OTHER): Payer: Medicare Other

## 2018-03-26 ENCOUNTER — Telehealth: Payer: Self-pay

## 2018-03-26 DIAGNOSIS — I5022 Chronic systolic (congestive) heart failure: Secondary | ICD-10-CM | POA: Diagnosis not present

## 2018-03-26 DIAGNOSIS — Z95810 Presence of automatic (implantable) cardiac defibrillator: Secondary | ICD-10-CM | POA: Diagnosis not present

## 2018-03-26 NOTE — Progress Notes (Signed)
EPIC Encounter for ICM Monitoring  Patient Name: Nicole Clements is a 77 y.o. female Date: 03/26/2018 Primary Care Physican: Mayra Neer, MD Primary Cardiologist:Branch Electrophysiologist: Caryl Comes Dry Weight:Previous Weight 149lbs Bi-V Pacing: 94.8%       Attempted call to patient and unable to reach.  Left detailed message, per DPR, regarding transmission.  Transmission reviewed.    Thoracic impedance abnormal suggesting fluid accumulation since 03/20/2018.  Prescribed dosage: Furosemide 20 mg Take 40 mg alternating with 20 mg daily.  Furosemide dosage decrease on 03/17/2018 by Dr Harl Bowie due to elevated Creatinine.    Lab 03/12/2018 Creatinine 2.10, BUN 12, Potassium 5.3, Sodium 123 02/25/2018 Creatinine 2.22, BUN 16, Potassium 5.2, Sodium 121 09/11/2017 Creatinine 1.19, BUN 10, Potassium 4.6, Sodium 136, EGFR 44-51 08/10/2017 Creatinine 1.33, BUN 9, Potassium 5.5, Sodium 139, EGFR 39-47 02/09/2017 Creatinine 1.22, BUN 11, Potassium 4.2, Sodium 138 07/22/2016 Creatinine 1.30, BUN 12, Potassium 4.6, Sodium 138, EGFR 40-49  Recommendations:  Left voice mail with ICM number and encouraged to call if experiencing any fluid symptoms or use ER if needed.  Advised the importance of limiting daily salt intake.   Follow-up plan: ICM clinic phone appointment on 04/08/2018 to recheck fluid levels.   Copy of ICM check sent to Dr. Harl Bowie and Dr. Caryl Comes.   3 month ICM trend: 03/26/2018    1 Year ICM trend:       Rosalene Billings, RN 03/26/2018 12:55 PM

## 2018-03-26 NOTE — Telephone Encounter (Signed)
Remote ICM transmission received.  Attempted call to patient and left detailed message, per DPR, regarding transmission and next ICM scheduled for 04/08/2018.  Advised to return call for any fluid symptoms or questions.   

## 2018-04-08 ENCOUNTER — Ambulatory Visit (INDEPENDENT_AMBULATORY_CARE_PROVIDER_SITE_OTHER): Payer: Medicare Other | Admitting: *Deleted

## 2018-04-08 DIAGNOSIS — I255 Ischemic cardiomyopathy: Secondary | ICD-10-CM

## 2018-04-08 DIAGNOSIS — Z95810 Presence of automatic (implantable) cardiac defibrillator: Secondary | ICD-10-CM

## 2018-04-08 DIAGNOSIS — I5022 Chronic systolic (congestive) heart failure: Secondary | ICD-10-CM

## 2018-04-08 NOTE — Progress Notes (Signed)
Remote ICD transmission.   

## 2018-04-08 NOTE — Progress Notes (Signed)
EPIC Encounter for ICM Monitoring  Patient Name: Nicole Clements is a 77 y.o. female Date: 04/08/2018 Primary Care Physican: Mayra Neer, MD Primary Cardiologist:Branch Electrophysiologist: Caryl Comes Dry Weight:149lbs Bi-V Pacing: 87.3%       Heart Failure questions reviewed, pt asymptomatic. She stated she is feeling fine.    Thoracic impedance abnormal suggesting fluid accumulation since 03/20/2018.  Prescribed dosage: Furosemide 20 mg take 40 mg alternating with 20 mg daily.  Furosemide dosage decreased on 03/17/2018 by Dr Harl Bowie due to elevated Creatinine.    Lab 03/12/2018 Creatinine 2.10, BUN 12, Potassium 5.3, Sodium 123 02/25/2018 Creatinine 2.22, BUN 16, Potassium 5.2, Sodium 121 09/11/2017 Creatinine 1.19, BUN 10, Potassium 4.6, Sodium 136, EGFR 44-51 08/10/2017 Creatinine 1.33, BUN 9, Potassium 5.5, Sodium 139, EGFR 39-47 02/09/2017 Creatinine 1.22, BUN 11, Potassium 4.2, Sodium 138 07/22/2016 Creatinine 1.30, BUN 12, Potassium 4.6, Sodium 138, EGFR 40-49  Recommendations: No changes.  Reinforced sodium restriction. Patient eats in restaurants frequently.  Encouraged to call for fluid symptoms.  Follow-up plan: ICM clinic phone appointment on 04/29/2018.    Copy of ICM check sent to Dr. Harl Bowie and Dr. Caryl Comes for review and if any recommendations will call back.   3 month ICM trend: 04/08/2018    1 Year ICM trend:       Rosalene Billings, RN 04/08/2018 3:35 PM

## 2018-04-29 ENCOUNTER — Ambulatory Visit (INDEPENDENT_AMBULATORY_CARE_PROVIDER_SITE_OTHER): Payer: Medicare Other

## 2018-04-29 DIAGNOSIS — Z95810 Presence of automatic (implantable) cardiac defibrillator: Secondary | ICD-10-CM

## 2018-04-29 DIAGNOSIS — I5022 Chronic systolic (congestive) heart failure: Secondary | ICD-10-CM

## 2018-04-29 NOTE — Progress Notes (Signed)
EPIC Encounter for ICM Monitoring  Patient Name: Nicole Clements is a 77 y.o. female Date: 04/29/2018 Primary Care Physican: Mayra Neer, MD Primary Cardiologist:Branch Electrophysiologist: Caryl Comes Dry Weight:149lbs Bi-V Pacing: 90.9%       Heart Failure questions reviewed, pt asymptomatic and denied any fluid symptoms.  She stated she feels fine.   Thoracic impedance abnormal suggesting fluid accumulation starting 04/17/2018.  Prescribed dosage: Furosemide 20 mgtake 2 tablets (40 mg total) alternating with 1 tablet (20 mg total) daily.Furosemide dosage decreased on 03/17/2018 by Dr Harl Bowie due to elevated Creatinine.  Lab 03/12/2018 Creatinine 2.10, BUN 12, Potassium 5.3, Sodium 123 02/25/2018 Creatinine 2.22, BUN 16, Potassium 5.2, Sodium 121 09/11/2017 Creatinine 1.19, BUN 10, Potassium 4.6, Sodium 136, EGFR 44-51 08/10/2017 Creatinine 1.33, BUN 9, Potassium 5.5, Sodium 139, EGFR 39-47 02/09/2017 Creatinine 1.22, BUN 11, Potassium 4.2, Sodium 138 07/22/2016 Creatinine 1.30, BUN 12, Potassium 4.6, Sodium 138, EGFR 40-49  Recommendations:  Decrease salt intake.  Patient eats out at restaurants frequently.  Encouraged to call for fluid symptoms.  Follow-up plan: ICM clinic phone appointment on 05/13/2018 to recheck fluid levels.    Copy of ICM check sent to Dr. Caryl Comes and Dr. Harl Bowie for review and recommendations if needed.   3 month ICM trend: 04/29/2018    1 Year ICM trend:       Rosalene Billings, RN 04/29/2018 3:16 PM

## 2018-05-13 ENCOUNTER — Ambulatory Visit (INDEPENDENT_AMBULATORY_CARE_PROVIDER_SITE_OTHER): Payer: Self-pay

## 2018-05-13 DIAGNOSIS — Z95810 Presence of automatic (implantable) cardiac defibrillator: Secondary | ICD-10-CM

## 2018-05-13 DIAGNOSIS — I5022 Chronic systolic (congestive) heart failure: Secondary | ICD-10-CM

## 2018-05-13 NOTE — Progress Notes (Signed)
EPIC Encounter for ICM Monitoring  Patient Name: Nicole Clements is a 77 y.o. female Date: 05/13/2018 Primary Care Physican: Mayra Neer, MD Primary Cardiologist:Branch Electrophysiologist: Caryl Comes Dry Weight:Previous weight 149lbs Bi-V Pacing:90%      Attempted call to patient and unable to reach.  Left detailed message, per DPR, regarding transmission.  Transmission reviewed.    Thoracic impedance continues to be abnormal suggesting fluid accumulation starting 04/17/2018.  Prescribed dosage: Furosemide 20 mgtake 2 tablets (40 mg total) alternating with 1 tablet (20 mg total) daily.Furosemide dosage decreasedon 03/17/2018 by Dr Harl Bowie due to elevated Creatinine.  Lab 03/12/2018 Creatinine 2.10, BUN 12, Potassium 5.3, Sodium 123 02/25/2018 Creatinine 2.22, BUN 16, Potassium 5.2, Sodium 121 09/11/2017 Creatinine 1.19, BUN 10, Potassium 4.6, Sodium 136, EGFR 44-51 08/10/2017 Creatinine 1.33, BUN 9, Potassium 5.5, Sodium 139, EGFR 39-47 02/09/2017 Creatinine 1.22, BUN 11, Potassium 4.2, Sodium 138  Recommendations: Left voice mail with ICM number and encouraged to call if experiencing any fluid symptoms.  Follow-up plan: ICM clinic phone appointment on 06/07/2018.   Due to make an appointment with Dr Caryl Comes for yearly f/u.     Copy of ICM check sent to Dr. Caryl Comes and Dr Harl Bowie for review and if any recommendations will call back.   3 month ICM trend: 05/13/2018    1 Year ICM trend:       Rosalene Billings, RN 05/13/2018 10:51 AM

## 2018-05-14 ENCOUNTER — Telehealth: Payer: Self-pay

## 2018-05-14 NOTE — Telephone Encounter (Signed)
Remote ICM transmission received.  Attempted call to patient and left detailed message, per DPR, regarding transmission and next ICM scheduled for 06/07/2018.  Advised to return call for any fluid symptoms or questions.    

## 2018-05-26 LAB — CUP PACEART REMOTE DEVICE CHECK
Battery Remaining Longevity: 56 mo
Brady Statistic AP VP Percent: 89.54 %
Brady Statistic AP VS Percent: 1.32 %
Brady Statistic AS VP Percent: 9.08 %
Brady Statistic RA Percent Paced: 90.22 %
Brady Statistic RV Percent Paced: 48.39 %
HighPow Impedance: 67 Ohm
Implantable Lead Implant Date: 20160615
Implantable Lead Location: 753860
Implantable Lead Model: 5076
Implantable Pulse Generator Implant Date: 20160615
Lead Channel Impedance Value: 399 Ohm
Lead Channel Impedance Value: 418 Ohm
Lead Channel Impedance Value: 456 Ohm
Lead Channel Impedance Value: 475 Ohm
Lead Channel Impedance Value: 589 Ohm
Lead Channel Impedance Value: 608 Ohm
Lead Channel Impedance Value: 646 Ohm
Lead Channel Impedance Value: 646 Ohm
Lead Channel Impedance Value: 988 Ohm
Lead Channel Pacing Threshold Pulse Width: 0.4 ms
Lead Channel Sensing Intrinsic Amplitude: 1.25 mV
Lead Channel Sensing Intrinsic Amplitude: 1.25 mV
Lead Channel Sensing Intrinsic Amplitude: 13.25 mV
Lead Channel Sensing Intrinsic Amplitude: 13.25 mV
Lead Channel Setting Pacing Amplitude: 2 V
Lead Channel Setting Pacing Amplitude: 2 V
Lead Channel Setting Sensing Sensitivity: 0.45 mV
MDC IDC LEAD IMPLANT DT: 20160615
MDC IDC LEAD IMPLANT DT: 20160615
MDC IDC LEAD LOCATION: 753858
MDC IDC LEAD LOCATION: 753859
MDC IDC MSMT BATTERY VOLTAGE: 2.97 V
MDC IDC MSMT LEADCHNL LV IMPEDANCE VALUE: 760 Ohm
MDC IDC MSMT LEADCHNL LV IMPEDANCE VALUE: 874 Ohm
MDC IDC MSMT LEADCHNL LV IMPEDANCE VALUE: 931 Ohm
MDC IDC MSMT LEADCHNL LV PACING THRESHOLD AMPLITUDE: 0.875 V
MDC IDC MSMT LEADCHNL LV PACING THRESHOLD PULSEWIDTH: 0.4 ms
MDC IDC MSMT LEADCHNL RA IMPEDANCE VALUE: 456 Ohm
MDC IDC MSMT LEADCHNL RA PACING THRESHOLD AMPLITUDE: 0.625 V
MDC IDC MSMT LEADCHNL RA PACING THRESHOLD PULSEWIDTH: 0.4 ms
MDC IDC MSMT LEADCHNL RV PACING THRESHOLD AMPLITUDE: 0.5 V
MDC IDC SESS DTM: 20190606052505
MDC IDC SET LEADCHNL LV PACING PULSEWIDTH: 0.4 ms
MDC IDC SET LEADCHNL RA PACING AMPLITUDE: 1.5 V
MDC IDC SET LEADCHNL RV PACING PULSEWIDTH: 0.4 ms
MDC IDC STAT BRADY AS VS PERCENT: 0.06 %

## 2018-06-07 ENCOUNTER — Ambulatory Visit (INDEPENDENT_AMBULATORY_CARE_PROVIDER_SITE_OTHER): Payer: Medicare Other

## 2018-06-07 DIAGNOSIS — I5022 Chronic systolic (congestive) heart failure: Secondary | ICD-10-CM | POA: Diagnosis not present

## 2018-06-07 DIAGNOSIS — Z95810 Presence of automatic (implantable) cardiac defibrillator: Secondary | ICD-10-CM

## 2018-06-07 NOTE — Progress Notes (Signed)
EPIC Encounter for ICM Monitoring  Patient Name: Nicole Clements is a 77 y.o. female Date: 06/07/2018 Primary Care Physican: Mayra Neer, MD Primary Cardiologist:Branch Electrophysiologist: Caryl Comes Dry Weight: 149lbs Bi-V Pacing:90%      Heart Failure questions reviewed, pt asymptomatic.  Does not follow low salt diet   Thoracic impedance returning to baseline normal.  Prescribed dosage: Furosemide 20 mgtake2 tablets (40 mgtotal)alternating with 1 tablet (20 mgtotal)daily.Furosemide dosage decreasedon 03/17/2018 by Dr Harl Bowie due to elevated Creatinine.  Lab 03/12/2018 Creatinine 2.10, BUN 12, Potassium 5.3, Sodium 123 02/25/2018 Creatinine 2.22, BUN 16, Potassium 5.2, Sodium 121 09/11/2017 Creatinine 1.19, BUN 10, Potassium 4.6, Sodium 136, EGFR 44-51 08/10/2017 Creatinine 1.33, BUN 9, Potassium 5.5, Sodium 139, EGFR 39-47 02/09/2017 Creatinine 1.22, BUN 11, Potassium 4.2, Sodium 138  Recommendations: No changes.  Encouraged to call for fluid symptoms.  Follow-up plan: ICM clinic phone appointment on 07/08/2018.    Copy of ICM check sent to Dr. Caryl Comes.   3 month ICM trend: 06/07/2018    1 Year ICM trend:       Rosalene Billings, RN 06/07/2018 1:00 PM

## 2018-07-08 ENCOUNTER — Ambulatory Visit (INDEPENDENT_AMBULATORY_CARE_PROVIDER_SITE_OTHER): Payer: Medicare Other | Admitting: *Deleted

## 2018-07-08 ENCOUNTER — Ambulatory Visit (INDEPENDENT_AMBULATORY_CARE_PROVIDER_SITE_OTHER): Payer: Medicare Other

## 2018-07-08 DIAGNOSIS — I5022 Chronic systolic (congestive) heart failure: Secondary | ICD-10-CM

## 2018-07-08 DIAGNOSIS — I255 Ischemic cardiomyopathy: Secondary | ICD-10-CM | POA: Diagnosis not present

## 2018-07-08 DIAGNOSIS — Z95810 Presence of automatic (implantable) cardiac defibrillator: Secondary | ICD-10-CM | POA: Diagnosis not present

## 2018-07-08 NOTE — Progress Notes (Signed)
Remote ICD transmission.   

## 2018-07-09 NOTE — Progress Notes (Signed)
EPIC Encounter for ICM Monitoring  Patient Name: Nicole Clements is a 77 y.o. female Date: 07/09/2018 Primary Care Physican: Mayra Neer, MD Primary Cardiologist:Branch Electrophysiologist: Caryl Comes Dry Weight: 149lbs Bi-V Pacing:91.8%      Heart Failure questions reviewed, pt asymptomatic.    Thoracic impedance abnormal suggesting fluid accumulation.  Prescribed dosage: Furosemide 20 mgtake2 tablets (40 mgtotal)alternating with 1 tablet (20 mgtotal)daily.Furosemide dosage decreasedon 03/17/2018 by Dr Harl Bowie due to elevated Creatinine.  Lab 03/12/2018 Creatinine 2.10, BUN 12, Potassium 5.3, Sodium 123 02/25/2018 Creatinine 2.22, BUN 16, Potassium 5.2, Sodium 121 09/11/2017 Creatinine 1.19, BUN 10, Potassium 4.6, Sodium 136, EGFR 44-51 08/10/2017 Creatinine 1.33, BUN 9, Potassium 5.5, Sodium 139, EGFR 39-47 02/09/2017 Creatinine 1.22, BUN 11, Potassium 4.2, Sodium 138  Recommendations: No changes.  Discussed at length salt and fluid intake.  She says she does not eat more than 2000 mg salt daily and fluid intake is less than 64 oz daily.  She does eat canned foods and advised to rinse the vegetables to help remove salt.  Encouraged to call for fluid symptoms.  Follow-up plan: ICM clinic phone appointment on 07/15/2018 to recheck fluid levels.      Copy of ICM check sent to Dr. Caryl Comes and Dr Harl Bowie for review and recommendations if needed.   3 month ICM trend: 07/08/2018    1 Year ICM trend:       Nicole Billings, RN 07/09/2018 2:31 PM

## 2018-07-15 ENCOUNTER — Ambulatory Visit (INDEPENDENT_AMBULATORY_CARE_PROVIDER_SITE_OTHER): Payer: Medicare Other

## 2018-07-15 DIAGNOSIS — Z95810 Presence of automatic (implantable) cardiac defibrillator: Secondary | ICD-10-CM

## 2018-07-15 DIAGNOSIS — I5022 Chronic systolic (congestive) heart failure: Secondary | ICD-10-CM

## 2018-07-16 NOTE — Progress Notes (Signed)
EPIC Encounter for ICM Monitoring  Patient Name: Nicole Clements is a 77 y.o. female Date: 07/16/2018 Primary Care Physican: Mayra Neer, MD Primary Cardiologist:Branch Electrophysiologist: Caryl Comes Dry Weight: 149lbs Bi-V Pacing:95.4%           Heart Failure questions reviewed, pt asymptomatic.   Thoracic impedance returned to baseline normal since last ICM remote transmission since 07/08/2018.  Prescribed: Furosemide 20 mgtake2 tablets (40 mgtotal)alternating with 1 tablet (20 mgtotal)daily.Furosemide dosage decreasedon 03/17/2018 by Dr Harl Bowie due to elevated Creatinine.  Lab 03/12/2018 Creatinine 2.10, BUN 12, Potassium 5.3, Sodium 123 02/25/2018 Creatinine 2.22, BUN 16, Potassium 5.2, Sodium 121 09/11/2017 Creatinine 1.19, BUN 10, Potassium 4.6, Sodium 136, EGFR 44-51 08/10/2017 Creatinine 1.33, BUN 9, Potassium 5.5, Sodium 139, EGFR 39-47 02/09/2017 Creatinine 1.22, BUN 11, Potassium 4.2, Sodium 138  Recommendations: No changes.  Encouraged to call for fluid symptoms.  Follow-up plan: ICM clinic phone appointment on 08/10/2018.    Copy of ICM check sent to Dr. Caryl Comes.   3 month ICM trend: 07/15/2018    1 Year ICM trend:       Rosalene Billings, RN 07/16/2018 11:06 AM

## 2018-07-21 ENCOUNTER — Other Ambulatory Visit (HOSPITAL_COMMUNITY): Payer: Self-pay | Admitting: Family Medicine

## 2018-07-21 DIAGNOSIS — Z1231 Encounter for screening mammogram for malignant neoplasm of breast: Secondary | ICD-10-CM

## 2018-07-26 ENCOUNTER — Ambulatory Visit (HOSPITAL_COMMUNITY): Payer: Medicare Other

## 2018-07-29 ENCOUNTER — Ambulatory Visit (HOSPITAL_COMMUNITY)
Admission: RE | Admit: 2018-07-29 | Discharge: 2018-07-29 | Disposition: A | Discharge status: Home / Self Care | Payer: Medicare Other | Source: Ambulatory Visit | Attending: Family Medicine | Admitting: Family Medicine

## 2018-07-29 DIAGNOSIS — Z1231 Encounter for screening mammogram for malignant neoplasm of breast: Secondary | ICD-10-CM | POA: Insufficient documentation

## 2018-08-03 LAB — CUP PACEART REMOTE DEVICE CHECK
Battery Remaining Longevity: 49 mo
Brady Statistic AS VP Percent: 6.56 %
Brady Statistic RA Percent Paced: 92.03 %
Date Time Interrogation Session: 20190905062604
HIGH POWER IMPEDANCE MEASURED VALUE: 57 Ohm
Implantable Lead Implant Date: 20160615
Implantable Lead Implant Date: 20160615
Implantable Lead Location: 753858
Implantable Lead Location: 753859
Implantable Lead Model: 4398
Implantable Pulse Generator Implant Date: 20160615
Lead Channel Impedance Value: 418 Ohm
Lead Channel Impedance Value: 513 Ohm
Lead Channel Impedance Value: 513 Ohm
Lead Channel Impedance Value: 589 Ohm
Lead Channel Impedance Value: 646 Ohm
Lead Channel Impedance Value: 722 Ohm
Lead Channel Impedance Value: 817 Ohm
Lead Channel Impedance Value: 874 Ohm
Lead Channel Pacing Threshold Amplitude: 0.625 V
Lead Channel Pacing Threshold Pulse Width: 0.4 ms
Lead Channel Pacing Threshold Pulse Width: 0.4 ms
Lead Channel Pacing Threshold Pulse Width: 0.4 ms
Lead Channel Sensing Intrinsic Amplitude: 12.125 mV
Lead Channel Sensing Intrinsic Amplitude: 12.125 mV
Lead Channel Setting Pacing Amplitude: 1.5 V
Lead Channel Setting Pacing Amplitude: 2 V
Lead Channel Setting Pacing Amplitude: 2.25 V
Lead Channel Setting Pacing Pulse Width: 0.4 ms
MDC IDC LEAD IMPLANT DT: 20160615
MDC IDC LEAD LOCATION: 753860
MDC IDC MSMT BATTERY VOLTAGE: 2.97 V
MDC IDC MSMT LEADCHNL LV IMPEDANCE VALUE: 361 Ohm
MDC IDC MSMT LEADCHNL LV IMPEDANCE VALUE: 399 Ohm
MDC IDC MSMT LEADCHNL LV IMPEDANCE VALUE: 399 Ohm
MDC IDC MSMT LEADCHNL LV IMPEDANCE VALUE: 646 Ohm
MDC IDC MSMT LEADCHNL LV IMPEDANCE VALUE: 931 Ohm
MDC IDC MSMT LEADCHNL LV PACING THRESHOLD AMPLITUDE: 1.25 V
MDC IDC MSMT LEADCHNL RA SENSING INTR AMPL: 2.5 mV
MDC IDC MSMT LEADCHNL RA SENSING INTR AMPL: 2.5 mV
MDC IDC MSMT LEADCHNL RV PACING THRESHOLD AMPLITUDE: 0.5 V
MDC IDC SET LEADCHNL RV PACING PULSEWIDTH: 0.4 ms
MDC IDC SET LEADCHNL RV SENSING SENSITIVITY: 0.45 mV
MDC IDC STAT BRADY AP VP PERCENT: 92.07 %
MDC IDC STAT BRADY AP VS PERCENT: 1.33 %
MDC IDC STAT BRADY AS VS PERCENT: 0.04 %
MDC IDC STAT BRADY RV PERCENT PACED: 59.99 %

## 2018-08-10 ENCOUNTER — Telehealth: Payer: Self-pay

## 2018-08-10 ENCOUNTER — Ambulatory Visit (INDEPENDENT_AMBULATORY_CARE_PROVIDER_SITE_OTHER): Payer: Medicare Other

## 2018-08-10 DIAGNOSIS — I5022 Chronic systolic (congestive) heart failure: Secondary | ICD-10-CM | POA: Diagnosis not present

## 2018-08-10 DIAGNOSIS — Z95810 Presence of automatic (implantable) cardiac defibrillator: Secondary | ICD-10-CM | POA: Diagnosis not present

## 2018-08-10 NOTE — Telephone Encounter (Signed)
Remote ICM transmission received.  Attempted call to patient and left detailed message, per DPR, regarding transmission and next ICM scheduled for 09/13/2018.  Advised to return call for any fluid symptoms or questions.    

## 2018-08-10 NOTE — Progress Notes (Signed)
EPIC Encounter for ICM Monitoring  Patient Name: Nicole Clements is a 77 y.o. female Date: 08/10/2018 Primary Care Physican: Mayra Neer, MD Primary Cardiologist:Branch Electrophysiologist: Caryl Comes Dry Weight: Previous weight 149lbs Bi-V Pacing:90.2%      Attempted call to patient and unable to reach.  Left detailed message, per DPR, regarding transmission.  Transmission reviewed.    Thoracic impedance normal.  Prescribed: Furosemide 20 mgtake2 tablets (40 mgtotal)alternating with 1 tablet (20 mgtotal)daily.Furosemide dosage decreasedon 03/17/2018 by Dr Harl Bowie due to elevated Creatinine.  Lab 03/12/2018 Creatinine 2.10, BUN 12, Potassium 5.3, Sodium 123 02/25/2018 Creatinine 2.22, BUN 16, Potassium 5.2, Sodium 121 09/11/2017 Creatinine 1.19, BUN 10, Potassium 4.6, Sodium 136, EGFR 44-51 08/10/2017 Creatinine 1.33, BUN 9, Potassium 5.5, Sodium 139, EGFR 39-47 02/09/2017 Creatinine 1.22, BUN 11, Potassium 4.2, Sodium 138  Recommendations: Left voice mail with ICM number and encouraged to call if experiencing any fluid symptoms.  Follow-up plan: ICM clinic phone appointment on 09/13/2018.  Office appointment scheduled 09/17/2018 with Dr Harl Bowie.  Copy of ICM check sent to Dr. Caryl Comes.   3 month ICM trend: 08/10/2018    1 Year ICM trend:       Rosalene Billings, RN 08/10/2018 3:45 PM

## 2018-08-16 ENCOUNTER — Other Ambulatory Visit: Payer: Self-pay | Admitting: Cardiology

## 2018-08-16 MED ORDER — LOSARTAN POTASSIUM 25 MG PO TABS: 25.0000 mg | ORAL_TABLET | Freq: Every day | ORAL | 1 refills | Status: DC

## 2018-08-16 NOTE — Telephone Encounter (Signed)
° °  1. Which medications need to be refilled? (please list name of each medication and dose if known) Losartan 25mg   2. Which pharmacy/location (including street and city if local pharmacy) is medication to be sent to? [facility] on [address] in [place]  3. Do they need a 30 day or 90 day supply? 90 day supply   Last filled: 05/12/2018

## 2018-08-16 NOTE — Telephone Encounter (Signed)
Medication sent to pharmacy  

## 2018-08-20 DIAGNOSIS — I25119 Atherosclerotic heart disease of native coronary artery with unspecified angina pectoris: Secondary | ICD-10-CM | POA: Diagnosis not present

## 2018-08-20 DIAGNOSIS — E782 Mixed hyperlipidemia: Secondary | ICD-10-CM | POA: Diagnosis not present

## 2018-08-20 DIAGNOSIS — N183 Chronic kidney disease, stage 3 (moderate): Secondary | ICD-10-CM | POA: Diagnosis not present

## 2018-08-20 DIAGNOSIS — E063 Autoimmune thyroiditis: Secondary | ICD-10-CM | POA: Diagnosis not present

## 2018-08-20 DIAGNOSIS — Z950 Presence of cardiac pacemaker: Secondary | ICD-10-CM | POA: Diagnosis not present

## 2018-08-20 DIAGNOSIS — Z720 Tobacco use: Secondary | ICD-10-CM | POA: Diagnosis not present

## 2018-08-20 DIAGNOSIS — I340 Nonrheumatic mitral (valve) insufficiency: Secondary | ICD-10-CM | POA: Diagnosis not present

## 2018-08-20 DIAGNOSIS — Z23 Encounter for immunization: Secondary | ICD-10-CM | POA: Diagnosis not present

## 2018-08-20 DIAGNOSIS — Z0000 Encounter for general adult medical examination without abnormal findings: Secondary | ICD-10-CM | POA: Diagnosis not present

## 2018-08-20 DIAGNOSIS — I5022 Chronic systolic (congestive) heart failure: Secondary | ICD-10-CM | POA: Diagnosis not present

## 2018-08-20 DIAGNOSIS — I129 Hypertensive chronic kidney disease with stage 1 through stage 4 chronic kidney disease, or unspecified chronic kidney disease: Secondary | ICD-10-CM | POA: Diagnosis not present

## 2018-08-30 DIAGNOSIS — N179 Acute kidney failure, unspecified: Secondary | ICD-10-CM | POA: Diagnosis not present

## 2018-09-01 ENCOUNTER — Other Ambulatory Visit: Payer: Self-pay

## 2018-09-01 MED ORDER — SPIRONOLACTONE 25 MG PO TABS: 12.5000 mg | ORAL_TABLET | Freq: Two times a day (BID) | ORAL | 3 refills | Status: DC

## 2018-09-01 NOTE — Telephone Encounter (Signed)
Refilled aldactone per fax request 

## 2018-09-07 DIAGNOSIS — E871 Hypo-osmolality and hyponatremia: Secondary | ICD-10-CM | POA: Diagnosis not present

## 2018-09-13 ENCOUNTER — Telehealth: Payer: Self-pay | Admitting: Cardiology

## 2018-09-13 NOTE — Telephone Encounter (Signed)
LMOVM reminding pt to send remote transmission.   

## 2018-09-17 ENCOUNTER — Encounter: Payer: Self-pay | Admitting: Cardiology

## 2018-09-17 ENCOUNTER — Ambulatory Visit (INDEPENDENT_AMBULATORY_CARE_PROVIDER_SITE_OTHER): Payer: Medicare Other | Admitting: Cardiology

## 2018-09-17 VITALS — BP 108/58 | HR 84 | Ht 62.0 in | Wt 142.0 lb

## 2018-09-17 DIAGNOSIS — E782 Mixed hyperlipidemia: Secondary | ICD-10-CM

## 2018-09-17 DIAGNOSIS — I255 Ischemic cardiomyopathy: Secondary | ICD-10-CM | POA: Diagnosis not present

## 2018-09-17 DIAGNOSIS — I5022 Chronic systolic (congestive) heart failure: Secondary | ICD-10-CM

## 2018-09-17 DIAGNOSIS — N183 Chronic kidney disease, stage 3 unspecified: Secondary | ICD-10-CM

## 2018-09-17 DIAGNOSIS — I2510 Atherosclerotic heart disease of native coronary artery without angina pectoris: Secondary | ICD-10-CM | POA: Diagnosis not present

## 2018-09-17 MED ORDER — FUROSEMIDE 20 MG PO TABS: ORAL_TABLET | ORAL | 3 refills | Status: DC

## 2018-09-17 NOTE — Progress Notes (Signed)
Clinical Summary Nicole Clements is a 77 y.o.female seen today for follow up of the following medical problems.   1. 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 Admit 07/2014 with CHF and troponin elevation - cath 07/2014, LM 30% distal, LAD mid 50%, LCX ostial 50% and mid 50%, small intermediate Gilman Olazabal 70%. RCA diffuse 50%, distal RCA with ulcerated 90% lesion, PDA 80-90% too small for PCI. LVEF 30%. Received DES to distal RCA, post procedure the PDA became occluded.  - echo 07/2014 LVEF 20%, inferior akinesis, grade II diastolic dysfunction. Repeat echo 10/2014 LVEF remains 15-20%.  - 04/2015 CRT-D device placed by Dr Caryl Comes. Normal function Jan 2018.  10/2015 echo LVEF 25-30% - could not afford entresto, back on losartan.    - we lowered lasix to 32m alternating with 221mdue to ongoing renal dysfunction. Cr down from 2.1 to 1.79 by 08/2018 labs. - no recent SOB, no recent edema. She did stop her lasix for a week per pcp due to kidney function, now back on - compliant with meds   2. Mitral regurgitation - mild by most recent echo - no recent issues  3. Hyperlipidemia - atorva was too expensive, changed to pravastatinby pcp - 08/2018 TC 114 HDL 35 TG 79 LDL 63     Past Medical History:  Diagnosis Date  . AICD (automatic cardioverter/defibrillator) present   . CAD (coronary artery disease) April 2014   DES to PLA, occluded PDA 07/2014  . CHF (congestive heart failure) ([facility]  . Hypercholesterolemia   . Hypertension   . Ischemic cardiomyopathy    LVEF 25%-45%  . IVCD (intraventricular conduction defect)   . PVC's (premature ventricular contractions)   . S/P colonoscopy August 2007   Hyperplastic polyps, rare sigmoid and descending colon diverticulosis, small internal hemorrhoids  . STEMI (ST elevation myocardial infarction) ([facility]     Allergies  Allergen Reactions  . Lactose Intolerance (Gi) Other (See Comments)    GI Upset   . Sulfa  Antibiotics Rash     Current Outpatient Medications  Medication Sig Dispense Refill  . acetaminophen (TYLENOL) 500 MG tablet Take 1,000 mg by mouth every 8 (eight) hours as needed for mild pain or headache.    . Marland Kitchenspirin EC 81 MG tablet Take 1 tablet (81 mg total) by mouth daily. 90 tablet 3  . carvedilol (COREG) 25 MG tablet take 1 tablet by mouth twice a day 60 tablet 11  . Cholecalciferol (VITAMIN D3) 2000 UNITS capsule Take 2,000 Units by mouth daily.      . furosemide (LASIX) 20 MG tablet Take 40 mg alternating with 20 mg daily. 270 tablet 3  . losartan (COZAAR) 25 MG tablet Take 1 tablet (25 mg total) by mouth daily. 90 tablet 1  . nitroGLYCERIN (NITROSTAT) 0.4 MG SL tablet Place 1 tablet (0.4 mg total) under the tongue every 5 (five) minutes x 3 doses as needed for chest pain. 25 tablet 2  . Omega-3 Fatty Acids (FISH OIL) 1200 MG CAPS Take 1,200 mg by mouth daily.    . potassium chloride SA (K-DUR,KLOR-CON) 20 MEQ tablet Take 1 tablet (20 mEq total) by mouth daily. 90 tablet 2  . pravastatin (PRAVACHOL) 80 MG tablet Take 80 mg by mouth daily.    . Marland Kitchenpironolactone (ALDACTONE) 25 MG tablet Take 0.5 tablets (12.5 mg total) by mouth 2 (two) times daily. 90 tablet 3   No current facility-administered medications for this visit.  Past Surgical History:  Procedure Laterality Date  . ABDOMINAL HYSTERECTOMY    . CARDIAC CATHETERIZATION  April 2014   Med Rx  . CATARACT EXTRACTION W/PHACO Right 09/15/2017   Procedure: CATARACT EXTRACTION PHACO AND INTRAOCULAR LENS PLACEMENT (IOC);  Surgeon: Rutherford Guys, MD;  Location: [facility];  Service: Ophthalmology;  Laterality: Right;  CDE: 12.22  . CATARACT EXTRACTION W/PHACO Left 09/29/2017   Procedure: CATARACT EXTRACTION PHACO AND INTRAOCULAR LENS PLACEMENT (IOC);  Surgeon: Rutherford Guys, MD;  Location: [facility];  Service: Ophthalmology;  Laterality: Left;  CDE: 10.02  . COLONOSCOPY N/A 12/24/2015   Dr. Fields:moderate sized internal  hemorrhoids/moderate diverticulosis, negative microscopic colitis   . EP IMPLANTABLE DEVICE  04/18/2015   BV ICD  . EP IMPLANTABLE DEVICE N/A 04/18/2015   Procedure: BiV ICD Insertion CRT-D;  Surgeon: Deboraha Sprang, MD;  Location: [facility];  Service: Cardiovascular;  Laterality: N/A;  . LEFT AND RIGHT HEART CATHETERIZATION WITH CORONARY ANGIOGRAM N/A 07/17/2014   Procedure: LEFT AND RIGHT HEART CATHETERIZATION WITH CORONARY ANGIOGRAM;  Surgeon: Blane Ohara, MD;  Location: [facility] CATH LAB;  Service: Cardiovascular;  Laterality: N/A;  . PERCUTANEOUS CORONARY STENT INTERVENTION (PCI-S)  07/17/2014   Procedure: PERCUTANEOUS CORONARY STENT INTERVENTION (PCI-S);  Surgeon: Blane Ohara, MD;  Location: [facility] CATH LAB;  Service: Cardiovascular;;  Distal RCA  . S/P Hysterectomy       Allergies  Allergen Reactions  . Lactose Intolerance (Gi) Other (See Comments)    GI Upset   . Sulfa Antibiotics Rash      Family History  Problem Relation Age of Onset  . Hypertension Sister   . Diabetes Mellitus II Sister   . Hypertension Brother   . Diabetes Mellitus II Brother   . Hypertension Brother   . Diabetes Mellitus II Brother   . Hypertension Brother   . Diabetes Mellitus II Brother   . Colon cancer Neg Hx      Social History Nicole Clements reports that she has been smoking cigarettes. She started smoking about 56 years ago. She has a 25.00 pack-year smoking history. She has never used smokeless tobacco. Nicole Clements reports that she does not drink alcohol.   Review of Systems CONSTITUTIONAL: No weight loss, fever, chills, weakness or fatigue.  HEENT: Eyes: No visual loss, blurred vision, double vision or yellow sclerae.No hearing loss, sneezing, congestion, runny nose or sore throat.  SKIN: No rash or itching.  CARDIOVASCULAR: per hpi RESPIRATORY: No shortness of breath, cough or sputum.  GASTROINTESTINAL: No anorexia, nausea, vomiting or diarrhea. No abdominal pain or blood.    GENITOURINARY: No burning on urination, no polyuria NEUROLOGICAL: No headache, dizziness, syncope, paralysis, ataxia, numbness or tingling in the extremities. No change in bowel or bladder control.  MUSCULOSKELETAL: No muscle, back pain, joint pain or stiffness.  LYMPHATICS: No enlarged nodes. No history of splenectomy.  PSYCHIATRIC: No history of depression or anxiety.  ENDOCRINOLOGIC: No reports of sweating, cold or heat intolerance. No polyuria or polydipsia.  [name]   Physical Examination Vitals:   09/17/18 0901  BP: (!) 108/58  Pulse: 84  SpO2: 94%   Vitals:   09/17/18 0901  Weight: 142 lb (64.4 kg)  Height: 5' 2"  (1.575 m)    Gen: resting comfortably, no acute distress HEENT: no scleral icterus, pupils equal round and reactive, no palptable cervical adenopathy,  CV: RRR, no m/r/,g no jvd Resp: Clear to auscultation bilaterally GI: abdomen is soft, non-tender, non-distended, normal bowel sounds, no hepatosplenomegaly MSK:  extremities are warm, no edema.  Skin: warm, no rash Neuro:  no focal deficits Psych: appropriate affect   Diagnostic Studies 02/2013 [facility] Procedural Findings:  Hemodynamics:  AO 116/45 with a mean of 70  LV 120/18  Coronary angiography:  Coronary dominance: right  Left mainstem: The left main is patent with 30% distal left main stenosis as the vessel trifurcates into the LAD, intermediate Issabelle Mcraney, and left circumflex.  Left anterior descending (LAD): The LAD is patent with diffuse disease noted. There is minimal calcification present. The first diagonal is patent with 30-50% diffuse disease. The LAD after the first septal perforator has 40-50% stenosis. The vessel reaches the apex and wraps around the left ventricular apex without significant stenosis.  Left circumflex (LCx): The intermediate Jovontae Banko is small in caliber and diffusely diseased with 50-60% proximal vessel stenosis the AV groove circumflex is diffusely diseased with 50% ostial  stenosis and 30% mid stenosis it supplies 2 small posterolateral Kaileena Obi  Right coronary artery (RCA): The RCA is diffusely diseased. There is significant tortuosity, especially around the junction of the mid and distal vessel. The vessel is diffusely calcified. The mid vessel has tandem 50% stenoses. The distal vessel has 80-90 % stenosis just before the bifurcation of the PDA and posterior AV segment. The PDA and posterior AV segment with 3 posterolateral branches are patent.  Left ventriculography: Left ventricular systolic function is moderately depressed. There is severe hypokinesis of the basal and midinferior walls. The anterolateral and apical walls contract normally. The left ventricular ejection fraction is estimated at 45%.  Abdominal aortogram: The abdominal aorta is patent. The renal arteries are single bilaterally and they are widely patent. There appears to be nonobstructive stenosis of the right common iliac artery at the aortoiliac junction. The left common iliac artery appears to have significant ostial stenosis estimated at at least 75% angiographically. The visualized portions of the external iliac and common femoral arteries are patent  Final Conclusions:  1. Severe single-vessel coronary artery disease involving the distal right coronary artery 2. Moderate LV dysfunction with segmental severe hypokinesis of the inferior wall and normal contraction of the antero-apex with estimated left ventricular ejection fraction of 45%. 3. Severe left common iliac artery stenosis Recommendations: The patient has severe distal RCA disease. She has a known history of inferior wall infarction and remote PCA. This fits with that clinical history. She has nonobstructive disease of the left coronary artery. Her left ventricular ejection fraction by ventriculography is clearly greater than 35%. With her history of previous MI, I do not think PCI is indicated with an absence of angina. Will  carefully discussed symptoms with the patient, but I am inclined to treat her medically. Will also review indication for consideration of peripheral intervention for her iliac disease.   01/2013 Echo Study Conclusions  - Left ventricle: The cavity size was mildly to moderately dilated. Wall thickness was normal. Systolic function was severely reduced. The estimated ejection fraction was 25%. Severe diffuse hypokinesis. - Aortic valve: Mildly calcified annulus. Trileaflet. - Mitral valve: Calcified annulus. - Left atrium: The atrium was moderately dilated. - Right ventricle: The cavity size was normal. Wall thickness was mildly to moderately increased. - Right atrium: The atrium was mildly dilated. - Atrial septum: No defect or patent foramen ovale was identified. Impressions:  - Compared to the prior study of 03/13/11, there has been substantial deterioration in LV systolic function.  07/2014 Echo Study Conclusions  - Left ventricle: The cavity size was severely dilated. Wall  thickness was normal. The estimated ejection fraction was 20%. Diffuse hypokinesis. There is akinesis of the inferolateral and inferior myocardium. Features are consistent with a pseudonormal left ventricular filling pattern, with concomitant abnormal relaxation and increased filling pressure (grade 2 diastolic dysfunction). Doppler parameters are consistent with high ventricular filling pressure. - Mitral valve: There was mild to moderate regurgitation. - Left atrium: The atrium was moderately dilated.  Impressions:  - Global hypokinesis; inferior and inferolateral akinesis; overall severely reduced LV function; grade 2 diastolic dysfunction with elevated left ventricular filling pressures, moderate LAE; mild to moderate MR. 07/2014 Cath PROCEDURAL FINDINGS  Hemodynamics:  AO 122/48  LV 118/14  RA 9  RV 41/11  PA 37/5 with a mean of 21  PCWP 27  Oxygen saturations  AO 90  PA  56  Cardiac output 3.5  Cardiac index 2.0  Coronary angiography:  Coronary dominance: right  Left mainstem: The left mainstem is patent with 30% distal left main stenosis.  Left anterior descending (LAD): The LAD is diffusely diseased in the midportion. There are sequential 50% stenoses, unchanged from previous study. The first diagonal Rahsaan Weakland is large in distribution with diffuse nonobstructive disease noted. The mid and distal LAD are patent without significant disease.  Left circumflex (LCx): The left circumflex has 50% ostial stenosis, 50% mid vessel stenosis, and to OM branches with no significant disease. There is an intermediate Edmonia Gonser is diffusely diseased with up to 70% stenosis. This vessel is small in caliber.  Right coronary artery (RCA): Dominant vessel. Tortuous with diffuse nonobstructive stenosis throughout the proximal and mid portions. There are diffuse 50% stenosis present. The distal vessel has an ulcerated-appearing irregular 90% stenosis involving the bifurcation of the PDA and posterolateral branches. The posterolateral branches are patent with mild diffuse disease. The PDA arises an acute angulation with 30-40% stenosis. The mid body of the PDA has an 80-90% stenosis in an area where the vessel is too small for PCI.  Left ventriculography: The ventricle appears the synchronous. The basal and midinferior wall are akinetic. The basal inferolateral wall is severely hypokinetic. The anterolateral wall and apex contract normally. The estimated LVEF is 30%.  PCI Procedure Note: Following the diagnostic procedure, the decision was made to proceed with PCI of the distal RCA. The lesion is complex, there is a large amount of myocardium involved. I felt there was a risk of potentially compromising the PDA Ladarryl Wrage, but overall in my opinion the risk/benefit was favorable for PCI considering the severe stenosis involving the entire RCA distribution. The sheath was upsized to a 6  [place]. Weight-based bivalirudin was given for anticoagulation. The patient was loaded with Plavix 600 mg on the table. Once a therapeutic ACT was achieved, a 6 [place] JR 4 guide catheter was inserted. A cougar coronary guidewire was used to cross the lesion into the PLA Johniya Durfee. The lesion was predilated with a 2.5 x 15 mm balloon. The lesion was then stented with a 3.0 by 39m Promus DES stent. The stent was postdilated with a 3.25 mm noncompliant balloon to 18 atmospheres. The PDA was stented across as the stent extended from the distal RCA proximal PLA Larya Charpentier. The PDA was totally occluded. I made a long attempt with a whisper wire using multiple bands on the tip of the wire, but all of these attempts were unsuccessful at rewiring the PDA. The patient was completely asymptomatic and specifically denied any chest pain. Her hemodynamics remained stable and there were no changes in her rhythm or blood  pressure. A final image of the left coronary artery was obtained and this demonstrated a collateral from the LAD to the PDA Shaune Westfall. Following PCI, there was 0% residual stenosis and TIMI-3 flow. Final angiography confirmed an excellent result. Femoral hemostasis was achieved will be achieved with manual hemostasis. The patient tolerated the PCI procedure well. There were no immediate procedural complications. The patient was transferred to the post catheterization recovery area for further monitoring.  PCI Data:  Vessel - RCA/Segment - distal  Percent Stenosis (pre) 90  TIMI-flow 3  Stent 3.0 by 53m Promus DES  Percent Stenosis (post) 0  TIMI-flow (post) 3  Contrast: 175 cc Omnipaque  Radiation dose/Fluoro time: 15.5 minutes  Estimated Blood Loss: Minimal  Final Conclusions:  1. Nonobstructive disease involving the LAD, left circumflex, and OM/diagonal side branches 2. Severe stenosis of the distal RCA bifurcation, treated successfully with a drug-eluting stent into the PLA Laural Eiland with  residual occlusion of the PDA Myles Mallicoat 3. Severe segmental LV systolic dysfunction Recommendations: Dual antiplatelet therapy with aspirin and Plavix for at least 12 months. Will cycle cardiac enzymes since the PDA Tierrah Anastos is occluded after stenting.  10/2014 Echo Study Conclusions  - Procedure narrative: Transthoracic echocardiography. Image quality was suboptimal. The study was technically difficult, as a result of poor sound wave transmission and body habitus. - Left ventricle: Systolic function is severely reduced, estimated EF 15-20%. The cavity size was severely dilated. Doppler parameters are consistent with abnormal left ventricular relaxation (grade 1 diastolic dysfunction). Doppler parameters are consistent with high ventricular filling pressure. Medial E/e&' 24. - Regional wall motion abnormality: Akinesis of the basal-mid inferior and basal inferolateral myocardium; severe hypokinesis of the apical inferior and mid inferolateral myocardium. Severe, diffuse hypokinesis. - Ventricular septum: Septal motion showed abnormal function and dyssynergy. These changes are consistent with intraventricular conduction delay. - Aortic valve: Mildly calcified annulus. Trileaflet; mildly thickened leaflets. - Mitral valve: Mildly dilated annulus. Mildly thickened leaflets . There was moderate eccentric regurgitation. 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 - Left atrium: The atrium was severely dilated. Volume/bsa, S: 47.1 ml/m^2. - Right ventricle: Systolic function was mildly reduced. - Right atrium: The atrium was mildly dilated. - Atrial septum: There was increased thickness of the septum, consistent with lipomatous hypertrophy. - Tricuspid valve: There was mild regurgitation. - Pulmonary arteries: PA peak pressure: 42 mm Hg (S). Mildly elevated pulmonary pressures. - Pericardium, extracardiac: There was a small  pericardial effusion, with no evidence of tamponade physiology.  04/2015 Carotid [place]: 1. Mild (1-49%) stenosis of the proximal right internal carotid artery secondary to smooth heterogeneous atherosclerotic plaque. No interval change compared to recent prior imaging from earlier this month. 2. Mild (1-49%) stenosis of the proximal left internal carotid artery secondary to heterogeneous and irregular atherosclerotic plaque. No interval change compared to recent prior imaging from earlier this month. 3. Vertebral arteries remain patent with normal antegrade flow. Signed,    10/2015 echo Study Conclusions  - Left ventricle: The cavity size was moderately to severely dilated. Wall thickness was normal. Systolic function was severely reduced. The estimated ejection fraction was in the range of 25% to 30%. Diffuse hypokinesis. There is akinesis of the basalanteroseptal and anterior myocardium. Doppler parameters are consistent with abnormal left ventricular relaxation (grade 1 diastolic dysfunction). - Mitral valve: Calcified annulus. Mildly calcified leaflets . There was mild regurgitation. - Right atrium: Central venous pressure (est): 3 mm Hg. - Atrial septum: No defect or patent foramen ovale was identified. - Tricuspid  valve: There was trivial regurgitation. - Pulmonary arteries: PA peak pressure: 27 mm Hg (S). - Pericardium, extracardiac: A prominent pericardial fat pad was present.  Impressions:  - Moderate to severe LV chamber dilatation with LVEF approximately 25-30%. There is diffuse hypokinesis with near akinesis of the basal anteroseptal and anterior myocardium. Compared to the previous study from June 2016 there has been some improvement in LVEF. Grade 1 diastolic dysfunction. MAC with mild mitral regurgitation. Trivial tricuspid regurgitation with PASP 27 mmHg.  Transthoracic echocardiography. M-mode, complete 2D,  spectral Doppler, and color Doppler. Birthdate: Patient birthdate: [birth year]. Age: Patient is 77 yr old. Sex: Gender: female. BMI: 27.5 kg/m^2. Blood pressure:   98/47 Patient status: Inpatient. Study date: Study date: 10/04/2015. Study time: 11:27 AM. Location: Echo laboratory.         Assessment and Plan  1. CAD -doing well without symptoms, continue current meds  2. Chronic systolic HF - euvolemic, no symptoms. Medical therpay limited by soft bp's and renal insufficiency - working to limit diuretic best we can due to renal function, lower lasix to 55m daily may take additional 260mas needed for swelling.  .    3. Hyperlipidemia -statin changed previously due to cost, - lipids at goal, continue statin.   4. CKD III - limit nephrotoxic meds  EKG today shows AVpacing.    F/u 4 months   JoArnoldo LenisM.D.

## 2018-09-17 NOTE — Patient Instructions (Signed)
Medication Instructions:  Take lasix 20 mg daily. May take an extra 20 mg daily as needed for swelling.   Labwork: I will request labs from pcp  Testing/Procedures: none  Follow-Up: Your physician recommends that you schedule a follow-up appointment in: 4 months    Any Other Special Instructions Will Be Listed Below (If Applicable).     If you need a refill on your cardiac medications before your next appointment, please call your pharmacy.

## 2018-09-22 DIAGNOSIS — E871 Hypo-osmolality and hyponatremia: Secondary | ICD-10-CM | POA: Diagnosis not present

## 2018-09-23 NOTE — Progress Notes (Signed)
No ICM remote transmission received for 09/13/2018 and next ICM transmission scheduled for 10/07/2018.

## 2018-09-27 ENCOUNTER — Other Ambulatory Visit: Payer: Self-pay

## 2018-09-27 MED ORDER — POTASSIUM CHLORIDE CRYS ER 20 MEQ PO TBCR: 20.0000 meq | EXTENDED_RELEASE_TABLET | Freq: Every day | ORAL | 2 refills | Status: DC

## 2018-10-07 ENCOUNTER — Ambulatory Visit (INDEPENDENT_AMBULATORY_CARE_PROVIDER_SITE_OTHER): Payer: Medicare Other

## 2018-10-07 DIAGNOSIS — I5022 Chronic systolic (congestive) heart failure: Secondary | ICD-10-CM | POA: Diagnosis not present

## 2018-10-07 DIAGNOSIS — I255 Ischemic cardiomyopathy: Secondary | ICD-10-CM | POA: Diagnosis not present

## 2018-10-07 DIAGNOSIS — Z95810 Presence of automatic (implantable) cardiac defibrillator: Secondary | ICD-10-CM | POA: Diagnosis not present

## 2018-10-07 NOTE — Progress Notes (Signed)
EPIC Encounter for ICM Monitoring  Patient Name: Nicole Clements is a 77 y.o. female Date: 10/07/2018 Primary Care Physican: Mayra Neer, MD Primary Cardiologist:Branch Electrophysiologist: Vergie Living Pacing:91.6%    Last Weight: 149lbs Today's Weight: 149 lbs   VT-NS (>4 beats, >200 bpm) 2       Heart Failure questions reviewed, pt asymptomatic.   Thoracic impedance has improved normal but continues to be abnormal suggesting fluid accumulation since 08/19/2018.   Prescribed: Furosemide 20 mgtake2 tablets (40 mgtotal)alternating with 1 tablet (20 mgtotal)daily.Furosemide dosage decreasedon 03/17/2018 by Dr Harl Bowie due to elevated Creatinine.  Lab 03/12/2018 Creatinine 2.10, BUN 12, Potassium 5.3, Sodium 123 02/25/2018 Creatinine 2.22, BUN 16, Potassium 5.2, Sodium 121 09/11/2017 Creatinine 1.19, BUN 10, Potassium 4.6, Sodium 136, EGFR 44-51 08/10/2017 Creatinine 1.33, BUN 9, Potassium 5.5, Sodium 139, EGFR 39-47 02/09/2017 Creatinine 1.22, BUN 11, Potassium 4.2, Sodium 138  Recommendations: No changes.  Reinforced limiting salt intake to < 2000 mg daily.  Encouraged to call for fluid symptoms.  Follow-up plan: ICM clinic phone appointment on 11/08/2018.   Was due to schedule an appt with Dr Caryl Comes 05/25/2018, last EP visit was with Dillon Bjork, PA 05/25/2017.  Copy of ICM check sent to Dr. Caryl Comes and Dr Harl Bowie.   3 month ICM trend: 10/07/2018    1 Year ICM trend:       Rosalene Billings, RN 10/07/2018 11:53 AM

## 2018-10-07 NOTE — Progress Notes (Signed)
Remote ICD transmission.   

## 2018-10-11 DIAGNOSIS — N179 Acute kidney failure, unspecified: Secondary | ICD-10-CM | POA: Diagnosis not present

## 2018-10-12 ENCOUNTER — Other Ambulatory Visit: Payer: Self-pay | Admitting: Cardiology

## 2018-10-13 ENCOUNTER — Encounter: Payer: Self-pay | Admitting: Cardiology

## 2018-11-08 ENCOUNTER — Ambulatory Visit (INDEPENDENT_AMBULATORY_CARE_PROVIDER_SITE_OTHER): Payer: Medicare Other

## 2018-11-08 DIAGNOSIS — I5022 Chronic systolic (congestive) heart failure: Secondary | ICD-10-CM | POA: Diagnosis not present

## 2018-11-08 DIAGNOSIS — Z95810 Presence of automatic (implantable) cardiac defibrillator: Secondary | ICD-10-CM | POA: Diagnosis not present

## 2018-11-08 NOTE — Progress Notes (Signed)
EPIC Encounter for ICM Monitoring  Patient Name: Nicole Clements is a 78 y.o. female Date: 11/08/2018 Primary Care Physican: Mayra Neer, MD Primary Cardiologist:Branch Electrophysiologist: Vergie Living Pacing:94% Last Weight: 149lbs Today's Weight: 149 lbs                                                  Heart Failure questions reviewed, pt asymptomatic.   Thoracic impedance remains abnormal suggesting fluid accumulation since 08/19/2018.   Prescribed: Furosemide 20 mgtake2 tablets (40 mgtotal)alternating with 1 tablet (20 mgtotal)daily.Furosemide dosage decreasedon 03/17/2018 by Dr Harl Bowie due to elevated Creatinine.  Lab 03/12/2018 Creatinine 2.10, BUN 12, Potassium 5.3, Sodium 123 02/25/2018 Creatinine 2.22, BUN 16, Potassium 5.2, Sodium 121 09/11/2017 Creatinine 1.19, BUN 10, Potassium 4.6, Sodium 136, EGFR 44-51 08/10/2017 Creatinine 1.33, BUN 9, Potassium 5.5, Sodium 139, EGFR 39-47 02/09/2017 Creatinine 1.22, BUN 11, Potassium 4.2, Sodium 138  Recommendations: No changes.  Reinforced limiting salt intake to < 2000 mg daily.  Encouraged to call for fluid symptoms.  Follow-up plan: ICM clinic phone appointment on 12/02/2018 to recheck fluid levels.   Advised she is overdue to make an appt with Dr Caryl Comes 05/25/2018, last EP visit was with Dillon Bjork, PA 05/25/2017.  She agreed to make an appt and will have scheduler call her.   Copy of ICM check sent to Dr. Caryl Comes and Dr Harl Bowie.   3 month ICM trend: 11/08/2018    1 Year ICM trend:       Rosalene Billings, RN 11/08/2018 4:08 PM

## 2018-11-15 ENCOUNTER — Other Ambulatory Visit: Payer: Self-pay | Admitting: Cardiology

## 2018-11-22 LAB — CUP PACEART REMOTE DEVICE CHECK
Battery Voltage: 2.97 V
Brady Statistic AP VP Percent: 95.67 %
Brady Statistic AP VS Percent: 1.44 %
Brady Statistic AS VP Percent: 2.84 %
Brady Statistic AS VS Percent: 0.04 %
Brady Statistic RA Percent Paced: 91.57 %
Date Time Interrogation Session: 20191205093824
HIGH POWER IMPEDANCE MEASURED VALUE: 59 Ohm
Implantable Lead Implant Date: 20160615
Implantable Lead Location: 753859
Implantable Lead Location: 753860
Implantable Lead Model: 5076
Implantable Pulse Generator Implant Date: 20160615
Lead Channel Impedance Value: 361 Ohm
Lead Channel Impedance Value: 361 Ohm
Lead Channel Impedance Value: 361 Ohm
Lead Channel Impedance Value: 456 Ohm
Lead Channel Impedance Value: 513 Ohm
Lead Channel Impedance Value: 608 Ohm
Lead Channel Impedance Value: 760 Ohm
Lead Channel Impedance Value: 779 Ohm
Lead Channel Impedance Value: 817 Ohm
Lead Channel Pacing Threshold Amplitude: 0.625 V
Lead Channel Pacing Threshold Amplitude: 0.625 V
Lead Channel Pacing Threshold Amplitude: 1.125 V
Lead Channel Pacing Threshold Pulse Width: 0.4 ms
Lead Channel Pacing Threshold Pulse Width: 0.4 ms
Lead Channel Pacing Threshold Pulse Width: 0.4 ms
Lead Channel Sensing Intrinsic Amplitude: 1.375 mV
Lead Channel Setting Pacing Amplitude: 1.5 V
Lead Channel Setting Pacing Amplitude: 2 V
Lead Channel Setting Sensing Sensitivity: 0.45 mV
MDC IDC LEAD IMPLANT DT: 20160615
MDC IDC LEAD IMPLANT DT: 20160615
MDC IDC LEAD LOCATION: 753858
MDC IDC MSMT BATTERY REMAINING LONGEVITY: 43 mo
MDC IDC MSMT LEADCHNL LV IMPEDANCE VALUE: 456 Ohm
MDC IDC MSMT LEADCHNL LV IMPEDANCE VALUE: 532 Ohm
MDC IDC MSMT LEADCHNL LV IMPEDANCE VALUE: 551 Ohm
MDC IDC MSMT LEADCHNL RA SENSING INTR AMPL: 1.375 mV
MDC IDC MSMT LEADCHNL RV IMPEDANCE VALUE: 589 Ohm
MDC IDC MSMT LEADCHNL RV SENSING INTR AMPL: 14 mV
MDC IDC MSMT LEADCHNL RV SENSING INTR AMPL: 14 mV
MDC IDC SET LEADCHNL LV PACING AMPLITUDE: 2.25 V
MDC IDC SET LEADCHNL LV PACING PULSEWIDTH: 0.4 ms
MDC IDC SET LEADCHNL RV PACING PULSEWIDTH: 0.4 ms
MDC IDC STAT BRADY RV PERCENT PACED: 29.01 %

## 2018-12-02 ENCOUNTER — Ambulatory Visit (INDEPENDENT_AMBULATORY_CARE_PROVIDER_SITE_OTHER): Payer: Medicare Other

## 2018-12-02 ENCOUNTER — Telehealth: Payer: Self-pay

## 2018-12-02 DIAGNOSIS — Z95810 Presence of automatic (implantable) cardiac defibrillator: Secondary | ICD-10-CM

## 2018-12-02 DIAGNOSIS — I5022 Chronic systolic (congestive) heart failure: Secondary | ICD-10-CM

## 2018-12-02 NOTE — Telephone Encounter (Signed)
Spoke with patient for monthly ICM follow up.  Advised of remote transmission results.  Patient reports she does not cook and is eating foods higher in salt than she realizes. She frequently eats at restaurants and advised restaurant foods are usually >2000 mg.  Recommended to limit salt to 2000 mg daily.    SYMPTOMS: Asymptomatic  OPTIVOL:  Thoracic impedance suggesting fluid accumulation since 08/19/2018 with 1 day at baseline. See cc'd ICM chart note for details.   PRESCRIBED: Furosemide 20 mg daily.    RECOMMENDATIONS:  Advised patient would send information to Dr Wyline Mood for review and if any recommendations will call back.    Recheck fluid levels and remote transmission scheduled 12/13/2018.  Please advise if any changes are recommended. Will call patient back if changes are advised.

## 2018-12-02 NOTE — Progress Notes (Signed)
EPIC Encounter for ICM Monitoring  Patient Name: Nicole Clements is a 78 y.o. female Date: 12/02/2018 Primary Care Physican: Mayra Neer, MD Primary Cardiologist:Branch Electrophysiologist: Vergie Living Pacing:89.5% Last Weight:149lbs Today's Weight:149lbs    Heart Failure questions reviewed, pt asymptomatic.  She is unsure if following low salt diet. She does not cook. Reviewed 1 day of foods she ate which included 2 pieces of bacon , 1 slice of breath for breakfast, bologna sandwich for lunch and picked up fast food for dinner such as taco salad and advised she consumed approximately 3000-3500 mg of salt. Encouraged her to reviewed food labels and limit salt to 2000 mg daily.    Thoracic impedanceremainsabnormalsuggesting fluid accumulation since 08/19/2018.   Prescribed:Furosemide 20 mgtake 1 tabletdaily.You may take an extra 20 mg daily as needed for swelling.  Lab 08/20/2018 Creatinine 1.78, BUN 28, Potassium 5.2, Sodium 128, eGFR 28-34 03/12/2018 Creatinine 2.10, BUN 12, Potassium 5.3, Sodium 123 02/25/2018 Creatinine 2.22, BUN 16, Potassium 5.2, Sodium 121 09/11/2017 Creatinine 1.19, BUN 10, Potassium 4.6, Sodium 136, EGFR 44-51 08/10/2017 Creatinine 1.33, BUN 9, Potassium 5.5, Sodium 139, EGFR 39-47 02/09/2017 Creatinine 1.22, BUN 11, Potassium 4.2, Sodium 138  Recommendations:  Reinforced limiting salt intake to <2000 mg daily. Encouraged to call for fluid symptoms.  Follow-up plan: ICM clinic phone appointment on2/08/2019.Office visit with Chanetta Marshall, NP on 12/06/2018 and Dr Harl Bowie on 01/10/2019.   Copy of ICM check sent to [place]. Phone note sent to Dr Harl Bowie for review and recommendations if needed.   3 month ICM trend: 12/02/2018    1 Year ICM trend:       Rosalene Billings, RN 12/02/2018 12:10 PM

## 2018-12-03 NOTE — Telephone Encounter (Signed)
I believe she is on lasix 20mg  daily with an additional 20mg  as needed for SOB or edema. If has been taking 20mg  daily then would take her extra 20mg  x 3 days then resume prior dosing   Dominga Ferry MD

## 2018-12-03 NOTE — Telephone Encounter (Signed)
Attempted call to patient to provide Dr Verna Czech recommendation.  Left message for return call.

## 2018-12-03 NOTE — Progress Notes (Signed)
Antoine Poche, MD  Physician  Cardiology  Telephone Encounter  Signed  Creation Time:  12/03/2018 1:59 PM          Signed        I believe she is on lasix 20mg  daily with an additional 20mg  as needed for SOB or edema. If has been taking 20mg  daily then would take her extra 20mg  x 3 days then resume prior dosing   Dominga Ferry MD

## 2018-12-03 NOTE — Progress Notes (Signed)
Patient returned call.  Advised of Dr Branch's recommendations to take Furosemide 2 tablets (40 mg total) x 3 days and after 3rd day return to 1 tablet daily. She verbalized understanding. Will recheck fluid levels 12/13/2018. 

## 2018-12-03 NOTE — Telephone Encounter (Signed)
Patient returned call.  Advised of Dr Verna Czech recommendations to take Furosemide 2 tablets (40 mg total) x 3 days and after 3rd day return to 1 tablet daily. She verbalized understanding. Will recheck fluid levels 12/13/2018.

## 2018-12-05 NOTE — Progress Notes (Signed)
Electrophysiology Office Note Date: 12/06/2018  ID:  Nicole Clements, DOB 1941-06-20, MRN [id number]  PCP: Lupita RaiderShaw, Kimberlee, MD Primary Cardiologist: Wyline MoodBranch Electrophysiologist: Graciela HusbandsKlein  CC: Routine ICD follow-up  Nicole Clements is a 78 y.o. female seen today for Dr Graciela HusbandsKlein.  She presents today for routine electrophysiology followup.  Since last being seen in our clinic, the patient reports doing very well. She denies chest pain, palpitations, dyspnea, PND, orthopnea, nausea, vomiting, dizziness, syncope, edema, weight gain, or early satiety.  She has not had ICD shocks.   Device History: MDT CRTD implanted 2016 for ICM History of appropriate therapy: No History of AAD therapy: No   Past Medical History:  Diagnosis Date  . AICD (automatic cardioverter/defibrillator) present   . CAD (coronary artery disease) April 2014   DES to PLA, occluded PDA 07/2014  . CHF (congestive heart failure) (HCC)   . Hypercholesterolemia   . Hypertension   . Ischemic cardiomyopathy    LVEF 25%-45%  . IVCD (intraventricular conduction defect)   . PVC's (premature ventricular contractions)   . S/P colonoscopy August 2007   Hyperplastic polyps, rare sigmoid and descending colon diverticulosis, small internal hemorrhoids  . STEMI (ST elevation myocardial infarction) (HCC) 07/22/1996   Past Surgical History:  Procedure Laterality Date  . ABDOMINAL HYSTERECTOMY    . CARDIAC CATHETERIZATION  April 2014   Med Rx  . CATARACT EXTRACTION W/PHACO Right 09/15/2017   Procedure: CATARACT EXTRACTION PHACO AND INTRAOCULAR LENS PLACEMENT (IOC);  Surgeon: Jethro BolusShapiro, Mark, MD;  Location: [facility];  Service: Ophthalmology;  Laterality: Right;  CDE: 12.22  . CATARACT EXTRACTION W/PHACO Left 09/29/2017   Procedure: CATARACT EXTRACTION PHACO AND INTRAOCULAR LENS PLACEMENT (IOC);  Surgeon: Jethro BolusShapiro, Mark, MD;  Location: [facility];  Service: Ophthalmology;  Laterality: Left;  CDE: 10.02  . COLONOSCOPY N/A 12/24/2015   Dr.  Fields:moderate sized internal hemorrhoids/moderate diverticulosis, negative microscopic colitis   . EP IMPLANTABLE DEVICE  04/18/2015   BV ICD  . EP IMPLANTABLE DEVICE N/A 04/18/2015   Procedure: BiV ICD Insertion CRT-D;  Surgeon: Duke SalviaSteven C Klein, MD;  [facility];  Service: Cardiovascular;  Laterality: N/A;  . LEFT AND RIGHT HEART CATHETERIZATION WITH CORONARY ANGIOGRAM N/A 07/17/2014   Procedure: LEFT AND RIGHT HEART CATHETERIZATION WITH CORONARY ANGIOGRAM;  Surgeon: Micheline ChapmanMichael D Cooper, MD;  [facility];  Service: Cardiovascular;  Laterality: N/A;  . PERCUTANEOUS CORONARY STENT INTERVENTION (PCI-S)  07/17/2014   Procedure: PERCUTANEOUS CORONARY STENT INTERVENTION (PCI-S);  Surgeon: Micheline ChapmanMichael D Cooper, MD;  [facility];  Service: Cardiovascular;;  Distal RCA  . S/P Hysterectomy      Current Outpatient Medications  Medication Sig Dispense Refill  . acetaminophen (TYLENOL) 500 MG tablet Take 1,000 mg by mouth every 8 (eight) hours as needed for mild pain or headache.    [name]. aspirin EC 81 MG tablet Take 1 tablet (81 mg total) by mouth daily. 90 tablet 3  . carvedilol (COREG) 25 MG tablet TAKE 1 TABLET BY MOUTH TWICE DAILY 60 tablet 6  . Cholecalciferol (VITAMIN D3) 2000 UNITS capsule Take 2,000 Units by mouth daily.      . furosemide (LASIX) 20 MG tablet Take 20 mg daily. You may take an extra 20 mg daily as needed for swelling. 180 tablet 3  . nitroGLYCERIN (NITROSTAT) 0.4 MG SL tablet Place 1 tablet (0.4 mg total) under the tongue every 5 (five) minutes x 3 doses as needed for chest pain. 25 tablet 2  .  Omega-3 Fatty Acids (FISH OIL) 1200 MG CAPS Take 1,200 mg by mouth daily.    . potassium chloride SA (K-DUR,KLOR-CON) 20 MEQ tablet Take 1 tablet (20 mEq total) by mouth daily. 90 tablet 2  . pravastatin (PRAVACHOL) 80 MG tablet Take 80 mg by mouth daily.    [name] spironolactone (ALDACTONE) 25 MG tablet Take 0.5 tablets (12.5 mg total) by mouth 2 (two) times daily. 90  tablet 3  . losartan (COZAAR) 25 MG tablet Take 1 tablet (25 mg total) by mouth daily. 90 tablet 1   No current facility-administered medications for this visit.     Allergies:   Lactose intolerance (gi) and Sulfa antibiotics   Social History: Social History   Socioeconomic History  . Marital status: Single    Spouse name: Not on file  . Number of children: 1  . Years of education: Not on file  . Highest education level: Not on file  Occupational History  . Occupation: Retired    Comment: Clinical biochemist rep  Social Needs  . Financial resource strain: Not on file  . Food insecurity:    Worry: Not on file    Inability: Not on file  . Transportation needs:    Medical: Not on file    Non-medical: Not on file  Tobacco Use  . Smoking status: Current Every Day Smoker    Packs/day: 0.50    Years: 50.00    Pack years: 25.00    Types: Cigarettes    Start date: 02/26/1962  . Smokeless tobacco: Never Used  . Tobacco comment: down to less than 1/2 ppd  Substance and Sexual Activity  . Alcohol use: No    Alcohol/week: 0.0 standard drinks  . Drug use: No  . Sexual activity: Never    Birth control/protection: None  Lifestyle  . Physical activity:    Days per week: Not on file    Minutes per session: Not on file  . Stress: Not on file  Relationships  . Social connections:    Talks on phone: Not on file    Gets together: Not on file    Attends religious service: Not on file    Active member of club or organization: Not on file    Attends meetings of clubs or organizations: Not on file    Relationship status: Not on file  . Intimate partner violence:    Fear of current or ex partner: Not on file    Emotionally abused: Not on file    Physically abused: Not on file    Forced sexual activity: Not on file  Other Topics Concern  . Not on file  Social History Narrative  . Not on file    Family History: Family History  Problem Relation Age of Onset  . Hypertension Sister    . Diabetes Mellitus II Sister   . Hypertension Brother   . Diabetes Mellitus II Brother   . Hypertension Brother   . Diabetes Mellitus II Brother   . Hypertension Brother   . Diabetes Mellitus II Brother   . Colon cancer Neg Hx     Review of Systems: All other systems reviewed and are otherwise negative except as noted above.   Physical Exam: VS:  BP 116/66   Pulse 82   Ht 5\' 2"  (1.575 m)   Wt 144 lb (65.3 kg)   BMI 26.34 kg/m  , BMI Body mass index is 26.34 kg/m.  GEN- The patient is well appearing, alert and oriented  x 3 today.   HEENT: normocephalic, atraumatic; sclera clear, conjunctiva pink; hearing intact; oropharynx clear; neck supple  Lungs- normal work of breathing, scattered rhonchi Heart- Regular rate and rhythm (paced) GI- soft, non-tender, non-distended, bowel sounds present, no hepatosplenomegaly Extremities- no clubbing, cyanosis, or edema; DP/PT/radial pulses 2+ bilaterally MS- no significant deformity or atrophy Skin- warm and dry, no rash or lesion; ICD pocket well healed Psych- euthymic mood, full affect Neuro- strength and sensation are intact  ICD interrogation- reviewed in detail today,  See PACEART report  EKG:  EKG is not ordered today.  Recent Labs: 03/12/2018: BUN 26; Creat 2.10; Magnesium 1.7; Potassium 5.3; Sodium 123   Wt Readings from Last 3 Encounters:  12/06/18 144 lb (65.3 kg)  09/17/18 142 lb (64.4 kg)  02/25/18 152 lb (68.9 kg)     Other studies Reviewed: Additional studies/ records that were reviewed today include: Dr Wyline Mood and Dr Odessa Fleming office notes   Assessment and Plan:  1.  Chronic systolic dysfunction euvolemic today Stable on an appropriate medical regimen Normal ICD function See Pace Art report No changes today  2.  CAD  No recent ischemic symptoms Continue current therapy  3.  HTN Stable No change required today   Current medicines are reviewed at length with the patient today.   The patient does not  have concerns regarding her medicines.  The following changes were made today:  none  Labs/ tests ordered today include: none No orders of the defined types were placed in this encounter.    Disposition:   Follow up with [facility], Dr Wyline Mood as scheduled, Dr Graciela Husbands 1 year   Signed, Gypsy Balsam, NP 12/06/2018 10:49 AM  [facility] [address] [place] [address] [phone] (office) [phone] (fax)

## 2018-12-06 ENCOUNTER — Ambulatory Visit (INDEPENDENT_AMBULATORY_CARE_PROVIDER_SITE_OTHER): Payer: Medicare Other | Admitting: Nurse Practitioner

## 2018-12-06 ENCOUNTER — Other Ambulatory Visit: Payer: Self-pay | Admitting: Cardiology

## 2018-12-06 VITALS — BP 116/66 | HR 82 | Ht 62.0 in | Wt 144.0 lb

## 2018-12-06 DIAGNOSIS — I5022 Chronic systolic (congestive) heart failure: Secondary | ICD-10-CM | POA: Diagnosis not present

## 2018-12-06 DIAGNOSIS — I255 Ischemic cardiomyopathy: Secondary | ICD-10-CM | POA: Diagnosis not present

## 2018-12-06 DIAGNOSIS — I10 Essential (primary) hypertension: Secondary | ICD-10-CM

## 2018-12-06 LAB — CUP PACEART INCLINIC DEVICE CHECK
Date Time Interrogation Session: 20200203105109
Implantable Lead Implant Date: 20160615
Implantable Lead Location: 753858
Implantable Lead Model: 5076
Implantable Pulse Generator Implant Date: 20160615
MDC IDC LEAD IMPLANT DT: 20160615
MDC IDC LEAD IMPLANT DT: 20160615
MDC IDC LEAD LOCATION: 753859
MDC IDC LEAD LOCATION: 753860

## 2018-12-06 NOTE — Patient Instructions (Signed)
Medication Instructions:  none If you need a refill on your cardiac medications before your next appointment, please call your pharmacy.   Lab work: none If you have labs (blood work) drawn today and your tests are completely normal, you will receive your results only by: [name] MyChart Message (if you have MyChart) OR . A paper copy in the mail If you have any lab test that is abnormal or we need to change your treatment, we will call you to review the results.  Testing/Procedures: none  Follow-Up: At [facility], you and your health needs are our priority.  As part of our continuing mission to provide you with exceptional heart care, we have created designated Provider Care Teams.  These Care Teams include your primary Cardiologist (physician) and Advanced Practice Providers (APPs -  Physician Assistants and Nurse Practitioners) who all work together to provide you with the care you need, when you need it. You will need a follow up appointment in 1 years.  Please call our office 2 months in advance to schedule this appointment.  You may see Dr Graciela Husbands or one of the following Advanced Practice Providers on your designated Care Team:   Gypsy Balsam, NP . Francis Dowse, PA-C  Any Other Special Instructions Will Be Listed Below (If Applicable). Remote monitoring is used to monitor your  ICD from home. This monitoring reduces the number of office visits required to check your device to one time per year. It allows [place] to keep an eye on the functioning of your device to ensure it is working properly. You are scheduled for a device check from home on 03/07/2019. You may send your transmission at any time that day. If you have a wireless device, the transmission will be sent automatically. After your physician reviews your transmission, you will receive a postcard with your next transmission date.

## 2018-12-13 ENCOUNTER — Ambulatory Visit (INDEPENDENT_AMBULATORY_CARE_PROVIDER_SITE_OTHER): Payer: Medicare Other

## 2018-12-13 DIAGNOSIS — I5022 Chronic systolic (congestive) heart failure: Secondary | ICD-10-CM

## 2018-12-13 DIAGNOSIS — Z95810 Presence of automatic (implantable) cardiac defibrillator: Secondary | ICD-10-CM

## 2018-12-14 NOTE — Progress Notes (Signed)
EPIC Encounter for ICM Monitoring  Patient Name: Nicole Clements is a 78 y.o. female Date: 12/14/2018 Primary Care Physican: Mayra Neer, MD Primary Cardiologist:Branch Electrophysiologist: Vergie Living Pacing:93.8% Last Weight:149lbs Today's Weight:146 lbs    Heart Failure questions reviewed, pt asymptomatic.      Thoracic impedancereturned to baseline 12/04/2018 after being abnormal since October 2019.  Prescribed:Furosemide 20 mgtake 1 tabletdaily.You may take an extra 20 mg daily as needed for swelling.  Lab 08/20/2018 Creatinine 1.78, BUN 28, Potassium 5.2, Sodium 128, eGFR 28-34 03/12/2018 Creatinine 2.10, BUN 12, Potassium 5.3, Sodium 123 02/25/2018 Creatinine 2.22, BUN 16, Potassium 5.2, Sodium 121 09/11/2017 Creatinine 1.19, BUN 10, Potassium 4.6, Sodium 136, EGFR 44-51 08/10/2017 Creatinine 1.33, BUN 9, Potassium 5.5, Sodium 139, EGFR 39-47 02/09/2017 Creatinine 1.22, BUN 11, Potassium 4.2, Sodium 138  Recommendations: No changes.   Encouraged to call for fluid symptoms.  Follow-up plan: ICM clinic phone appointment on 01/10/2019.   Office appt 01/10/2019 with Dr. Harl Bowie.    Copy of ICM check sent to Dr. Caryl Comes.   3 month ICM trend: 12/13/2018    1 Year ICM trend:       Rosalene Billings, RN 12/14/2018 10:15 AM

## 2019-01-07 ENCOUNTER — Ambulatory Visit: Payer: Medicare Other

## 2019-01-10 ENCOUNTER — Telehealth: Payer: Self-pay

## 2019-01-10 ENCOUNTER — Ambulatory Visit (INDEPENDENT_AMBULATORY_CARE_PROVIDER_SITE_OTHER): Payer: Medicare Other | Admitting: Cardiology

## 2019-01-10 ENCOUNTER — Ambulatory Visit (INDEPENDENT_AMBULATORY_CARE_PROVIDER_SITE_OTHER): Payer: Medicare Other

## 2019-01-10 ENCOUNTER — Encounter: Payer: Self-pay | Admitting: Cardiology

## 2019-01-10 VITALS — BP 108/54 | HR 77 | Ht 62.0 in | Wt 145.2 lb

## 2019-01-10 DIAGNOSIS — I5022 Chronic systolic (congestive) heart failure: Secondary | ICD-10-CM

## 2019-01-10 DIAGNOSIS — I255 Ischemic cardiomyopathy: Secondary | ICD-10-CM

## 2019-01-10 DIAGNOSIS — Z95810 Presence of automatic (implantable) cardiac defibrillator: Secondary | ICD-10-CM | POA: Diagnosis not present

## 2019-01-10 DIAGNOSIS — I2510 Atherosclerotic heart disease of native coronary artery without angina pectoris: Secondary | ICD-10-CM

## 2019-01-10 DIAGNOSIS — N183 Chronic kidney disease, stage 3 unspecified: Secondary | ICD-10-CM

## 2019-01-10 DIAGNOSIS — E782 Mixed hyperlipidemia: Secondary | ICD-10-CM | POA: Diagnosis not present

## 2019-01-10 NOTE — Patient Instructions (Signed)
Medication Instructions:  Your physician recommends that you continue on your current medications as directed. Please refer to the Current Medication list given to you today.  If you need a refill on your cardiac medications before your next appointment, please call your pharmacy.   Lab work: None today If you have labs (blood work) drawn today and your tests are completely normal, you will receive your results only by: [name] MyChart Message (if you have MyChart) OR . A paper copy in the mail If you have any lab test that is abnormal or we need to change your treatment, we will call you to review the results.  Testing/Procedures: None today  Follow-Up: At [facility], you and your health needs are our priority.  As part of our continuing mission to provide you with exceptional heart care, we have created designated Provider Care Teams.  These Care Teams include your primary Cardiologist (physician) and Advanced Practice Providers (APPs -  Physician Assistants and Nurse Practitioners) who all work together to provide you with the care you need, when you need it. You will need a follow up appointment in 4 months.  Please call our office 2 months in advance to schedule this appointment.  You may see Dina Rich, MD or one of the following Advanced Practice Providers on your designated Care Team:   Randall An, PA-C [facility]) . Jacolyn Reedy, PA-C [facility])  Any Other Special Instructions Will Be Listed Below (If Applicable). None

## 2019-01-10 NOTE — Progress Notes (Signed)
Clinical Summary Ms. Stanwood is a 78 y.o.female seen today for follow up of the following medical problems.   1. 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 Admit 07/2014 with CHF and troponin elevation - cath 07/2014, LM 30% distal, LAD mid 50%, LCX ostial 50% and mid 50%, small intermediate Delmar Dondero 70%. RCA diffuse 50%, distal RCA with ulcerated 90% lesion, PDA 80-90% too small for PCI. LVEF 30%. Received DES to distal RCA, post procedure the PDA became occluded.  - echo 07/2014 LVEF 20%, inferior akinesis, grade II diastolic dysfunction. Repeat echo 10/2014 LVEF remains 15-20%.  - 04/2015 CRT-D device placed by Dr Caryl Comes. Normal function Jan 2018.  10/2015 echo LVEF 25-30% -could not afford entresto, back on losartan.    - we lowered lasix to 36m alternating with 245mdue to ongoing renal dysfunction. Cr down from 2.1 to 1.79 by 08/2018 labs.   - no SOb/DOE. No recent edema. Home weights stable 145 lbs - compliant with meds - 12/2018 device check normal.   2. Mitral regurgitation - mild by most recent echo -no symptoms  3. Hyperlipidemia - atorva was too expensive, changed to pravastatinby pcp - 08/2018 TC 114 HDL 35 TG 79 LDL 63 - remains compliant with meds   4. CKD III - labs followed by pcp. Working to limit nephrotoxic drugs    Past Medical History:  Diagnosis Date  . AICD (automatic cardioverter/defibrillator) present   . CAD (coronary artery disease) April 2014   DES to PLA, occluded PDA 07/2014  . CHF (congestive heart failure) ([facility]  . Hypercholesterolemia   . Hypertension   . Ischemic cardiomyopathy    LVEF 25%-45%  . IVCD (intraventricular conduction defect)   . PVC's (premature ventricular contractions)   . S/P colonoscopy August 2007   Hyperplastic polyps, rare sigmoid and descending colon diverticulosis, small internal hemorrhoids  . [facility] (ST elevation myocardial infarction) [place]     Allergies  Allergen  Reactions  . Lactose Intolerance (Gi) Other (See Comments)    GI Upset   . Sulfa Antibiotics Rash     Current Outpatient Medications  Medication Sig Dispense Refill  . acetaminophen (TYLENOL) 500 MG tablet Take 1,000 mg by mouth every 8 (eight) hours as needed for mild pain or headache.    . Marland Kitchenspirin EC 81 MG tablet Take 1 tablet (81 mg total) by mouth daily. 90 tablet 3  . carvedilol (COREG) 25 MG tablet TAKE 1 TABLET BY MOUTH TWICE DAILY 60 tablet 6  . Cholecalciferol (VITAMIN D3) 2000 UNITS capsule Take 2,000 Units by mouth daily.      . furosemide (LASIX) 20 MG tablet Take 20 mg daily. You may take an extra 20 mg daily as needed for swelling. 180 tablet 3  . losartan (COZAAR) 25 MG tablet Take 1 tablet (25 mg total) by mouth daily. 90 tablet 1  . nitroGLYCERIN (NITROSTAT) 0.4 MG SL tablet Place 1 tablet (0.4 mg total) under the tongue every 5 (five) minutes x 3 doses as needed for chest pain. 25 tablet 2  . Omega-3 Fatty Acids (FISH OIL) 1200 MG CAPS Take 1,200 mg by mouth daily.    . potassium chloride SA (K-DUR,KLOR-CON) 20 MEQ tablet Take 1 tablet (20 mEq total) by mouth daily. 90 tablet 2  . pravastatin (PRAVACHOL) 80 MG tablet Take 80 mg by mouth daily.    . Marland Kitchenpironolactone (ALDACTONE) 25 MG tablet TAKE 1/2 TABLET(12.5 MG) BY MOUTH TWICE DAILY 90 tablet 3  No current facility-administered medications for this visit.      Past Surgical History:  Procedure Laterality Date  . ABDOMINAL HYSTERECTOMY    . CARDIAC CATHETERIZATION  April 2014   Med Rx  . CATARACT EXTRACTION W/PHACO Right 09/15/2017   Procedure: CATARACT EXTRACTION PHACO AND INTRAOCULAR LENS PLACEMENT (IOC);  Surgeon: Rutherford Guys, MD;  Location: [facility];  Service: Ophthalmology;  Laterality: Right;  CDE: 12.22  . CATARACT EXTRACTION W/PHACO Left 09/29/2017   Procedure: CATARACT EXTRACTION PHACO AND INTRAOCULAR LENS PLACEMENT (IOC);  Surgeon: Rutherford Guys, MD;  Location: [facility];  Service: Ophthalmology;  Laterality:  Left;  CDE: 10.02  . COLONOSCOPY N/A 12/24/2015   Dr. Fields:moderate sized internal hemorrhoids/moderate diverticulosis, negative microscopic colitis   . EP IMPLANTABLE DEVICE  04/18/2015   BV ICD  . EP IMPLANTABLE DEVICE N/A 04/18/2015   Procedure: BiV ICD Insertion CRT-D;  Surgeon: Deboraha Sprang, MD;  Location[facility];  Service: Cardiovascular;  Laterality: N/A;  . LEFT AND RIGHT HEART CATHETERIZATION WITH CORONARY ANGIOGRAM N/A 07/17/2014   Procedure: LEFT AND RIGHT HEART CATHETERIZATION WITH CORONARY ANGIOGRAM;  Surgeon: Blane Ohara, MD;  Location[facility] C[facility] LAB;  Service: Cardiovascular;  Laterality: N/A;  . PERCUTANEOUS CORONARY STENT INTERVENTION (PCI-S)  07/17/2014   Procedure: PERCUTANEOUS CORONARY STENT INTERVENTION (PCI-S);  Surgeon: Blane Ohara, MD;  Location: [facility] CATH LAB;  Service: Cardiovascular;;  Distal RCA  . S/P Hysterectomy       Allergies  Allergen Reactions  . Lactose Intolerance (Gi) Other (See Comments)    GI Upset   . Sulfa Antibiotics Rash      Family History  Problem Relation Age of Onset  . Hypertension Sister   . Diabetes Mellitus II Sister   . Hypertension Brother   . Diabetes Mellitus II Brother   . Hypertension Brother   . Diabetes Mellitus II Brother   . Hypertension Brother   . Diabetes Mellitus II Brother   . Colon cancer Neg Hx      Social History Ms. Cowles reports that she has been smoking cigarettes. She started smoking about 56 years ago. She has a 25.00 pack-year smoking history. She has never used smokeless tobacco. Ms. Reeser reports no history of alcohol use.   Review of Systems CONSTITUTIONAL: No weight loss, fever, chills, weakness or fatigue.  HEENT: Eyes: No visual loss, blurred vision, double vision or yellow sclerae.No hearing loss, sneezing, congestion, runny nose or sore throat.  SKIN: No rash or itching.  CARDIOVASCULAR: per hpi RESPIRATORY: No shortness of breath, cough or sputum.    GASTROINTESTINAL: No anorexia, nausea, vomiting or diarrhea. No abdominal pain or blood.  GENITOURINARY: No burning on urination, no polyuria NEUROLOGICAL: No headache, dizziness, syncope, paralysis, ataxia, numbness or tingling in the extremities. No change in bowel or bladder control.  MUSCULOSKELETAL: No muscle, back pain, joint pain or stiffness.  LYMPHATICS: No enlarged nodes. No history of splenectomy.  PSYCHIATRIC: No history of depression or anxiety.  ENDOCRINOLOGIC: No reports of sweating, cold or heat intolerance. No polyuria or polydipsia.  [name]   Physical Examination Vitals:   01/10/19 1120  BP: (!) 108/54  Pulse: 77  SpO2: 96%   Vitals:   01/10/19 1120  Weight: 145 lb 3.2 oz (65.9 kg)  Height: _0  (1.575 m)    Gen: resting comfortably, no acute distress HEENT: no scleral icterus, pupils equal round and reactive, no palptable cervical adenopathy,  CV: RRR, no m/r/g no jvd Resp: Clear to auscultation  bilaterally GI: abdomen is soft, non-tender, non-distended, normal bowel sounds, no hepatosplenomegaly MSK: extremities are warm, no edema.  Skin: warm, no rash Neuro:  no focal deficits Psych: appropriate affect   Diagnostic Studies 02/2013 [facility] Procedural Findings:  Hemodynamics:  AO 116/45 with a mean of 70  LV 120/18  Coronary angiography:  Coronary dominance: right  Left mainstem: The left main is patent with 30% distal left main stenosis as the vessel trifurcates into the LAD, intermediate Katura Eatherly, and left circumflex.  Left anterior descending (LAD): The LAD is patent with diffuse disease noted. There is minimal calcification present. The first diagonal is patent with 30-50% diffuse disease. The LAD after the first septal perforator has 40-50% stenosis. The vessel reaches the apex and wraps around the left ventricular apex without significant stenosis.  Left circumflex (LCx): The intermediate Revel Stellmach is small in caliber and diffusely diseased with  50-60% proximal vessel stenosis the AV groove circumflex is diffusely diseased with 50% ostial stenosis and 30% mid stenosis it supplies 2 small posterolateral Anniebelle Devore  Right coronary artery (RCA): The RCA is diffusely diseased. There is significant tortuosity, especially around the junction of the mid and distal vessel. The vessel is diffusely calcified. The mid vessel has tandem 50% stenoses. The distal vessel has 80-90 % stenosis just before the bifurcation of the PDA and posterior AV segment. The PDA and posterior AV segment with 3 posterolateral branches are patent.  Left ventriculography: Left ventricular systolic function is moderately depressed. There is severe hypokinesis of the basal and midinferior walls. The anterolateral and apical walls contract normally. The left ventricular ejection fraction is estimated at 45%.  Abdominal aortogram: The abdominal aorta is patent. The renal arteries are single bilaterally and they are widely patent. There appears to be nonobstructive stenosis of the right common iliac artery at the aortoiliac junction. The left common iliac artery appears to have significant ostial stenosis estimated at at least 75% angiographically. The visualized portions of the external iliac and common femoral arteries are patent  Final Conclusions:  1. Severe single-vessel coronary artery disease involving the distal right coronary artery 2. Moderate LV dysfunction with segmental severe hypokinesis of the inferior wall and normal contraction of the antero-apex with estimated left ventricular ejection fraction of 45%. 3. Severe left common iliac artery stenosis Recommendations: The patient has severe distal RCA disease. She has a known history of inferior wall infarction and remote PCA. This fits with that clinical history. She has nonobstructive disease of the left coronary artery. Her left ventricular ejection fraction by ventriculography is clearly greater than 35%. With her  history of previous MI, I do not think PCI is indicated with an absence of angina. Will carefully discussed symptoms with the patient, but I am inclined to treat her medically. Will also review indication for consideration of peripheral intervention for her iliac disease.   01/2013 Echo Study Conclusions  - Left ventricle: The cavity size was mildly to moderately dilated. Wall thickness was normal. Systolic function was severely reduced. The estimated ejection fraction was 25%. Severe diffuse hypokinesis. - Aortic valve: Mildly calcified annulus. Trileaflet. - Mitral valve: Calcified annulus. - Left atrium: The atrium was moderately dilated. - Right ventricle: The cavity size was normal. Wall thickness was mildly to moderately increased. - Right atrium: The atrium was mildly dilated. - Atrial septum: No defect or patent foramen ovale was identified. Impressions:  - Compared to the prior study of 03/13/11, there has been substantial deterioration in LV systolic function.  07/2014 Echo  Study Conclusions  - Left ventricle: The cavity size was severely dilated. Wall thickness was normal. The estimated ejection fraction was 20%. Diffuse hypokinesis. There is akinesis of the inferolateral and inferior myocardium. Features are consistent with a pseudonormal left ventricular filling pattern, with concomitant abnormal relaxation and increased filling pressure (grade 2 diastolic dysfunction). Doppler parameters are consistent with high ventricular filling pressure. - Mitral valve: There was mild to moderate regurgitation. - Left atrium: The atrium was moderately dilated.  Impressions:  - Global hypokinesis; inferior and inferolateral akinesis; overall severely reduced LV function; grade 2 diastolic dysfunction with elevated left ventricular filling pressures, moderate LAE; mild to moderate MR. 07/2014 Cath PROCEDURAL FINDINGS  Hemodynamics:  AO 122/48  LV 118/14  RA 9   RV 41/11  PA 37/5 with a mean of 21  PCWP 27  Oxygen saturations  AO 90  PA 56  Cardiac output 3.5  Cardiac index 2.0  Coronary angiography:  Coronary dominance: right  Left mainstem: The left mainstem is patent with 30% distal left main stenosis.  Left anterior descending (LAD): The LAD is diffusely diseased in the midportion. There are sequential 50% stenoses, unchanged from previous study. The first diagonal Eryanna Regal is large in distribution with diffuse nonobstructive disease noted. The mid and distal LAD are patent without significant disease.  Left circumflex (LCx): The left circumflex has 50% ostial stenosis, 50% mid vessel stenosis, and to OM branches with no significant disease. There is an intermediate Katianna Mcclenney is diffusely diseased with up to 70% stenosis. This vessel is small in caliber.  Right coronary artery (RCA): Dominant vessel. Tortuous with diffuse nonobstructive stenosis throughout the proximal and mid portions. There are diffuse 50% stenosis present. The distal vessel has an ulcerated-appearing irregular 90% stenosis involving the bifurcation of the PDA and posterolateral branches. The posterolateral branches are patent with mild diffuse disease. The PDA arises an acute angulation with 30-40% stenosis. The mid body of the PDA has an 80-90% stenosis in an area where the vessel is too small for PCI.  Left ventriculography: The ventricle appears the synchronous. The basal and midinferior wall are akinetic. The basal inferolateral wall is severely hypokinetic. The anterolateral wall and apex contract normally. The estimated LVEF is 30%.  PCI Procedure Note: Following the diagnostic procedure, the decision was made to proceed with PCI of the distal RCA. The lesion is complex, there is a large amount of myocardium involved. I felt there was a risk of potentially compromising the PDA Richanda Darin, but overall in my opinion the risk/benefit was favorable for PCI considering the  severe stenosis involving the entire RCA distribution. The sheath was upsized to a 6 [place]. Weight-based bivalirudin was given for anticoagulation. The patient was loaded with Plavix 600 mg on the table. Once a therapeutic ACT was achieved, a 6 [place] JR 4 guide catheter was inserted. A cougar coronary guidewire was used to cross the lesion into the PLA Trena Dunavan. The lesion was predilated with a 2.5 x 15 mm balloon. The lesion was then stented with a 3.0 by 58m Promus DES stent. The stent was postdilated with a 3.25 mm noncompliant balloon to 18 atmospheres. The PDA was stented across as the stent extended from the distal RCA proximal PLA Zhana Jeangilles. The PDA was totally occluded. I made a long attempt with a whisper wire using multiple bands on the tip of the wire, but all of these attempts were unsuccessful at rewiring the PDA. The patient was completely asymptomatic and specifically denied any chest pain. Her  hemodynamics remained stable and there were no changes in her rhythm or blood pressure. A final image of the left coronary artery was obtained and this demonstrated a collateral from the LAD to the PDA Douglass Dunshee. Following PCI, there was 0% residual stenosis and TIMI-3 flow. Final angiography confirmed an excellent result. Femoral hemostasis was achieved will be achieved with manual hemostasis. The patient tolerated the PCI procedure well. There were no immediate procedural complications. The patient was transferred to the post catheterization recovery area for further monitoring.  PCI Data:  Vessel - RCA/Segment - distal  Percent Stenosis (pre) 90  TIMI-flow 3  Stent 3.0 by 38m Promus DES  Percent Stenosis (post) 0  TIMI-flow (post) 3  Contrast: 175 cc Omnipaque  Radiation dose/Fluoro time: 15.5 minutes  Estimated Blood Loss: Minimal  Final Conclusions:  1. Nonobstructive disease involving the LAD, left circumflex, and OM/diagonal side branches 2. Severe stenosis of the distal RCA  bifurcation, treated successfully with a drug-eluting stent into the PLA Mariana Goytia with residual occlusion of the PDA Kyrie Fludd 3. Severe segmental LV systolic dysfunction Recommendations: Dual antiplatelet therapy with aspirin and Plavix for at least 12 months. Will cycle cardiac enzymes since the PDA Reno Clasby is occluded after stenting.  10/2014 Echo Study Conclusions  - Procedure narrative: Transthoracic echocardiography. Image quality was suboptimal. The study was technically difficult, as a result of poor sound wave transmission and body habitus. - Left ventricle: Systolic function is severely reduced, estimated EF 15-20%. The cavity size was severely dilated. Doppler parameters are consistent with abnormal left ventricular relaxation (grade 1 diastolic dysfunction). Doppler parameters are consistent with high ventricular filling pressure. Medial E/e&' 24. - Regional wall motion abnormality: Akinesis of the basal-mid inferior and basal inferolateral myocardium; severe hypokinesis of the apical inferior and mid inferolateral myocardium. Severe, diffuse hypokinesis. - Ventricular septum: Septal motion showed abnormal function and dyssynergy. These changes are consistent with intraventricular conduction delay. - Aortic valve: Mildly calcified annulus. Trileaflet; mildly thickened leaflets. - Mitral valve: Mildly dilated annulus. Mildly thickened leaflets . There was moderate eccentric regurgitation. 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 - Left atrium: The atrium was severely dilated. Volume/bsa, S: 47.1 ml/m^2. - Right ventricle: Systolic function was mildly reduced. - Right atrium: The atrium was mildly dilated. - Atrial septum: There was increased thickness of the septum, consistent with lipomatous hypertrophy. - Tricuspid valve: There was mild regurgitation. - Pulmonary arteries: PA peak pressure: 42 mm Hg (S). Mildly elevated  pulmonary pressures. - Pericardium, extracardiac: There was a small pericardial effusion, with no evidence of tamponade physiology.  04/2015 Carotid [place]PRESS[place]: 1. Mild (1-49%) stenosis of the proximal right internal carotid artery secondary to smooth heterogeneous atherosclerotic plaque. No interval change compared to recent prior imaging from earlier this month. 2. Mild (1-49%) stenosis of the proximal left internal carotid artery secondary to heterogeneous and irregular atherosclerotic plaque. No interval change compared to recent prior imaging from earlier this month. 3. Vertebral arteries remain patent with normal antegrade flow. Signed,    10/2015 echo Study Conclusions  - Left ventricle: The cavity size was moderately to severely dilated. Wall thickness was normal. Systolic function was severely reduced. The estimated ejection fraction was in the range of 25% to 30%. Diffuse hypokinesis. There is akinesis of the basalanteroseptal and anterior myocardium. Doppler parameters are consistent with abnormal left ventricular relaxation (grade 1 diastolic dysfunction). - Mitral valve: Calcified annulus. Mildly calcified leaflets . There was mild regurgitation. - Right atrium: Central venous pressure (est): 3 mm Hg. -  Atrial septum: No defect or patent foramen ovale was identified. - Tricuspid valve: There was trivial regurgitation. - Pulmonary arteries: PA peak pressure: 27 mm Hg (S). - Pericardium, extracardiac: A prominent pericardial fat pad was present.  Impressions:  - Moderate to severe LV chamber dilatation with LVEF approximately 25-30%. There is diffuse hypokinesis with near akinesis of the basal anteroseptal and anterior myocardium. Compared to the previous study from June 2016 there has been some improvement in LVEF. Grade 1 diastolic dysfunction. MAC with mild mitral regurgitation. Trivial tricuspid regurgitation with PASP 27  mmHg.  Transthoracic echocardiography. M-mode, complete 2D, spectral Doppler, and color Doppler. Birthdate: Patient birthdate: 1941/03/07. Age: Patient is 78 yr old. Sex: Gender: female. BMI: 27.5 kg/m^2. Blood pressure:   98/47 Patient status: Inpatient. Study date: Study date: 10/04/2015. Study time: 11:27 AM. Location: Echo laboratory.         Assessment and Plan   1. CAD -denies any symptoms, continue current meds   2. Chronic systolic HF - Medical therpay limited by soft bp's and renal insufficiency - no symptoms, she is euvolemic. Continue current meds  .   3. Hyperlipidemia -statin changed previously due to cost, - continue current statin, request labs from pcp  4. CKD III - continue to limit diuretics, and nephrotoxic meds   F/u 4 months     Arnoldo Lenis, M.D.

## 2019-01-10 NOTE — Telephone Encounter (Signed)
Remote ICM transmission received.  Attempted call to patient regarding ICM remote transmission and no answer or answering machine. 

## 2019-01-10 NOTE — Progress Notes (Signed)
EPIC Encounter for ICM Monitoring  Patient Name: Nicole Clements is a 78 y.o. female Date: 01/10/2019 Primary Care Physican: Mayra Neer, MD Primary Cardiologist:Branch Electrophysiologist: Vergie Living Pacing:92.1% Last Weight:146lbs Today's Weight:145 lbs at 01/10/2019 office visit   Attempted call to patient and unable to reach.  Transmission reviewed.   Thoracic impedanceat baseline normal for the last couple of days.  Prescribed:Furosemide 20 mgtake1 tabletdaily.You may take an extra 20 mg daily as needed for swelling.  Lab 08/20/2018 Creatinine 1.78, BUN 28, Potassium 5.2, Sodium 128, eGFR 28-34 03/12/2018 Creatinine 2.10, BUN 12, Potassium 5.3, Sodium 123 02/25/2018 Creatinine 2.22, BUN 16, Potassium 5.2, Sodium 121 09/11/2017 Creatinine 1.19, BUN 10, Potassium 4.6, Sodium 136, EGFR 44-51 08/10/2017 Creatinine 1.33, BUN 9, Potassium 5.5, Sodium 139, EGFR 39-47 02/09/2017 Creatinine 1.22, BUN 11, Potassium 4.2, Sodium 138  Recommendations:  Unable to reach.  Follow-up plan: ICM clinic phone appointment on 02/14/2019.       Copy of ICM check sent to Dr. Caryl Comes and Dr Harl Bowie (patient has appt this morning, 01/10/2019).   3 month ICM trend: 01/10/2019    1 Year ICM trend:       Rosalene Billings, RN 01/10/2019 12:07 PM

## 2019-01-18 LAB — CUP PACEART REMOTE DEVICE CHECK
Battery Remaining Longevity: 38 mo
Battery Voltage: 2.96 V
Brady Statistic AP VS Percent: 1.49 %
Brady Statistic RA Percent Paced: 92.23 %
HIGH POWER IMPEDANCE MEASURED VALUE: 52 Ohm
Implantable Lead Implant Date: 20160615
Implantable Lead Location: 753859
Implantable Lead Location: 753860
Implantable Lead Model: 4398
Implantable Lead Model: 5076
Implantable Pulse Generator Implant Date: 20160615
Lead Channel Impedance Value: 399 Ohm
Lead Channel Impedance Value: 475 Ohm
Lead Channel Impedance Value: 475 Ohm
Lead Channel Impedance Value: 513 Ohm
Lead Channel Impedance Value: 608 Ohm
Lead Channel Impedance Value: 703 Ohm
Lead Channel Impedance Value: 817 Ohm
Lead Channel Pacing Threshold Pulse Width: 0.4 ms
Lead Channel Sensing Intrinsic Amplitude: 16.25 mV
Lead Channel Setting Pacing Amplitude: 2 V
Lead Channel Setting Pacing Amplitude: 2.25 V
Lead Channel Setting Pacing Pulse Width: 0.4 ms
Lead Channel Setting Pacing Pulse Width: 0.4 ms
Lead Channel Setting Sensing Sensitivity: 0.45 mV
MDC IDC LEAD IMPLANT DT: 20160615
MDC IDC LEAD IMPLANT DT: 20160615
MDC IDC LEAD LOCATION: 753858
MDC IDC MSMT LEADCHNL LV IMPEDANCE VALUE: 1007 Ohm
MDC IDC MSMT LEADCHNL LV IMPEDANCE VALUE: 361 Ohm
MDC IDC MSMT LEADCHNL LV IMPEDANCE VALUE: 361 Ohm
MDC IDC MSMT LEADCHNL LV IMPEDANCE VALUE: 589 Ohm
MDC IDC MSMT LEADCHNL LV IMPEDANCE VALUE: 836 Ohm
MDC IDC MSMT LEADCHNL LV PACING THRESHOLD AMPLITUDE: 1.25 V
MDC IDC MSMT LEADCHNL LV PACING THRESHOLD PULSEWIDTH: 0.4 ms
MDC IDC MSMT LEADCHNL RA IMPEDANCE VALUE: 399 Ohm
MDC IDC MSMT LEADCHNL RA PACING THRESHOLD AMPLITUDE: 0.625 V
MDC IDC MSMT LEADCHNL RA SENSING INTR AMPL: 1.875 mV
MDC IDC MSMT LEADCHNL RA SENSING INTR AMPL: 1.875 mV
MDC IDC MSMT LEADCHNL RV PACING THRESHOLD AMPLITUDE: 0.625 V
MDC IDC MSMT LEADCHNL RV PACING THRESHOLD PULSEWIDTH: 0.4 ms
MDC IDC MSMT LEADCHNL RV SENSING INTR AMPL: 16.25 mV
MDC IDC SESS DTM: 20200306062506
MDC IDC SET LEADCHNL RA PACING AMPLITUDE: 1.5 V
MDC IDC STAT BRADY AP VP PERCENT: 97.9 %
MDC IDC STAT BRADY AS VP PERCENT: 0.57 %
MDC IDC STAT BRADY AS VS PERCENT: 0.04 %
MDC IDC STAT BRADY RV PERCENT PACED: 35.95 %

## 2019-01-19 ENCOUNTER — Other Ambulatory Visit: Payer: Self-pay

## 2019-02-10 ENCOUNTER — Other Ambulatory Visit: Payer: Self-pay | Admitting: Cardiology

## 2019-02-14 ENCOUNTER — Ambulatory Visit (INDEPENDENT_AMBULATORY_CARE_PROVIDER_SITE_OTHER): Payer: Medicare Other

## 2019-02-14 ENCOUNTER — Other Ambulatory Visit: Payer: Self-pay

## 2019-02-14 DIAGNOSIS — Z95810 Presence of automatic (implantable) cardiac defibrillator: Secondary | ICD-10-CM

## 2019-02-14 DIAGNOSIS — I5022 Chronic systolic (congestive) heart failure: Secondary | ICD-10-CM | POA: Diagnosis not present

## 2019-02-15 NOTE — Progress Notes (Signed)
EPIC Encounter for ICM Monitoring  Patient Name: Nicole Clements is a 77 y.o. female Date: 02/15/2019 Primary Care Physican: Mayra Neer, MD Primary Cardiologist:Branch Electrophysiologist: Vergie Living Pacing:91.9% Last Weight:145 lbs at 01/10/2019 office visit   Attempted call to patient and unable to reach.  Transmission reviewed.   Thoracic impedancenormal.  Prescribed:Furosemide 20 mgtake1 tabletdaily.You may take an extra 20 mg daily as needed for swelling.  Lab 08/20/2018 Creatinine 1.78, BUN 28, Potassium 5.2, Sodium 128, eGFR 28-34 03/12/2018 Creatinine 2.10, BUN 12, Potassium 5.3, Sodium 123 02/25/2018 Creatinine 2.22, BUN 16, Potassium 5.2, Sodium 121 09/11/2017 Creatinine 1.19, BUN 10, Potassium 4.6, Sodium 136, EGFR 44-51 08/10/2017 Creatinine 1.33, BUN 9, Potassium 5.5, Sodium 139, EGFR 39-47 02/09/2017 Creatinine 1.22, BUN 11, Potassium 4.2, Sodium 138  Recommendations: Unable to reach.  Follow-up plan: ICM clinic phone appointment on5/18/2020.    Copy of ICM check sent to [facility].  3 month ICM trend: 02/14/2019    1 Year ICM trend:       Rosalene Billings, RN 02/15/2019 11:41 AM

## 2019-02-16 ENCOUNTER — Telehealth: Payer: Self-pay

## 2019-02-16 NOTE — Telephone Encounter (Signed)
Remote ICM transmission received.  Attempted call to patient regarding ICM remote transmission and left detailed message, per DPR, with next ICM remote transmission date of 03/21/2019.  Advised to return call for any fluid symptoms or questions.    

## 2019-03-21 ENCOUNTER — Ambulatory Visit (INDEPENDENT_AMBULATORY_CARE_PROVIDER_SITE_OTHER): Payer: Medicare Other

## 2019-03-21 ENCOUNTER — Other Ambulatory Visit: Payer: Self-pay

## 2019-03-21 DIAGNOSIS — I5022 Chronic systolic (congestive) heart failure: Secondary | ICD-10-CM | POA: Diagnosis not present

## 2019-03-21 DIAGNOSIS — Z95810 Presence of automatic (implantable) cardiac defibrillator: Secondary | ICD-10-CM

## 2019-03-24 NOTE — Progress Notes (Signed)
EPIC Encounter for ICM Monitoring  Patient Name: Nicole Clements is a 78 y.o. female Date: 03/24/2019 Primary Care Physican: Mayra Neer, MD Primary Cardiologist:Branch Electrophysiologist: Vergie Living Pacing:91.9% Last Weight:145 lbs at 01/10/2019 office visit   Transmission reviewed.   Optivol Thoracic impedancenormal.  Prescribed:Furosemide 20 mgtake1 tabletdaily.You may take an extra 20 mg daily as needed for swelling.  Lab 08/20/2018 Creatinine 1.78, BUN 28, Potassium 5.2, Sodium 128, eGFR 28-34 03/12/2018 Creatinine 2.10, BUN 12, Potassium 5.3, Sodium 123 02/25/2018 Creatinine 2.22, BUN 16, Potassium 5.2, Sodium 121 09/11/2017 Creatinine 1.19, BUN 10, Potassium 4.6, Sodium 136, EGFR 44-51 08/10/2017 Creatinine 1.33, BUN 9, Potassium 5.5, Sodium 139, EGFR 39-47 02/09/2017 Creatinine 1.22, BUN 11, Potassium 4.2, Sodium 138  Recommendation: None   Follow-up plan: ICM clinic phone appointment on 04/25/2019.   Copy of ICM check sent to [place].  3 month ICM trend: 03/21/2019    1 Year ICM trend:       Rosalene Billings, RN 03/24/2019 12:23 PM

## 2019-04-11 DIAGNOSIS — Z961 Presence of intraocular lens: Secondary | ICD-10-CM | POA: Diagnosis not present

## 2019-04-25 ENCOUNTER — Ambulatory Visit (INDEPENDENT_AMBULATORY_CARE_PROVIDER_SITE_OTHER): Payer: Medicare Other

## 2019-04-25 DIAGNOSIS — I5022 Chronic systolic (congestive) heart failure: Secondary | ICD-10-CM

## 2019-04-25 DIAGNOSIS — Z95810 Presence of automatic (implantable) cardiac defibrillator: Secondary | ICD-10-CM

## 2019-04-25 NOTE — Progress Notes (Signed)
EPIC Encounter for ICM Monitoring  Patient Name: Nicole Clements is a 78 y.o. female Date: 04/25/2019 Primary Care Physican: Mayra Neer, MD Primary Cardiologist:Branch Electrophysiologist: Vergie Living Pacing:88.5% 04/25/2019 Weight: 147 lbs   Transmission reviewed.  Pt does not always follow low salt diet.  She takes Furosemide every few days instead of daily.    Optivol thoracic impedanceabnormal suggesting possible fluid accumulation since 04/04/2019 and is ongoing.   Prescribed:Furosemide 20 mgtake1 tabletdaily.You may take an extra 20 mg daily as needed for swelling.  Lab 08/20/2018 Creatinine 1.78, BUN 28, Potassium 5.2, Sodium 128, GFR 28-34 03/12/2018 Creatinine 2.10, BUN 12, Potassium 5.3, Sodium 123 02/25/2018 Creatinine 2.22, BUN 16, Potassium 5.2, Sodium 121 09/11/2017 Creatinine 1.19, BUN 10, Potassium 4.6, Sodium 136, GFR 44-51 08/10/2017 Creatinine 1.33, BUN 9, Potassium 5.5, Sodium 139, GFR 39-47 02/09/2017 Creatinine 1.22, BUN 11, Potassium 4.2, Sodium 138  Recommendations:Advised to follow low salt diet and take Furosemide 20 mg a day as prescribed.  Follow-up plan: ICM clinic phone appointment on7/04/2019 to recheck fluid levels.   Copy of ICM check sent to Dr.Klein and Dr Harl Bowie.  3 month ICM trend: 04/25/2019    1 Year ICM trend:       Rosalene Billings, RN 04/25/2019 11:35 AM

## 2019-05-09 ENCOUNTER — Ambulatory Visit (INDEPENDENT_AMBULATORY_CARE_PROVIDER_SITE_OTHER): Payer: Medicare Other

## 2019-05-09 DIAGNOSIS — Z95810 Presence of automatic (implantable) cardiac defibrillator: Secondary | ICD-10-CM

## 2019-05-09 DIAGNOSIS — I5022 Chronic systolic (congestive) heart failure: Secondary | ICD-10-CM

## 2019-05-10 NOTE — Progress Notes (Signed)
EPIC Encounter for ICM Monitoring  Patient Name: Nicole Clements is a 78 y.o. female Date: 05/10/2019 Primary Care Physican: Mayra Neer, MD Primary Cardiologist:Branch Electrophysiologist: Vergie Living Pacing:91.2% 04/25/2019 Weight: 147 lbs 05/10/2019 Weight: 145 lbs    Transmission reviewed.  She reports she is doing well and denies any fluid symptoms today.     Optivol thoracic impedancereturned to normal after taking Furosemide daily as prescribed.   Prescribed:Furosemide 20 mgtake1 tabletdaily.You may take an extra 20 mg daily as needed for swelling.  Lab 08/20/2018 Creatinine 1.78, BUN 28, Potassium 5.2, Sodium 128, GFR 28-34 03/12/2018 Creatinine 2.10, BUN 12, Potassium 5.3, Sodium 123 02/25/2018 Creatinine 2.22, BUN 16, Potassium 5.2, Sodium 121 09/11/2017 Creatinine 1.19, BUN 10, Potassium 4.6, Sodium 136, GFR 44-51 08/10/2017 Creatinine 1.33, BUN 9, Potassium 5.5, Sodium 139, GFR 39-47 02/09/2017 Creatinine 1.22, BUN 11, Potassium 4.2, Sodium 138  Recommendations: No changes and encouraged to call if experiencing any fluid symptoms.  Follow-up plan: ICM clinic phone appointment on8/01/2019.  Copy of ICM check sent to [place].  3 month ICM trend: 05/09/2019    1 Year ICM trend:       Rosalene Billings, RN 05/10/2019 5:10 PM

## 2019-06-06 ENCOUNTER — Ambulatory Visit (INDEPENDENT_AMBULATORY_CARE_PROVIDER_SITE_OTHER): Payer: Medicare Other

## 2019-06-06 DIAGNOSIS — Z95810 Presence of automatic (implantable) cardiac defibrillator: Secondary | ICD-10-CM | POA: Diagnosis not present

## 2019-06-06 DIAGNOSIS — I5022 Chronic systolic (congestive) heart failure: Secondary | ICD-10-CM | POA: Diagnosis not present

## 2019-06-07 NOTE — Progress Notes (Signed)
EPIC Encounter for ICM Monitoring  Patient Name: Nicole Clements is a 78 y.o. female Date: 06/07/2019 Primary Care Physican: Mayra Neer, MD Primary Cardiologist:Branch Electrophysiologist: Vergie Living Pacing:92.5% 04/25/2019 Weight: 147 lbs 05/10/2019 Weight: 145 lbs       Transmission reviewed.  Optivol thoracic impedancenormal.   Prescribed:Furosemide 20 mgtake1 tabletdaily.You may take an extra 20 mg daily as needed for swelling.  Lab 08/20/2018 Creatinine 1.78, BUN 28, Potassium 5.2, Sodium 128, GFR 28-34 03/12/2018 Creatinine 2.10, BUN 12, Potassium 5.3, Sodium 123 02/25/2018 Creatinine 2.22, BUN 16, Potassium 5.2, Sodium 121 09/11/2017 Creatinine 1.19, BUN 10, Potassium 4.6, Sodium 136, GFR 44-51 08/10/2017 Creatinine 1.33, BUN 9, Potassium 5.5, Sodium 139, GFR 39-47 02/09/2017 Creatinine 1.22, BUN 11, Potassium 4.2, Sodium 138  Recommendations: No changes.  Follow-up plan: ICM clinic phone appointment on9/15/2020.  Copy of ICM check sent to [place].   3 month ICM trend: 06/06/2019    1 Year ICM trend:       Rosalene Billings, RN 06/07/2019 2:58 PM

## 2019-07-05 ENCOUNTER — Other Ambulatory Visit: Payer: Self-pay | Admitting: Cardiology

## 2019-07-14 ENCOUNTER — Telehealth: Payer: Self-pay | Admitting: Cardiology

## 2019-07-14 NOTE — Telephone Encounter (Signed)
Virtual Visit Pre-Appointment Phone Call  "(Name), I am calling you today to discuss your upcoming appointment. We are currently trying to limit exposure to the virus that causes COVID-19 by seeing patients at home rather than in the office."  1. "What is the BEST phone number to call the day of the visit?" - include this in appointment notes  2. Do you have or have access to (through a family member/friend) a smartphone with video capability that we can use for your visit?" a. If yes - list this number in appt notes as cell (if different from BEST phone #) and list the appointment type as a VIDEO visit in appointment notes b. If no - list the appointment type as a PHONE visit in appointment notes  3. Confirm consent - "In the setting of the current Covid19 crisis, you are scheduled for a (phone or video) visit with your provider on (date) at (time).  Just as we do with many in-office visits, in order for you to participate in this visit, we must obtain consent.  If you'd like, I can send this to your mychart (if signed up) or email for you to review.  Otherwise, I can obtain your verbal consent now.  All virtual visits are billed to your insurance company just like a normal visit would be.  By agreeing to a virtual visit, we'd like you to understand that the technology does not allow for your provider to perform an examination, and thus may limit your provider's ability to fully assess your condition. If your provider identifies any concerns that need to be evaluated in person, we will make arrangements to do so.  Finally, though the technology is pretty good, we cannot assure that it will always work on either your or our end, and in the setting of a video visit, we may have to convert it to a phone-only visit.  In either situation, we cannot ensure that we have a secure connection.  Are you willing to proceed?" STAFF: Did the patient verbally acknowledge consent to telehealth visit? Document  YES/NO here: Yes  4. Advise patient to be prepared - "Two hours prior to your appointment, go ahead and check your blood pressure, pulse, oxygen saturation, and your weight (if you have the equipment to check those) and write them all down. When your visit starts, your provider will ask you for this information. If you have an Apple Watch or Kardia device, please plan to have heart rate information ready on the day of your appointment. Please have a pen and paper handy nearby the day of the visit as well."  5. Give patient instructions for MyChart download to smartphone OR [email or web address] as below if video visit (depending on what platform provider is using)  6. Inform patient they will receive a phone call 15 minutes prior to their appointment time (may be from unknown caller ID) so they should be prepared to answer    TELEPHONE CALL NOTE  Nicole Clements has been deemed a candidate for a follow-up tele-health visit to limit community exposure during the Covid-19 pandemic. I spoke with the patient via phone to ensure availability of phone/video source, confirm preferred email & phone number, and discuss instructions and expectations.  I reminded Nicole Clements to be prepared with any vital sign and/or heart rhythm information that could potentially be obtained via home monitoring, at the time of her visit. I reminded Nicole Clements to expect a phone call prior to  her visit.  Terry L Goins 07/14/2019 11:20 AM

## 2019-07-19 ENCOUNTER — Ambulatory Visit (INDEPENDENT_AMBULATORY_CARE_PROVIDER_SITE_OTHER): Payer: Medicare Other

## 2019-07-19 DIAGNOSIS — I5022 Chronic systolic (congestive) heart failure: Secondary | ICD-10-CM

## 2019-07-19 DIAGNOSIS — Z95810 Presence of automatic (implantable) cardiac defibrillator: Secondary | ICD-10-CM

## 2019-07-19 NOTE — Progress Notes (Signed)
EPIC Encounter for ICM Monitoring  Patient Name: Nicole Clements is a 78 y.o. female Date: 07/19/2019 Primary Care Physican: Mayra Neer, MD Primary Cardiologist:Branch Electrophysiologist: Vergie Living Pacing:87.6% 07/19/2019 Weight: 143 lbs       Spoke with patient and is doing well.   Optivol thoracic impedancenormal.  Prescribed:Furosemide 20 mgtake1 tabletdaily.You may take an extra 20 mg daily as needed for swelling.  Lab 08/20/2018 Creatinine 1.78, BUN 28, Potassium 5.2, Sodium 128, GFR 28-34  Recommendations: No changes and encouraged to call if experiencing any fluid symptoms.  Follow-up plan: ICM clinic phone appointment on 08/29/2019.   Office appt 07/20/2019 with Dr. Harl Bowie.    Copy of ICM check sent to Dr. Caryl Comes.   3 month ICM trend: 07/19/2019    1 Year ICM trend:       Rosalene Billings, RN 07/19/2019 7:58 AM

## 2019-07-20 ENCOUNTER — Other Ambulatory Visit: Payer: Self-pay

## 2019-07-20 ENCOUNTER — Telehealth (INDEPENDENT_AMBULATORY_CARE_PROVIDER_SITE_OTHER): Payer: Medicare Other | Admitting: Cardiology

## 2019-07-20 ENCOUNTER — Encounter: Payer: Self-pay | Admitting: Cardiology

## 2019-07-20 VITALS — Ht 62.0 in | Wt 142.0 lb

## 2019-07-20 DIAGNOSIS — I2510 Atherosclerotic heart disease of native coronary artery without angina pectoris: Secondary | ICD-10-CM | POA: Diagnosis not present

## 2019-07-20 DIAGNOSIS — I5022 Chronic systolic (congestive) heart failure: Secondary | ICD-10-CM

## 2019-07-20 DIAGNOSIS — E782 Mixed hyperlipidemia: Secondary | ICD-10-CM

## 2019-07-20 DIAGNOSIS — N183 Chronic kidney disease, stage 3 unspecified: Secondary | ICD-10-CM

## 2019-07-20 NOTE — Progress Notes (Signed)
Virtual Visit via Telephone Note   This visit type was conducted due to national recommendations for restrictions regarding the COVID-19 Pandemic (e.g. social distancing) in an effort to limit this patient's exposure and mitigate transmission in our community.  Due to her co-morbid illnesses, this patient is at least at moderate risk for complications without adequate follow up.  This format is felt to be most appropriate for this patient at this time.  The patient did not have access to video technology/had technical difficulties with video requiring transitioning to audio format only (telephone).  All issues noted in this document were discussed and addressed.  No physical exam could be performed with this format.  Please refer to the patient's chart for her  consent to telehealth for [facility]   Date:  07/20/2019   ID:  Nicole Clements, DOB 1941/08/27, MRN [id number]  Patient Location: Home Provider Location: Home  PCP:  Mayra Neer, MD  Cardiologist:  Carlyle Dolly, MD  Electrophysiologist:  None   Evaluation Performed:  Follow-Up Visit  Chief Complaint:  Follow up  History of Present Illness:    Nicole Clements is a 78 y.o. female seen today for follow up of the following medical problems.   1. 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 Admit 07/2014 with CHF and troponin elevation - cath 07/2014, LM 30% distal, LAD mid 50%, LCX ostial 50% and mid 50%, small intermediate Roniqua Kintz 70%. RCA diffuse 50%, distal RCA with ulcerated 90% lesion, PDA 80-90% too small for PCI. LVEF 30%. Received DES to distal RCA, post procedure the PDA became occluded.  - echo 07/2014 LVEF 20%, inferior akinesis, grade II diastolic dysfunction. Repeat echo 10/2014 LVEF remains 15-20%.  - 04/2015 CRT-D device placed by Dr Caryl Comes. Normal function Jan 2018.  10/2015 echo LVEF 25-30% -could not afford entresto, back on losartan.    - we previously lowered lasix to 9m alternating  with 236mdue to ongoing renal dysfunction. Cr down from 2.1 to 1.79 by 08/2018 labs.    - no recent SOB or DOE. No LE edema - compliant with meds. Weights are stable around 142 lbs.    2. Mitral regurgitation - mild by most recent echo -no recent symptoms  3. Hyperlipidemia - atorva was too expensive, changed to pravastatinby pcp - 08/2018 TC 114 HDL 35 TG 79 LDL 63 - upcoming labs with pcp   4. CKD III - Working to limit nephrotoxic drugs - upcoming labs with pcp   The patient does not have symptoms concerning for COVID-19 infection (fever, chills, cough, or new shortness of breath).    Past Medical History:  Diagnosis Date   AICD (automatic cardioverter/defibrillator) present    CAD (coronary artery disease) April 2014   DES to PLA, occluded PDA 07/2014   CHF (congestive heart failure) (HCC)    Hypercholesterolemia    Hypertension    Ischemic cardiomyopathy    LVEF 25%-45%   IVCD (intraventricular conduction defect)    PVC's (premature ventricular contractions)    S/P colonoscopy August 2007   Hyperplastic polyps, rare sigmoid and descending colon diverticulosis, small internal hemorrhoids   [place] elevation [place] infarction) [place]   Past Surgical History:  Procedure Laterality Date   ABDOMINAL HYSTERECTOMY     CARDIAC CATHETERIZATION  April 2014   Med Rx   CATARACT EXTRACTION W/PHACO Right 09/15/2017   Procedure: CATARACT EXTRACTION PHACO AND INTRAOCULAR LENS PLACEMENT ([place]  Surgeon: ShRutherford GuysMD;  Location: [facility];  Service: Ophthalmology;  Laterality: Right;  CDE: 12.22   CATARACT EXTRACTION W/PHACO Left 09/29/2017   Procedure: CATARACT EXTRACTION PHACO AND INTRAOCULAR LENS PLACEMENT (IOC);  Surgeon: Rutherford Guys, MD;  Location: [facility];  Service: Ophthalmology;  Laterality: Left;  CDE: 10.02   COLONOSCOPY N/A 12/24/2015   Dr. Fields:moderate sized internal hemorrhoids/moderate diverticulosis, negative  microscopic colitis    EP IMPLANTABLE DEVICE  04/18/2015   BV ICD   EP IMPLANTABLE DEVICE N/A 04/18/2015   Procedure: BiV ICD Insertion CRT-D;  Surgeon: Deboraha Sprang, MD;  Location: [facility];  Service: Cardiovascular;  Laterality: N/A;   LEFT AND RIGHT HEART CATHETERIZATION WITH CORONARY ANGIOGRAM N/A 07/17/2014   Procedure: LEFT AND RIGHT HEART CATHETERIZATION WITH CORONARY ANGIOGRAM;  Surgeon: Blane Ohara, MD;  Location[facility] CATH LAB;  Service: Cardiovascular;  Laterality: N/A;   PERCUTANEOUS CORONARY STENT INTERVENTION (PCI-S)  07/17/2014   Procedure: PERCUTANEOUS CORONARY STENT INTERVENTION (PCI-S);  Surgeon: Blane Ohara, MD;  Location: [facility] CATH LAB;  Service: Cardiovascular;;  Distal RCA   S/P Hysterectomy       No outpatient medications have been marked as taking for the 07/20/19 encounter (Appointment) with Arnoldo Lenis, MD.     Allergies:   Lactose intolerance (gi) and Sulfa antibiotics   Social History   Tobacco Use   Smoking status: Current Every Day Smoker    Packs/day: 0.50    Years: 50.00    Pack years: 25.00    Types: Cigarettes    Start date: 02/26/1962   Smokeless tobacco: Never Used   Tobacco comment: down to less than 1/2 ppd  Substance Use Topics   Alcohol use: No    Alcohol/week: 0.0 standard drinks   Drug use: No     Family Hx: The patient's family history includes Diabetes Mellitus II in her brother, brother, brother, and sister; Hypertension in her brother, brother, brother, and sister. There is no history of Colon cancer.  ROS:   Please see the history of present illness.     All other systems reviewed and are negative.   Prior CV studies:   The following studies were reviewed today:  02/2013 Cath Procedural Findings:  Hemodynamics:  AO 116/45 with a mean of 70  LV 120/18  Coronary angiography:  Coronary dominance: right  Left mainstem: The left main is patent with 30% distal left main stenosis as the  vessel trifurcates into the LAD, intermediate Andrey Hoobler, and left circumflex.  Left anterior descending (LAD): The LAD is patent with diffuse disease noted. There is minimal calcification present. The first diagonal is patent with 30-50% diffuse disease. The LAD after the first septal perforator has 40-50% stenosis. The vessel reaches the apex and wraps around the left ventricular apex without significant stenosis.  Left circumflex (LCx): The intermediate Amareon Phung is small in caliber and diffusely diseased with 50-60% proximal vessel stenosis the AV groove circumflex is diffusely diseased with 50% ostial stenosis and 30% mid stenosis it supplies 2 small posterolateral Rishav Rockefeller  Right coronary artery (RCA): The RCA is diffusely diseased. There is significant tortuosity, especially around the junction of the mid and distal vessel. The vessel is diffusely calcified. The mid vessel has tandem 50% stenoses. The distal vessel has 80-90 % stenosis just before the bifurcation of the PDA and posterior AV segment. The PDA and posterior AV segment with 3 posterolateral branches are patent.  Left ventriculography: Left ventricular systolic function is moderately depressed. There is severe hypokinesis of the basal and midinferior  walls. The anterolateral and apical walls contract normally. The left ventricular ejection fraction is estimated at 45%.  Abdominal aortogram: The abdominal aorta is patent. The renal arteries are single bilaterally and they are widely patent. There appears to be nonobstructive stenosis of the right common iliac artery at the aortoiliac junction. The left common iliac artery appears to have significant ostial stenosis estimated at at least 75% angiographically. The visualized portions of the external iliac and common femoral arteries are patent  Final Conclusions:  1. Severe single-vessel coronary artery disease involving the distal right coronary artery 2. Moderate LV dysfunction with  segmental severe hypokinesis of the inferior wall and normal contraction of the antero-apex with estimated left ventricular ejection fraction of 45%. 3. Severe left common iliac artery stenosis Recommendations: The patient has severe distal RCA disease. She has a known history of inferior wall infarction and remote PCA. This fits with that clinical history. She has nonobstructive disease of the left coronary artery. Her left ventricular ejection fraction by ventriculography is clearly greater than 35%. With her history of previous MI, I do not think PCI is indicated with an absence of angina. Will carefully discussed symptoms with the patient, but I am inclined to treat her medically. Will also review indication for consideration of peripheral intervention for her iliac disease.   01/2013 Echo Study Conclusions  - Left ventricle: The cavity size was mildly to moderately dilated. Wall thickness was normal. Systolic function was severely reduced. The estimated ejection fraction was 25%. Severe diffuse hypokinesis. - Aortic valve: Mildly calcified annulus. Trileaflet. - Mitral valve: Calcified annulus. - Left atrium: The atrium was moderately dilated. - Right ventricle: The cavity size was normal. Wall thickness was mildly to moderately increased. - Right atrium: The atrium was mildly dilated. - Atrial septum: No defect or patent foramen ovale was identified. Impressions:  - Compared to the prior study of 03/13/11, there has been substantial deterioration in LV systolic function.  07/2014 Echo Study Conclusions  - Left ventricle: The cavity size was severely dilated. Wall thickness was normal. The estimated ejection fraction was 20%. Diffuse hypokinesis. There is akinesis of the inferolateral and inferior myocardium. Features are consistent with a pseudonormal left ventricular filling pattern, with concomitant abnormal relaxation and increased filling pressure (grade 2  diastolic dysfunction). Doppler parameters are consistent with high ventricular filling pressure. - Mitral valve: There was mild to moderate regurgitation. - Left atrium: The atrium was moderately dilated.  Impressions:  - Global hypokinesis; inferior and inferolateral akinesis; overall severely reduced LV function; grade 2 diastolic dysfunction with elevated left ventricular filling pressures, moderate LAE; mild to moderate MR. 07/2014 Cath PROCEDURAL FINDINGS  Hemodynamics:  AO 122/48  LV 118/14  RA 9  RV 41/11  PA 37/5 with a mean of 21  PCWP 27  Oxygen saturations  AO 90  PA 56  Cardiac output 3.5  Cardiac index 2.0  Coronary angiography:  Coronary dominance: right  Left mainstem: The left mainstem is patent with 30% distal left main stenosis.  Left anterior descending (LAD): The LAD is diffusely diseased in the midportion. There are sequential 50% stenoses, unchanged from previous study. The first diagonal Deborah Lazcano is large in distribution with diffuse nonobstructive disease noted. The mid and distal LAD are patent without significant disease.  Left circumflex (LCx): The left circumflex has 50% ostial stenosis, 50% mid vessel stenosis, and to OM branches with no significant disease. There is an intermediate Feleica Fulmore is diffusely diseased with up to 70% stenosis. This vessel  is small in caliber.  Right coronary artery (RCA): Dominant vessel. Tortuous with diffuse nonobstructive stenosis throughout the proximal and mid portions. There are diffuse 50% stenosis present. The distal vessel has an ulcerated-appearing irregular 90% stenosis involving the bifurcation of the PDA and posterolateral branches. The posterolateral branches are patent with mild diffuse disease. The PDA arises an acute angulation with 30-40% stenosis. The mid body of the PDA has an 80-90% stenosis in an area where the vessel is too small for PCI.  Left ventriculography: The ventricle appears the  synchronous. The basal and midinferior wall are akinetic. The basal inferolateral wall is severely hypokinetic. The anterolateral wall and apex contract normally. The estimated LVEF is 30%.  PCI Procedure Note: Following the diagnostic procedure, the decision was made to proceed with PCI of the distal RCA. The lesion is complex, there is a large amount of myocardium involved. I felt there was a risk of potentially compromising the PDA Angela Platner, but overall in my opinion the risk/benefit was favorable for PCI considering the severe stenosis involving the entire RCA distribution. The sheath was upsized to a 6 [place]. Weight-based bivalirudin was given for anticoagulation. The patient was loaded with Plavix 600 mg on the table. Once a therapeutic ACT was achieved, a 6 [place] JR 4 guide catheter was inserted. A cougar coronary guidewire was used to cross the lesion into the PLA Sachin Ferencz. The lesion was predilated with a 2.5 x 15 mm balloon. The lesion was then stented with a 3.0 by 56m Promus DES stent. The stent was postdilated with a 3.25 mm noncompliant balloon to 18 atmospheres. The PDA was stented across as the stent extended from the distal RCA proximal PLA Neyla Gauntt. The PDA was totally occluded. I made a long attempt with a whisper wire using multiple bands on the tip of the wire, but all of these attempts were unsuccessful at rewiring the PDA. The patient was completely asymptomatic and specifically denied any chest pain. Her hemodynamics remained stable and there were no changes in her rhythm or blood pressure. A final image of the left coronary artery was obtained and this demonstrated a collateral from the LAD to the PDA Vidyuth Belsito. Following PCI, there was 0% residual stenosis and TIMI-3 flow. Final angiography confirmed an excellent result. Femoral hemostasis was achieved will be achieved with manual hemostasis. The patient tolerated the PCI procedure well. There were no immediate procedural complications. The  patient was transferred to the post catheterization recovery area for further monitoring.  PCI Data:  Vessel - RCA/Segment - distal  Percent Stenosis (pre) 90  TIMI-flow 3  Stent 3.0 by 18mPromus DES  Percent Stenosis (post) 0  TIMI-flow (post) 3  Contrast: 175 cc Omnipaque  Radiation dose/Fluoro time: 15.5 minutes  Estimated Blood Loss: Minimal  Final Conclusions:  1. Nonobstructive disease involving the LAD, left circumflex, and OM/diagonal side branches 2. Severe stenosis of the distal RCA bifurcation, treated successfully with a drug-eluting stent into the PLA Kevonte Vanecek with residual occlusion of the PDA [facility] 3. Severe segmental LV systolic dysfunction Recommendations: Dual antiplatelet therapy with aspirin and Plavix for at least 12 months. Will cycle cardiac enzymes since the PDA Miata Culbreth is occluded after stenting.  10/2014 Echo Study Conclusions  - Procedure narrative: Transthoracic echocardiography. Image quality was suboptimal. The study was technically difficult, as a result of poor sound wave transmission and body habitus. - Left ventricle: Systolic function is severely reduced, estimated EF 15-20%. The cavity size was severely dilated. Doppler parameters are consistent with  abnormal left ventricular relaxation (grade 1 diastolic dysfunction). Doppler parameters are consistent with high ventricular filling pressure. Medial E/e&' 24. - Regional wall motion abnormality: Akinesis of the basal-mid inferior and basal inferolateral myocardium; severe hypokinesis of the apical inferior and mid inferolateral myocardium. Severe, diffuse hypokinesis. - Ventricular septum: Septal motion showed abnormal function and dyssynergy. These changes are consistent with intraventricular conduction delay. - Aortic valve: Mildly calcified annulus. Trileaflet; mildly thickened leaflets. - Mitral valve: Mildly dilated annulus. Mildly thickened  leaflets . There was moderate eccentric regurgitation. 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 - Left atrium: The atrium was severely dilated. Volume/bsa, S: 47.1 ml/m^2. - Right ventricle: Systolic function was mildly reduced. - Right atrium: The atrium was mildly dilated. - Atrial septum: There was increased thickness of the septum, consistent with lipomatous hypertrophy. - Tricuspid valve: There was mild regurgitation. - Pulmonary arteries: PA peak pressure: 42 mm Hg (S). Mildly elevated pulmonary pressures. - Pericardium, extracardiac: There was a small pericardial effusion, with no evidence of tamponade physiology.  04/2015 Carotid US IMPRESSION: 1. Mild (1-49%) stenosis of the proximal right internal carotid artery secondary to smooth heterogeneous atherosclerotic plaque. No interval change compared to recent prior imaging from earlier this month. 2. Mild (1-49%) stenosis of the proximal left internal carotid artery secondary to heterogeneous and irregular atherosclerotic plaque. No interval change compared to recent prior imaging from earlier this month. 3. Vertebral arteries remain patent with normal antegrade flow. Signed,    10/2015 echo Study Conclusions  - Left ventricle: The cavity size was moderately to severely dilated. Wall thickness was normal. Systolic function was severely reduced. The estimated ejection fraction was in the range of 25% to 30%. Diffuse hypokinesis. There is akinesis of the basalanteroseptal and anterior myocardium. Doppler parameters are consistent with abnormal left ventricular relaxation (grade 1 diastolic dysfunction). - Mitral valve: Calcified annulus. Mildly calcified leaflets . There was mild regurgitation. - Right atrium: Central venous pressure (est): 3 mm Hg. - Atrial septum: No defect or patent foramen ovale was identified. - Tricuspid valve: There was trivial regurgitation. - Pulmonary  arteries: PA peak pressure: 27 mm Hg (S). - Pericardium, extracardiac: A prominent pericardial fat pad was present.  Impressions:  - Moderate to severe LV chamber dilatation with LVEF approximately 25-30%. There is diffuse hypokinesis with near akinesis of the basal anteroseptal and anterior myocardium. Compared to the previous study from June 2016 there has been some improvement in LVEF. Grade 1 diastolic dysfunction. MAC with mild mitral regurgitation. Trivial tricuspid regurgitation with PASP 27 mmHg.  Transthoracic echocardiography. M-mode, complete 2D, spectral Doppler, and color Doppler. Birthdate: Patient birthdate: Nov 13, 1940. Age: Patient is 78 yr old. Sex: Gender: female. BMI: 27.5 kg/m^2. Blood pressure:   98/47 Patient status: Inpatient. Study date: Study date: 10/04/2015. Study time: 11:27 AM. Location: Echo laboratory.  Labs/Other Tests and Data Reviewed:    EKG:  No ECG reviewed.  Recent Labs: No results found for requested labs within last 8760 hours.   Recent Lipid Panel Lab Results  Component Value Date/Time   CHOL 138 04/30/2015 05:43 AM   TRIG 117 04/30/2015 05:43 AM   HDL 32 (L) 04/30/2015 05:43 AM   CHOLHDL 4.3 04/30/2015 05:43 AM   LDLCALC 83 04/30/2015 05:43 AM    Wt Readings from Last 3 Encounters:  01/10/19 145 lb 3.2 oz (65.9 kg)  12/06/18 144 lb (65.3 kg)  09/17/18 142 lb (64.4 kg)     Objective:    Vital Signs:   Today's Vitals  07/20/19 0848  Weight: 142 lb (64.4 kg)  Height: _0  (1.575 m)   Body mass index is 25.97 kg/m.  Normal affect. Normal speech pattern and tone. Comfortable, no apparent distress. No audible signs of SOB of wheezing.   ASSESSMENT & PLAN:    1. CAD - no recent symptoms, continue current meds   2. Chronic systolic HF - Medical therpay limited by soft bp's and renal insufficiency - no recent symptoms, continue current meds   3. Hyperlipidemia -statin  changedpreviouslydue to cost, - LDL has been at goal, upcoming labs with pcp -continue current therapy.   4. CKD III - limit diuretics and nephrotoxic meds  COVID-19 Education: The signs and symptoms of COVID-19 were discussed with the patient and how to seek care for testing (follow up with PCP or arrange E-visit).  The importance of social distancing was discussed today.  Time:   Today, I have spent 20 minutes with the patient with telehealth technology discussing the above problems.     Medication Adjustments/Labs and Tests Ordered: Current medicines are reviewed at length with the patient today.  Concerns regarding medicines are outlined above.   Tests Ordered: No orders of the defined types were placed in this encounter.   Medication Changes: No orders of the defined types were placed in this encounter.   Follow Up:  Virtual Visit in 4 month(s)  Signed, Carlyle Dolly, MD  07/20/2019 8:14 AM    [place]

## 2019-07-20 NOTE — Patient Instructions (Signed)
Medication Instructions:   Your physician recommends that you continue on your current medications as directed. Please refer to the Current Medication list given to you today.  Labwork:  none  Testing/Procedures:  none  Follow-Up:  Your physician recommends that you schedule a follow-up appointment in: 4 months.   Any Other Special Instructions Will Be Listed Below (If Applicable).  If you need a refill on your cardiac medications before your next appointment, please call your pharmacy. 

## 2019-07-28 ENCOUNTER — Other Ambulatory Visit (HOSPITAL_COMMUNITY): Payer: Self-pay | Admitting: Advanced Practice Midwife

## 2019-07-28 DIAGNOSIS — Z1231 Encounter for screening mammogram for malignant neoplasm of breast: Secondary | ICD-10-CM

## 2019-08-04 ENCOUNTER — Ambulatory Visit (HOSPITAL_COMMUNITY)
Admission: RE | Admit: 2019-08-04 | Discharge: 2019-08-04 | Disposition: A | Discharge status: Home / Self Care | Payer: Medicare Other | Source: Ambulatory Visit | Attending: Advanced Practice Midwife | Admitting: Advanced Practice Midwife

## 2019-08-04 ENCOUNTER — Other Ambulatory Visit: Payer: Self-pay

## 2019-08-04 DIAGNOSIS — Z1231 Encounter for screening mammogram for malignant neoplasm of breast: Secondary | ICD-10-CM | POA: Insufficient documentation

## 2019-08-15 ENCOUNTER — Telehealth: Payer: Self-pay | Admitting: Cardiology

## 2019-08-15 MED ORDER — LOSARTAN POTASSIUM 25 MG PO TABS: ORAL_TABLET | ORAL | 1 refills | Status: DC

## 2019-08-15 NOTE — Telephone Encounter (Signed)
Needing refill on losartan (COZAAR) 25 MG tablet [[id number]]

## 2019-08-15 NOTE — Telephone Encounter (Signed)
Refill complete 

## 2019-08-25 DIAGNOSIS — E782 Mixed hyperlipidemia: Secondary | ICD-10-CM | POA: Diagnosis not present

## 2019-08-25 DIAGNOSIS — Z23 Encounter for immunization: Secondary | ICD-10-CM | POA: Diagnosis not present

## 2019-08-25 DIAGNOSIS — E063 Autoimmune thyroiditis: Secondary | ICD-10-CM | POA: Diagnosis not present

## 2019-08-25 DIAGNOSIS — I5022 Chronic systolic (congestive) heart failure: Secondary | ICD-10-CM | POA: Diagnosis not present

## 2019-08-25 DIAGNOSIS — I129 Hypertensive chronic kidney disease with stage 1 through stage 4 chronic kidney disease, or unspecified chronic kidney disease: Secondary | ICD-10-CM | POA: Diagnosis not present

## 2019-08-29 ENCOUNTER — Ambulatory Visit (INDEPENDENT_AMBULATORY_CARE_PROVIDER_SITE_OTHER): Payer: Medicare Other

## 2019-08-29 DIAGNOSIS — Z720 Tobacco use: Secondary | ICD-10-CM | POA: Diagnosis not present

## 2019-08-29 DIAGNOSIS — Z95810 Presence of automatic (implantable) cardiac defibrillator: Secondary | ICD-10-CM

## 2019-08-29 DIAGNOSIS — Z0000 Encounter for general adult medical examination without abnormal findings: Secondary | ICD-10-CM | POA: Diagnosis not present

## 2019-08-29 DIAGNOSIS — E782 Mixed hyperlipidemia: Secondary | ICD-10-CM | POA: Diagnosis not present

## 2019-08-29 DIAGNOSIS — I340 Nonrheumatic mitral (valve) insufficiency: Secondary | ICD-10-CM | POA: Diagnosis not present

## 2019-08-29 DIAGNOSIS — N183 Chronic kidney disease, stage 3 unspecified: Secondary | ICD-10-CM | POA: Diagnosis not present

## 2019-08-29 DIAGNOSIS — I5022 Chronic systolic (congestive) heart failure: Secondary | ICD-10-CM | POA: Diagnosis not present

## 2019-08-29 DIAGNOSIS — E875 Hyperkalemia: Secondary | ICD-10-CM | POA: Diagnosis not present

## 2019-08-29 DIAGNOSIS — I129 Hypertensive chronic kidney disease with stage 1 through stage 4 chronic kidney disease, or unspecified chronic kidney disease: Secondary | ICD-10-CM | POA: Diagnosis not present

## 2019-08-29 DIAGNOSIS — E063 Autoimmune thyroiditis: Secondary | ICD-10-CM | POA: Diagnosis not present

## 2019-08-29 DIAGNOSIS — E871 Hypo-osmolality and hyponatremia: Secondary | ICD-10-CM | POA: Diagnosis not present

## 2019-08-29 DIAGNOSIS — Z950 Presence of cardiac pacemaker: Secondary | ICD-10-CM | POA: Diagnosis not present

## 2019-08-29 DIAGNOSIS — I25119 Atherosclerotic heart disease of native coronary artery with unspecified angina pectoris: Secondary | ICD-10-CM | POA: Diagnosis not present

## 2019-08-31 ENCOUNTER — Telehealth: Payer: Self-pay

## 2019-08-31 NOTE — Telephone Encounter (Signed)
Call to Dr Raul Del office and requested copy of latest lab results to be faxed.  Office staff will fax today and provided fax number.

## 2019-08-31 NOTE — Progress Notes (Signed)
EPIC Encounter for ICM Monitoring  Patient Name: Nicole Clements is a 78 y.o. female Date: 08/31/2019 Primary Care Physican: Mayra Neer, MD Primary Cardiologist:Branch Electrophysiologist: Vergie Living Pacing:92.6% 08/31/2019 Weight: 140-142 lbs   Spoke with patient and is doing well.  She reported Dr Brigitte Pulse requested Furosemide and Potassium be held x 4 days due to low sodium.  Pt will have repeat labs tomorrow.    Optivol thoracic impedancenormal.  Prescribed:Furosemide 20 mgtake1 tabletdaily.You may take an extra 20 mg daily as needed for swelling.  Potassium 20 mEq take 1 tablet daily.  Lab 08/20/2018 Creatinine 1.78, BUN 28, Potassium 5.2, Sodium 128, GFR 28-34  Recommendations:  Advised patient if Dr Brigitte Pulse discontinues Lasix to monitor closely for fluid symptoms.    Call to Dr Raul Del office and asked to send fax of lab results.    Follow-up plan: ICM clinic phone appointment on 10/04/2019.   91 day device clinic remote transmission 10/03/2019.    Copy of ICM check sent to Dr. Caryl Comes.   3 month ICM trend: 08/29/2019    1 Year ICM trend:       Rosalene Billings, RN 08/31/2019 12:55 PM

## 2019-09-01 DIAGNOSIS — E871 Hypo-osmolality and hyponatremia: Secondary | ICD-10-CM | POA: Diagnosis not present

## 2019-10-03 ENCOUNTER — Ambulatory Visit (INDEPENDENT_AMBULATORY_CARE_PROVIDER_SITE_OTHER): Payer: Medicare Other | Admitting: *Deleted

## 2019-10-03 DIAGNOSIS — I255 Ischemic cardiomyopathy: Secondary | ICD-10-CM

## 2019-10-03 LAB — CUP PACEART REMOTE DEVICE CHECK
Battery Remaining Longevity: 28 mo
Battery Voltage: 2.95 V
Brady Statistic AP VP Percent: 98.06 %
Brady Statistic AP VS Percent: 1.51 %
Brady Statistic AS VP Percent: 0.38 %
Brady Statistic AS VS Percent: 0.05 %
Brady Statistic RA Percent Paced: 90.39 %
Brady Statistic RV Percent Paced: 26.73 %
Date Time Interrogation Session: 20201130022825
HighPow Impedance: 52 Ohm
Implantable Lead Implant Date: 20160615
Implantable Lead Implant Date: 20160615
Implantable Lead Implant Date: 20160615
Implantable Lead Location: 753858
Implantable Lead Location: 753859
Implantable Lead Location: 753860
Implantable Lead Model: 4398
Implantable Lead Model: 5076
Implantable Pulse Generator Implant Date: 20160615
Lead Channel Impedance Value: 342 Ohm
Lead Channel Impedance Value: 342 Ohm
Lead Channel Impedance Value: 361 Ohm
Lead Channel Impedance Value: 418 Ohm
Lead Channel Impedance Value: 456 Ohm
Lead Channel Impedance Value: 456 Ohm
Lead Channel Impedance Value: 532 Ohm
Lead Channel Impedance Value: 532 Ohm
Lead Channel Impedance Value: 589 Ohm
Lead Channel Impedance Value: 589 Ohm
Lead Channel Impedance Value: 760 Ohm
Lead Channel Impedance Value: 817 Ohm
Lead Channel Impedance Value: 817 Ohm
Lead Channel Pacing Threshold Amplitude: 0.625 V
Lead Channel Pacing Threshold Amplitude: 0.625 V
Lead Channel Pacing Threshold Amplitude: 1.625 V
Lead Channel Pacing Threshold Pulse Width: 0.4 ms
Lead Channel Pacing Threshold Pulse Width: 0.4 ms
Lead Channel Pacing Threshold Pulse Width: 0.4 ms
Lead Channel Sensing Intrinsic Amplitude: 19.375 mV
Lead Channel Sensing Intrinsic Amplitude: 19.375 mV
Lead Channel Sensing Intrinsic Amplitude: 2.75 mV
Lead Channel Sensing Intrinsic Amplitude: 2.75 mV
Lead Channel Setting Pacing Amplitude: 1.5 V
Lead Channel Setting Pacing Amplitude: 2 V
Lead Channel Setting Pacing Amplitude: 2.75 V
Lead Channel Setting Pacing Pulse Width: 0.4 ms
Lead Channel Setting Pacing Pulse Width: 0.4 ms
Lead Channel Setting Sensing Sensitivity: 0.45 mV

## 2019-10-04 ENCOUNTER — Telehealth: Payer: Self-pay

## 2019-10-04 ENCOUNTER — Ambulatory Visit (INDEPENDENT_AMBULATORY_CARE_PROVIDER_SITE_OTHER): Payer: Medicare Other

## 2019-10-04 DIAGNOSIS — Z95810 Presence of automatic (implantable) cardiac defibrillator: Secondary | ICD-10-CM | POA: Diagnosis not present

## 2019-10-04 DIAGNOSIS — I5022 Chronic systolic (congestive) heart failure: Secondary | ICD-10-CM

## 2019-10-04 NOTE — Telephone Encounter (Signed)
Remote ICM transmission received.  Attempted call to patient regarding ICM remote transmission and left detailed message per DPR to return call.   

## 2019-10-04 NOTE — Progress Notes (Signed)
EPIC Encounter for ICM Monitoring  Patient Name: Nicole Clements is a 78 y.o. female Date: 10/04/2019 Primary Care Physican: Mayra Neer, MD Primary Cardiologist:Branch Electrophysiologist: Caryl Comes Nephrologist: Joylene Grapes Pacing:91.4% 08/31/2019 Weight: 140-142lbs 10/04/2019 Weight: 143 lbs   Spoke with patient.  She is asymptomatic for fluid accumulation and feels fine.  Dr Brigitte Pulse changed Furosemide and Potassium to every other day at October office visit due kidneys.   Optivol thoracic impedancesuggesting possible ongoing fluid accumulation since 09/01/2019.  Prescribed: As prescribed by Dr Brigitte Pulse, patient is taking Furosemide 20 mgevery other day and Potassium 20 mEq take 1 tablet every other day.   Lab 08/25/2019 Creatinine 1.57, BUN 23, Potassium 5.6, Sodium 127, GFR 32-39  Recommendations:   Reinforced limiting salt intake to < 2000 mg daily and fluid intake to 64 oz daily.  Encouraged to call if experiencing fluid symptoms.  Advised if any recommendations from Dr Harl Bowie will call her back.  Follow-up plan: ICM clinic phone appointment on 10/17/2019 to recheck fluid levels.  Office appt 11/21/2019 with Dr. Harl Bowie.    Copy of ICM check sent to Dr. Caryl Comes and Dr Harl Bowie for review and recommendations if needed.   3 month ICM trend: 10/03/2019    1 Year ICM trend:       Rosalene Billings, RN 10/04/2019 10:47 AM

## 2019-10-17 ENCOUNTER — Ambulatory Visit (INDEPENDENT_AMBULATORY_CARE_PROVIDER_SITE_OTHER): Payer: Medicare Other

## 2019-10-17 DIAGNOSIS — Z95810 Presence of automatic (implantable) cardiac defibrillator: Secondary | ICD-10-CM

## 2019-10-17 DIAGNOSIS — I5022 Chronic systolic (congestive) heart failure: Secondary | ICD-10-CM

## 2019-10-19 NOTE — Progress Notes (Signed)
EPIC Encounter for ICM Monitoring  Patient Name: Nicole Clements is a 78 y.o. female Date: 10/19/2019 Primary Care Physican: Mayra Neer, MD Primary Cardiologist:Branch Electrophysiologist: Caryl Comes Nephrologist: Joylene Grapes Pacing:95.6% 10/04/2019 Weight: 143 lbs   Spoke with patient.  She is asymptomatic for fluid accumulation.  Optivol thoracic impedanceimproved since 10/03/2020 remote transmission and trending close to baseline normal.  Prescribed: As prescribed by Dr Brigitte Pulse, patient is taking Furosemide 20 mgevery other day andPotassium 20 mEq take 1 tablet every other day.   Lab 08/25/2019 Creatinine 1.57, BUN 23, Potassium 5.6, Sodium 127, GFR 32-39  Recommendations: No changes and encouraged to call if experiencing any fluid symptoms.  Follow-up plan: ICM clinic phone appointment on 11/18/2019.   Office appt 11/21/2019 with Dr. Harl Bowie.    Copy of ICM check sent to Dr. Caryl Comes.   3 month ICM trend: 10/17/2019    1 Year ICM trend:       Rosalene Billings, RN 10/19/2019 11:28 AM

## 2019-10-26 NOTE — Progress Notes (Signed)
ICD remote 

## 2019-11-01 ENCOUNTER — Other Ambulatory Visit: Payer: Self-pay | Admitting: Cardiology

## 2019-11-01 MED ORDER — POTASSIUM CHLORIDE CRYS ER 20 MEQ PO TBCR: 20.0000 meq | EXTENDED_RELEASE_TABLET | Freq: Every day | ORAL | 2 refills | Status: DC

## 2019-11-01 NOTE — Telephone Encounter (Signed)
*  STAT* If patient is at the pharmacy, call can be transferred to refill team.   1. Which medications need to be refilled?  potassium chloride SA (K-DUR,KLOR-CON) 20 MEQ tablet   2. Which pharmacy/location (including street and city if local pharmacy) is medication to be sent to? [facility]  [address] [place]  3. Do they need a 30 day or 90 day supply? [place]

## 2019-11-01 NOTE — Telephone Encounter (Signed)
Refilled per request.

## 2019-11-16 ENCOUNTER — Ambulatory Visit: Payer: Medicare Other | Admitting: Cardiology

## 2019-11-18 ENCOUNTER — Ambulatory Visit (INDEPENDENT_AMBULATORY_CARE_PROVIDER_SITE_OTHER): Payer: Medicare Other

## 2019-11-18 DIAGNOSIS — I5022 Chronic systolic (congestive) heart failure: Secondary | ICD-10-CM

## 2019-11-18 DIAGNOSIS — Z95810 Presence of automatic (implantable) cardiac defibrillator: Secondary | ICD-10-CM

## 2019-11-21 ENCOUNTER — Telehealth: Payer: Self-pay

## 2019-11-21 ENCOUNTER — Encounter: Payer: Self-pay | Admitting: Cardiology

## 2019-11-21 ENCOUNTER — Telehealth (INDEPENDENT_AMBULATORY_CARE_PROVIDER_SITE_OTHER): Payer: Medicare Other | Admitting: Cardiology

## 2019-11-21 VITALS — BP 117/56 | HR 74 | Ht 62.0 in | Wt 143.0 lb

## 2019-11-21 DIAGNOSIS — I2510 Atherosclerotic heart disease of native coronary artery without angina pectoris: Secondary | ICD-10-CM

## 2019-11-21 DIAGNOSIS — E782 Mixed hyperlipidemia: Secondary | ICD-10-CM

## 2019-11-21 DIAGNOSIS — I5022 Chronic systolic (congestive) heart failure: Secondary | ICD-10-CM | POA: Diagnosis not present

## 2019-11-21 DIAGNOSIS — N183 Chronic kidney disease, stage 3 unspecified: Secondary | ICD-10-CM

## 2019-11-21 NOTE — Telephone Encounter (Signed)
Virtual Visit Pre-Appointment Phone Call  "(Name), I am calling you today to discuss your upcoming appointment. We are currently trying to limit exposure to the virus that causes COVID-19 by seeing patients at home rather than in the office."  "What is the BEST phone number to call the day of the visit?" - include this in appointment notes  "Do you have or have access to (through a family member/friend) a smartphone with video capability that we can use for your visit?" If yes - list this number in appt notes as "cell" (if different from BEST phone #) and list the appointment type as a VIDEO visit in appointment notes If no - list the appointment type as a PHONE visit in appointment notes  Confirm consent - "In the setting of the current Covid19 crisis, you are scheduled for a (phone or video) visit with your provider on (date) at (time).  Just as we do with many in-office visits, in order for you to participate in this visit, we must obtain consent.  If you'd like, I can send this to your mychart (if signed up) or email for you to review.  Otherwise, I can obtain your verbal consent now.  All virtual visits are billed to your insurance company just like a normal visit would be.  By agreeing to a virtual visit, we'd like you to understand that the technology does not allow for your provider to perform an examination, and thus may limit your provider's ability to fully assess your condition. If your provider identifies any concerns that need to be evaluated in person, we will make arrangements to do so.  Finally, though the technology is pretty good, we cannot assure that it will always work on either your or our end, and in the setting of a video visit, we may have to convert it to a phone-only visit.  In either situation, we cannot ensure that we have a secure connection.  Are you willing to proceed?" STAFF: Did the patient verbally acknowledge consent to telehealth visit? Document YES/NO here:    Advise patient to be prepared - "Two hours prior to your appointment, go ahead and check your blood pressure, pulse, oxygen saturation, and your weight (if you have the equipment to check those) and write them all down. When your visit starts, your provider will ask you for this information. If you have an Apple Watch or Kardia device, please plan to have heart rate information ready on the day of your appointment. Please have a pen and paper handy nearby the day of the visit as well."  Give patient instructions for MyChart download to smartphone OR Doximity/Doxy.me as below if video visit (depending on what platform provider is using)  Inform patient they will receive a phone call 15 minutes prior to their appointment time (may be from unknown caller ID) so they should be prepared to answer    TELEPHONE CALL NOTE  Nicole Clements has been deemed a candidate for a follow-up tele-health visit to limit community exposure during the Covid-19 pandemic. I spoke with the patient via phone to ensure availability of phone/video source, confirm preferred email & phone number, and discuss instructions and expectations.  I reminded Nicole Clements to be prepared with any vital sign and/or heart rhythm information that could potentially be obtained via home monitoring, at the time of her visit. I reminded Nicole Clements to expect a phone call prior to her visit.  [name] 11/21/2019 10:06 AM  INSTRUCTIONS FOR DOWNLOADING THE MYCHART APP TO SMARTPHONE  - The patient must first make sure to have activated MyChart and know their login information - If Apple, go to [employer] and type in MyChart in the search bar and download the app. If Android, ask patient to go to [employer] and type in [place] in the search bar and download the app. The app is free but as with any other app downloads, their phone may require them to verify saved payment information or Apple/Android password.  - The patient will  need to then log into the app with their MyChart username and password, and select [facility] as their healthcare provider to link the account. When it is time for your visit, go to the MyChart app, find appointments, and click Begin Video Visit. Be sure to Select Allow for your device to access the Microphone and Camera for your visit. You will then be connected, and your provider will be with you shortly.  **If they have any issues connecting, or need assistance please contact MyChart service desk [phone])**  **If using a computer, in order to ensure the best quality for their visit they will need to use either of the following Internet Browsers: [employer], or Google Chrome**  IF USING DOXIMITY or DOXY.ME - The patient will receive a link just prior to their visit by text.     FULL LENGTH CONSENT FOR TELE-HEALTH VISIT   I hereby voluntarily request, consent and authorize [facility] and its employed or contracted physicians, physician assistants, nurse practitioners or other licensed health care professionals (the Practitioner), to provide me with telemedicine health care services (the "Services") as deemed necessary by the treating Practitioner. I acknowledge and consent to receive the Services by the Practitioner via telemedicine. I understand that the telemedicine visit will involve communicating with the Practitioner through live audiovisual communication technology and the disclosure of certain medical information by electronic transmission. I acknowledge that I have been given the opportunity to request an in-person assessment or other available alternative prior to the telemedicine visit and am voluntarily participating in the telemedicine visit.  I understand that I have the right to withhold or withdraw my consent to the use of telemedicine in the course of my care at any time, without affecting my right to future care or treatment, and that the Practitioner or I  may terminate the telemedicine visit at any time. I understand that I have the right to inspect all information obtained and/or recorded in the course of the telemedicine visit and may receive copies of available information for a reasonable fee.  I understand that some of the potential risks of receiving the Services via telemedicine include:  Delay or interruption in medical evaluation due to technological equipment failure or disruption; Information transmitted may not be sufficient (e.g. poor resolution of images) to allow for appropriate medical decision making by the Practitioner; and/or  In rare instances, security protocols could fail, causing a breach of personal health information.  Furthermore, I acknowledge that it is my responsibility to provide information about my medical history, conditions and care that is complete and accurate to the best of my ability. I acknowledge that Practitioner's advice, recommendations, and/or decision may be based on factors not within their control, such as incomplete or inaccurate data provided by me or distortions of diagnostic images or specimens that may result from electronic transmissions. I understand that the practice of medicine is not an exact science and that Practitioner  makes no warranties or guarantees regarding treatment outcomes. I acknowledge that I will receive a copy of this consent concurrently upon execution via email to the email address I last provided but may also request a printed copy by calling the office of [facility].    I understand that my insurance will be billed for this visit.   I have read or had this consent read to me. I understand the contents of this consent, which adequately explains the benefits and risks of the Services being provided via telemedicine.  I have been provided ample opportunity to ask questions regarding this consent and the Services and have had my questions answered to my satisfaction. I give my  informed consent for the services to be provided through the use of telemedicine in my medical care  By participating in this telemedicine visit I agree to the above.

## 2019-11-21 NOTE — Progress Notes (Signed)
Virtual Visit via Telephone Note   This visit type was conducted due to national recommendations for restrictions regarding the COVID-19 Pandemic (e.g. social distancing) in an effort to limit this patient's exposure and mitigate transmission in our community.  Due to her co-morbid illnesses, this patient is at least at moderate risk for complications without adequate follow up.  This format is felt to be most appropriate for this patient at this time.  The patient did not have access to video technology/had technical difficulties with video requiring transitioning to audio format only (telephone).  All issues noted in this document were discussed and addressed.  No physical exam could be performed with this format.  Please refer to the patient's chart for her  consent to telehealth for [facility].   Date:  11/21/2019   ID:  Nicole Clements, DOB 06-20-1941, MRN [id number]  Patient Location: Home Provider Location: Home.   PCP:  Mayra Neer, MD  Cardiologist:  Carlyle Dolly, MD  Electrophysiologist:  None   Evaluation Performed:  Follow-Up Visit  Chief Complaint:  Follow up visit  History of Present Illness:    Nicole Clements is a 79 y.o. female seen today for follow up of the following medical problems.   1. 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 Admit 07/2014 with CHF and troponin elevation - cath 07/2014, LM 30% distal, LAD mid 50%, LCX ostial 50% and mid 50%, small intermediate Reylene Stauder 70%. RCA diffuse 50%, distal RCA with ulcerated 90% lesion, PDA 80-90% too small for PCI. LVEF 30%. Received DES to distal RCA, post procedure the PDA became occluded.  - echo 07/2014 LVEF 20%, inferior akinesis, grade II diastolic dysfunction. Repeat echo 10/2014 LVEF remains 15-20%.  - 04/2015 CRT-D device placed by Dr Caryl Comes. Normal function Jan 2018.  10/2015 echo LVEF 25-30% -could not afford entresto, back on losartan.    - denies any SOB or DOE. No recent LE  edema - weights stable 142-143 lbs   2. Mitral regurgitation - mild by most recent echo - no recent symptoms  3. Hyperlipidemia - atorva was too expensive, changed to pravastatinby pcp  08/2019 TC 130 HDL 36 TG 97 LDL 73 - compliant with staitn.   4. CKDIII - Working to limit nephrotoxic drugs - most recent labs 08/2019 showed Cr 1.5, trending down.   The patient does not have symptoms concerning for COVID-19 infection (fever, chills, cough, or new shortness of breath).    Past Medical History:  Diagnosis Date   AICD (automatic cardioverter/defibrillator) present    CAD (coronary artery disease) April 2014   DES to PLA, occluded PDA 07/2014   CHF (congestive heart failure) (HCC)    Hypercholesterolemia    Hypertension    Ischemic cardiomyopathy    LVEF 25%-45%   IVCD (intraventricular conduction defect)    PVC's (premature ventricular contractions)    S/P colonoscopy August 2007   Hyperplastic polyps, rare sigmoid and descending colon diverticulosis, small internal hemorrhoids   STEMI (ST elevation myocardial infarction) ([place]) 07/22/1996   Past Surgical History:  Procedure Laterality Date   ABDOMINAL HYSTERECTOMY     CARDIAC CATHETERIZATION  April 2014   Med Rx   CATARACT EXTRACTION W/PHACO Right 09/15/2017   Procedure: CATARACT EXTRACTION PHACO AND INTRAOCULAR LENS PLACEMENT ([place]);  Surgeon: Rutherford Guys, MD;  Location: [facility];  Service: Ophthalmology;  Laterality: Right;  CDE: 12.22   CATARACT EXTRACTION W/PHACO Left 09/29/2017   Procedure: CATARACT EXTRACTION PHACO AND INTRAOCULAR LENS PLACEMENT (  [place]);  Surgeon: Rutherford Guys, MD;  Location: [facility];  Service: Ophthalmology;  Laterality: Left;  CDE: 10.02   COLONOSCOPY N/A 12/24/2015   Dr. Fields:moderate sized internal hemorrhoids/moderate diverticulosis, negative microscopic colitis    EP IMPLANTABLE DEVICE  04/18/2015   BV ICD   EP IMPLANTABLE DEVICE N/A 04/18/2015   Procedure: BiV ICD  Insertion CRT-D;  Surgeon: Deboraha Sprang, MD;  Location: [place] [facility] CV LAB;  Service: Cardiovascular;  Laterality: N/A;   LEFT AND RIGHT HEART CATHETERIZATION WITH CORONARY ANGIOGRAM N/A 07/17/2014   Procedure: LEFT AND RIGHT HEART CATHETERIZATION WITH CORONARY ANGIOGRAM;  Surgeon: Blane Ohara, MD;  Location: [facility] CATH LAB;  Service: Cardiovascular;  Laterality: N/A;   PERCUTANEOUS CORONARY STENT INTERVENTION (PCI-S)  07/17/2014   Procedure: PERCUTANEOUS CORONARY STENT INTERVENTION (PCI-S);  Surgeon: Blane Ohara, MD;  Location: [facility] CATH LAB;  Service: Cardiovascular;;  Distal RCA   S/P Hysterectomy       Current Meds  Medication Sig   acetaminophen (TYLENOL) 500 MG tablet Take 1,000 mg by mouth every 8 (eight) hours as needed for mild pain or headache.   aspirin EC 81 MG tablet Take 1 tablet (81 mg total) by mouth daily.   carvedilol (COREG) 25 MG tablet TAKE 1 TABLET BY MOUTH TWICE DAILY   Cholecalciferol (VITAMIN D3) 2000 UNITS capsule Take 2,000 Units by mouth daily.     furosemide (LASIX) 20 MG tablet Take 20 mg daily. You may take an extra 20 mg daily as needed for swelling.   losartan (COZAAR) 25 MG tablet TAKE 1 TABLET(25 MG) BY MOUTH DAILY   nitroGLYCERIN (NITROSTAT) 0.4 MG SL tablet Place 1 tablet (0.4 mg total) under the tongue every 5 (five) minutes x 3 doses as needed for chest pain.   Omega-3 Fatty Acids (FISH OIL) 1200 MG CAPS Take 1,200 mg by mouth daily.   potassium chloride SA (KLOR-CON) 20 MEQ tablet Take 1 tablet (20 mEq total) by mouth daily.   pravastatin (PRAVACHOL) 80 MG tablet Take 80 mg by mouth daily.   spironolactone (ALDACTONE) 25 MG tablet TAKE 1/2 TABLET(12.5 MG) BY MOUTH TWICE DAILY     Allergies:   Lactose intolerance (gi) and Sulfa antibiotics   Social History   Tobacco Use   Smoking status: Current Every Day Smoker    Packs/day: 0.50    Years: 50.00    Pack years: 25.00    Types: Cigarettes    Start date: 02/26/1962   Smokeless  tobacco: Never Used   Tobacco comment: down to less than 1/2 ppd  Substance Use Topics   Alcohol use: No    Alcohol/week: 0.0 standard drinks   Drug use: No     Family Hx: The patient's family history includes Diabetes Mellitus II in her brother, brother, brother, and sister; Hypertension in her brother, brother, brother, and sister. There is no history of Colon cancer.  ROS:   Please see the history of present illness.     All other systems reviewed and are negative.   Prior CV studies:   The following studies were reviewed today:  02/2013 [facility] Procedural Findings:  Hemodynamics:  AO 116/45 with a mean of 70  LV 120/18  Coronary angiography:  Coronary dominance: right  Left mainstem: The left main is patent with 30% distal left main stenosis as the vessel trifurcates into the LAD, intermediate Loann Chahal, and left circumflex.  Left anterior descending (LAD): The LAD is patent with diffuse disease noted. There is minimal calcification present.  The first diagonal is patent with 30-50% diffuse disease. The LAD after the first septal perforator has 40-50% stenosis. The vessel reaches the apex and wraps around the left ventricular apex without significant stenosis.  Left circumflex (LCx): The intermediate Dyani Babel is small in caliber and diffusely diseased with 50-60% proximal vessel stenosis the AV groove circumflex is diffusely diseased with 50% ostial stenosis and 30% mid stenosis it supplies 2 small posterolateral Lenette Rau  Right coronary artery (RCA): The RCA is diffusely diseased. There is significant tortuosity, especially around the junction of the mid and distal vessel. The vessel is diffusely calcified. The mid vessel has tandem 50% stenoses. The distal vessel has 80-90 % stenosis just before the bifurcation of the PDA and posterior AV segment. The PDA and posterior AV segment with 3 posterolateral branches are patent.  Left ventriculography: Left ventricular systolic  function is moderately depressed. There is severe hypokinesis of the basal and midinferior walls. The anterolateral and apical walls contract normally. The left ventricular ejection fraction is estimated at 45%.  Abdominal aortogram: The abdominal aorta is patent. The renal arteries are single bilaterally and they are widely patent. There appears to be nonobstructive stenosis of the right common iliac artery at the aortoiliac junction. The left common iliac artery appears to have significant ostial stenosis estimated at at least 75% angiographically. The visualized portions of the external iliac and common femoral arteries are patent  Final Conclusions:  1. Severe single-vessel coronary artery disease involving the distal right coronary artery 2. Moderate LV dysfunction with segmental severe hypokinesis of the inferior wall and normal contraction of the antero-apex with estimated left ventricular ejection fraction of 45%. 3. Severe left common iliac artery stenosis Recommendations: The patient has severe distal RCA disease. She has a known history of inferior wall infarction and remote PCA. This fits with that clinical history. She has nonobstructive disease of the left coronary artery. Her left ventricular ejection fraction by ventriculography is clearly greater than 35%. With her history of previous MI, I do not think PCI is indicated with an absence of angina. Will carefully discussed symptoms with the patient, but I am inclined to treat her medically. Will also review indication for consideration of peripheral intervention for her iliac disease.   01/2013 Echo Study Conclusions  - Left ventricle: The cavity size was mildly to moderately dilated. Wall thickness was normal. Systolic function was severely reduced. The estimated ejection fraction was 25%. Severe diffuse hypokinesis. - Aortic valve: Mildly calcified annulus. Trileaflet. - Mitral valve: Calcified annulus. - Left atrium:  The atrium was moderately dilated. - Right ventricle: The cavity size was normal. Wall thickness was mildly to moderately increased. - Right atrium: The atrium was mildly dilated. - Atrial septum: No defect or patent foramen ovale was identified. Impressions:  - Compared to the prior study of 03/13/11, there has been substantial deterioration in LV systolic function.  07/2014 Echo Study Conclusions  - Left ventricle: The cavity size was severely dilated. Wall thickness was normal. The estimated ejection fraction was 20%. Diffuse hypokinesis. There is akinesis of the inferolateral and inferior myocardium. Features are consistent with a pseudonormal left ventricular filling pattern, with concomitant abnormal relaxation and increased filling pressure (grade 2 diastolic dysfunction). Doppler parameters are consistent with high ventricular filling pressure. - Mitral valve: There was mild to moderate regurgitation. - Left atrium: The atrium was moderately dilated.  Impressions:  - Global hypokinesis; inferior and inferolateral akinesis; overall severely reduced LV function; grade 2 diastolic dysfunction with elevated left  ventricular filling pressures, moderate LAE; mild to moderate MR. 07/2014 Cath PROCEDURAL FINDINGS  Hemodynamics:  AO 122/48  LV 118/14  RA 9  RV 41/11  PA 37/5 with a mean of 21  PCWP 27  Oxygen saturations  AO 90  PA 56  Cardiac output 3.5  Cardiac index 2.0  Coronary angiography:  Coronary dominance: right  Left mainstem: The left mainstem is patent with 30% distal left main stenosis.  Left anterior descending (LAD): The LAD is diffusely diseased in the midportion. There are sequential 50% stenoses, unchanged from previous study. The first diagonal Jermayne Sweeney is large in distribution with diffuse nonobstructive disease noted. The mid and distal LAD are patent without significant disease.  Left circumflex (LCx): The left circumflex has 50%  ostial stenosis, 50% mid vessel stenosis, and to OM branches with no significant disease. There is an intermediate Dolan Xia is diffusely diseased with up to 70% stenosis. This vessel is small in caliber.  Right coronary artery (RCA): Dominant vessel. Tortuous with diffuse nonobstructive stenosis throughout the proximal and mid portions. There are diffuse 50% stenosis present. The distal vessel has an ulcerated-appearing irregular 90% stenosis involving the bifurcation of the PDA and posterolateral branches. The posterolateral branches are patent with mild diffuse disease. The PDA arises an acute angulation with 30-40% stenosis. The mid body of the PDA has an 80-90% stenosis in an area where the vessel is too small for PCI.  Left ventriculography: The ventricle appears the synchronous. The basal and midinferior wall are akinetic. The basal inferolateral wall is severely hypokinetic. The anterolateral wall and apex contract normally. The estimated LVEF is 30%.  PCI Procedure Note: Following the diagnostic procedure, the decision was made to proceed with PCI of the distal RCA. The lesion is complex, there is a large amount of myocardium involved. I felt there was a risk of potentially compromising the PDA Tru Leopard, but overall in my opinion the risk/benefit was favorable for PCI considering the severe stenosis involving the entire RCA distribution. The sheath was upsized to a 6 [place]. Weight-based bivalirudin was given for anticoagulation. The patient was loaded with Plavix 600 mg on the table. Once a therapeutic ACT was achieved, a 6 [place] JR 4 guide catheter was inserted. A cougar coronary guidewire was used to cross the lesion into the PLA Cornie Herrington. The lesion was predilated with a 2.5 x 15 mm balloon. The lesion was then stented with a 3.0 by 22m Promus DES stent. The stent was postdilated with a 3.25 mm noncompliant balloon to 18 atmospheres. The PDA was stented across as the stent extended from the distal RCA  proximal PLA Vung Kush. The PDA was totally occluded. I made a long attempt with a whisper wire using multiple bands on the tip of the wire, but all of these attempts were unsuccessful at rewiring the PDA. The patient was completely asymptomatic and specifically denied any chest pain. Her hemodynamics remained stable and there were no changes in her rhythm or blood pressure. A final image of the left coronary artery was obtained and this demonstrated a collateral from the LAD to the PDA Michelina Mexicano. Following PCI, there was 0% residual stenosis and TIMI-3 flow. Final angiography confirmed an excellent result. Femoral hemostasis was achieved will be achieved with manual hemostasis. The patient tolerated the PCI procedure well. There were no immediate procedural complications. The patient was transferred to the post catheterization recovery area for further monitoring.  PCI Data:  Vessel - RCA/Segment - distal  Percent Stenosis (pre) 90  TIMI-flow 3  Stent 3.0 by 53m Promus DES  Percent Stenosis (post) 0  TIMI-flow (post) 3  Contrast: 175 cc Omnipaque  Radiation dose/Fluoro time: 15.5 minutes  Estimated Blood Loss: Minimal  Final Conclusions:  1. Nonobstructive disease involving the LAD, left circumflex, and OM/diagonal side branches 2. Severe stenosis of the distal RCA bifurcation, treated successfully with a drug-eluting stent into the PLA Augustino Savastano with residual occlusion of the PDA Carter Kassel 3. Severe segmental LV systolic dysfunction Recommendations: Dual antiplatelet therapy with aspirin and Plavix for at least 12 months. Will cycle cardiac enzymes since the PDA Patina Spanier is occluded after stenting.  10/2014 Echo Study Conclusions  - Procedure narrative: Transthoracic echocardiography. Image quality was suboptimal. The study was technically difficult, as a result of poor sound wave transmission and body habitus. - Left ventricle: Systolic function is severely reduced, estimated EF  15-20%. The cavity size was severely dilated. Doppler parameters are consistent with abnormal left ventricular relaxation (grade 1 diastolic dysfunction). Doppler parameters are consistent with high ventricular filling pressure. Medial E/e&' 24. - Regional wall motion abnormality: Akinesis of the basal-mid inferior and basal inferolateral myocardium; severe hypokinesis of the apical inferior and mid inferolateral myocardium. Severe, diffuse hypokinesis. - Ventricular septum: Septal motion showed abnormal function and dyssynergy. These changes are consistent with intraventricular conduction delay. - Aortic valve: Mildly calcified annulus. Trileaflet; mildly thickened leaflets. - Mitral valve: Mildly dilated annulus. Mildly thickened leaflets . There was moderate eccentric regurgitation. 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 - Left atrium: The atrium was severely dilated. Volume/bsa, S: 47.1 ml/m^2. - Right ventricle: Systolic function was mildly reduced. - Right atrium: The atrium was mildly dilated. - Atrial septum: There was increased thickness of the septum, consistent with lipomatous hypertrophy. - Tricuspid valve: There was mild regurgitation. - Pulmonary arteries: PA peak pressure: 42 mm Hg (S). Mildly elevated pulmonary pressures. - Pericardium, extracardiac: There was a small pericardial effusion, with no evidence of tamponade physiology.  04/2015 Carotid [place]: 1. Mild (1-49%) stenosis of the proximal right internal carotid artery secondary to smooth heterogeneous atherosclerotic plaque. No interval change compared to recent prior imaging from earlier this month. 2. Mild (1-49%) stenosis of the proximal left internal carotid artery secondary to heterogeneous and irregular atherosclerotic plaque. No interval change compared to recent prior imaging from earlier this month. 3. Vertebral arteries remain patent with  normal antegrade flow. Signed,    10/2015 echo Study Conclusions  - Left ventricle: The cavity size was moderately to severely dilated. Wall thickness was normal. Systolic function was severely reduced. The estimated ejection fraction was in the range of 25% to 30%. Diffuse hypokinesis. There is akinesis of the basalanteroseptal and anterior myocardium. Doppler parameters are consistent with abnormal left ventricular relaxation (grade 1 diastolic dysfunction). - Mitral valve: Calcified annulus. Mildly calcified leaflets . There was mild regurgitation. - Right atrium: Central venous pressure (est): 3 mm Hg. - Atrial septum: No defect or patent foramen ovale was identified. - Tricuspid valve: There was trivial regurgitation. - Pulmonary arteries: PA peak pressure: 27 mm Hg (S). - Pericardium, extracardiac: A prominent pericardial fat pad was present.  Impressions:  - Moderate to severe LV chamber dilatation with LVEF approximately 25-30%. There is diffuse hypokinesis with near akinesis of the basal anteroseptal and anterior myocardium. Compared to the previous study from June 2016 there has been some improvement in LVEF. Grade 1 diastolic dysfunction. MAC with mild mitral regurgitation. Trivial tricuspid regurgitation with PASP 27 mmHg.  Transthoracic echocardiography. M-mode, complete 2D,  spectral Doppler, and color Doppler. Birthdate: Patient birthdate: 12-05-1940. Age: Patient is 79 yr old. Sex: Gender: female. BMI: 27.5 kg/m^2. Blood pressure:   98/47 Patient status: Inpatient. Study date: Study date: 10/04/2015. Study time: 11:27 AM. Location: Echo laboratory.  Labs/Other Tests and Data Reviewed:    EKG:  No ECG reviewed.  Recent Labs: No results found for requested labs within last 8760 hours.   Recent Lipid Panel Lab Results  Component Value Date/Time   CHOL 138 04/30/2015 05:43 AM   TRIG 117 04/30/2015 05:43 AM    HDL 32 (L) 04/30/2015 05:43 AM   CHOLHDL 4.3 04/30/2015 05:43 AM   LDLCALC 83 04/30/2015 05:43 AM    Wt Readings from Last 3 Encounters:  11/21/19 143 lb (64.9 kg)  07/20/19 142 lb (64.4 kg)  01/10/19 145 lb 3.2 oz (65.9 kg)     Objective:    Vital Signs:  BP (!) 117/56    Pulse 74    Ht _0  (1.575 m)    Wt 143 lb (64.9 kg)    BMI 26.16 kg/m    Normal affect. Normal speech pattern and tone. Comfortable, no apparent distress. No audible signs of SOB or wheezing.   ASSESSMENT & PLAN:    1. CAD - denies symptoms, continue current meds   2. Chronic systolic HF - Medical therpay limited by soft bp's and renal insufficiency - denies any symptoms, weights are stable. COnitnue current meds   3. Hyperlipidemia -statin changedpreviouslydue to cost, - lipids at goal, continue current meds  4. CKD III -some improvement in Cr since last year - continue to monitor, limit nephrotoxic meds.   COVID-19 Education: The signs and symptoms of COVID-19 were discussed with the patient and how to seek care for testing (follow up with PCP or arrange E-visit).  The importance of social distancing was discussed today.  Time:   Today, I have spent 21 minutes with the patient with telehealth technology discussing the above problems.     Medication Adjustments/Labs and Tests Ordered: Current medicines are reviewed at length with the patient today.  Concerns regarding medicines are outlined above.   Tests Ordered: No orders of the defined types were placed in this encounter.   Medication Changes: No orders of the defined types were placed in this encounter.   Follow Up:  Either In Person or Virtual in 4 month(s)  Signed, Carlyle Dolly, MD  11/21/2019 12:40 PM    [place]

## 2019-11-21 NOTE — Progress Notes (Signed)
EPIC Encounter for ICM Monitoring  Patient Name: Nicole Clements is a 78 y.o. female Date: 11/21/2019 Primary Care Physican: Lupita Raider, MD Primary Cardiologist:Branch Electrophysiologist: Graciela Husbands Nephrologist: Celene Skeen Pacing:94.2% 11/21/2019 Weight: 143 lbs   Spoke with patient. She is asymptomaticfor fluid accumulation.  She is doing well at this time.  Optivol thoracic impedancenormal.  Prescribed:As prescribed by Dr Clelia Croft, patient is takingFurosemide 20 mgevery other day andPotassium 20 mEq take 1 tabletevery other day.  Lab 10/22/2020Creatinine 1.57, BUN 23, Potassium 5.6, Sodium 127, OHF29-02  Recommendations: No changes and encouraged to call if experiencing any fluid symptoms.  Follow-up plan: ICM clinic phone appointment on 12/19/2019.   Office visit with Dr Graciela Husbands on 01/25/2020.  Copy of ICM check sent to Dr. Graciela Husbands.   3 month ICM trend: 11/18/2019    1 Year ICM trend:       Karie Soda, RN 11/21/2019 4:31 PM

## 2019-11-21 NOTE — Patient Instructions (Signed)
Medication Instructions:  Your physician recommends that you continue on your current medications as directed. Please refer to the Current Medication list given to you today.   Labwork: NONE  Testing/Procedures: NONE  Follow-Up: Your physician recommends that you schedule a follow-up appointment in: 4 MONTHS    Any Other Special Instructions Will Be Listed Below (If Applicable).     If you need a refill on your cardiac medications before your next appointment, please call your pharmacy.   

## 2019-12-19 ENCOUNTER — Ambulatory Visit (INDEPENDENT_AMBULATORY_CARE_PROVIDER_SITE_OTHER): Payer: Medicare Other

## 2019-12-19 DIAGNOSIS — Z95810 Presence of automatic (implantable) cardiac defibrillator: Secondary | ICD-10-CM

## 2019-12-19 DIAGNOSIS — I5022 Chronic systolic (congestive) heart failure: Secondary | ICD-10-CM

## 2019-12-20 ENCOUNTER — Telehealth: Payer: Self-pay | Admitting: Cardiology

## 2019-12-20 MED ORDER — FUROSEMIDE 20 MG PO TABS: ORAL_TABLET | ORAL | 3 refills | Status: DC

## 2019-12-20 NOTE — Telephone Encounter (Signed)
Refill sent.

## 2019-12-20 NOTE — Telephone Encounter (Signed)
Pt called refill in for her furosemide (LASIX) 20 MG tablet [[id number]]   on Wednesday of last week and she's not able to get it because they're waiting for JB to approve it

## 2019-12-21 ENCOUNTER — Ambulatory Visit: Payer: Medicare Other | Attending: Internal Medicine

## 2019-12-21 DIAGNOSIS — Z23 Encounter for immunization: Secondary | ICD-10-CM

## 2019-12-21 NOTE — Progress Notes (Signed)
   Covid-19 Vaccination Clinic  Name:  [name]    MRN: [id number] DOB: [birth year]  12/21/2019  [name] was observed post Covid-19 immunization for 15 minutes without incidence. She was provided with Vaccine Information Sheet and instruction to access the V-Safe system.   [name] was instructed to call 911 with any severe reactions post vaccine: [name] Difficulty breathing  . Swelling of your face and throat  . A fast heartbeat  . A bad rash all over your body  . Dizziness and weakness    Immunizations Administered    Name Date Dose VIS Date Route   Moderna COVID-19 Vaccine 12/21/2019 12:05 PM 0.5 mL 10/04/2019 Intramuscular   Manufacturer: Moderna   Lot: [id number]   NDC: 52479-980-01

## 2019-12-23 NOTE — Progress Notes (Addendum)
EPIC Encounter for ICM Monitoring  Patient Name: Nicole Clements is a 79 y.o. female Date: 12/23/2019 Primary Care Physican: Lupita Raider, MD Primary Cardiologist:Branch Electrophysiologist: Graciela Husbands Nephrologist: Celene Skeen Pacing:89.9% 12/23/2019 Weight: 143 lbs  Spoke with patient. She is asymptomaticfor fluid accumulation.  She is doing well at this time.  2nd COVID vaccine scheduled for 3/17.   Optivol thoracic impedancenormal.  Prescribed:As prescribed by Dr Clelia Croft, patient is takingFurosemide 20 mgevery other day andPotassium 20 mEq take 1 tabletevery other day.  Lab 10/22/2020Creatinine 1.57, BUN 23, Potassium 5.6, Sodium 127, [id number]  Recommendations:No changes and encouraged to call if experiencing any fluid symptoms.  Follow-up plan: ICM clinic phone appointment on3/22/2021. Office visit with Dr Graciela Husbands on 01/25/2020.  Copy of ICM check sent to Dr.Klein.   3 month ICM trend: 12/19/2019    1 Year ICM trend:       Karie Soda, RN 12/23/2019 3:00 PM

## 2020-01-02 ENCOUNTER — Other Ambulatory Visit: Payer: Self-pay

## 2020-01-02 ENCOUNTER — Ambulatory Visit (INDEPENDENT_AMBULATORY_CARE_PROVIDER_SITE_OTHER): Payer: Medicare Other | Admitting: *Deleted

## 2020-01-02 DIAGNOSIS — I255 Ischemic cardiomyopathy: Secondary | ICD-10-CM

## 2020-01-02 LAB — CUP PACEART REMOTE DEVICE CHECK
Battery Remaining Longevity: 28 mo
Battery Voltage: 2.94 V
Brady Statistic AP VP Percent: 98.22 %
Brady Statistic AP VS Percent: 1.57 %
Brady Statistic AS VP Percent: 0.19 %
Brady Statistic AS VS Percent: 0.03 %
Brady Statistic RA Percent Paced: 93.32 %
Brady Statistic RV Percent Paced: 30.33 %
Date Time Interrogation Session: 20210301012506
HighPow Impedance: 53 Ohm
Implantable Lead Implant Date: 20160615
Implantable Lead Implant Date: 20160615
Implantable Lead Implant Date: 20160615
Implantable Lead Location: 753858
Implantable Lead Location: 753859
Implantable Lead Location: 753860
Implantable Lead Model: 4398
Implantable Lead Model: 5076
Implantable Pulse Generator Implant Date: 20160615
Lead Channel Impedance Value: 361 Ohm
Lead Channel Impedance Value: 399 Ohm
Lead Channel Impedance Value: 399 Ohm
Lead Channel Impedance Value: 418 Ohm
Lead Channel Impedance Value: 456 Ohm
Lead Channel Impedance Value: 475 Ohm
Lead Channel Impedance Value: 551 Ohm
Lead Channel Impedance Value: 551 Ohm
Lead Channel Impedance Value: 589 Ohm
Lead Channel Impedance Value: 646 Ohm
Lead Channel Impedance Value: 817 Ohm
Lead Channel Impedance Value: 836 Ohm
Lead Channel Impedance Value: 874 Ohm
Lead Channel Pacing Threshold Amplitude: 0.625 V
Lead Channel Pacing Threshold Amplitude: 0.625 V
Lead Channel Pacing Threshold Amplitude: 1.375 V
Lead Channel Pacing Threshold Pulse Width: 0.4 ms
Lead Channel Pacing Threshold Pulse Width: 0.4 ms
Lead Channel Pacing Threshold Pulse Width: 0.4 ms
Lead Channel Sensing Intrinsic Amplitude: 1.25 mV
Lead Channel Sensing Intrinsic Amplitude: 1.25 mV
Lead Channel Sensing Intrinsic Amplitude: 16.5 mV
Lead Channel Sensing Intrinsic Amplitude: 16.5 mV
Lead Channel Setting Pacing Amplitude: 1.5 V
Lead Channel Setting Pacing Amplitude: 2 V
Lead Channel Setting Pacing Amplitude: 2.5 V
Lead Channel Setting Pacing Pulse Width: 0.4 ms
Lead Channel Setting Pacing Pulse Width: 0.4 ms
Lead Channel Setting Sensing Sensitivity: 0.45 mV

## 2020-01-02 MED ORDER — SPIRONOLACTONE 25 MG PO TABS: ORAL_TABLET | ORAL | 3 refills | Status: DC

## 2020-01-02 NOTE — Telephone Encounter (Signed)
refilled aldactone

## 2020-01-02 NOTE — Progress Notes (Signed)
ICD Remote  

## 2020-01-18 ENCOUNTER — Ambulatory Visit: Payer: Medicare Other | Attending: Internal Medicine

## 2020-01-18 DIAGNOSIS — Z23 Encounter for immunization: Secondary | ICD-10-CM

## 2020-01-18 NOTE — Progress Notes (Signed)
   Covid-19 Vaccination Clinic  Name:  Nicole Clements    MRN: [id number] DOB: [birth year]  01/18/2020  [name] was observed post Covid-19 immunization for 15 minutes without incident. She was provided with Vaccine Information Sheet and instruction to access the V-Safe system.   [name] was instructed to call 911 with any severe reactions post vaccine: [name] Difficulty breathing  . Swelling of face and throat  . A fast heartbeat  . A bad rash all over body  . Dizziness and weakness   Immunizations Administered    Name Date Dose VIS Date Route   Moderna COVID-19 Vaccine 01/18/2020 10:22 AM 0.5 mL 10/04/2019 Intramuscular   Manufacturer: Moderna   Lot: [id number]   NDC: 41962-229-79

## 2020-01-23 ENCOUNTER — Ambulatory Visit (INDEPENDENT_AMBULATORY_CARE_PROVIDER_SITE_OTHER): Payer: Medicare Other

## 2020-01-23 DIAGNOSIS — I5022 Chronic systolic (congestive) heart failure: Secondary | ICD-10-CM

## 2020-01-23 DIAGNOSIS — Z95810 Presence of automatic (implantable) cardiac defibrillator: Secondary | ICD-10-CM

## 2020-01-24 DIAGNOSIS — Z95810 Presence of automatic (implantable) cardiac defibrillator: Secondary | ICD-10-CM | POA: Insufficient documentation

## 2020-01-25 ENCOUNTER — Encounter: Payer: Self-pay | Admitting: Internal Medicine

## 2020-01-25 ENCOUNTER — Ambulatory Visit (INDEPENDENT_AMBULATORY_CARE_PROVIDER_SITE_OTHER): Payer: Medicare Other | Admitting: Internal Medicine

## 2020-01-25 ENCOUNTER — Other Ambulatory Visit: Payer: Self-pay

## 2020-01-25 VITALS — BP 132/70 | HR 84 | Ht 62.5 in | Wt 148.0 lb

## 2020-01-25 DIAGNOSIS — Z95810 Presence of automatic (implantable) cardiac defibrillator: Secondary | ICD-10-CM | POA: Diagnosis not present

## 2020-01-25 DIAGNOSIS — I255 Ischemic cardiomyopathy: Secondary | ICD-10-CM | POA: Diagnosis not present

## 2020-01-25 DIAGNOSIS — I5022 Chronic systolic (congestive) heart failure: Secondary | ICD-10-CM

## 2020-01-25 LAB — CUP PACEART INCLINIC DEVICE CHECK
Battery Remaining Longevity: 26 mo
Battery Voltage: 2.93 V
Brady Statistic AP VP Percent: 97.84 %
Brady Statistic AP VS Percent: 1.49 %
Brady Statistic AS VP Percent: 0.6 %
Brady Statistic AS VS Percent: 0.06 %
Brady Statistic RA Percent Paced: 90.21 %
Brady Statistic RV Percent Paced: 24.22 %
Date Time Interrogation Session: 20210324155900
HighPow Impedance: 61 Ohm
Implantable Lead Implant Date: 20160615
Implantable Lead Implant Date: 20160615
Implantable Lead Implant Date: 20160615
Implantable Lead Location: 753858
Implantable Lead Location: 753859
Implantable Lead Location: 753860
Implantable Lead Model: 4398
Implantable Lead Model: 5076
Implantable Pulse Generator Implant Date: 20160615
Lead Channel Impedance Value: 361 Ohm
Lead Channel Impedance Value: 361 Ohm
Lead Channel Impedance Value: 399 Ohm
Lead Channel Impedance Value: 418 Ohm
Lead Channel Impedance Value: 513 Ohm
Lead Channel Impedance Value: 513 Ohm
Lead Channel Impedance Value: 589 Ohm
Lead Channel Impedance Value: 608 Ohm
Lead Channel Impedance Value: 646 Ohm
Lead Channel Impedance Value: 646 Ohm
Lead Channel Impedance Value: 893 Ohm
Lead Channel Impedance Value: 893 Ohm
Lead Channel Impedance Value: 931 Ohm
Lead Channel Pacing Threshold Amplitude: 0.75 V
Lead Channel Pacing Threshold Amplitude: 0.75 V
Lead Channel Pacing Threshold Amplitude: 1.25 V
Lead Channel Pacing Threshold Pulse Width: 0.4 ms
Lead Channel Pacing Threshold Pulse Width: 0.4 ms
Lead Channel Pacing Threshold Pulse Width: 0.4 ms
Lead Channel Sensing Intrinsic Amplitude: 1.9 mV
Lead Channel Sensing Intrinsic Amplitude: 20 mV
Lead Channel Setting Pacing Amplitude: 1.5 V
Lead Channel Setting Pacing Amplitude: 2 V
Lead Channel Setting Pacing Amplitude: 2.25 V
Lead Channel Setting Pacing Pulse Width: 0.4 ms
Lead Channel Setting Pacing Pulse Width: 0.4 ms
Lead Channel Setting Sensing Sensitivity: 0.45 mV

## 2020-01-25 NOTE — Patient Instructions (Signed)
Medication Instructions:  Your physician recommends that you continue on your current medications as directed. Please refer to the Current Medication list given to you today.  Labwork: None ordered.  Testing/Procedures: None ordered.  Follow-Up: Your physician wants you to follow-up in: 12 months with Dr Ladona Ridgel in [place].  You will receive a reminder letter in the mail two months in advance. If you don't receive a letter, please call our office to schedule the follow-up appointment.  Remote monitoring is used to monitor your Pacemaker of ICD from home. This monitoring reduces the number of office visits required to check your device to one time per year. It allows [place] to keep an eye on the functioning of your device to ensure it is working properly.   Any Other Special Instructions Will Be Listed Below (If Applicable).  If you need a refill on your cardiac medications before your next appointment, please call your pharmacy.

## 2020-01-25 NOTE — Progress Notes (Signed)
I will be ready shortly I did have to remind myself which she has since it has been 4 years since I seen her     Patient Care Team: Mayra Neer, MD as PCP - General (Family Medicine) Harl Bowie, Alphonse Guild, MD as PCP - Cardiology (Cardiology) Lomax, Marny Lowenstein, MD (Inactive) (Obstetrics and Gynecology) Beverely Low, [place] as Referring Physician (Optometry) Danie Binder, MD (Gastroenterology) Verl Blalock, Marijo Conception, MD (Inactive) as Consulting Physician (Cardiology)   HPI  Nicole Clements is a 79 y.o. female Seen in follow-up for CRT ICD implanted for primary prevention 6/16 for left ventricular dysfunction. She has a history of ischemic heart disease with prior stenting of the RCA and RPDA.  Ejection fraction most recently 12/15 was 15-20%. Catheterization 9/15 demonstrated no obstructive disease and LV EF of about 30%.  Hospitalization 2 week after CRT-D implantation with an acute stroke.  She has history of frequent ventricular ectopy. Prior to proceeding with device implantation during evaluation in February, we undertook a Holter monitor which demonstrated 10% PVCs and we began  Antiarrhythmic trial with amiodarone with a significant reduction in PVCs--3%. She had a subsequent echo 3/16 demonstrating no interval improvement.  Amiodarone has subsequently been discontinued.  Denies chest pain shortness of breath or peripheral edema.  Feels really good.  DATE TEST EF   12/16 Echo  25-30 %          Date Cr K Hgb  5/19 2.1 5.3    10/20 1.35 4.7 12.1       Past Medical History:  Diagnosis Date  . AICD (automatic cardioverter/defibrillator) present   . CAD (coronary artery disease) April 2014   DES to PLA, occluded PDA 07/2014  . CHF (congestive heart failure) ([place])   . Hypercholesterolemia   . Hypertension   . Ischemic cardiomyopathy    LVEF 25%-45%  . IVCD (intraventricular conduction defect)   . PVC's (premature ventricular contractions)   . S/P colonoscopy August 2007   Hyperplastic polyps, rare sigmoid and descending colon diverticulosis, small internal hemorrhoids  . STEMI (ST elevation myocardial infarction) ([place]) 07/22/1996    Past Surgical History:  Procedure Laterality Date  . ABDOMINAL HYSTERECTOMY    . CARDIAC CATHETERIZATION  April 2014   Med Rx  . CATARACT EXTRACTION W/PHACO Right 09/15/2017   Procedure: CATARACT EXTRACTION PHACO AND INTRAOCULAR LENS PLACEMENT (IOC);  Surgeon: Rutherford Guys, MD;  Location: [facility];  Service: Ophthalmology;  Laterality: Right;  CDE: 12.22  . CATARACT EXTRACTION W/PHACO Left 09/29/2017   Procedure: CATARACT EXTRACTION PHACO AND INTRAOCULAR LENS PLACEMENT (IOC);  Surgeon: Rutherford Guys, MD;  Location: [facility];  Service: Ophthalmology;  Laterality: Left;  CDE: 10.02  . COLONOSCOPY N/A 12/24/2015   Dr. Fields:moderate sized internal hemorrhoids/moderate diverticulosis, negative microscopic colitis   . EP IMPLANTABLE DEVICE  04/18/2015   BV ICD  . EP IMPLANTABLE DEVICE N/A 04/18/2015   Procedure: BiV ICD Insertion CRT-D;  Surgeon: Deboraha Sprang, MD;  Location: [facility];  Service: Cardiovascular;  Laterality: N/A;  . LEFT AND RIGHT HEART CATHETERIZATION WITH CORONARY ANGIOGRAM N/A 07/17/2014   Procedure: LEFT AND RIGHT HEART CATHETERIZATION WITH CORONARY ANGIOGRAM;  Surgeon: Blane Ohara, MD;  Location: [facility] CATH LAB;  Service: Cardiovascular;  Laterality: N/A;  . PERCUTANEOUS CORONARY STENT INTERVENTION (PCI-S)  07/17/2014   Procedure: PERCUTANEOUS CORONARY STENT INTERVENTION (PCI-S);  Surgeon: Blane Ohara, MD;  Location: [facility] CATH LAB;  Service: Cardiovascular;;  Distal RCA  . S/P Hysterectomy  Current Outpatient Medications  Medication Sig Dispense Refill  . acetaminophen (TYLENOL) 500 MG tablet Take 1,000 mg by mouth every 8 (eight) hours as needed for mild pain or headache.    [name] aspirin EC 81 MG tablet Take 1 tablet (81 mg total) by mouth daily. 90 tablet 3  . carvedilol (COREG) 25 MG tablet TAKE 1  TABLET BY MOUTH TWICE DAILY 60 tablet 6  . Cholecalciferol (VITAMIN D3) 2000 UNITS capsule Take 2,000 Units by mouth daily.      . furosemide (LASIX) 20 MG tablet Take 20 mg daily. You may take an extra 20 mg daily as needed for swelling. 180 tablet 3  . losartan (COZAAR) 25 MG tablet TAKE 1 TABLET(25 MG) BY MOUTH DAILY 90 tablet 1  . nitroGLYCERIN (NITROSTAT) 0.4 MG SL tablet Place 1 tablet (0.4 mg total) under the tongue every 5 (five) minutes x 3 doses as needed for chest pain. 25 tablet 2  . Omega-3 Fatty Acids (FISH OIL) 1200 MG CAPS Take 1,200 mg by mouth daily.    . potassium chloride SA (KLOR-CON) 20 MEQ tablet Take 1 tablet (20 mEq total) by mouth daily. 90 tablet 2  . pravastatin (PRAVACHOL) 80 MG tablet Take 80 mg by mouth daily.    [name] spironolactone (ALDACTONE) 25 MG tablet TAKE 1/2 TABLET(12.5 MG) BY MOUTH TWICE DAILY 90 tablet 3   No current facility-administered medications for this visit.    Allergies  Allergen Reactions  . Lactose Intolerance (Gi) Other (See Comments)    GI Upset   . Sulfa Antibiotics Rash    Review of Systems negative except from HPI and PMH  Physical Exam BP 132/70   Pulse 84   Ht 5' 2.5" (1.588 m)   Wt 148 lb (67.1 kg)   SpO2 99%   BMI 26.64 kg/m  Well developed and well nourished in no acute distress HENT normal Neck supple with JVP-flat Clear Device pocket well healed; without hematoma or erythema.  There is no tethering  Regular rate and rhythm, no   murmur Abd-soft with active BS No Clubbing cyanosis   edema Skin-warm and dry A & Oriented  Grossly normal sensory and motor function  ECG  Sinus 84 17/14/43 QRS upright lead V1 and qrs in lead I  Ischemic//nonischemic Cardiomyopathy  CHF chronic Systolic class II  Ventricular Ectopy//PVCs and VT-Nonsustained  Left bundle branch block  Sinus bradycardia  ICD-CRT Medtronic    No intercurrent treated VT.  Continues with nonsustained VT.  Euvolemic continue current  meds  Without symptoms of ischemia  PVC burden remains of about 8.3% unchanged from the previous year and a half.    Heart rate excursion is somewhat limited threshold changed  medium-low--low.     We discussed echocardiogram to reassess LV function following CRT-D given the significant improvement in heart failure symptoms; however, is not sure would inform any changes in therapy and given the associated costs we will hold off.

## 2020-01-27 NOTE — Progress Notes (Signed)
EPIC Encounter for ICM Monitoring  Patient Name: Nicole Clements is a 79 y.o. female Date: 01/27/2020 Primary Care Physican: Lupita Raider, MD Primary Cardiologist:Branch Electrophysiologist: Graciela Husbands Nephrologist: Celene Skeen Pacing:95.5% 3/24/2021Office Weight: 148 lbs  Transmission reviewed.  Patient had OV with Dr Graciela Husbands on 3/24  Optivol thoracic impedancenormal.  Prescribed:  Furosemide 20 mg Take 20 mg daily. You may take an extra 20 mg daily as needed for swelling.  Potassium 20 mEq take 1 tabletdaily  Lab 10/22/2020Creatinine 1.57, BUN 23, Potassium 5.6, Sodium 127, [id number]  Recommendations: None  Follow-up plan: ICM clinic phone appointment on4/27/2021.91 day device clinic remote scheduled 04/03/2020.  Copy of ICM check sent to Dr.Klein.  3 month ICM trend: 01/23/2020   1 Year ICM trend:       Karie Soda, RN 01/27/2020 1:03 PM

## 2020-02-14 ENCOUNTER — Other Ambulatory Visit: Payer: Self-pay | Admitting: Cardiology

## 2020-02-28 ENCOUNTER — Ambulatory Visit (INDEPENDENT_AMBULATORY_CARE_PROVIDER_SITE_OTHER): Payer: Medicare Other

## 2020-02-28 DIAGNOSIS — Z95810 Presence of automatic (implantable) cardiac defibrillator: Secondary | ICD-10-CM

## 2020-02-28 DIAGNOSIS — I5022 Chronic systolic (congestive) heart failure: Secondary | ICD-10-CM

## 2020-03-02 ENCOUNTER — Telehealth: Payer: Self-pay

## 2020-03-02 NOTE — Progress Notes (Signed)
EPIC Encounter for ICM Monitoring  Patient Name: Nicole Clements is a 79 y.o. female Date: 03/02/2020 Primary Care Physican: Lupita Raider, MD Primary Cardiologist:Branch Electrophysiologist: Graciela Husbands Nephrologist: Celene Skeen Pacing:95.9% 3/24/2021Office Weight: 148 lbs  Attempted call to patient and unable to reach.  Left detailed message per DPR regarding transmission. Transmission reviewed.   Optivol thoracic impedancenormal.  Prescribed:  Furosemide 20 mg Take 20 mg daily. You may take an extra 20 mg daily as needed for swelling.  Potassium 20 mEq take 1 tabletdaily  Lab 10/22/2020Creatinine 1.57, BUN 23, Potassium 5.6, Sodium 127, [id number]  Recommendations: No changes and encouraged to call if experiencing any fluid symptoms.  Follow-up plan: ICM clinic phone appointment on6/12/2019.91 day device clinic remote scheduled 04/03/2020.  Office visit with Dr Wyline Mood 03/28/2020  Copy of ICM check sent to Dr.Klein.  3 month ICM trend: 02/28/2020    1 Year ICM trend:       Karie Soda, RN 03/02/2020 1:47 PM

## 2020-03-02 NOTE — Telephone Encounter (Signed)
Remote ICM transmission received.  Attempted call to patient regarding ICM remote transmission and left detailed message per DPR.  Advised to return call for any fluid symptoms or questions. Next ICM remote transmission scheduled 04/04/2020.     

## 2020-03-28 ENCOUNTER — Encounter: Payer: Self-pay | Admitting: Cardiology

## 2020-03-28 ENCOUNTER — Telehealth (INDEPENDENT_AMBULATORY_CARE_PROVIDER_SITE_OTHER): Payer: Medicare Other | Admitting: Cardiology

## 2020-03-28 VITALS — BP 136/59 | HR 86 | Ht 62.0 in | Wt 147.0 lb

## 2020-03-28 DIAGNOSIS — I2510 Atherosclerotic heart disease of native coronary artery without angina pectoris: Secondary | ICD-10-CM | POA: Diagnosis not present

## 2020-03-28 DIAGNOSIS — I255 Ischemic cardiomyopathy: Secondary | ICD-10-CM

## 2020-03-28 DIAGNOSIS — N183 Chronic kidney disease, stage 3 unspecified: Secondary | ICD-10-CM

## 2020-03-28 DIAGNOSIS — I5022 Chronic systolic (congestive) heart failure: Secondary | ICD-10-CM | POA: Diagnosis not present

## 2020-03-28 DIAGNOSIS — E782 Mixed hyperlipidemia: Secondary | ICD-10-CM | POA: Diagnosis not present

## 2020-03-28 NOTE — Progress Notes (Signed)
Virtual Visit via Telephone Note   This visit type was conducted due to national recommendations for restrictions regarding the COVID-19 Pandemic (e.g. social distancing) in an effort to limit this patient's exposure and mitigate transmission in our community.  Due to her co-morbid illnesses, this patient is at least at moderate risk for complications without adequate follow up.  This format is felt to be most appropriate for this patient at this time.  The patient did not have access to video technology/had technical difficulties with video requiring transitioning to audio format only (telephone).  All issues noted in this document were discussed and addressed.  No physical exam could be performed with this format.  Please refer to the patient's chart for her  consent to telehealth for [facility].   The patient was identified using 2 identifiers.  Date:  03/28/2020   ID:  Nicole Clements, DOB Nov 16, 1940, MRN [id number]  Patient Location: Home Provider Location: Office  PCP:  Mayra Neer, MD  Cardiologist:  Carlyle Dolly, MD  Electrophysiologist:  None   Evaluation Performed:  Follow-Up Visit  Chief Complaint:  Follow up  History of Present Illness:    Nicole Clements is a 79 y.o. female seen today for follow up of the following medical problems.   1. 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 Admit 07/2014 with CHF and troponin elevation - cath 07/2014, LM 30% distal, LAD mid 50%, LCX ostial 50% and mid 50%, small intermediate Korbin Mapps 70%. RCA diffuse 50%, distal RCA with ulcerated 90% lesion, PDA 80-90% too small for PCI. LVEF 30%. Received DES to distal RCA, post procedure the PDA became occluded.  - echo 07/2014 LVEF 20%, inferior akinesis, grade II diastolic dysfunction. Repeat echo 10/2014 LVEF remains 15-20%.  - 04/2015 CRT-D device placed by Dr Caryl Comes. Normal function Jan 2018.  10/2015 echo LVEF 25-30% -could not afford entresto, back on  losartan.    - no SOB/DOE. No LE edema - weights are stable around 145-147 lbs - compliant with meds   2. Mitral regurgitation - no recent symptoms  3. Hyperlipidemia - atorva was too expensive, changed to pravastatinby pcp  08/2019 TC 130 HDL 36 TG 97 LDL 73 - she is compliant with staitn  4. CKDIII -Working to limit nephrotoxic drugs - most recent labs 08/2019 showed Cr 1.5, trending down.       Has had covid vaccine x 2.     The patient does not have symptoms concerning for COVID-19 infection (fever, chills, cough, or new shortness of breath).    Past Medical History:  Diagnosis Date  . AICD (automatic cardioverter/defibrillator) present   . CAD (coronary artery disease) April 2014   DES to PLA, occluded PDA 07/2014  . CHF (congestive heart failure) ([name])   . Hypercholesterolemia   . Hypertension   . Ischemic cardiomyopathy    LVEF 25%-45%  . IVCD (intraventricular conduction defect)   . PVC's (premature ventricular contractions)   . S/P colonoscopy August 2007   Hyperplastic polyps, rare sigmoid and descending colon diverticulosis, small internal hemorrhoids  . STEMI (ST elevation myocardial infarction) ([name]) 07/22/1996   Past Surgical History:  Procedure Laterality Date  . ABDOMINAL HYSTERECTOMY    . CARDIAC CATHETERIZATION  April 2014   Med Rx  . CATARACT EXTRACTION W/PHACO Right 09/15/2017   Procedure: CATARACT EXTRACTION PHACO AND INTRAOCULAR LENS PLACEMENT (IOC);  Surgeon: Rutherford Guys, MD;  Location: [facility];  Service: Ophthalmology;  Laterality: Right;  CDE: 12.22  .  CATARACT EXTRACTION W/PHACO Left 09/29/2017   Procedure: CATARACT EXTRACTION PHACO AND INTRAOCULAR LENS PLACEMENT (IOC);  Surgeon: Rutherford Guys, MD;  Location: [facility];  Service: Ophthalmology;  Laterality: Left;  CDE: 10.02  . COLONOSCOPY N/A 12/24/2015   Dr. Fields:moderate sized internal hemorrhoids/moderate diverticulosis, negative microscopic colitis   . EP IMPLANTABLE  DEVICE  04/18/2015   BV ICD  . EP IMPLANTABLE DEVICE N/A 04/18/2015   Procedure: BiV ICD Insertion CRT-D;  Surgeon: Deboraha Sprang, MD;  Location: [facility];  Service: Cardiovascular;  Laterality: N/A;  . LEFT AND RIGHT HEART CATHETERIZATION WITH CORONARY ANGIOGRAM N/A 07/17/2014   Procedure: LEFT AND RIGHT HEART CATHETERIZATION WITH CORONARY ANGIOGRAM;  Surgeon: Blane Ohara, MD;  Location: [facility] CATH LAB;  Service: Cardiovascular;  Laterality: N/A;  . PERCUTANEOUS CORONARY STENT INTERVENTION (PCI-S)  07/17/2014   Procedure: PERCUTANEOUS CORONARY STENT INTERVENTION (PCI-S);  Surgeon: Blane Ohara, MD;  Location: [facility] C[facility] LAB;  Service: Cardiovascular;;  Distal RCA  . S/P Hysterectomy       No outpatient medications have been marked as taking for the 03/28/20 encounter (Appointment) with Arnoldo Lenis, MD.     Allergies:   Lactose intolerance (gi) and Sulfa antibiotics   Social History   Tobacco Use  . Smoking status: Current Every Day Smoker    Packs/day: 0.50    Years: 50.00    Pack years: 25.00    Types: Cigarettes    Start date: 02/26/1962  . Smokeless tobacco: Never Used  . Tobacco comment: down to less than 1/2 ppd  Substance Use Topics  . Alcohol use: No    Alcohol/week: 0.0 standard drinks  . Drug use: No     Family Hx: The patient's family history includes Diabetes Mellitus II in her brother, brother, brother, and sister; Hypertension in her brother, brother, brother, and sister. There is no history of Colon cancer.  ROS:   Please see the history of present illness.     All other systems reviewed and are negative.   Prior CV studies:   The following studies were reviewed today:  02/2013 Cath Procedural Findings:  Hemodynamics:  AO 116/45 with a mean of 70  LV 120/18  Coronary angiography:  Coronary dominance: right  Left mainstem: The left main is patent with 30% distal left main stenosis as the vessel trifurcates into the LAD,  intermediate Heidy Mccubbin, and left circumflex.  Left anterior descending (LAD): The LAD is patent with diffuse disease noted. There is minimal calcification present. The first diagonal is patent with 30-50% diffuse disease. The LAD after the first septal perforator has 40-50% stenosis. The vessel reaches the apex and wraps around the left ventricular apex without significant stenosis.  Left circumflex (LCx): The intermediate Dezmin Kittelson is small in caliber and diffusely diseased with 50-60% proximal vessel stenosis the AV groove circumflex is diffusely diseased with 50% ostial stenosis and 30% mid stenosis it supplies 2 small posterolateral Eren Puebla  Right coronary artery (RCA): The RCA is diffusely diseased. There is significant tortuosity, especially around the junction of the mid and distal vessel. The vessel is diffusely calcified. The mid vessel has tandem 50% stenoses. The distal vessel has 80-90 % stenosis just before the bifurcation of the PDA and posterior AV segment. The PDA and posterior AV segment with 3 posterolateral branches are patent.  Left ventriculography: Left ventricular systolic function is moderately depressed. There is severe hypokinesis of the basal and midinferior walls. The anterolateral and apical walls contract normally. The left  ventricular ejection fraction is estimated at 45%.  Abdominal aortogram: The abdominal aorta is patent. The renal arteries are single bilaterally and they are widely patent. There appears to be nonobstructive stenosis of the right common iliac artery at the aortoiliac junction. The left common iliac artery appears to have significant ostial stenosis estimated at at least 75% angiographically. The visualized portions of the external iliac and common femoral arteries are patent  Final Conclusions:  1. Severe single-vessel coronary artery disease involving the distal right coronary artery 2. Moderate LV dysfunction with segmental severe hypokinesis of the  inferior wall and normal contraction of the antero-apex with estimated left ventricular ejection fraction of 45%. 3. Severe left common iliac artery stenosis Recommendations: The patient has severe distal RCA disease. She has a known history of inferior wall infarction and remote PCA. This fits with that clinical history. She has nonobstructive disease of the left coronary artery. Her left ventricular ejection fraction by ventriculography is clearly greater than 35%. With her history of previous MI, I do not think PCI is indicated with an absence of angina. Will carefully discussed symptoms with the patient, but I am inclined to treat her medically. Will also review indication for consideration of peripheral intervention for her iliac disease.   01/2013 Echo Study Conclusions  - Left ventricle: The cavity size was mildly to moderately dilated. Wall thickness was normal. Systolic function was severely reduced. The estimated ejection fraction was 25%. Severe diffuse hypokinesis. - Aortic valve: Mildly calcified annulus. Trileaflet. - Mitral valve: Calcified annulus. - Left atrium: The atrium was moderately dilated. - Right ventricle: The cavity size was normal. Wall thickness was mildly to moderately increased. - Right atrium: The atrium was mildly dilated. - Atrial septum: No defect or patent foramen ovale was identified. Impressions:  - Compared to the prior study of 03/13/11, there has been substantial deterioration in LV systolic function.  07/2014 Echo Study Conclusions  - Left ventricle: The cavity size was severely dilated. Wall thickness was normal. The estimated ejection fraction was 20%. Diffuse hypokinesis. There is akinesis of the inferolateral and inferior myocardium. Features are consistent with a pseudonormal left ventricular filling pattern, with concomitant abnormal relaxation and increased filling pressure (grade 2 diastolic dysfunction). Doppler parameters  are consistent with high ventricular filling pressure. - Mitral valve: There was mild to moderate regurgitation. - Left atrium: The atrium was moderately dilated.  Impressions:  - Global hypokinesis; inferior and inferolateral akinesis; overall severely reduced LV function; grade 2 diastolic dysfunction with elevated left ventricular filling pressures, moderate LAE; mild to moderate MR. 07/2014 Cath PROCEDURAL FINDINGS  Hemodynamics:  AO 122/48  LV 118/14  RA 9  RV 41/11  PA 37/5 with a mean of 21  PCWP 27  Oxygen saturations  AO 90  PA 56  Cardiac output 3.5  Cardiac index 2.0  Coronary angiography:  Coronary dominance: right  Left mainstem: The left mainstem is patent with 30% distal left main stenosis.  Left anterior descending (LAD): The LAD is diffusely diseased in the midportion. There are sequential 50% stenoses, unchanged from previous study. The first diagonal Abe Schools is large in distribution with diffuse nonobstructive disease noted. The mid and distal LAD are patent without significant disease.  Left circumflex (LCx): The left circumflex has 50% ostial stenosis, 50% mid vessel stenosis, and to OM branches with no significant disease. There is an intermediate Kodi Guerrera is diffusely diseased with up to 70% stenosis. This vessel is small in caliber.  Right coronary artery (RCA): Dominant  vessel. Tortuous with diffuse nonobstructive stenosis throughout the proximal and mid portions. There are diffuse 50% stenosis present. The distal vessel has an ulcerated-appearing irregular 90% stenosis involving the bifurcation of the PDA and posterolateral branches. The posterolateral branches are patent with mild diffuse disease. The PDA arises an acute angulation with 30-40% stenosis. The mid body of the PDA has an 80-90% stenosis in an area where the vessel is too small for PCI.  Left ventriculography: The ventricle appears the synchronous. The basal and midinferior wall  are akinetic. The basal inferolateral wall is severely hypokinetic. The anterolateral wall and apex contract normally. The estimated LVEF is 30%.  PCI Procedure Note: Following the diagnostic procedure, the decision was made to proceed with PCI of the distal RCA. The lesion is complex, there is a large amount of myocardium involved. I felt there was a risk of potentially compromising the PDA Avril Busser, but overall in my opinion the risk/benefit was favorable for PCI considering the severe stenosis involving the entire RCA distribution. The sheath was upsized to a 6 [place]. Weight-based bivalirudin was given for anticoagulation. The patient was loaded with Plavix 600 mg on the table. Once a therapeutic ACT was achieved, a 6 [place] JR 4 guide catheter was inserted. A cougar coronary guidewire was used to cross the lesion into the PLA Kaliopi Blyden. The lesion was predilated with a 2.5 x 15 mm balloon. The lesion was then stented with a 3.0 by 32m Promus DES stent. The stent was postdilated with a 3.25 mm noncompliant balloon to 18 atmospheres. The PDA was stented across as the stent extended from the distal RCA proximal PLA Laquashia Mergenthaler. The PDA was totally occluded. I made a long attempt with a whisper wire using multiple bands on the tip of the wire, but all of these attempts were unsuccessful at rewiring the PDA. The patient was completely asymptomatic and specifically denied any chest pain. Her hemodynamics remained stable and there were no changes in her rhythm or blood pressure. A final image of the left coronary artery was obtained and this demonstrated a collateral from the LAD to the PDA Montie Gelardi. Following PCI, there was 0% residual stenosis and TIMI-3 flow. Final angiography confirmed an excellent result. Femoral hemostasis was achieved will be achieved with manual hemostasis. The patient tolerated the PCI procedure well. There were no immediate procedural complications. The patient was transferred to the post  catheterization recovery area for further monitoring.  PCI Data:  Vessel - RCA/Segment - distal  Percent Stenosis (pre) 90  TIMI-flow 3  Stent 3.0 by 183mPromus DES  Percent Stenosis (post) 0  TIMI-flow (post) 3  Contrast: 175 cc Omnipaque  Radiation dose/Fluoro time: 15.5 minutes  Estimated Blood Loss: Minimal  Final Conclusions:  1. Nonobstructive disease involving the LAD, left circumflex, and OM/diagonal side branches 2. Severe stenosis of the distal RCA bifurcation, treated successfully with a drug-eluting stent into the PLA Kamile Fassler with residual occlusion of the PDA Jeran Hiltz 3. Severe segmental LV systolic dysfunction Recommendations: Dual antiplatelet therapy with aspirin and Plavix for at least 12 months. Will cycle cardiac enzymes since the PDA Kassius Battiste is occluded after stenting.  10/2014 Echo Study Conclusions  - Procedure narrative: Transthoracic echocardiography. Image quality was suboptimal. The study was technically difficult, as a result of poor sound wave transmission and body habitus. - Left ventricle: Systolic function is severely reduced, estimated EF 15-20%. The cavity size was severely dilated. Doppler parameters are consistent with abnormal left ventricular relaxation (grade 1 diastolic dysfunction). Doppler parameters  are consistent with high ventricular filling pressure. Medial E/e&' 24. - Regional wall motion abnormality: Akinesis of the basal-mid inferior and basal inferolateral myocardium; severe hypokinesis of the apical inferior and mid inferolateral myocardium. Severe, diffuse hypokinesis. - Ventricular septum: Septal motion showed abnormal function and dyssynergy. These changes are consistent with intraventricular conduction delay. - Aortic valve: Mildly calcified annulus. Trileaflet; mildly thickened leaflets. - Mitral valve: Mildly dilated annulus. Mildly thickened leaflets . There was moderate  eccentric regurgitation. 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 - Left atrium: The atrium was severely dilated. Volume/bsa, S: 47.1 ml/m^2. - Right ventricle: Systolic function was mildly reduced. - Right atrium: The atrium was mildly dilated. - Atrial septum: There was increased thickness of the septum, consistent with lipomatous hypertrophy. - Tricuspid valve: There was mild regurgitation. - Pulmonary arteries: PA peak pressure: 42 mm Hg (S). Mildly elevated pulmonary pressures. - Pericardium, extracardiac: There was a small pericardial effusion, with no evidence of tamponade physiology.  04/2015 Carotid US IMPRESSION: 1. Mild (1-49%) stenosis of the proximal right internal carotid artery secondary to smooth heterogeneous atherosclerotic plaque. No interval change compared to recent prior imaging from earlier this month. 2. Mild (1-49%) stenosis of the proximal left internal carotid artery secondary to heterogeneous and irregular atherosclerotic plaque. No interval change compared to recent prior imaging from earlier this month. 3. Vertebral arteries remain patent with normal antegrade flow. Signed,    10/2015 echo Study Conclusions  - Left ventricle: The cavity size was moderately to severely dilated. Wall thickness was normal. Systolic function was severely reduced. The estimated ejection fraction was in the range of 25% to 30%. Diffuse hypokinesis. There is akinesis of the basalanteroseptal and anterior myocardium. Doppler parameters are consistent with abnormal left ventricular relaxation (grade 1 diastolic dysfunction). - Mitral valve: Calcified annulus. Mildly calcified leaflets . There was mild regurgitation. - Right atrium: Central venous pressure (est): 3 mm Hg. - Atrial septum: No defect or patent foramen ovale was identified. - Tricuspid valve: There was trivial regurgitation. - Pulmonary arteries: PA peak pressure: 27  mm Hg (S). - Pericardium, extracardiac: A prominent pericardial fat pad was present.  Impressions:  - Moderate to severe LV chamber dilatation with LVEF approximately 25-30%. There is diffuse hypokinesis with near akinesis of the basal anteroseptal and anterior myocardium. Compared to the previous study from June 2016 there has been some improvement in LVEF. Grade 1 diastolic dysfunction. MAC with mild mitral regurgitation. Trivial tricuspid regurgitation with PASP 27 mmHg.  Transthoracic echocardiography. M-mode, complete 2D, spectral Doppler, and color Doppler. Birthdate: Patient birthdate: 10/31/1941. Age: Patient is 79 yr old. Sex: Gender: female. BMI: 27.5 kg/m^2. Blood pressure:   98/47 Patient status: Inpatient. Study date: Study date: 10/04/2015. Study time: 11:27 AM. Location: Echo laboratory  Labs/Other Tests and Data Reviewed:    EKG:  No ECG reviewed.  Recent Labs: No results found for requested labs within last 8760 hours.   Recent Lipid Panel Lab Results  Component Value Date/Time   CHOL 138 04/30/2015 05:43 AM   TRIG 117 04/30/2015 05:43 AM   HDL 32 (L) 04/30/2015 05:43 AM   CHOLHDL 4.3 04/30/2015 05:43 AM   LDLCALC 83 04/30/2015 05:43 AM    Wt Readings from Last 3 Encounters:  01/25/20 148 lb (67.1 kg)  11/21/19 143 lb (64.9 kg)  07/20/19 142 lb (64.4 kg)     Objective:    Vital Signs:   Today's Vitals   03/28/20 0918  BP: (!) 136/59  Pulse: 86  Weight:  147 lb (66.7 kg)  Height: _0  (1.575 m)   Body mass index is 26.89 kg/m. Normal affect. Normal speech pattern and tone. Comfortbale, no apparent distress. No audible signs of sob or wheezing.   ASSESSMENT & PLAN:    1. CAD - doing well without symptoms, continue current meds   2. Chronic systolic HF - Medical therpay limited by soft bp's and renal insufficiency - no recent symptoms, continue currnet meds   3. Hyperlipidemia -statin  changedpreviouslydue to cost, - lipids have been at goal, continue current therapy.   4. CKD III -some improvement in Cr since last year - we will conitnue to monitor at this time, limit nephrotoxic meds  COVID-19 Education: The signs and symptoms of COVID-19 were discussed with the patient and how to seek care for testing (follow up with PCP or arrange E-visit).  The importance of social distancing was discussed today.  Time:   Today, I have spent 17 minutes with the patient with telehealth technology discussing the above problems.     Medication Adjustments/Labs and Tests Ordered: Current medicines are reviewed at length with the patient today.  Concerns regarding medicines are outlined above.   Tests Ordered: No orders of the defined types were placed in this encounter.   Medication Changes: No orders of the defined types were placed in this encounter.   Follow Up:  In Person in 6 month(s)  Signed, Carlyle Dolly, MD  03/28/2020 8:06 AM    [place]

## 2020-03-28 NOTE — Patient Instructions (Signed)
Medication Instructions:  Your physician recommends that you continue on your current medications as directed. Please refer to the Current Medication list given to you today.  *If you need a refill on your cardiac medications before your next appointment, please call your pharmacy*   Lab Work: None today If you have labs (blood work) drawn today and your tests are completely normal, you will receive your results only by: . MyChart Message (if you have MyChart) OR . A paper copy in the mail If you have any lab test that is abnormal or we need to change your treatment, we will call you to review the results.   Testing/Procedures: None today   Follow-Up: At [facility], you and your health needs are our priority.  As part of our continuing mission to provide you with exceptional heart care, we have created designated Provider Care Teams.  These Care Teams include your primary Cardiologist (physician) and Advanced Practice Providers (APPs -  Physician Assistants and Nurse Practitioners) who all work together to provide you with the care you need, when you need it.  We recommend signing up for the patient portal called "MyChart".  Sign up information is provided on this After Visit Summary.  MyChart is used to connect with patients for Virtual Visits (Telemedicine).  Patients are able to view lab/test results, encounter notes, upcoming appointments, etc.  Non-urgent messages can be sent to your provider as well.   To learn more about what you can do with MyChart, go to [email or web address].    Your next appointment:   6 month(s)  The format for your next appointment:   In Person  Provider:   Jonathan Branch, MD   Other Instructions None       Thank you for choosing [facility] !         

## 2020-04-03 ENCOUNTER — Ambulatory Visit (INDEPENDENT_AMBULATORY_CARE_PROVIDER_SITE_OTHER): Payer: Medicare Other | Admitting: *Deleted

## 2020-04-03 DIAGNOSIS — I5022 Chronic systolic (congestive) heart failure: Secondary | ICD-10-CM

## 2020-04-03 DIAGNOSIS — I4729 Other ventricular tachycardia: Secondary | ICD-10-CM

## 2020-04-03 DIAGNOSIS — I472 Ventricular tachycardia: Secondary | ICD-10-CM | POA: Diagnosis not present

## 2020-04-04 ENCOUNTER — Ambulatory Visit (INDEPENDENT_AMBULATORY_CARE_PROVIDER_SITE_OTHER): Payer: Medicare Other

## 2020-04-04 DIAGNOSIS — I5022 Chronic systolic (congestive) heart failure: Secondary | ICD-10-CM | POA: Diagnosis not present

## 2020-04-04 DIAGNOSIS — Z95810 Presence of automatic (implantable) cardiac defibrillator: Secondary | ICD-10-CM | POA: Diagnosis not present

## 2020-04-04 LAB — CUP PACEART REMOTE DEVICE CHECK
Battery Remaining Longevity: 24 mo
Battery Voltage: 2.92 V
Brady Statistic AP VP Percent: 98.13 %
Brady Statistic AP VS Percent: 1.59 %
Brady Statistic AS VP Percent: 0.23 %
Brady Statistic AS VS Percent: 0.05 %
Brady Statistic RA Percent Paced: 94.55 %
Brady Statistic RV Percent Paced: 28.87 %
Date Time Interrogation Session: 20210601062207
HighPow Impedance: 63 Ohm
Implantable Lead Implant Date: 20160615
Implantable Lead Implant Date: 20160615
Implantable Lead Implant Date: 20160615
Implantable Lead Location: 753858
Implantable Lead Location: 753859
Implantable Lead Location: 753860
Implantable Lead Model: 4398
Implantable Lead Model: 5076
Implantable Pulse Generator Implant Date: 20160615
Lead Channel Impedance Value: 1007 Ohm
Lead Channel Impedance Value: 1026 Ohm
Lead Channel Impedance Value: 1064 Ohm
Lead Channel Impedance Value: 361 Ohm
Lead Channel Impedance Value: 418 Ohm
Lead Channel Impedance Value: 456 Ohm
Lead Channel Impedance Value: 456 Ohm
Lead Channel Impedance Value: 456 Ohm
Lead Channel Impedance Value: 532 Ohm
Lead Channel Impedance Value: 589 Ohm
Lead Channel Impedance Value: 703 Ohm
Lead Channel Impedance Value: 722 Ohm
Lead Channel Impedance Value: 760 Ohm
Lead Channel Pacing Threshold Amplitude: 0.625 V
Lead Channel Pacing Threshold Amplitude: 0.625 V
Lead Channel Pacing Threshold Amplitude: 1.25 V
Lead Channel Pacing Threshold Pulse Width: 0.4 ms
Lead Channel Pacing Threshold Pulse Width: 0.4 ms
Lead Channel Pacing Threshold Pulse Width: 0.4 ms
Lead Channel Sensing Intrinsic Amplitude: 14.5 mV
Lead Channel Sensing Intrinsic Amplitude: 14.5 mV
Lead Channel Sensing Intrinsic Amplitude: 2.125 mV
Lead Channel Sensing Intrinsic Amplitude: 2.125 mV
Lead Channel Setting Pacing Amplitude: 1.5 V
Lead Channel Setting Pacing Amplitude: 2 V
Lead Channel Setting Pacing Amplitude: 2.25 V
Lead Channel Setting Pacing Pulse Width: 0.4 ms
Lead Channel Setting Pacing Pulse Width: 0.4 ms
Lead Channel Setting Sensing Sensitivity: 0.45 mV

## 2020-04-04 NOTE — Progress Notes (Signed)
Remote ICD transmission.   

## 2020-04-06 ENCOUNTER — Telehealth: Payer: Self-pay

## 2020-04-06 NOTE — Telephone Encounter (Signed)
Remote ICM transmission received.  Attempted call to patient regarding ICM remote transmission and no answer or voice mail box set up ? ?

## 2020-04-06 NOTE — Progress Notes (Signed)
EPIC Encounter for ICM Monitoring  Patient Name: Nicole Clements is a 79 y.o. female Date: 04/06/2020 Primary Care Physican: Lupita Raider, MD Primary Cardiologist:Branch Electrophysiologist: Graciela Husbands Nephrologist: Celene Skeen Pacing:94.8% 5/26/2021OfficeWeight: 147lbs  Attempted call to patient and unable to reach.  Left detailed message per DPR regarding transmission. Transmission reviewed.   Optivol thoracic impedancenormal.  Prescribed:  Furosemide 20 mgTake 20 mg daily. You may take an extra 20 mg daily as needed for swelling.  Potassium 20 mEq take 1 tabletdaily  Lab 10/22/2020Creatinine 1.57, BUN 23, Potassium 5.6, Sodium 127, [id number]  Recommendations: No changes and encouraged to call if experiencing any fluid symptoms.  Follow-up plan: ICM clinic phone appointment on7/10/2020.91 day device clinic remote scheduled 07/02/2020.    Copy of ICM check sent to Dr.Klein.  3 month ICM trend: 04/03/2020    1 Year ICM trend:       Karie Soda, RN 04/06/2020 1:36 PM

## 2020-04-10 DIAGNOSIS — Z961 Presence of intraocular lens: Secondary | ICD-10-CM | POA: Diagnosis not present

## 2020-05-14 ENCOUNTER — Ambulatory Visit (INDEPENDENT_AMBULATORY_CARE_PROVIDER_SITE_OTHER): Payer: Medicare Other

## 2020-05-14 DIAGNOSIS — Z95810 Presence of automatic (implantable) cardiac defibrillator: Secondary | ICD-10-CM | POA: Diagnosis not present

## 2020-05-14 DIAGNOSIS — I5022 Chronic systolic (congestive) heart failure: Secondary | ICD-10-CM

## 2020-05-16 NOTE — Progress Notes (Signed)
EPIC Encounter for ICM Monitoring  Patient Name: Nicole Clements is a 79 y.o. female Date: 05/16/2020 Primary Care Physican: Lupita Raider, MD Primary Cardiologist:Branch Electrophysiologist: Graciela Husbands Nephrologist: Celene Skeen Pacing:94.5% 7/14/2021Weight: 145lbs  Spoke with patient and reports feeling well at this time.  Denies fluid symptoms.    Optivol thoracic impedancenormal.  Prescribed:  Furosemide 20 mgTake 20 mg daily. You may take an extra 20 mg daily as needed for swelling.  Potassium 20 mEq take 1 tabletdaily  Lab 10/22/2020Creatinine 1.57, BUN 23, Potassium 5.6, Sodium 127, [id number]  Recommendations: No changes and encouraged to call if experiencing any fluid symptoms.  Follow-up plan: ICM clinic phone appointment on8/16/2021.91 day device clinic remote scheduled 07/02/2020.  EP/Cardiology Office Visits: 09/19/2020 with Dr. Wyline Mood.    Copy of ICM check sent to Dr. Graciela Husbands.   3 month ICM trend: 05/14/2020    1 Year ICM trend:       Karie Soda, RN 05/16/2020 4:29 PM

## 2020-06-18 ENCOUNTER — Ambulatory Visit (INDEPENDENT_AMBULATORY_CARE_PROVIDER_SITE_OTHER): Payer: Medicare Other

## 2020-06-18 DIAGNOSIS — Z95810 Presence of automatic (implantable) cardiac defibrillator: Secondary | ICD-10-CM | POA: Diagnosis not present

## 2020-06-18 DIAGNOSIS — I5022 Chronic systolic (congestive) heart failure: Secondary | ICD-10-CM

## 2020-06-20 NOTE — Progress Notes (Signed)
EPIC Encounter for ICM Monitoring  Patient Name: Nicole Clements is a 79 y.o. female Date: 06/20/2020 Primary Care Physican: Lupita Raider, MD Primary Cardiologist:Branch Electrophysiologist: Graciela Husbands Nephrologist: Celene Skeen Pacing:90.6% 8/18/2021Weight: 145lbs  Spoke with patient and reports feeling well at this time.  Denies fluid symptoms.    Optivol thoracic impedancenormal.  Prescribed:  Furosemide 20 mgTake 20 mg daily. You may take an extra 20 mg daily as needed for swelling.  Potassium 20 mEq take 1 tabletdaily  Lab 10/22/2020Creatinine 1.57, BUN 23, Potassium 5.6, Sodium 127, [id number]  Recommendations: No changes and encouraged to call if experiencing any fluid symptoms.  Follow-up plan: ICM clinic phone appointment on9/20/2021.91 day device clinic remote scheduled8/30/2021.  EP/Cardiology Office Visits: 09/19/2020 with Dr. Wyline Mood.    Copy of ICM check sent to Dr. Graciela Husbands.   3 month ICM trend: 06/18/2020    1 Year ICM trend:       Karie Soda, RN 06/20/2020 4:21 PM

## 2020-07-02 ENCOUNTER — Ambulatory Visit (INDEPENDENT_AMBULATORY_CARE_PROVIDER_SITE_OTHER): Payer: Medicare Other | Admitting: *Deleted

## 2020-07-02 DIAGNOSIS — I255 Ischemic cardiomyopathy: Secondary | ICD-10-CM

## 2020-07-02 LAB — CUP PACEART REMOTE DEVICE CHECK
Battery Remaining Longevity: 22 mo
Battery Voltage: 2.92 V
Brady Statistic AP VP Percent: 98.33 %
Brady Statistic AP VS Percent: 1.6 %
Brady Statistic AS VP Percent: 0.06 %
Brady Statistic AS VS Percent: 0.01 %
Brady Statistic RA Percent Paced: 92.43 %
Brady Statistic RV Percent Paced: 33.31 %
Date Time Interrogation Session: 20210830033523
HighPow Impedance: 74 Ohm
Implantable Lead Implant Date: 20160615
Implantable Lead Implant Date: 20160615
Implantable Lead Implant Date: 20160615
Implantable Lead Location: 753858
Implantable Lead Location: 753859
Implantable Lead Location: 753860
Implantable Lead Model: 4398
Implantable Lead Model: 5076
Implantable Pulse Generator Implant Date: 20160615
Lead Channel Impedance Value: 1007 Ohm
Lead Channel Impedance Value: 1083 Ohm
Lead Channel Impedance Value: 361 Ohm
Lead Channel Impedance Value: 418 Ohm
Lead Channel Impedance Value: 475 Ohm
Lead Channel Impedance Value: 513 Ohm
Lead Channel Impedance Value: 513 Ohm
Lead Channel Impedance Value: 513 Ohm
Lead Channel Impedance Value: 589 Ohm
Lead Channel Impedance Value: 665 Ohm
Lead Channel Impedance Value: 722 Ohm
Lead Channel Impedance Value: 760 Ohm
Lead Channel Impedance Value: 950 Ohm
Lead Channel Pacing Threshold Amplitude: 0.625 V
Lead Channel Pacing Threshold Amplitude: 0.625 V
Lead Channel Pacing Threshold Amplitude: 1.5 V
Lead Channel Pacing Threshold Pulse Width: 0.4 ms
Lead Channel Pacing Threshold Pulse Width: 0.4 ms
Lead Channel Pacing Threshold Pulse Width: 0.4 ms
Lead Channel Sensing Intrinsic Amplitude: 1 mV
Lead Channel Sensing Intrinsic Amplitude: 1 mV
Lead Channel Sensing Intrinsic Amplitude: 15 mV
Lead Channel Sensing Intrinsic Amplitude: 15 mV
Lead Channel Setting Pacing Amplitude: 1.5 V
Lead Channel Setting Pacing Amplitude: 2 V
Lead Channel Setting Pacing Amplitude: 2.5 V
Lead Channel Setting Pacing Pulse Width: 0.4 ms
Lead Channel Setting Pacing Pulse Width: 0.4 ms
Lead Channel Setting Sensing Sensitivity: 0.45 mV

## 2020-07-04 NOTE — Progress Notes (Signed)
Remote ICD transmission.   

## 2020-07-23 ENCOUNTER — Ambulatory Visit (INDEPENDENT_AMBULATORY_CARE_PROVIDER_SITE_OTHER): Payer: Medicare Other

## 2020-07-23 DIAGNOSIS — I5022 Chronic systolic (congestive) heart failure: Secondary | ICD-10-CM

## 2020-07-23 DIAGNOSIS — Z95810 Presence of automatic (implantable) cardiac defibrillator: Secondary | ICD-10-CM

## 2020-07-23 NOTE — Progress Notes (Signed)
EPIC Encounter for ICM Monitoring  Patient Name: Nicole Clements is a 79 y.o. female Date: 07/23/2020 Primary Care Physican: Lupita Raider, MD Primary Cardiologist:Branch Electrophysiologist: Graciela Husbands Nephrologist: Celene Skeen Pacing:84.8% 9/20/2021Weight: 148lbs  Spoke with patient.  She reports weight gain of about 3 lbs.  She self adjusted Furosemide dosage to every other day instead of daily as prescribed.  Optivol thoracic impedancesuggesting possible fluid accumulation since 07/02/2020.  Prescribed:  Furosemide 20 mgTake 20 mg daily. You may take an extra 20 mg daily as needed for swelling.  Potassium 20 mEq take 1 tabletdaily  Lab 10/22/2020Creatinine 1.57, BUN 23, Potassium 5.6, Sodium 127, [id number]  Recommendations:Advised to take Furosemide 2 tablets (40 mg total) x 2 days and then returned to prescribed dosage of 1 tablet daily.   Follow-up plan: ICM clinic phone appointment on9/27/2021 to recheck fluid levels.91 day device clinic remote scheduled8/30/2021.  EP/Cardiology Office Visits:09/19/2020 with Dr.Branch.   Copy of ICM check sent to Dr.Klein and Dr Wyline Mood for [facility].   3 month ICM trend: 07/23/2020    1 Year ICM trend:       Karie Soda, RN 07/23/2020 5:05 PM

## 2020-07-24 ENCOUNTER — Other Ambulatory Visit (HOSPITAL_COMMUNITY): Payer: Self-pay | Admitting: Family Medicine

## 2020-07-24 DIAGNOSIS — Z1231 Encounter for screening mammogram for malignant neoplasm of breast: Secondary | ICD-10-CM

## 2020-07-30 ENCOUNTER — Ambulatory Visit (INDEPENDENT_AMBULATORY_CARE_PROVIDER_SITE_OTHER): Payer: Medicare Other

## 2020-07-30 DIAGNOSIS — I5022 Chronic systolic (congestive) heart failure: Secondary | ICD-10-CM

## 2020-07-30 DIAGNOSIS — Z95810 Presence of automatic (implantable) cardiac defibrillator: Secondary | ICD-10-CM

## 2020-07-31 ENCOUNTER — Telehealth: Payer: Self-pay

## 2020-07-31 NOTE — Progress Notes (Signed)
EPIC Encounter for ICM Monitoring  Patient Name: Nicole Clements is a 79 y.o. female Date: 07/31/2020 Primary Care Physican: Lupita Raider, MD Electrophysiologist: Graciela Husbands Nephrologist: Celene Skeen Pacing:88.1% 9/20/2021Weight: 148lbs  Attempted call to patient and unable to reach.   Transmission reviewed.   Optivol thoracic impedancesuggesting fluid levels returned to normal after taking extra Furosemide.  Prescribed:  Furosemide 20 mgTake 20 mg daily. You may take an extra 20 mg daily as needed for swelling.  Potassium 20 mEq take 1 tabletdaily  Lab 10/22/2020Creatinine 1.57, BUN 23, Potassium 5.6, Sodium 127, [id number]  Recommendations: Unable to reach.    Follow-up plan: ICM clinic phone appointment on10/25/2021.91 day device clinic remote scheduled11/29/2021.  EP/Cardiology Office Visits:09/19/2020 with Dr.Branch.   Copy of ICM check sent to Dr.Klein  3 month ICM trend: 07/30/2020    1 Year ICM trend:       Karie Soda, RN 07/31/2020 1:32 PM

## 2020-07-31 NOTE — Telephone Encounter (Signed)
Remote ICM transmission received.  Attempted call to patient regarding ICM remote transmission and no answer or answering machine. 

## 2020-08-06 ENCOUNTER — Other Ambulatory Visit: Payer: Self-pay

## 2020-08-06 ENCOUNTER — Ambulatory Visit (HOSPITAL_COMMUNITY)
Admission: RE | Admit: 2020-08-06 | Discharge: 2020-08-06 | Disposition: A | Discharge status: Home / Self Care | Payer: Medicare Other | Source: Ambulatory Visit | Attending: Family Medicine | Admitting: Family Medicine

## 2020-08-06 DIAGNOSIS — Z1231 Encounter for screening mammogram for malignant neoplasm of breast: Secondary | ICD-10-CM | POA: Diagnosis not present

## 2020-08-15 ENCOUNTER — Other Ambulatory Visit: Payer: Self-pay | Admitting: Cardiology

## 2020-08-27 ENCOUNTER — Ambulatory Visit (INDEPENDENT_AMBULATORY_CARE_PROVIDER_SITE_OTHER): Payer: Medicare Other

## 2020-08-27 DIAGNOSIS — I5022 Chronic systolic (congestive) heart failure: Secondary | ICD-10-CM | POA: Diagnosis not present

## 2020-08-27 DIAGNOSIS — Z95810 Presence of automatic (implantable) cardiac defibrillator: Secondary | ICD-10-CM | POA: Diagnosis not present

## 2020-08-31 NOTE — Progress Notes (Signed)
EPIC Encounter for ICM Monitoring  Patient Name: [name] is a 79 y.o. female Date: 08/31/2020 Primary Care Physican: Lupita Raider, MD Primary Cardiologist:Branch Electrophysiologist: Graciela Husbands Nephrologist: Celene Skeen Pacing:97% 9/20/2021Weight: 148lbs  Spoke with patient and reports feeling well at this time.  Denies fluid symptoms.    Optivol thoracic impedancesuggesting fluid level is normal.  Prescribed:  Furosemide 20 mgTake 20 mg daily. You may take an extra 20 mg daily as needed for swelling.  Potassium 20 mEq take 1 tabletdaily  Lab 10/22/2020Creatinine 1.57, BUN 23, Potassium 5.6, Sodium 127, [id number]  Recommendations: No changes and encouraged to call if experiencing any fluid symptoms.  Follow-up plan: ICM clinic phone appointment on11/30/2021.91 day device clinic remote scheduled11/29/2021.  EP/Cardiology Office Visits:09/19/2020 with Dr.Branch.   Copy of ICM check sent to Dr.Klein   3 month ICM trend: 08/27/2020    1 Year ICM trend:       Karie Soda, RN 08/31/2020 1:07 PM

## 2020-09-03 DIAGNOSIS — Z23 Encounter for immunization: Secondary | ICD-10-CM | POA: Diagnosis not present

## 2020-09-17 ENCOUNTER — Other Ambulatory Visit: Payer: Self-pay | Admitting: Cardiology

## 2020-09-19 ENCOUNTER — Ambulatory Visit (INDEPENDENT_AMBULATORY_CARE_PROVIDER_SITE_OTHER): Payer: Medicare Other | Admitting: Cardiology

## 2020-09-19 ENCOUNTER — Encounter: Payer: Self-pay | Admitting: Cardiology

## 2020-09-19 ENCOUNTER — Other Ambulatory Visit: Payer: Self-pay

## 2020-09-19 VITALS — BP 94/48 | HR 90 | Ht 62.0 in | Wt 146.0 lb

## 2020-09-19 DIAGNOSIS — I2510 Atherosclerotic heart disease of native coronary artery without angina pectoris: Secondary | ICD-10-CM | POA: Diagnosis not present

## 2020-09-19 DIAGNOSIS — E782 Mixed hyperlipidemia: Secondary | ICD-10-CM | POA: Diagnosis not present

## 2020-09-19 DIAGNOSIS — I255 Ischemic cardiomyopathy: Secondary | ICD-10-CM | POA: Diagnosis not present

## 2020-09-19 DIAGNOSIS — I5022 Chronic systolic (congestive) heart failure: Secondary | ICD-10-CM

## 2020-09-19 NOTE — Progress Notes (Signed)
Clinical Summary Nicole Clements is a 79 y.o.female seen today for follow up of the following medical problems.   1. 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 Admit 07/2014 with CHF and troponin elevation - cath 07/2014, LM 30% distal, LAD mid 50%, LCX ostial 50% and mid 50%, small intermediate Lotus Santillo 70%. RCA diffuse 50%, distal RCA with ulcerated 90% lesion, PDA 80-90% too small for PCI. LVEF 30%. Received DES to distal RCA, post procedure the PDA became occluded.  - echo 07/2014 LVEF 20%, inferior akinesis, grade II diastolic dysfunction. Repeat echo 10/2014 LVEF remains 15-20%.  - 04/2015 CRT-D device placed by Dr Caryl Comes. Normal function Jan 2018.  10/2015 echo LVEF 25-30% -could not afford entresto, back on losartan.    - no recent LE edema, no SOB or DOE - compliant with meds - normal ICD check 06/2020   2. Hyperlipidemia - atorva was too expensive, changed to pravastatinby pcp  08/2019 TC 130 HDL 36 TG 97 LDL 73 - labs followed by pcp, upcoming labs with pcp  3. CKDIII -Working to limit nephrotoxic drugs -most recent labs 08/2019 showed Cr 1.5, trending down.      Has had covid vaccine x 3, moderna.   Past Medical History:  Diagnosis Date  . AICD (automatic cardioverter/defibrillator) present   . CAD (coronary artery disease) April 2014   DES to PLA, occluded PDA 07/2014  . CHF (congestive heart failure) ([place])   . Hypercholesterolemia   . Hypertension   . Ischemic cardiomyopathy    LVEF 25%-45%  . IVCD (intraventricular conduction defect)   . PVC's (premature ventricular contractions)   . S/P colonoscopy August 2007   Hyperplastic polyps, rare sigmoid and descending colon diverticulosis, small internal hemorrhoids  . STEMI (ST elevation myocardial infarction) ([place]) 07/22/1996     Allergies  Allergen Reactions  . Lactose Intolerance (Gi) Other (See Comments)    GI Upset   . Sulfa Antibiotics Rash     Current Outpatient  Medications  Medication Sig Dispense Refill  . acetaminophen (TYLENOL) 500 MG tablet Take 1,000 mg by mouth every 8 (eight) hours as needed for mild pain or headache.    [name] aspirin EC 81 MG tablet Take 1 tablet (81 mg total) by mouth daily. 90 tablet 3  . carvedilol (COREG) 25 MG tablet TAKE 1 TABLET BY MOUTH TWICE DAILY 60 tablet 6  . Cholecalciferol (VITAMIN D3) 2000 UNITS capsule Take 2,000 Units by mouth daily.      . furosemide (LASIX) 20 MG tablet Take 20 mg daily. You may take an extra 20 mg daily as needed for swelling. 180 tablet 3  . losartan (COZAAR) 25 MG tablet TAKE 1 TABLET(25 MG) BY MOUTH DAILY 90 tablet 1  . nitroGLYCERIN (NITROSTAT) 0.4 MG SL tablet Place 1 tablet (0.4 mg total) under the tongue every 5 (five) minutes x 3 doses as needed for chest pain. 25 tablet 2  . Omega-3 Fatty Acids (FISH OIL) 1200 MG CAPS Take 1,200 mg by mouth daily.    . potassium chloride SA (KLOR-CON) 20 MEQ tablet Take 1 tablet (20 mEq total) by mouth daily. 90 tablet 2  . pravastatin (PRAVACHOL) 80 MG tablet Take 80 mg by mouth daily.    [name] spironolactone (ALDACTONE) 25 MG tablet TAKE 1/2 TABLET(12.5 MG) BY MOUTH TWICE DAILY 90 tablet 3   No current facility-administered medications for this visit.     Past Surgical History:  Procedure Laterality Date  . ABDOMINAL HYSTERECTOMY    .  CARDIAC CATHETERIZATION  April 2014   Med Rx  . CATARACT EXTRACTION W/PHACO Right 09/15/2017   Procedure: CATARACT EXTRACTION PHACO AND INTRAOCULAR LENS PLACEMENT (IOC);  Surgeon: Rutherford Guys, MD;  Location: [facility];  Service: Ophthalmology;  Laterality: Right;  CDE: 12.22  . CATARACT EXTRACTION W/PHACO Left 09/29/2017   Procedure: CATARACT EXTRACTION PHACO AND INTRAOCULAR LENS PLACEMENT (IOC);  Surgeon: Rutherford Guys, MD;  Location: [facility];  Service: Ophthalmology;  Laterality: Left;  CDE: 10.02  . COLONOSCOPY N/A 12/24/2015   Dr. Fields:moderate sized internal hemorrhoids/moderate diverticulosis, negative  microscopic colitis   . EP IMPLANTABLE DEVICE  04/18/2015   BV ICD  . EP IMPLANTABLE DEVICE N/A 04/18/2015   Procedure: BiV ICD Insertion CRT-D;  Surgeon: Deboraha Sprang, MD;  Location: [facility];  Service: Cardiovascular;  Laterality: N/A;  . LEFT AND RIGHT HEART CATHETERIZATION WITH CORONARY ANGIOGRAM N/A 07/17/2014   Procedure: LEFT AND RIGHT HEART CATHETERIZATION WITH CORONARY ANGIOGRAM;  Surgeon: Blane Ohara, MD;  Location: [facility] CATH LAB;  Service: Cardiovascular;  Laterality: N/A;  . PERCUTANEOUS CORONARY STENT INTERVENTION (PCI-S)  07/17/2014   Procedure: PERCUTANEOUS CORONARY STENT INTERVENTION (PCI-S);  Surgeon: Blane Ohara, MD;  Location: [facility] CATH LAB;  Service: Cardiovascular;;  Distal RCA  . S/P Hysterectomy       Allergies  Allergen Reactions  . Lactose Intolerance (Gi) Other (See Comments)    GI Upset   . Sulfa Antibiotics Rash      Family History  Problem Relation Age of Onset  . Hypertension Sister   . Diabetes Mellitus II Sister   . Hypertension Brother   . Diabetes Mellitus II Brother   . Hypertension Brother   . Diabetes Mellitus II Brother   . Hypertension Brother   . Diabetes Mellitus II Brother   . Colon cancer Neg Hx      Social History Nicole Clements reports that she has been smoking cigarettes. She started smoking about 58 years ago. She has a 25.00 pack-year smoking history. She has never used smokeless tobacco. Nicole Clements reports no history of alcohol use.   Review of Systems CONSTITUTIONAL: No weight loss, fever, chills, weakness or fatigue.  HEENT: Eyes: No visual loss, blurred vision, double vision or yellow sclerae.No hearing loss, sneezing, congestion, runny nose or sore throat.  SKIN: No rash or itching.  CARDIOVASCULAR: per hpi RESPIRATORY: No shortness of breath, cough or sputum.  GASTROINTESTINAL: No anorexia, nausea, vomiting or diarrhea. No abdominal pain or blood.  GENITOURINARY: No burning on urination, no  polyuria NEUROLOGICAL: No headache, dizziness, syncope, paralysis, ataxia, numbness or tingling in the extremities. No change in bowel or bladder control.  MUSCULOSKELETAL: No muscle, back pain, joint pain or stiffness.  LYMPHATICS: No enlarged nodes. No history of splenectomy.  PSYCHIATRIC: No history of depression or anxiety.  ENDOCRINOLOGIC: No reports of sweating, cold or heat intolerance. No polyuria or polydipsia.  [name]   Physical Examination Today's Vitals   09/19/20 1256  BP: (!) 94/48  Pulse: 90  SpO2: 98%  Weight: 146 lb (66.2 kg)  Height: _0  (1.575 m)   Body mass index is 26.7 kg/m.  Gen: resting comfortably, no acute distress HEENT: no scleral icterus, pupils equal round and reactive, no palptable cervical adenopathy,  CV: RRR, no m/r/g ,no jvd Resp: Clear to auscultation bilaterally GI: abdomen is soft, non-tender, non-distended, normal bowel sounds, no hepatosplenomegaly MSK: extremities are warm, no edema.  Skin: warm, no rash Neuro:  no focal deficits Psych: appropriate  affect   Diagnostic Studies  02/2013 Cath Procedural Findings:  Hemodynamics:  AO 116/45 with a mean of 70  LV 120/18  Coronary angiography:  Coronary dominance: right  Left mainstem: The left main is patent with 30% distal left main stenosis as the vessel trifurcates into the LAD, intermediate Jennah Satchell, and left circumflex.  Left anterior descending (LAD): The LAD is patent with diffuse disease noted. There is minimal calcification present. The first diagonal is patent with 30-50% diffuse disease. The LAD after the first septal perforator has 40-50% stenosis. The vessel reaches the apex and wraps around the left ventricular apex without significant stenosis.  Left circumflex (LCx): The intermediate Danelly Hassinger is small in caliber and diffusely diseased with 50-60% proximal vessel stenosis the AV groove circumflex is diffusely diseased with 50% ostial stenosis and 30% mid stenosis it  supplies 2 small posterolateral Remedy Corporan  Right coronary artery (RCA): The RCA is diffusely diseased. There is significant tortuosity, especially around the junction of the mid and distal vessel. The vessel is diffusely calcified. The mid vessel has tandem 50% stenoses. The distal vessel has 80-90 % stenosis just before the bifurcation of the PDA and posterior AV segment. The PDA and posterior AV segment with 3 posterolateral branches are patent.  Left ventriculography: Left ventricular systolic function is moderately depressed. There is severe hypokinesis of the basal and midinferior walls. The anterolateral and apical walls contract normally. The left ventricular ejection fraction is estimated at 45%.  Abdominal aortogram: The abdominal aorta is patent. The renal arteries are single bilaterally and they are widely patent. There appears to be nonobstructive stenosis of the right common iliac artery at the aortoiliac junction. The left common iliac artery appears to have significant ostial stenosis estimated at at least 75% angiographically. The visualized portions of the external iliac and common femoral arteries are patent  Final Conclusions:  1. Severe single-vessel coronary artery disease involving the distal right coronary artery 2. Moderate LV dysfunction with segmental severe hypokinesis of the inferior wall and normal contraction of the antero-apex with estimated left ventricular ejection fraction of 45%. 3. Severe left common iliac artery stenosis Recommendations: The patient has severe distal RCA disease. She has a known history of inferior wall infarction and remote PCA. This fits with that clinical history. She has nonobstructive disease of the left coronary artery. Her left ventricular ejection fraction by ventriculography is clearly greater than 35%. With her history of previous MI, I do not think PCI is indicated with an absence of angina. Will carefully discussed symptoms with the  patient, but I am inclined to treat her medically. Will also review indication for consideration of peripheral intervention for her iliac disease.   01/2013 Echo Study Conclusions  - Left ventricle: The cavity size was mildly to moderately dilated. Wall thickness was normal. Systolic function was severely reduced. The estimated ejection fraction was 25%. Severe diffuse hypokinesis. - Aortic valve: Mildly calcified annulus. Trileaflet. - Mitral valve: Calcified annulus. - Left atrium: The atrium was moderately dilated. - Right ventricle: The cavity size was normal. Wall thickness was mildly to moderately increased. - Right atrium: The atrium was mildly dilated. - Atrial septum: No defect or patent foramen ovale was identified. Impressions:  - Compared to the prior study of 03/13/11, there has been substantial deterioration in LV systolic function.  07/2014 Echo Study Conclusions  - Left ventricle: The cavity size was severely dilated. Wall thickness was normal. The estimated ejection fraction was 20%. Diffuse hypokinesis. There is akinesis of the  inferolateral and inferior myocardium. Features are consistent with a pseudonormal left ventricular filling pattern, with concomitant abnormal relaxation and increased filling pressure (grade 2 diastolic dysfunction). Doppler parameters are consistent with high ventricular filling pressure. - Mitral valve: There was mild to moderate regurgitation. - Left atrium: The atrium was moderately dilated.  Impressions:  - Global hypokinesis; inferior and inferolateral akinesis; overall severely reduced LV function; grade 2 diastolic dysfunction with elevated left ventricular filling pressures, moderate LAE; mild to moderate MR. 07/2014 Cath PROCEDURAL FINDINGS  Hemodynamics:  AO 122/48  LV 118/14  RA 9  RV 41/11  PA 37/5 with a mean of 21  PCWP 27  Oxygen saturations  AO 90  PA 56  Cardiac output 3.5  Cardiac  index 2.0  Coronary angiography:  Coronary dominance: right  Left mainstem: The left mainstem is patent with 30% distal left main stenosis.  Left anterior descending (LAD): The LAD is diffusely diseased in the midportion. There are sequential 50% stenoses, unchanged from previous study. The first diagonal Dana Dorner is large in distribution with diffuse nonobstructive disease noted. The mid and distal LAD are patent without significant disease.  Left circumflex (LCx): The left circumflex has 50% ostial stenosis, 50% mid vessel stenosis, and to OM branches with no significant disease. There is an intermediate Jasmond River is diffusely diseased with up to 70% stenosis. This vessel is small in caliber.  Right coronary artery (RCA): Dominant vessel. Tortuous with diffuse nonobstructive stenosis throughout the proximal and mid portions. There are diffuse 50% stenosis present. The distal vessel has an ulcerated-appearing irregular 90% stenosis involving the bifurcation of the PDA and posterolateral branches. The posterolateral branches are patent with mild diffuse disease. The PDA arises an acute angulation with 30-40% stenosis. The mid body of the PDA has an 80-90% stenosis in an area where the vessel is too small for PCI.  Left ventriculography: The ventricle appears the synchronous. The basal and midinferior wall are akinetic. The basal inferolateral wall is severely hypokinetic. The anterolateral wall and apex contract normally. The estimated LVEF is 30%.  PCI Procedure Note: Following the diagnostic procedure, the decision was made to proceed with PCI of the distal RCA. The lesion is complex, there is a large amount of myocardium involved. I felt there was a risk of potentially compromising the PDA Sirena Riddle, but overall in my opinion the risk/benefit was favorable for PCI considering the severe stenosis involving the entire RCA distribution. The sheath was upsized to a 6 [place]. Weight-based bivalirudin was given  for anticoagulation. The patient was loaded with Plavix 600 mg on the table. Once a therapeutic ACT was achieved, a 6 [place] JR 4 guide catheter was inserted. A cougar coronary guidewire was used to cross the lesion into the PLA Zayvien Canning. The lesion was predilated with a 2.5 x 15 mm balloon. The lesion was then stented with a 3.0 by 92m Promus DES stent. The stent was postdilated with a 3.25 mm noncompliant balloon to 18 atmospheres. The PDA was stented across as the stent extended from the distal RCA proximal PLA Victoriano Campion. The PDA was totally occluded. I made a long attempt with a whisper wire using multiple bands on the tip of the wire, but all of these attempts were unsuccessful at rewiring the PDA. The patient was completely asymptomatic and specifically denied any chest pain. Her hemodynamics remained stable and there were no changes in her rhythm or blood pressure. A final image of the left coronary artery was obtained and this demonstrated a collateral  from the LAD to the PDA Jacquetta Polhamus. Following PCI, there was 0% residual stenosis and TIMI-3 flow. Final angiography confirmed an excellent result. Femoral hemostasis was achieved will be achieved with manual hemostasis. The patient tolerated the PCI procedure well. There were no immediate procedural complications. The patient was transferred to the post catheterization recovery area for further monitoring.  PCI Data:  Vessel - RCA/Segment - distal  Percent Stenosis (pre) 90  TIMI-flow 3  Stent 3.0 by 3m Promus DES  Percent Stenosis (post) 0  TIMI-flow (post) 3  Contrast: 175 cc Omnipaque  Radiation dose/Fluoro time: 15.5 minutes  Estimated Blood Loss: Minimal  Final Conclusions:  1. Nonobstructive disease involving the LAD, left circumflex, and OM/diagonal side branches 2. Severe stenosis of the distal RCA bifurcation, treated successfully with a drug-eluting stent into the PLA Sophiah Rolin with residual occlusion of the PDA Kenly Henckel 3. Severe  segmental LV systolic dysfunction Recommendations: Dual antiplatelet therapy with aspirin and Plavix for at least 12 months. Will cycle cardiac enzymes since the PDA Dawnya Grams is occluded after stenting.  10/2014 Echo Study Conclusions  - Procedure narrative: Transthoracic echocardiography. Image quality was suboptimal. The study was technically difficult, as a result of poor sound wave transmission and body habitus. - Left ventricle: Systolic function is severely reduced, estimated EF 15-20%. The cavity size was severely dilated. Doppler parameters are consistent with abnormal left ventricular relaxation (grade 1 diastolic dysfunction). Doppler parameters are consistent with high ventricular filling pressure. Medial E/e&' 24. - Regional wall motion abnormality: Akinesis of the basal-mid inferior and basal inferolateral myocardium; severe hypokinesis of the apical inferior and mid inferolateral myocardium. Severe, diffuse hypokinesis. - Ventricular septum: Septal motion showed abnormal function and dyssynergy. These changes are consistent with intraventricular conduction delay. - Aortic valve: Mildly calcified annulus. Trileaflet; mildly thickened leaflets. - Mitral valve: Mildly dilated annulus. Mildly thickened leaflets . There was moderate eccentric regurgitation. 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 - Left atrium: The atrium was severely dilated. Volume/bsa, S: 47.1 ml/m^2. - Right ventricle: Systolic function was mildly reduced. - Right atrium: The atrium was mildly dilated. - Atrial septum: There was increased thickness of the septum, consistent with lipomatous hypertrophy. - Tricuspid valve: There was mild regurgitation. - Pulmonary arteries: PA peak pressure: 42 mm Hg (S). Mildly elevated pulmonary pressures. - Pericardium, extracardiac: There was a small pericardial effusion, with no evidence of tamponade  physiology.  04/2015 Carotid [place]: 1. Mild (1-49%) stenosis of the proximal right internal carotid artery secondary to smooth heterogeneous atherosclerotic plaque. No interval change compared to recent prior imaging from earlier this month. 2. Mild (1-49%) stenosis of the proximal left internal carotid artery secondary to heterogeneous and irregular atherosclerotic plaque. No interval change compared to recent prior imaging from earlier this month. 3. Vertebral arteries remain patent with normal antegrade flow. Signed,    10/2015 echo Study Conclusions  - Left ventricle: The cavity size was moderately to severely dilated. Wall thickness was normal. Systolic function was severely reduced. The estimated ejection fraction was in the range of 25% to 30%. Diffuse hypokinesis. There is akinesis of the basalanteroseptal and anterior myocardium. Doppler parameters are consistent with abnormal left ventricular relaxation (grade 1 diastolic dysfunction). - Mitral valve: Calcified annulus. Mildly calcified leaflets . There was mild regurgitation. - Right atrium: Central venous pressure (est): 3 mm Hg. - Atrial septum: No defect or patent foramen ovale was identified. - Tricuspid valve: There was trivial regurgitation. - Pulmonary arteries: PA peak pressure: 27 mm Hg (S). -  Pericardium, extracardiac: A prominent pericardial fat pad was present.  Impressions:  - Moderate to severe LV chamber dilatation with LVEF approximately 25-30%. There is diffuse hypokinesis with near akinesis of the basal anteroseptal and anterior myocardium. Compared to the previous study from June 2016 there has been some improvement in LVEF. Grade 1 diastolic dysfunction. MAC with mild mitral regurgitation. Trivial tricuspid regurgitation with PASP 27 mmHg.  Transthoracic echocardiography. M-mode, complete 2D, spectral Doppler, and color Doppler. Birthdate: Patient  birthdate: 28-Apr-1941. Age: Patient is 79 yr old. Sex: Gender: female. BMI: 27.5 kg/m^2. Blood pressure:   98/47 Patient status: Inpatient. Study date: Study date: 10/04/2015. Study time: 11:27 AM. Location: Echo laboratory   Assessment and Plan   1. CAD - no symptoms, continue current meds   2. Chronic systolic HF - Medical therpay limited by soft bp's and renal insufficiency -no symptoms, she is euvolemic - manual bp 106/58, continue current meds   3. Hyperlipidemia -statin changedpreviouslydue to cost, -request labs from pcp   F/u 6 months   Arnoldo Lenis, M.D.

## 2020-09-19 NOTE — Patient Instructions (Signed)
Your physician recommends that you schedule a follow-up appointment in: 6 MONTHS WITH DR BRANCH  Your physician recommends that you continue on your current medications as directed. Please refer to the Current Medication list given to you today.  Thank you for choosing [facility]!!    

## 2020-10-01 ENCOUNTER — Ambulatory Visit (INDEPENDENT_AMBULATORY_CARE_PROVIDER_SITE_OTHER): Payer: Medicare Other

## 2020-10-01 DIAGNOSIS — I255 Ischemic cardiomyopathy: Secondary | ICD-10-CM

## 2020-10-01 LAB — CUP PACEART REMOTE DEVICE CHECK
Battery Remaining Longevity: 21 mo
Battery Voltage: 2.92 V
Brady Statistic AP VP Percent: 97.59 %
Brady Statistic AP VS Percent: 1.35 %
Brady Statistic AS VP Percent: 1.04 %
Brady Statistic AS VS Percent: 0.02 %
Brady Statistic RA Percent Paced: 96.83 %
Brady Statistic RV Percent Paced: 81.13 %
Date Time Interrogation Session: 20211129033327
HighPow Impedance: 71 Ohm
Implantable Lead Implant Date: 20160615
Implantable Lead Implant Date: 20160615
Implantable Lead Implant Date: 20160615
Implantable Lead Location: 753858
Implantable Lead Location: 753859
Implantable Lead Location: 753860
Implantable Lead Model: 4398
Implantable Lead Model: 5076
Implantable Pulse Generator Implant Date: 20160615
Lead Channel Impedance Value: 1178 Ohm
Lead Channel Impedance Value: 1178 Ohm
Lead Channel Impedance Value: 1197 Ohm
Lead Channel Impedance Value: 475 Ohm
Lead Channel Impedance Value: 475 Ohm
Lead Channel Impedance Value: 475 Ohm
Lead Channel Impedance Value: 532 Ohm
Lead Channel Impedance Value: 551 Ohm
Lead Channel Impedance Value: 589 Ohm
Lead Channel Impedance Value: 646 Ohm
Lead Channel Impedance Value: 817 Ohm
Lead Channel Impedance Value: 817 Ohm
Lead Channel Impedance Value: 817 Ohm
Lead Channel Pacing Threshold Amplitude: 0.5 V
Lead Channel Pacing Threshold Amplitude: 0.625 V
Lead Channel Pacing Threshold Amplitude: 1.25 V
Lead Channel Pacing Threshold Pulse Width: 0.4 ms
Lead Channel Pacing Threshold Pulse Width: 0.4 ms
Lead Channel Pacing Threshold Pulse Width: 0.4 ms
Lead Channel Sensing Intrinsic Amplitude: 1.875 mV
Lead Channel Sensing Intrinsic Amplitude: 1.875 mV
Lead Channel Sensing Intrinsic Amplitude: 15.875 mV
Lead Channel Sensing Intrinsic Amplitude: 15.875 mV
Lead Channel Setting Pacing Amplitude: 1.5 V
Lead Channel Setting Pacing Amplitude: 2 V
Lead Channel Setting Pacing Amplitude: 2.25 V
Lead Channel Setting Pacing Pulse Width: 0.4 ms
Lead Channel Setting Pacing Pulse Width: 0.4 ms
Lead Channel Setting Sensing Sensitivity: 0.45 mV

## 2020-10-02 ENCOUNTER — Ambulatory Visit (INDEPENDENT_AMBULATORY_CARE_PROVIDER_SITE_OTHER): Payer: Medicare Other

## 2020-10-02 DIAGNOSIS — Z95810 Presence of automatic (implantable) cardiac defibrillator: Secondary | ICD-10-CM

## 2020-10-02 DIAGNOSIS — I5022 Chronic systolic (congestive) heart failure: Secondary | ICD-10-CM | POA: Diagnosis not present

## 2020-10-05 ENCOUNTER — Other Ambulatory Visit (HOSPITAL_COMMUNITY): Payer: Self-pay | Admitting: Family Medicine

## 2020-10-05 DIAGNOSIS — R319 Hematuria, unspecified: Secondary | ICD-10-CM | POA: Diagnosis not present

## 2020-10-05 DIAGNOSIS — I340 Nonrheumatic mitral (valve) insufficiency: Secondary | ICD-10-CM | POA: Diagnosis not present

## 2020-10-05 DIAGNOSIS — I5022 Chronic systolic (congestive) heart failure: Secondary | ICD-10-CM | POA: Diagnosis not present

## 2020-10-05 DIAGNOSIS — Z720 Tobacco use: Secondary | ICD-10-CM | POA: Diagnosis not present

## 2020-10-05 DIAGNOSIS — I25119 Atherosclerotic heart disease of native coronary artery with unspecified angina pectoris: Secondary | ICD-10-CM | POA: Diagnosis not present

## 2020-10-05 DIAGNOSIS — E063 Autoimmune thyroiditis: Secondary | ICD-10-CM | POA: Diagnosis not present

## 2020-10-05 DIAGNOSIS — N183 Chronic kidney disease, stage 3 unspecified: Secondary | ICD-10-CM | POA: Diagnosis not present

## 2020-10-05 DIAGNOSIS — Z0000 Encounter for general adult medical examination without abnormal findings: Secondary | ICD-10-CM | POA: Diagnosis not present

## 2020-10-05 DIAGNOSIS — E782 Mixed hyperlipidemia: Secondary | ICD-10-CM | POA: Diagnosis not present

## 2020-10-05 DIAGNOSIS — E2839 Other primary ovarian failure: Secondary | ICD-10-CM

## 2020-10-05 DIAGNOSIS — Z950 Presence of cardiac pacemaker: Secondary | ICD-10-CM | POA: Diagnosis not present

## 2020-10-05 DIAGNOSIS — I129 Hypertensive chronic kidney disease with stage 1 through stage 4 chronic kidney disease, or unspecified chronic kidney disease: Secondary | ICD-10-CM | POA: Diagnosis not present

## 2020-10-05 DIAGNOSIS — Z23 Encounter for immunization: Secondary | ICD-10-CM | POA: Diagnosis not present

## 2020-10-05 NOTE — Progress Notes (Signed)
EPIC Encounter for ICM Monitoring  Patient Name: Nicole Clements is a 79 y.o. female Date: 10/05/2020 Primary Care Physican: Lupita Raider, MD Primary Cardiologist:Branch Electrophysiologist: Graciela Husbands Nephrologist: Celene Skeen Pacing:96% 11/17/2021Office Weight: 146lbs  Transmission reviewed.   Optivol thoracic impedancesuggestingfluid level is normal.  Prescribed:  Furosemide 20 mgTake 20 mg daily. You may take an extra 20 mg daily as needed for swelling.  Potassium 20 mEq take 1 tabletdaily  Lab 10/22/2020Creatinine 1.57, BUN 23, Potassium 5.6, Sodium 127, [id number]  Recommendations:No changes and encouraged to call if experiencing any fluid symptoms.  Follow-up plan: ICM clinic phone appointment on1/02/2021.91 day device clinic remote scheduled2/28/2022.  EP/Cardiology Office Visits:  Copy of ICM check sent to Dr.Klein    3 month ICM trend: 10/01/2020    1 Year ICM trend:       Karie Soda, RN 10/05/2020 4:35 PM

## 2020-10-08 NOTE — Progress Notes (Signed)
Remote ICD transmission.   

## 2020-10-16 ENCOUNTER — Encounter: Payer: Self-pay | Admitting: *Deleted

## 2020-11-01 DIAGNOSIS — N390 Urinary tract infection, site not specified: Secondary | ICD-10-CM | POA: Diagnosis not present

## 2020-11-06 ENCOUNTER — Ambulatory Visit (INDEPENDENT_AMBULATORY_CARE_PROVIDER_SITE_OTHER): Payer: Medicare Other

## 2020-11-06 DIAGNOSIS — I5022 Chronic systolic (congestive) heart failure: Secondary | ICD-10-CM

## 2020-11-06 DIAGNOSIS — Z95810 Presence of automatic (implantable) cardiac defibrillator: Secondary | ICD-10-CM | POA: Diagnosis not present

## 2020-11-09 NOTE — Progress Notes (Signed)
EPIC Encounter for ICM Monitoring  Patient Name: Nicole Clements is a 80 y.o. female Date: 11/09/2020 Primary Care Physican: Lupita Raider, MD Primary Cardiologist:Branch Electrophysiologist: Graciela Husbands Nephrologist: Celene Skeen Pacing:96.8% 11/09/2020 Weight: 143lbs  Spoke with patient and reports feeling well at this time.  Denies fluid symptoms.    Optivol thoracic impedancesuggestingfluid levelis normal.  Prescribed:  Furosemide 20 mgTake 20 mg daily. You may take an extra 20 mg daily as needed for swelling.  Potassium 20 mEq take 1 tabletdaily  Lab 10/22/2020Creatinine 1.57, BUN 23, Potassium 5.6, Sodium 127, [id number]  Recommendations:No changes and encouraged to call if experiencing any fluid symptoms.  Follow-up plan: ICM clinic phone appointment on2/14/2022.91 day device clinic remote scheduled2/28/2022.  EP/Cardiology Office Visits:  Copy of ICM check sent to Dr.Klein  3 month ICM trend: 11/06/2020.    1 Year ICM trend:       Karie Soda, RN 11/09/2020 4:44 PM

## 2020-11-15 DIAGNOSIS — N390 Urinary tract infection, site not specified: Secondary | ICD-10-CM | POA: Diagnosis not present

## 2020-12-17 ENCOUNTER — Ambulatory Visit (INDEPENDENT_AMBULATORY_CARE_PROVIDER_SITE_OTHER): Payer: Medicare Other

## 2020-12-17 DIAGNOSIS — I5022 Chronic systolic (congestive) heart failure: Secondary | ICD-10-CM | POA: Diagnosis not present

## 2020-12-17 DIAGNOSIS — Z95810 Presence of automatic (implantable) cardiac defibrillator: Secondary | ICD-10-CM | POA: Diagnosis not present

## 2020-12-25 NOTE — Progress Notes (Signed)
EPIC Encounter for ICM Monitoring  Patient Name: Nicole Clements is a 80 y.o. female Date: 12/25/2020 Primary Care Physican: Lupita Raider, MD Primary Cardiologist:Branch Electrophysiologist: Graciela Husbands Nephrologist: Celene Skeen Pacing:97.3% 1/7/2022Weight: 143lbs  Transmission reviewed.  Optivol thoracic impedancesuggestingfluid levelsare normal.  Prescribed:  Furosemide 20 mgTake 20 mg daily. You may take an extra 20 mg daily as needed for swelling.  Potassium 20 mEq take 1 tabletdaily  Lab 10/22/2020Creatinine 1.57, BUN 23, Potassium 5.6, Sodium 127, [id number]  Recommendations:No changes.  Follow-up plan: ICM clinic phone appointment on3/21/2022.91 day device clinic remote scheduled2/28/2022.  Cardiology Office Visit: 03/06/2021 with Dr Wyline Mood.  EP Office Visit: 01/29/2021 with Dr Ladona Ridgel  Copy of ICM check sent to Dr.Klein  3 month ICM trend: 12/17/2020.    1 Year ICM trend:       Karie Soda, RN 12/25/2020 11:29 AM

## 2020-12-31 ENCOUNTER — Ambulatory Visit (INDEPENDENT_AMBULATORY_CARE_PROVIDER_SITE_OTHER): Payer: Medicare Other

## 2020-12-31 DIAGNOSIS — I5022 Chronic systolic (congestive) heart failure: Secondary | ICD-10-CM

## 2020-12-31 DIAGNOSIS — I255 Ischemic cardiomyopathy: Secondary | ICD-10-CM

## 2021-01-02 LAB — CUP PACEART REMOTE DEVICE CHECK
Battery Remaining Longevity: 19 mo
Battery Voltage: 2.91 V
Brady Statistic AP VP Percent: 98.37 %
Brady Statistic AP VS Percent: 1.37 %
Brady Statistic AS VP Percent: 0.25 %
Brady Statistic AS VS Percent: 0.01 %
Brady Statistic RA Percent Paced: 99.12 %
Brady Statistic RV Percent Paced: 86.59 %
Date Time Interrogation Session: 20220228031604
HighPow Impedance: 70 Ohm
Implantable Lead Implant Date: 20160615
Implantable Lead Implant Date: 20160615
Implantable Lead Implant Date: 20160615
Implantable Lead Location: 753858
Implantable Lead Location: 753859
Implantable Lead Location: 753860
Implantable Lead Model: 4398
Implantable Lead Model: 5076
Implantable Pulse Generator Implant Date: 20160615
Lead Channel Impedance Value: 1140 Ohm
Lead Channel Impedance Value: 1140 Ohm
Lead Channel Impedance Value: 1178 Ohm
Lead Channel Impedance Value: 399 Ohm
Lead Channel Impedance Value: 418 Ohm
Lead Channel Impedance Value: 418 Ohm
Lead Channel Impedance Value: 456 Ohm
Lead Channel Impedance Value: 475 Ohm
Lead Channel Impedance Value: 551 Ohm
Lead Channel Impedance Value: 608 Ohm
Lead Channel Impedance Value: 703 Ohm
Lead Channel Impedance Value: 722 Ohm
Lead Channel Impedance Value: 836 Ohm
Lead Channel Pacing Threshold Amplitude: 0.5 V
Lead Channel Pacing Threshold Amplitude: 0.5 V
Lead Channel Pacing Threshold Amplitude: 1.25 V
Lead Channel Pacing Threshold Pulse Width: 0.4 ms
Lead Channel Pacing Threshold Pulse Width: 0.4 ms
Lead Channel Pacing Threshold Pulse Width: 0.4 ms
Lead Channel Sensing Intrinsic Amplitude: 1.375 mV
Lead Channel Sensing Intrinsic Amplitude: 1.375 mV
Lead Channel Sensing Intrinsic Amplitude: 12 mV
Lead Channel Sensing Intrinsic Amplitude: 12 mV
Lead Channel Setting Pacing Amplitude: 1.5 V
Lead Channel Setting Pacing Amplitude: 2 V
Lead Channel Setting Pacing Amplitude: 2.25 V
Lead Channel Setting Pacing Pulse Width: 0.4 ms
Lead Channel Setting Pacing Pulse Width: 0.4 ms
Lead Channel Setting Sensing Sensitivity: 0.45 mV

## 2021-01-07 ENCOUNTER — Other Ambulatory Visit: Payer: Self-pay | Admitting: Cardiology

## 2021-01-08 NOTE — Progress Notes (Signed)
Remote ICD transmission.   

## 2021-01-21 ENCOUNTER — Ambulatory Visit (HOSPITAL_COMMUNITY)
Admission: RE | Admit: 2021-01-21 | Discharge: 2021-01-21 | Disposition: A | Discharge status: Home / Self Care | Payer: Medicare Other | Source: Ambulatory Visit | Attending: Family Medicine | Admitting: Family Medicine

## 2021-01-21 ENCOUNTER — Ambulatory Visit (INDEPENDENT_AMBULATORY_CARE_PROVIDER_SITE_OTHER): Payer: Medicare Other

## 2021-01-21 ENCOUNTER — Other Ambulatory Visit: Payer: Self-pay

## 2021-01-21 DIAGNOSIS — M85852 Other specified disorders of bone density and structure, left thigh: Secondary | ICD-10-CM | POA: Diagnosis not present

## 2021-01-21 DIAGNOSIS — I5022 Chronic systolic (congestive) heart failure: Secondary | ICD-10-CM | POA: Diagnosis not present

## 2021-01-21 DIAGNOSIS — Z95810 Presence of automatic (implantable) cardiac defibrillator: Secondary | ICD-10-CM

## 2021-01-21 DIAGNOSIS — E2839 Other primary ovarian failure: Secondary | ICD-10-CM | POA: Diagnosis not present

## 2021-01-21 DIAGNOSIS — Z780 Asymptomatic menopausal state: Secondary | ICD-10-CM | POA: Diagnosis not present

## 2021-01-22 ENCOUNTER — Other Ambulatory Visit: Payer: Self-pay | Admitting: Cardiology

## 2021-01-23 NOTE — Progress Notes (Signed)
EPIC Encounter for ICM Monitoring  Patient Name: Nicole Clements is a 80 y.o. female Date: 01/23/2021 Primary Care Physican: Lupita Raider, MD Primary Cardiologist:Branch Electrophysiologist: Graciela Husbands Nephrologist: Celene Skeen Pacing:96.5% 3/21/2022Weight: 143lbs  Spoke with patient and reports feeling well at this time.  Denies fluid symptoms.    Optivol thoracic impedancesuggestingfluid levelsare normal.  Prescribed:  Furosemide 20 mgTake 20 mg daily. You may take an extra 20 mg daily as needed for swelling.  Potassium 20 mEq take 1 tabletdaily  Lab 10/05/2020 Creatinine 1.56, BUN 24, Potassium 5.4, Sodium 124 GFR 32-39 10/22/2020Creatinine 1.57, BUN 23, Potassium 5.6, Sodium 127, [id number]  Recommendations:No changes and encouraged to call if experiencing any fluid symptoms.  Follow-up plan: ICM clinic phone appointment on4/25/2022.91 day device clinic remote scheduled5/31/2022.  Cardiology Office Visit: 03/06/2021 with Dr Wyline Mood.  EP Office Visit: 01/29/2021 with Dr Ladona Ridgel  Copy of ICM check sent to Dr.Klein.   3 month ICM trend: 01/21/2021.    1 Year ICM trend:       Karie Soda, RN 01/23/2021 3:39 PM

## 2021-01-29 ENCOUNTER — Encounter: Payer: Self-pay | Admitting: Internal Medicine

## 2021-01-29 ENCOUNTER — Ambulatory Visit (INDEPENDENT_AMBULATORY_CARE_PROVIDER_SITE_OTHER): Payer: Medicare Other | Admitting: Internal Medicine

## 2021-01-29 ENCOUNTER — Other Ambulatory Visit: Payer: Self-pay

## 2021-01-29 VITALS — BP 110/56 | HR 86 | Ht 62.0 in | Wt 143.8 lb

## 2021-01-29 DIAGNOSIS — I5022 Chronic systolic (congestive) heart failure: Secondary | ICD-10-CM

## 2021-01-29 DIAGNOSIS — I255 Ischemic cardiomyopathy: Secondary | ICD-10-CM | POA: Diagnosis not present

## 2021-01-29 LAB — CUP PACEART INCLINIC DEVICE CHECK
Battery Remaining Longevity: 18 mo
Battery Voltage: 2.9 V
Brady Statistic AP VP Percent: 98.18 %
Brady Statistic AP VS Percent: 1.51 %
Brady Statistic AS VP Percent: 0.29 %
Brady Statistic AS VS Percent: 0.02 %
Brady Statistic RA Percent Paced: 94.71 %
Brady Statistic RV Percent Paced: 55.7 %
Date Time Interrogation Session: 20220329133551
HighPow Impedance: 63 Ohm
Implantable Lead Implant Date: 20160615
Implantable Lead Implant Date: 20160615
Implantable Lead Implant Date: 20160615
Implantable Lead Location: 753858
Implantable Lead Location: 753859
Implantable Lead Location: 753860
Implantable Lead Model: 4398
Implantable Lead Model: 5076
Implantable Pulse Generator Implant Date: 20160615
Lead Channel Impedance Value: 1121 Ohm
Lead Channel Impedance Value: 1178 Ohm
Lead Channel Impedance Value: 1235 Ohm
Lead Channel Impedance Value: 399 Ohm
Lead Channel Impedance Value: 418 Ohm
Lead Channel Impedance Value: 475 Ohm
Lead Channel Impedance Value: 513 Ohm
Lead Channel Impedance Value: 532 Ohm
Lead Channel Impedance Value: 551 Ohm
Lead Channel Impedance Value: 646 Ohm
Lead Channel Impedance Value: 703 Ohm
Lead Channel Impedance Value: 703 Ohm
Lead Channel Impedance Value: 836 Ohm
Lead Channel Pacing Threshold Amplitude: 0.5 V
Lead Channel Pacing Threshold Amplitude: 0.5 V
Lead Channel Pacing Threshold Amplitude: 1.5 V
Lead Channel Pacing Threshold Pulse Width: 0.4 ms
Lead Channel Pacing Threshold Pulse Width: 0.4 ms
Lead Channel Pacing Threshold Pulse Width: 0.4 ms
Lead Channel Sensing Intrinsic Amplitude: 1.5 mV
Lead Channel Sensing Intrinsic Amplitude: 13.25 mV
Lead Channel Setting Pacing Amplitude: 1.5 V
Lead Channel Setting Pacing Amplitude: 2 V
Lead Channel Setting Pacing Amplitude: 2.25 V
Lead Channel Setting Pacing Pulse Width: 0.4 ms
Lead Channel Setting Pacing Pulse Width: 0.4 ms
Lead Channel Setting Sensing Sensitivity: 0.45 mV

## 2021-01-29 NOTE — Patient Instructions (Signed)
Medication Instructions:  Your physician recommends that you continue on your current medications as directed. Please refer to the Current Medication list given to you today.  *If you need a refill on your cardiac medications before your next appointment, please call your pharmacy*   Lab Work: NONE   If you have labs (blood work) drawn today and your tests are completely normal, you will receive your results only by: . MyChart Message (if you have MyChart) OR . A paper copy in the mail If you have any lab test that is abnormal or we need to change your treatment, we will call you to review the results.   Testing/Procedures: NONE    Follow-Up: At [facility], you and your health needs are our priority.  As part of our continuing mission to provide you with exceptional heart care, we have created designated Provider Care Teams.  These Care Teams include your primary Cardiologist (physician) and Advanced Practice Providers (APPs -  Physician Assistants and Nurse Practitioners) who all work together to provide you with the care you need, when you need it.  We recommend signing up for the patient portal called "MyChart".  Sign up information is provided on this After Visit Summary.  MyChart is used to connect with patients for Virtual Visits (Telemedicine).  Patients are able to view lab/test results, encounter notes, upcoming appointments, etc.  Non-urgent messages can be sent to your provider as well.   To learn more about what you can do with MyChart, go to [email or web address].    Your next appointment:   1 year(s)  The format for your next appointment:   In Person  Provider:   Gregg Taylor, MD   Other Instructions Thank you for choosing [facility]!    

## 2021-01-29 NOTE — Progress Notes (Signed)
HPI Ms. Whittier returns today for ongoing followup. She is a pleasant 80 yo woman with an ICM, chronic systolic CHF and LBBB, s/p biv ICD insertion. She was followed by Dr. Crissie Sickles but prefers to be seen in our [place] office as she lives here. She has not had any ICD shocks. No chest pain or sob. She is still smoking.  Allergies  Allergen Reactions  . Lactose Intolerance (Gi) Other (See Comments)    GI Upset   . Sulfa Antibiotics Rash     Current Outpatient Medications  Medication Sig Dispense Refill  . acetaminophen (TYLENOL) 500 MG tablet Take 1,000 mg by mouth every 8 (eight) hours as needed for mild pain or headache.    [name] aspirin EC 81 MG tablet Take 1 tablet (81 mg total) by mouth daily. 90 tablet 3  . carvedilol (COREG) 25 MG tablet TAKE 1 TABLET BY MOUTH TWICE DAILY 60 tablet 6  . Cholecalciferol (VITAMIN D3) 2000 UNITS capsule Take 2,000 Units by mouth daily.    . furosemide (LASIX) 20 MG tablet Take 20 mg daily. You may take an extra 20 mg daily as needed for swelling. 180 tablet 3  . losartan (COZAAR) 25 MG tablet TAKE 1 TABLET(25 MG) BY MOUTH DAILY 90 tablet 1  . nitroGLYCERIN (NITROSTAT) 0.4 MG SL tablet Place 1 tablet (0.4 mg total) under the tongue every 5 (five) minutes x 3 doses as needed for chest pain. 25 tablet 2  . Omega-3 Fatty Acids (FISH OIL) 1200 MG CAPS Take 1,200 mg by mouth daily.    . potassium chloride SA (KLOR-CON) 20 MEQ tablet TAKE 1 TABLET(20 MEQ) BY MOUTH DAILY 90 tablet 2  . pravastatin (PRAVACHOL) 80 MG tablet Take 80 mg by mouth daily.    [name] spironolactone (ALDACTONE) 25 MG tablet TAKE 1/2 TABLET(12.5 MG) BY MOUTH TWICE DAILY 90 tablet 3   No current facility-administered medications for this visit.     Past Medical History:  Diagnosis Date  . AICD (automatic cardioverter/defibrillator) present   . CAD (coronary artery disease) April 2014   DES to PLA, occluded PDA 07/2014  . CHF (congestive heart failure) (HCC)   . Hypercholesterolemia    . Hypertension   . Ischemic cardiomyopathy    LVEF 25%-45%  . IVCD (intraventricular conduction defect)   . PVC's (premature ventricular contractions)   . S/P colonoscopy August 2007   Hyperplastic polyps, rare sigmoid and descending colon diverticulosis, small internal hemorrhoids  . STEMI (ST elevation myocardial infarction) (HCC) 07/22/1996    ROS:   All systems reviewed and negative except as noted in the HPI.   Past Surgical History:  Procedure Laterality Date  . ABDOMINAL HYSTERECTOMY    . CARDIAC CATHETERIZATION  April 2014   Med Rx  . CATARACT EXTRACTION W/PHACO Right 09/15/2017   Procedure: CATARACT EXTRACTION PHACO AND INTRAOCULAR LENS PLACEMENT (IOC);  Surgeon: Jethro Bolus, MD;  Location: [facility];  Service: Ophthalmology;  Laterality: Right;  CDE: 12.22  . CATARACT EXTRACTION W/PHACO Left 09/29/2017   Procedure: CATARACT EXTRACTION PHACO AND INTRAOCULAR LENS PLACEMENT (IOC);  Surgeon: Jethro Bolus, MD;  Location: [facility];  Service: Ophthalmology;  Laterality: Left;  CDE: 10.02  . COLONOSCOPY N/A 12/24/2015   Dr. Fields:moderate sized internal hemorrhoids/moderate diverticulosis, negative microscopic colitis   . EP IMPLANTABLE DEVICE  04/18/2015   BV ICD  . EP IMPLANTABLE DEVICE N/A 04/18/2015   Procedure: BiV ICD Insertion CRT-D;  Surgeon: Duke Salvia, MD;  Location: [facility]  INVASIVE CV LAB;  Service: Cardiovascular;  Laterality: N/A;  . LEFT AND RIGHT HEART CATHETERIZATION WITH CORONARY ANGIOGRAM N/A 07/17/2014   Procedure: LEFT AND RIGHT HEART CATHETERIZATION WITH CORONARY ANGIOGRAM;  Surgeon: Micheline Chapman, MD;  Location: [facility] CATH LAB;  Service: Cardiovascular;  Laterality: N/A;  . PERCUTANEOUS CORONARY STENT INTERVENTION (PCI-S)  07/17/2014   Procedure: PERCUTANEOUS CORONARY STENT INTERVENTION (PCI-S);  Surgeon: Micheline Chapman, MD;  Location: [facility] CATH LAB;  Service: Cardiovascular;;  Distal RCA  . S/P Hysterectomy       Family History  Problem Relation Age of  Onset  . Hypertension Sister   . Diabetes Mellitus II Sister   . Hypertension Brother   . Diabetes Mellitus II Brother   . Hypertension Brother   . Diabetes Mellitus II Brother   . Hypertension Brother   . Diabetes Mellitus II Brother   . Colon cancer Neg Hx      Social History   Socioeconomic History  . Marital status: Single    Spouse name: Not on file  . Number of children: 1  . Years of education: Not on file  . Highest education level: Not on file  Occupational History  . Occupation: Retired    Comment: Clinical biochemist rep  Tobacco Use  . Smoking status: Current Every Day Smoker    Packs/day: 0.50    Years: 50.00    Pack years: 25.00    Types: Cigarettes    Start date: 02/26/1962  . Smokeless tobacco: Never Used  . Tobacco comment: down to less than 1/2 ppd  Vaping Use  . Vaping Use: Never used  Substance and Sexual Activity  . Alcohol use: No    Alcohol/week: 0.0 standard drinks  . Drug use: No  . Sexual activity: Never    Birth control/protection: None  Other Topics Concern  . Not on file  Social History Narrative  . Not on file   Social Determinants of Health   Financial Resource Strain: Not on file  Food Insecurity: Not on file  Transportation Needs: Not on file  Physical Activity: Not on file  Stress: Not on file  Social Connections: Not on file  Intimate Partner Violence: Not on file     BP (!) 110/56   Pulse 86   Ht 5\' 2"  (1.575 m)   Wt 143 lb 12.8 oz (65.2 kg)   SpO2 99%   BMI 26.30 kg/m   Physical Exam:  Well appearing NAD HEENT: Unremarkable Neck:  No JVD, no thyromegally Lymphatics:  No adenopathy Back:  No CVA tenderness Lungs:  Clear with no wheezes HEART:  Regular rate rhythm, no murmurs, no rubs, no clicks Abd:  soft, positive bowel sounds, no organomegally, no rebound, no guarding Ext:  2 plus pulses, no edema, no cyanosis, no clubbing Skin:  No rashes no nodules Neuro:  CN II through XII intact, motor grossly  intact  EKG - NSR with biv pacing  DEVICE  Normal device function.  See PaceArt for details.   Assess/Plan: 1. Chronic systolic heart failure - her symptoms are class 2. No change in her meds today. She will continue a low sodium diet. 2. ICD - her Medtronic BiV ICD is working normally. We will check her in the office in 12 months. 3. CAD - she denies anginal symptoms.  4. Tobacco abuse - she is encouraged to stop smoking.  Magenta Schmiesing,MD

## 2021-02-13 ENCOUNTER — Other Ambulatory Visit: Payer: Self-pay | Admitting: Cardiology

## 2021-02-25 ENCOUNTER — Ambulatory Visit (INDEPENDENT_AMBULATORY_CARE_PROVIDER_SITE_OTHER): Payer: Medicare Other

## 2021-02-25 ENCOUNTER — Other Ambulatory Visit: Payer: Self-pay | Admitting: Cardiology

## 2021-02-25 DIAGNOSIS — I5022 Chronic systolic (congestive) heart failure: Secondary | ICD-10-CM | POA: Diagnosis not present

## 2021-02-25 DIAGNOSIS — Z95810 Presence of automatic (implantable) cardiac defibrillator: Secondary | ICD-10-CM

## 2021-02-27 NOTE — Progress Notes (Signed)
EPIC Encounter for ICM Monitoring  Patient Name: Nicole Clements is a 80 y.o. female Date: 02/27/2021 Primary Care Physican: Lupita Raider, MD Primary Cardiologist:Branch Electrophysiologist: Graciela Husbands Nephrologist: Celene Skeen Pacing:97.5% 3/21/2022Weight: 143lbs  Spoke with patient and reports feeling well at this time.  Denies fluid symptoms.    Optivol thoracic impedancesuggestingfluid levelsarenormal.  Prescribed:  Furosemide 20 mgTake 20 mg daily. You may take an extra 20 mg daily as needed for swelling.  Potassium 20 mEq take 1 tabletdaily  Lab 10/05/2020 Creatinine 1.56, BUN 24, Potassium 5.4, Sodium 124 GFR 32-39 10/22/2020Creatinine 1.57, BUN 23, Potassium 5.6, Sodium 127, [id number]  Recommendations:No changes and encouraged to call if experiencing any fluid symptoms.  Follow-up plan: ICM clinic phone appointment on6/11/2020.91 day device clinic remote scheduled5/31/2022.  EP/Cardiology Office Visits: 03/26/2021 with Dr Wyline Mood.   Copy of ICM check sent to Dr.Klein.   3 month ICM trend: 02/25/2021.    1 Year ICM trend:       Karie Soda, RN 02/27/2021 10:53 AM

## 2021-03-26 ENCOUNTER — Encounter: Payer: Self-pay | Admitting: Cardiology

## 2021-03-26 ENCOUNTER — Ambulatory Visit (INDEPENDENT_AMBULATORY_CARE_PROVIDER_SITE_OTHER): Payer: Medicare Other | Admitting: Cardiology

## 2021-03-26 ENCOUNTER — Encounter: Payer: Self-pay | Admitting: *Deleted

## 2021-03-26 VITALS — BP 115/60 | HR 88 | Ht 62.0 in | Wt 145.0 lb

## 2021-03-26 DIAGNOSIS — I2510 Atherosclerotic heart disease of native coronary artery without angina pectoris: Secondary | ICD-10-CM

## 2021-03-26 DIAGNOSIS — I255 Ischemic cardiomyopathy: Secondary | ICD-10-CM | POA: Diagnosis not present

## 2021-03-26 DIAGNOSIS — E782 Mixed hyperlipidemia: Secondary | ICD-10-CM

## 2021-03-26 DIAGNOSIS — I5022 Chronic systolic (congestive) heart failure: Secondary | ICD-10-CM | POA: Diagnosis not present

## 2021-03-26 NOTE — Patient Instructions (Signed)
Medication Instructions:  Continue all current medications.  Labwork: none  Testing/Procedures: Your physician has requested that you have an echocardiogram. Echocardiography is a painless test that uses sound waves to create images of your heart. It provides your doctor with information about the size and shape of your heart and how well your heart's chambers and valves are working. This procedure takes approximately one hour. There are no restrictions for this procedure. Office will contact with results via phone or letter.     Follow-Up: 4 months   Any Other Special Instructions Will Be Listed Below (If Applicable).   If you need a refill on your cardiac medications before your next appointment, please call your pharmacy.  

## 2021-03-26 NOTE — Progress Notes (Signed)
Clinical Summary Nicole Clements is a 80 y.o.female seen today for follow up of the following medical problems.   1. 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 Admit 07/2014 with CHF and troponin elevation - cath 07/2014, LM 30% distal, LAD mid 50%, LCX ostial 50% and mid 50%, small intermediate Nicole Clements 70%. RCA diffuse 50%, distal RCA with ulcerated 90% lesion, PDA 80-90% too small for PCI. LVEF 30%. Received DES to distal RCA, post procedure the PDA became occluded.  - echo 07/2014 LVEF 20%, inferior akinesis, grade II diastolic dysfunction. Repeat echo 10/2014 LVEF remains 15-20%.  - 04/2015 CRT-D device placed by Dr Caryl Comes. Normal function Jan 2018.  10/2015 echo LVEF 25-30% -could not afford entresto, back on losartan.   - no SOB/DOE. No chest pains. No recent LE edema - compliant with meds - 01/2021 normal device check  2. Hyperlipidemia - atorva was too expensive, changed to pravastatinby pcp   10/2020 TC 104 TG 73 HDL 40 LDL 64  3. CKDIII -followed by pcp, last Cr stable around 1.5   Past Medical History:  Diagnosis Date  . AICD (automatic cardioverter/defibrillator) present   . CAD (coronary artery disease) April 2014   DES to PLA, occluded PDA 07/2014  . CHF (congestive heart failure) ([place])   . Hypercholesterolemia   . Hypertension   . Ischemic cardiomyopathy    LVEF 25%-45%  . IVCD (intraventricular conduction defect)   . PVC's (premature ventricular contractions)   . S/P colonoscopy August 2007   Hyperplastic polyps, rare sigmoid and descending colon diverticulosis, small internal hemorrhoids  . STEMI (ST elevation myocardial infarction) ([place]) 07/22/1996     Allergies  Allergen Reactions  . Lactose Intolerance (Gi) Other (See Comments)    GI Upset   . Sulfa Antibiotics Rash     Current Outpatient Medications  Medication Sig Dispense Refill  . acetaminophen (TYLENOL) 500 MG tablet Take 1,000 mg by mouth every 8 (eight) hours as  needed for mild pain or headache.    [name] aspirin EC 81 MG tablet Take 1 tablet (81 mg total) by mouth daily. 90 tablet 3  . carvedilol (COREG) 25 MG tablet TAKE 1 TABLET BY MOUTH TWICE DAILY 60 tablet 6  . Cholecalciferol (VITAMIN D3) 2000 UNITS capsule Take 2,000 Units by mouth daily.    . furosemide (LASIX) 20 MG tablet TAKE 1 TABLET EVERY DAY YOU MAY TAKE AN EXTRA 20 MG EVERY DAY AS NEEDED FOR SWELLING 180 tablet 3  . losartan (COZAAR) 25 MG tablet TAKE 1 TABLET(25 MG) BY MOUTH DAILY 90 tablet 3  . nitroGLYCERIN (NITROSTAT) 0.4 MG SL tablet Place 1 tablet (0.4 mg total) under the tongue every 5 (five) minutes x 3 doses as needed for chest pain. 25 tablet 2  . Omega-3 Fatty Acids (FISH OIL) 1200 MG CAPS Take 1,200 mg by mouth daily.    . potassium chloride SA (KLOR-CON) 20 MEQ tablet TAKE 1 TABLET(20 MEQ) BY MOUTH DAILY 90 tablet 2  . pravastatin (PRAVACHOL) 80 MG tablet Take 80 mg by mouth daily.    [name] spironolactone (ALDACTONE) 25 MG tablet TAKE 1/2 TABLET(12.5 MG) BY MOUTH TWICE DAILY 90 tablet 3   No current facility-administered medications for this visit.     Past Surgical History:  Procedure Laterality Date  . ABDOMINAL HYSTERECTOMY    . CARDIAC CATHETERIZATION  April 2014   Med Rx  . CATARACT EXTRACTION W/PHACO Right 09/15/2017   Procedure: CATARACT EXTRACTION PHACO AND INTRAOCULAR LENS  PLACEMENT (IOC);  Surgeon: Rutherford Guys, MD;  Location: [facility];  Service: Ophthalmology;  Laterality: Right;  CDE: 12.22  . CATARACT EXTRACTION W/PHACO Left 09/29/2017   Procedure: CATARACT EXTRACTION PHACO AND INTRAOCULAR LENS PLACEMENT (IOC);  Surgeon: Rutherford Guys, MD;  Location: [facility];  Service: Ophthalmology;  Laterality: Left;  CDE: 10.02  . COLONOSCOPY N/A 12/24/2015   Dr. Fields:moderate sized internal hemorrhoids/moderate diverticulosis, negative microscopic colitis   . EP IMPLANTABLE DEVICE  04/18/2015   BV ICD  . EP IMPLANTABLE DEVICE N/A 04/18/2015   Procedure: BiV ICD Insertion  CRT-D;  Surgeon: Deboraha Sprang, MD;  Location: [place] [facility];  Service: Cardiovascular;  Laterality: N/A;  . LEFT AND RIGHT HEART CATHETERIZATION WITH CORONARY ANGIOGRAM N/A 07/17/2014   Procedure: LEFT AND RIGHT HEART CATHETERIZATION WITH CORONARY ANGIOGRAM;  Surgeon: Blane Ohara, MD;  Location: [facility] CATH LAB;  Service: Cardiovascular;  Laterality: N/A;  . PERCUTANEOUS CORONARY STENT INTERVENTION (PCI-S)  07/17/2014   Procedure: PERCUTANEOUS CORONARY STENT INTERVENTION (PCI-S);  Surgeon: Blane Ohara, MD;  Location: [facility] CATH LAB;  Service: Cardiovascular;;  Distal RCA  . S/P Hysterectomy       Allergies  Allergen Reactions  . Lactose Intolerance (Gi) Other (See Comments)    GI Upset   . Sulfa Antibiotics Rash      Family History  Problem Relation Age of Onset  . Hypertension Sister   . Diabetes Mellitus II Sister   . Hypertension Brother   . Diabetes Mellitus II Brother   . Hypertension Brother   . Diabetes Mellitus II Brother   . Hypertension Brother   . Diabetes Mellitus II Brother   . Colon cancer Neg Hx      Social History Nicole Clements reports that she has been smoking cigarettes. She started smoking about 59 years ago. She has a 25.00 pack-year smoking history. She has never used smokeless tobacco. Nicole Clements reports no history of alcohol use.   Review of Systems CONSTITUTIONAL: No weight loss, fever, chills, weakness or fatigue.  HEENT: Eyes: No visual loss, blurred vision, double vision or yellow sclerae.No hearing loss, sneezing, congestion, runny nose or sore throat.  SKIN: No rash or itching.  CARDIOVASCULAR: per hpi RESPIRATORY: No shortness of breath, cough or sputum.  GASTROINTESTINAL: No anorexia, nausea, vomiting or diarrhea. No abdominal pain or blood.  GENITOURINARY: No burning on urination, no polyuria NEUROLOGICAL: No headache, dizziness, syncope, paralysis, ataxia, numbness or tingling in the extremities. No change in bowel or bladder control.   MUSCULOSKELETAL: No muscle, back pain, joint pain or stiffness.  LYMPHATICS: No enlarged nodes. No history of splenectomy.  PSYCHIATRIC: No history of depression or anxiety.  ENDOCRINOLOGIC: No reports of sweating, cold or heat intolerance. No polyuria or polydipsia.  [name]   Physical Examination Today's Vitals   03/26/21 1402  BP: 115/60  Pulse: 88  SpO2: 98%  Weight: 145 lb (65.8 kg)  Height: _0  (1.575 m)   Body mass index is 26.52 kg/m.  Gen: resting comfortably, no acute distress HEENT: no scleral icterus, pupils equal round and reactive, no palptable cervical adenopathy,  CV: RRR, no m/rg, no jvd Resp: Clear to auscultation bilaterally GI: abdomen is soft, non-tender, non-distended, normal bowel sounds, no hepatosplenomegaly MSK: extremities are warm, no edema.  Skin: warm, no rash Neuro:  no focal deficits Psych: appropriate affect   Diagnostic Studies  02/2013 Cath Procedural Findings:  Hemodynamics:  AO 116/45 with a mean of 70  LV 120/18  Coronary angiography:  Coronary dominance: right  Left mainstem: The left main is patent with 30% distal left main stenosis as the vessel trifurcates into the LAD, intermediate Khaleel Beckom, and left circumflex.  Left anterior descending (LAD): The LAD is patent with diffuse disease noted. There is minimal calcification present. The first diagonal is patent with 30-50% diffuse disease. The LAD after the first septal perforator has 40-50% stenosis. The vessel reaches the apex and wraps around the left ventricular apex without significant stenosis.  Left circumflex (LCx): The intermediate Caelin Rosen is small in caliber and diffusely diseased with 50-60% proximal vessel stenosis the AV groove circumflex is diffusely diseased with 50% ostial stenosis and 30% mid stenosis it supplies 2 small posterolateral Octavio Matheney  Right coronary artery (RCA): The RCA is diffusely diseased. There is significant tortuosity, especially around the junction  of the mid and distal vessel. The vessel is diffusely calcified. The mid vessel has tandem 50% stenoses. The distal vessel has 80-90 % stenosis just before the bifurcation of the PDA and posterior AV segment. The PDA and posterior AV segment with 3 posterolateral branches are patent.  Left ventriculography: Left ventricular systolic function is moderately depressed. There is severe hypokinesis of the basal and midinferior walls. The anterolateral and apical walls contract normally. The left ventricular ejection fraction is estimated at 45%.  Abdominal aortogram: The abdominal aorta is patent. The renal arteries are single bilaterally and they are widely patent. There appears to be nonobstructive stenosis of the right common iliac artery at the aortoiliac junction. The left common iliac artery appears to have significant ostial stenosis estimated at at least 75% angiographically. The visualized portions of the external iliac and common femoral arteries are patent  Final Conclusions:  1. Severe single-vessel coronary artery disease involving the distal right coronary artery 2. Moderate LV dysfunction with segmental severe hypokinesis of the inferior wall and normal contraction of the antero-apex with estimated left ventricular ejection fraction of 45%. 3. Severe left common iliac artery stenosis Recommendations: The patient has severe distal RCA disease. She has a known history of inferior wall infarction and remote PCA. This fits with that clinical history. She has nonobstructive disease of the left coronary artery. Her left ventricular ejection fraction by ventriculography is clearly greater than 35%. With her history of previous MI, I do not think PCI is indicated with an absence of angina. Will carefully discussed symptoms with the patient, but I am inclined to treat her medically. Will also review indication for consideration of peripheral intervention for her iliac disease.   01/2013  Echo Study Conclusions  - Left ventricle: The cavity size was mildly to moderately dilated. Wall thickness was normal. Systolic function was severely reduced. The estimated ejection fraction was 25%. Severe diffuse hypokinesis. - Aortic valve: Mildly calcified annulus. Trileaflet. - Mitral valve: Calcified annulus. - Left atrium: The atrium was moderately dilated. - Right ventricle: The cavity size was normal. Wall thickness was mildly to moderately increased. - Right atrium: The atrium was mildly dilated. - Atrial septum: No defect or patent foramen ovale was identified. Impressions:  - Compared to the prior study of 03/13/11, there has been substantial deterioration in LV systolic function.  07/2014 Echo Study Conclusions  - Left ventricle: The cavity size was severely dilated. Wall thickness was normal. The estimated ejection fraction was 20%. Diffuse hypokinesis. There is akinesis of the inferolateral and inferior myocardium. Features are consistent with a pseudonormal left ventricular filling pattern, with concomitant abnormal relaxation and increased filling pressure (grade 2 diastolic dysfunction). Doppler  parameters are consistent with high ventricular filling pressure. - Mitral valve: There was mild to moderate regurgitation. - Left atrium: The atrium was moderately dilated.  Impressions:  - Global hypokinesis; inferior and inferolateral akinesis; overall severely reduced LV function; grade 2 diastolic dysfunction with elevated left ventricular filling pressures, moderate LAE; mild to moderate MR. 07/2014 Cath PROCEDURAL FINDINGS  Hemodynamics:  AO 122/48  LV 118/14  RA 9  RV 41/11  PA 37/5 with a mean of 21  PCWP 27  Oxygen saturations  AO 90  PA 56  Cardiac output 3.5  Cardiac index 2.0  Coronary angiography:  Coronary dominance: right  Left mainstem: The left mainstem is patent with 30% distal left main stenosis.  Left anterior  descending (LAD): The LAD is diffusely diseased in the midportion. There are sequential 50% stenoses, unchanged from previous study. The first diagonal Jamisen Duerson is large in distribution with diffuse nonobstructive disease noted. The mid and distal LAD are patent without significant disease.  Left circumflex (LCx): The left circumflex has 50% ostial stenosis, 50% mid vessel stenosis, and to OM branches with no significant disease. There is an intermediate Brithney Bensen is diffusely diseased with up to 70% stenosis. This vessel is small in caliber.  Right coronary artery (RCA): Dominant vessel. Tortuous with diffuse nonobstructive stenosis throughout the proximal and mid portions. There are diffuse 50% stenosis present. The distal vessel has an ulcerated-appearing irregular 90% stenosis involving the bifurcation of the PDA and posterolateral branches. The posterolateral branches are patent with mild diffuse disease. The PDA arises an acute angulation with 30-40% stenosis. The mid body of the PDA has an 80-90% stenosis in an area where the vessel is too small for PCI.  Left ventriculography: The ventricle appears the synchronous. The basal and midinferior wall are akinetic. The basal inferolateral wall is severely hypokinetic. The anterolateral wall and apex contract normally. The estimated LVEF is 30%.  PCI Procedure Note: Following the diagnostic procedure, the decision was made to proceed with PCI of the distal RCA. The lesion is complex, there is a large amount of myocardium involved. I felt there was a risk of potentially compromising the PDA Rodricus Candelaria, but overall in my opinion the risk/benefit was favorable for PCI considering the severe stenosis involving the entire RCA distribution. The sheath was upsized to a 6 [place]. Weight-based bivalirudin was given for anticoagulation. The patient was loaded with Plavix 600 mg on the table. Once a therapeutic ACT was achieved, a 6 [place] JR 4 guide catheter was inserted. A  cougar coronary guidewire was used to cross the lesion into the PLA Naleah Kofoed. The lesion was predilated with a 2.5 x 15 mm balloon. The lesion was then stented with a 3.0 by 25m Promus DES stent. The stent was postdilated with a 3.25 mm noncompliant balloon to 18 atmospheres. The PDA was stented across as the stent extended from the distal RCA proximal PLA Gaylyn Berish. The PDA was totally occluded. I made a long attempt with a whisper wire using multiple bands on the tip of the wire, but all of these attempts were unsuccessful at rewiring the PDA. The patient was completely asymptomatic and specifically denied any chest pain. Her hemodynamics remained stable and there were no changes in her rhythm or blood pressure. A final image of the left coronary artery was obtained and this demonstrated a collateral from the LAD to the PDA Ifeoluwa Bartz. Following PCI, there was 0% residual stenosis and TIMI-3 flow. Final angiography confirmed an excellent result. Femoral hemostasis was achieved  will be achieved with manual hemostasis. The patient tolerated the PCI procedure well. There were no immediate procedural complications. The patient was transferred to the post catheterization recovery area for further monitoring.  PCI Data:  Vessel - RCA/Segment - distal  Percent Stenosis (pre) 90  TIMI-flow 3  Stent 3.0 by 86m Promus DES  Percent Stenosis (post) 0  TIMI-flow (post) 3  Contrast: 175 cc Omnipaque  Radiation dose/Fluoro time: 15.5 minutes  Estimated Blood Loss: Minimal  Final Conclusions:  1. Nonobstructive disease involving the LAD, left circumflex, and OM/diagonal side branches 2. Severe stenosis of the distal RCA bifurcation, treated successfully with a drug-eluting stent into the PLA Bodey Frizell with residual occlusion of the PDA Shady Bradish 3. Severe segmental LV systolic dysfunction Recommendations: Dual antiplatelet therapy with aspirin and Plavix for at least 12 months. Will cycle cardiac enzymes since  the PDA Mayukha Symmonds is occluded after stenting.  10/2014 Echo Study Conclusions  - Procedure narrative: Transthoracic echocardiography. Image quality was suboptimal. The study was technically difficult, as a result of poor sound wave transmission and body habitus. - Left ventricle: Systolic function is severely reduced, estimated EF 15-20%. The cavity size was severely dilated. Doppler parameters are consistent with abnormal left ventricular relaxation (grade 1 diastolic dysfunction). Doppler parameters are consistent with high ventricular filling pressure. Medial E/e&' 24. - Regional wall motion abnormality: Akinesis of the basal-mid inferior and basal inferolateral myocardium; severe hypokinesis of the apical inferior and mid inferolateral myocardium. Severe, diffuse hypokinesis. - Ventricular septum: Septal motion showed abnormal function and dyssynergy. These changes are consistent with intraventricular conduction delay. - Aortic valve: Mildly calcified annulus. Trileaflet; mildly thickened leaflets. - Mitral valve: Mildly dilated annulus. Mildly thickened leaflets . There was moderate eccentric regurgitation. 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 - Left atrium: The atrium was severely dilated. Volume/bsa, S: 47.1 ml/m^2. - Right ventricle: Systolic function was mildly reduced. - Right atrium: The atrium was mildly dilated. - Atrial septum: There was increased thickness of the septum, consistent with lipomatous hypertrophy. - Tricuspid valve: There was mild regurgitation. - Pulmonary arteries: PA peak pressure: 42 mm Hg (S). Mildly elevated pulmonary pressures. - Pericardium, extracardiac: There was a small pericardial effusion, with no evidence of tamponade physiology.  04/2015 Carotid [place]: 1. Mild (1-49%) stenosis of the proximal right internal carotid artery secondary to smooth heterogeneous atherosclerotic  plaque. No interval change compared to recent prior imaging from earlier this month. 2. Mild (1-49%) stenosis of the proximal left internal carotid artery secondary to heterogeneous and irregular atherosclerotic plaque. No interval change compared to recent prior imaging from earlier this month. 3. Vertebral arteries remain patent with normal antegrade flow. Signed,    10/2015 echo Study Conclusions  - Left ventricle: The cavity size was moderately to severely dilated. Wall thickness was normal. Systolic function was severely reduced. The estimated ejection fraction was in the range of 25% to 30%. Diffuse hypokinesis. There is akinesis of the basalanteroseptal and anterior myocardium. Doppler parameters are consistent with abnormal left ventricular relaxation (grade 1 diastolic dysfunction). - Mitral valve: Calcified annulus. Mildly calcified leaflets . There was mild regurgitation. - Right atrium: Central venous pressure (est): 3 mm Hg. - Atrial septum: No defect or patent foramen ovale was identified. - Tricuspid valve: There was trivial regurgitation. - Pulmonary arteries: PA peak pressure: 27 mm Hg (S). - Pericardium, extracardiac: A prominent pericardial fat pad was present.  Impressions:  - Moderate to severe LV chamber dilatation with LVEF approximately 25-30%. There is diffuse hypokinesis  with near akinesis of the basal anteroseptal and anterior myocardium. Compared to the previous study from June 2016 there has been some improvement in LVEF. Grade 1 diastolic dysfunction. MAC with mild mitral regurgitation. Trivial tricuspid regurgitation with PASP 27 mmHg.  Transthoracic echocardiography. M-mode, complete 2D, spectral Doppler, and color Doppler. Birthdate: Patient birthdate: [birth year]. Age: Patient is 80 yr old. Sex: Gender: female. BMI: 27.5 kg/m^2. Blood pressure:   98/47 Patient status: Inpatient. Study date: Study  date: 10/04/2015. Study time: 11:27 AM. Location: Echo laboratory   Assessment and Plan  1. CAD - no recent symptoms, conitnue current meds   2. Chronic systolic HF - Medical therpay limited by soft bp's and renal insufficiency -several years since last echo. Repeat study, if ongoing dysfunction would try starting farxiga.   3. Hyperlipidemia -statin changedpreviouslydue to cost, -at goal, continue pravastatin  F/u 4 months      Arnoldo Lenis, M.D.

## 2021-04-02 ENCOUNTER — Ambulatory Visit (INDEPENDENT_AMBULATORY_CARE_PROVIDER_SITE_OTHER): Payer: Medicare Other

## 2021-04-02 DIAGNOSIS — I5022 Chronic systolic (congestive) heart failure: Secondary | ICD-10-CM

## 2021-04-02 LAB — CUP PACEART REMOTE DEVICE CHECK
Battery Remaining Longevity: 17 mo
Battery Voltage: 2.9 V
Brady Statistic AP VP Percent: 98.34 %
Brady Statistic AP VS Percent: 1.36 %
Brady Statistic AS VP Percent: 0.3 %
Brady Statistic AS VS Percent: 0.01 %
Brady Statistic RA Percent Paced: 98.44 %
Brady Statistic RV Percent Paced: 89.06 %
Date Time Interrogation Session: 20220531001804
HighPow Impedance: 59 Ohm
Implantable Lead Implant Date: 20160615
Implantable Lead Implant Date: 20160615
Implantable Lead Implant Date: 20160615
Implantable Lead Location: 753858
Implantable Lead Location: 753859
Implantable Lead Location: 753860
Implantable Lead Model: 4398
Implantable Lead Model: 5076
Implantable Pulse Generator Implant Date: 20160615
Lead Channel Impedance Value: 1026 Ohm
Lead Channel Impedance Value: 1121 Ohm
Lead Channel Impedance Value: 1121 Ohm
Lead Channel Impedance Value: 399 Ohm
Lead Channel Impedance Value: 399 Ohm
Lead Channel Impedance Value: 399 Ohm
Lead Channel Impedance Value: 418 Ohm
Lead Channel Impedance Value: 475 Ohm
Lead Channel Impedance Value: 475 Ohm
Lead Channel Impedance Value: 551 Ohm
Lead Channel Impedance Value: 665 Ohm
Lead Channel Impedance Value: 665 Ohm
Lead Channel Impedance Value: 760 Ohm
Lead Channel Pacing Threshold Amplitude: 0.5 V
Lead Channel Pacing Threshold Amplitude: 0.5 V
Lead Channel Pacing Threshold Amplitude: 1.5 V
Lead Channel Pacing Threshold Pulse Width: 0.4 ms
Lead Channel Pacing Threshold Pulse Width: 0.4 ms
Lead Channel Pacing Threshold Pulse Width: 0.4 ms
Lead Channel Sensing Intrinsic Amplitude: 0.875 mV
Lead Channel Sensing Intrinsic Amplitude: 0.875 mV
Lead Channel Sensing Intrinsic Amplitude: 12.5 mV
Lead Channel Sensing Intrinsic Amplitude: 12.5 mV
Lead Channel Setting Pacing Amplitude: 1.5 V
Lead Channel Setting Pacing Amplitude: 2 V
Lead Channel Setting Pacing Amplitude: 2.5 V
Lead Channel Setting Pacing Pulse Width: 0.4 ms
Lead Channel Setting Pacing Pulse Width: 0.4 ms
Lead Channel Setting Sensing Sensitivity: 0.45 mV

## 2021-04-03 ENCOUNTER — Ambulatory Visit (INDEPENDENT_AMBULATORY_CARE_PROVIDER_SITE_OTHER): Payer: Medicare Other

## 2021-04-03 ENCOUNTER — Telehealth: Payer: Self-pay

## 2021-04-03 DIAGNOSIS — Z95810 Presence of automatic (implantable) cardiac defibrillator: Secondary | ICD-10-CM | POA: Diagnosis not present

## 2021-04-03 DIAGNOSIS — I5022 Chronic systolic (congestive) heart failure: Secondary | ICD-10-CM | POA: Diagnosis not present

## 2021-04-03 NOTE — Progress Notes (Signed)
EPIC Encounter for ICM Monitoring  Patient Name: Nicole Clements is a 80 y.o. female Date: 04/03/2021 Primary Care Physican: Lupita Raider, MD Primary Cardiologist:Branch Electrophysiologist: Graciela Husbands Nephrologist: Celene Skeen Pacing:97.2% 3/21/2022Weight: 143lbs  Attempted call to patient and unable to reach.  Left detailed message per DPR regarding transmission. Transmission reviewed.   Optivol thoracic impedancesuggestingpossible fluid accumulation starting 03/28/2021 and trending back toward baseline.  Prescribed:  Furosemide 20 mgTake 20 mg daily. You may take an extra 20 mg daily as needed for swelling.  Potassium 20 mEq take 1 tabletdaily  Spironolactone 25 mg take 0.5 tablet (12.5 mg total) twice a day  Lab 10/05/2020 Creatinine 1.56, BUN 24, Potassium 5.4, Sodium 124 GFR 32-39 10/22/2020Creatinine 1.57, BUN 23, Potassium 5.6, Sodium 127, [id number]  Recommendations:Left voice mail with ICM number and encouraged to call if experiencing any fluid symptoms.  Follow-up plan: ICM clinic phone appointment on6/05/2021 to recheck fluid levels.91 day device clinic remote scheduled8/29/2022.  EP/Cardiology Office Visits: 08/01/2021 with Dr Wyline Mood.   Copy of ICM check sent to Dr.Klein.   3 month ICM trend: 04/02/2021.    1 Year ICM trend:       Karie Soda, RN 04/03/2021 11:12 AM

## 2021-04-03 NOTE — Telephone Encounter (Signed)
Remote ICM transmission received.  Attempted call to patient regarding ICM remote transmission and left detailed message per DPR.  Advised to return call for any fluid symptoms or questions. Next ICM remote transmission scheduled 04/09/2021.     

## 2021-04-09 ENCOUNTER — Ambulatory Visit (INDEPENDENT_AMBULATORY_CARE_PROVIDER_SITE_OTHER): Payer: Medicare Other

## 2021-04-09 DIAGNOSIS — I5022 Chronic systolic (congestive) heart failure: Secondary | ICD-10-CM

## 2021-04-09 DIAGNOSIS — Z95810 Presence of automatic (implantable) cardiac defibrillator: Secondary | ICD-10-CM

## 2021-04-10 DIAGNOSIS — Z961 Presence of intraocular lens: Secondary | ICD-10-CM | POA: Diagnosis not present

## 2021-04-10 NOTE — Progress Notes (Signed)
EPIC Encounter for ICM Monitoring  Patient Name: Nicole Clements is a 80 y.o. female Date: 04/10/2021 Primary Care Physican: Lupita Raider, MD Primary Cardiologist:Branch Electrophysiologist: Graciela Husbands Nephrologist: Celene Skeen Pacing:96.8% 5/24/2022Office Weight: 145lbs  Transmission reviewed.   Optivol thoracic impedancesuggestingpossible fluid level returned to normal.  Prescribed:  Furosemide 20 mgTake 20 mg daily. You may take an extra 20 mg daily as needed for swelling.  Potassium 20 mEq take 1 tabletdaily  Spironolactone 25 mg take 0.5 tablet (12.5 mg total) twice a day  Lab 10/05/2020 Creatinine 1.56, BUN 24, Potassium 5.4, Sodium 124 GFR 32-39 10/22/2020Creatinine 1.57, BUN 23, Potassium 5.6, Sodium 127, [id number]  Recommendations:No changes.  Follow-up plan: ICM clinic phone appointment on7/03/2021.91 day device clinic remote scheduled8/29/2022.  EP/Cardiology Office Visits: 08/01/2021 with Dr Wyline Mood.   Copy of ICM check sent to Dr.Klein.   3 month ICM trend: 04/09/2021.    1 Year ICM trend:       Karie Soda, RN 04/10/2021 12:04 PM

## 2021-04-11 ENCOUNTER — Other Ambulatory Visit (HOSPITAL_COMMUNITY): Payer: Medicare Other

## 2021-04-25 NOTE — Progress Notes (Signed)
Remote ICD transmission.   

## 2021-05-01 ENCOUNTER — Other Ambulatory Visit: Payer: Self-pay | Admitting: Cardiology

## 2021-05-02 ENCOUNTER — Ambulatory Visit (INDEPENDENT_AMBULATORY_CARE_PROVIDER_SITE_OTHER): Payer: Medicare Other

## 2021-05-02 DIAGNOSIS — I5022 Chronic systolic (congestive) heart failure: Secondary | ICD-10-CM

## 2021-05-02 LAB — ECHOCARDIOGRAM COMPLETE
AR max vel: 1.13 cm2
AV Area VTI: 1.14 cm2
AV Area mean vel: 1.02 cm2
AV Mean grad: 2.5 mmHg
AV Peak grad: 5.2 mmHg
Ao pk vel: 1.14 m/s
Area-P 1/2: 1.97 cm2
Calc EF: 27.3 %
MV M vel: 4.39 m/s
MV Peak grad: 77.1 mmHg
Radius: 0.4 cm
S' Lateral: 5.54 cm
Single Plane A2C EF: 28.5 %
Single Plane A4C EF: 26.1 %

## 2021-05-13 ENCOUNTER — Telehealth: Payer: Self-pay

## 2021-05-13 ENCOUNTER — Ambulatory Visit (INDEPENDENT_AMBULATORY_CARE_PROVIDER_SITE_OTHER): Payer: Medicare Other

## 2021-05-13 DIAGNOSIS — I5022 Chronic systolic (congestive) heart failure: Secondary | ICD-10-CM

## 2021-05-13 DIAGNOSIS — Z95810 Presence of automatic (implantable) cardiac defibrillator: Secondary | ICD-10-CM

## 2021-05-13 NOTE — Telephone Encounter (Signed)
Remote ICM transmission received.  Attempted call to patient regarding ICM remote transmission and no answer or voice mail option.  

## 2021-05-13 NOTE — Progress Notes (Signed)
EPIC Encounter for ICM Monitoring  Patient Name: Nicole Clements is a 80 y.o. female Date: 05/13/2021 Primary Care Physican: Lupita Raider, MD Primary Cardiologist: Wyline Mood Electrophysiologist: Graciela Husbands Nephrologist: Celene Skeen Pacing: 95.4%    03/26/2021 Office Weight: 145 lbs   Attempted call to patient and unable to reach.  Transmission reviewed.    Optivol thoracic impedance suggesting possible fluid accumulation since 05/03/2021.   Prescribed:   Furosemide 20 mg Take 20 mg daily. You may take an extra 20 mg daily as needed for swelling. Potassium 20 mEq take 1 tablet daily Spironolactone 25 mg take 0.5 tablet (12.5 mg total) twice a day   Lab 10/05/2020 Creatinine 1.56, BUN 24, Potassium 5.4, Sodium 124 GFR 32-39 08/25/2019 Creatinine 1.57, BUN 23, Potassium 5.6, Sodium 127, GFR 32-39   Recommendations:  Unable to reach.     Follow-up plan: ICM clinic phone appointment on 05/20/2021 (manual) to recheck fluid levels.  91 day device clinic remote scheduled 07/01/2021.     EP/Cardiology Office Visits:  08/01/2021 with Dr Wyline Mood.     Copy of ICM check sent to Dr. Graciela Husbands.   3 month ICM trend: 05/13/2021.    1 Year ICM trend:       Karie Soda, RN 05/13/2021 3:38 PM

## 2021-05-17 ENCOUNTER — Telehealth: Payer: Self-pay | Admitting: *Deleted

## 2021-05-17 DIAGNOSIS — Z23 Encounter for immunization: Secondary | ICD-10-CM | POA: Diagnosis not present

## 2021-05-17 MED ORDER — DAPAGLIFLOZIN PROPANEDIOL 5 MG PO TABS: 5.0000 mg | ORAL_TABLET | Freq: Every day | ORAL | 6 refills | Status: DC

## 2021-05-17 NOTE — Telephone Encounter (Signed)
Lesle Chris, LPN  4/65/0354  5:08 PM EDT Back to Top     Notified, copy to pcp.  She is willing to try the Marcelline Deist - will send newprescription to [facility] on [address] in [place].

## 2021-05-17 NOTE — Telephone Encounter (Signed)
-----   Message from Antoine Poche, MD sent at 05/09/2021  4:07 PM EDT ----- Heart function remains weak but stable at 25-30%. Can we start farxiga 10mg  daily, this is a new medication for heart failure that has been shown to have a lot of benefit   MD

## 2021-05-23 ENCOUNTER — Telehealth: Payer: Self-pay

## 2021-05-23 NOTE — Telephone Encounter (Signed)
The patient tried to send missed ICM transmission. The patient handheld needs to be replace. She should receive it in 7-10 business days.

## 2021-05-28 NOTE — Progress Notes (Addendum)
No ICM remote transmission received for 05/20/2021 and next ICM transmission scheduled for 06/17/2021.

## 2021-06-17 ENCOUNTER — Ambulatory Visit (INDEPENDENT_AMBULATORY_CARE_PROVIDER_SITE_OTHER): Payer: Medicare Other

## 2021-06-17 DIAGNOSIS — I5022 Chronic systolic (congestive) heart failure: Secondary | ICD-10-CM | POA: Diagnosis not present

## 2021-06-17 DIAGNOSIS — Z95810 Presence of automatic (implantable) cardiac defibrillator: Secondary | ICD-10-CM | POA: Diagnosis not present

## 2021-06-19 NOTE — Progress Notes (Signed)
EPIC Encounter for ICM Monitoring  Patient Name: Nicole Clements is a 80 y.o. female Date: 06/19/2021 Primary Care Physican: Lupita Raider, MD Primary Cardiologist: Wyline Mood Electrophysiologist: Graciela Husbands Nephrologist: Celene Skeen Pacing: 93.2%    03/26/2021 Office Weight: 145 lbs  Time in AT/AF <0.1 hr/day (<0.1%)   Attempted call to patient and unable to reach.  Transmission reviewed.    Optivol thoracic impedance suggesting possible fluid accumulation since 05/03/2021 and returned to baseline transmission date 06/17/2021.   Prescribed:   Furosemide 20 mg Take 20 mg daily. You may take an extra 20 mg daily as needed for swelling. Potassium 20 mEq take 1 tablet daily Spironolactone 25 mg take 0.5 tablet (12.5 mg total) twice a day   Lab 10/05/2020 Creatinine 1.56, BUN 24, Potassium 5.4, Sodium 124 GFR 32-39 08/25/2019 Creatinine 1.57, BUN 23, Potassium 5.6, Sodium 127, GFR 32-39   Recommendations:  Unable to reach.     Follow-up plan: ICM clinic phone appointment on 07/22/2021.  91 day device clinic remote scheduled 07/01/2021.     EP/Cardiology Office Visits:  08/01/2021 with Dr Wyline Mood.     Copy of ICM check sent to Dr. Graciela Husbands.   3 month ICM trend: 06/17/2021.    1 Year ICM trend:       Karie Soda, RN 06/19/2021 3:47 PM

## 2021-07-01 ENCOUNTER — Ambulatory Visit (INDEPENDENT_AMBULATORY_CARE_PROVIDER_SITE_OTHER): Payer: Medicare Other

## 2021-07-01 DIAGNOSIS — I255 Ischemic cardiomyopathy: Secondary | ICD-10-CM

## 2021-07-01 LAB — CUP PACEART REMOTE DEVICE CHECK
Battery Remaining Longevity: 13 mo
Battery Voltage: 2.88 V
Brady Statistic AP VP Percent: 97.42 %
Brady Statistic AP VS Percent: 2.44 %
Brady Statistic AS VP Percent: 0.13 %
Brady Statistic AS VS Percent: 0.01 %
Brady Statistic RA Percent Paced: 95.98 %
Brady Statistic RV Percent Paced: 85.85 %
Date Time Interrogation Session: 20220829043823
HighPow Impedance: 68 Ohm
Implantable Lead Implant Date: 20160615
Implantable Lead Implant Date: 20160615
Implantable Lead Implant Date: 20160615
Implantable Lead Location: 753858
Implantable Lead Location: 753859
Implantable Lead Location: 753860
Implantable Lead Model: 4398
Implantable Lead Model: 5076
Implantable Pulse Generator Implant Date: 20160615
Lead Channel Impedance Value: 1178 Ohm
Lead Channel Impedance Value: 1178 Ohm
Lead Channel Impedance Value: 1197 Ohm
Lead Channel Impedance Value: 399 Ohm
Lead Channel Impedance Value: 418 Ohm
Lead Channel Impedance Value: 418 Ohm
Lead Channel Impedance Value: 456 Ohm
Lead Channel Impedance Value: 513 Ohm
Lead Channel Impedance Value: 513 Ohm
Lead Channel Impedance Value: 551 Ohm
Lead Channel Impedance Value: 703 Ohm
Lead Channel Impedance Value: 722 Ohm
Lead Channel Impedance Value: 779 Ohm
Lead Channel Pacing Threshold Amplitude: 0.5 V
Lead Channel Pacing Threshold Amplitude: 0.625 V
Lead Channel Pacing Threshold Amplitude: 1.375 V
Lead Channel Pacing Threshold Pulse Width: 0.4 ms
Lead Channel Pacing Threshold Pulse Width: 0.4 ms
Lead Channel Pacing Threshold Pulse Width: 0.4 ms
Lead Channel Sensing Intrinsic Amplitude: 1.875 mV
Lead Channel Sensing Intrinsic Amplitude: 1.875 mV
Lead Channel Sensing Intrinsic Amplitude: 12.875 mV
Lead Channel Sensing Intrinsic Amplitude: 12.875 mV
Lead Channel Setting Pacing Amplitude: 1.5 V
Lead Channel Setting Pacing Amplitude: 2 V
Lead Channel Setting Pacing Amplitude: 2.5 V
Lead Channel Setting Pacing Pulse Width: 0.4 ms
Lead Channel Setting Pacing Pulse Width: 0.4 ms
Lead Channel Setting Sensing Sensitivity: 0.45 mV

## 2021-07-12 ENCOUNTER — Other Ambulatory Visit (HOSPITAL_COMMUNITY): Payer: Self-pay | Admitting: Family Medicine

## 2021-07-12 DIAGNOSIS — Z1231 Encounter for screening mammogram for malignant neoplasm of breast: Secondary | ICD-10-CM

## 2021-07-12 NOTE — Progress Notes (Signed)
Remote ICD transmission.   

## 2021-07-22 ENCOUNTER — Ambulatory Visit (INDEPENDENT_AMBULATORY_CARE_PROVIDER_SITE_OTHER): Payer: Medicare Other

## 2021-07-22 DIAGNOSIS — Z95810 Presence of automatic (implantable) cardiac defibrillator: Secondary | ICD-10-CM | POA: Diagnosis not present

## 2021-07-22 DIAGNOSIS — I5022 Chronic systolic (congestive) heart failure: Secondary | ICD-10-CM | POA: Diagnosis not present

## 2021-07-24 NOTE — Progress Notes (Signed)
EPIC Encounter for ICM Monitoring  Patient Name: Nicole Clements is a 80 y.o. female Date: 07/24/2021 Primary Care Physican: Lupita Raider, MD Primary Cardiologist: Wyline Mood Electrophysiologist: Graciela Husbands Nephrologist: Celene Skeen Pacing: 93.1%    07/24/2021 Weight: 145 lbs   Time in AT/AF  0.0 hr/day (0.0%)   Spoke with patient and heart failure questions reviewed.  Pt asymptomatic for fluid accumulation and feeling well.   Optivol thoracic impedance suggesting normal fluid levels with minimal fluctuation indicating some days with possible fluid accumulation.   Prescribed:   Furosemide 20 mg Take 20 mg daily. You may take an extra 20 mg daily as needed for swelling. Potassium 20 mEq take 1 tablet daily Spironolactone 25 mg take 0.5 tablet (12.5 mg total) twice a day   Lab 10/05/2020 Creatinine 1.56, BUN 24, Potassium 5.4, Sodium 124 GFR 32-39   Recommendations:  No changes and encouraged to call if experiencing any fluid symptoms.   Follow-up plan: ICM clinic phone appointment on 08/26/2021.  91 day device clinic remote scheduled 09/30/2021.     EP/Cardiology Office Visits:  08/01/2021 with Dr Wyline Mood.     Copy of ICM check sent to Dr. Graciela Husbands.    3 month ICM trend: 07/22/2021.    1 Year ICM trend:       Karie Soda, RN 07/24/2021 10:26 AM

## 2021-08-01 ENCOUNTER — Encounter: Payer: Self-pay | Admitting: Cardiology

## 2021-08-01 ENCOUNTER — Ambulatory Visit: Payer: Medicare Other | Admitting: Cardiology

## 2021-08-01 ENCOUNTER — Ambulatory Visit (INDEPENDENT_AMBULATORY_CARE_PROVIDER_SITE_OTHER): Payer: Medicare Other | Admitting: Cardiology

## 2021-08-01 VITALS — BP 120/64 | HR 80 | Ht 62.5 in | Wt 142.8 lb

## 2021-08-01 DIAGNOSIS — I255 Ischemic cardiomyopathy: Secondary | ICD-10-CM

## 2021-08-01 DIAGNOSIS — I2510 Atherosclerotic heart disease of native coronary artery without angina pectoris: Secondary | ICD-10-CM | POA: Diagnosis not present

## 2021-08-01 DIAGNOSIS — N183 Chronic kidney disease, stage 3 unspecified: Secondary | ICD-10-CM

## 2021-08-01 DIAGNOSIS — I5022 Chronic systolic (congestive) heart failure: Secondary | ICD-10-CM | POA: Diagnosis not present

## 2021-08-01 DIAGNOSIS — E782 Mixed hyperlipidemia: Secondary | ICD-10-CM | POA: Diagnosis not present

## 2021-08-01 MED ORDER — DAPAGLIFLOZIN PROPANEDIOL 10 MG PO TABS: 10.0000 mg | ORAL_TABLET | Freq: Every day | ORAL | 6 refills | Status: DC

## 2021-08-01 MED ORDER — DAPAGLIFLOZIN PROPANEDIOL 10 MG PO TABS: 10.0000 mg | ORAL_TABLET | Freq: Every day | ORAL | 0 refills | Status: DC

## 2021-08-01 NOTE — Patient Instructions (Addendum)
Medication Instructions:  Begin Farxiga 10mg  daily  Continue all other medications.     Labwork: BMET, Mg - orders given today.  Office will contact with results via phone or letter.     Testing/Procedures: none  Follow-Up: 4 months   Any Other Special Instructions Will Be Listed Below (If Applicable).   If you need a refill on your cardiac medications before your next appointment, please call your pharmacy.

## 2021-08-01 NOTE — Progress Notes (Signed)
Clinical Summary Nicole Clements is a 80 y.o.female seen today for follow up of the following medical problems.    1. 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 Admit 07/2014 with CHF and troponin elevation - cath 07/2014, LM 30% distal, LAD mid 50%, LCX ostial 50% and mid 50%, small intermediate Eino Whitner 70%. RCA diffuse 50%, distal RCA with ulcerated 90% lesion, PDA 80-90% too small for PCI. LVEF 30%. Received DES to distal RCA, post procedure the PDA became occluded.   - echo 07/2014 LVEF 20%, inferior akinesis, grade II diastolic dysfunction. Repeat echo 10/2014 LVEF remains 15-20%.   - 04/2015 CRT-D device placed by Dr Caryl Comes. Normal function Jan 2018.  10/2015 echo LVEF 25-30%  - could not afford entresto, back on losartan.    04/2021 echo LVEF 25-30% - no recent edema, no SOB/DOE - was to start [employer], does not appear she got from pharmacy - 06/2021 normal device check.    2. Hyperlipidemia  - atorva was too expensive, changed to pravastatin by pcp      10/2020 TC 104 TG 73 HDL 40 LDL 64   3. CKD III - followed by pcp   Past Medical History:  Diagnosis Date   AICD (automatic cardioverter/defibrillator) present    CAD (coronary artery disease) April 2014   DES to PLA, occluded PDA 07/2014   CHF (congestive heart failure) (HCC)    Hypercholesterolemia    Hypertension    Ischemic cardiomyopathy    LVEF 25%-45%   IVCD (intraventricular conduction defect)    PVC's (premature ventricular contractions)    S/P colonoscopy August 2007   Hyperplastic polyps, rare sigmoid and descending colon diverticulosis, small internal hemorrhoids   STEMI (ST elevation myocardial infarction) ([place]) 07/22/1996     Allergies  Allergen Reactions   Lactose Intolerance (Gi) Other (See Comments)    GI Upset    Sulfa Antibiotics Rash     Current Outpatient Medications  Medication Sig Dispense Refill   acetaminophen (TYLENOL) 500 MG tablet Take 1,000 mg by mouth every 8 (eight)  hours as needed for mild pain or headache.     aspirin EC 81 MG tablet Take 1 tablet (81 mg total) by mouth daily. 90 tablet 3   carvedilol (COREG) 25 MG tablet TAKE 1 TABLET BY MOUTH TWICE DAILY 60 tablet 6   Cholecalciferol (VITAMIN D3) 2000 UNITS capsule Take 2,000 Units by mouth daily.     dapagliflozin propanediol (FARXIGA) 5 MG TABS tablet Take 1 tablet (5 mg total) by mouth daily before breakfast. 30 tablet 6   furosemide (LASIX) 20 MG tablet TAKE 1 TABLET EVERY DAY YOU MAY TAKE AN EXTRA 20 MG EVERY DAY AS NEEDED FOR SWELLING 180 tablet 3   losartan (COZAAR) 25 MG tablet TAKE 1 TABLET(25 MG) BY MOUTH DAILY 90 tablet 3   nitroGLYCERIN (NITROSTAT) 0.4 MG SL tablet Place 1 tablet (0.4 mg total) under the tongue every 5 (five) minutes x 3 doses as needed for chest pain. 25 tablet 2   Omega-3 Fatty Acids (FISH OIL) 1200 MG CAPS Take 1,200 mg by mouth daily.     potassium chloride SA (KLOR-CON) 20 MEQ tablet TAKE 1 TABLET(20 MEQ) BY MOUTH DAILY 90 tablet 2   pravastatin (PRAVACHOL) 80 MG tablet Take 80 mg by mouth daily.     spironolactone (ALDACTONE) 25 MG tablet TAKE 1/2 TABLET(12.5 MG) BY MOUTH TWICE DAILY 90 tablet 3   No current facility-administered medications for this visit.  Past Surgical History:  Procedure Laterality Date   ABDOMINAL HYSTERECTOMY     CARDIAC CATHETERIZATION  April 2014   Med Rx   CATARACT EXTRACTION W/PHACO Right 09/15/2017   Procedure: CATARACT EXTRACTION PHACO AND INTRAOCULAR LENS PLACEMENT (Ali Molina);  Surgeon: Rutherford Guys, MD;  Location: [facility];  Service: Ophthalmology;  Laterality: Right;  CDE: 12.22   CATARACT EXTRACTION W/PHACO Left 09/29/2017   Procedure: CATARACT EXTRACTION PHACO AND INTRAOCULAR LENS PLACEMENT (IOC);  Surgeon: Rutherford Guys, MD;  Location: [facility];  Service: Ophthalmology;  Laterality: Left;  CDE: 10.02   COLONOSCOPY N/A 12/24/2015   Dr. Fields:moderate sized internal hemorrhoids/moderate diverticulosis, negative microscopic colitis     EP IMPLANTABLE DEVICE  04/18/2015   BV ICD   EP IMPLANTABLE DEVICE N/A 04/18/2015   Procedure: BiV ICD Insertion CRT-D;  Surgeon: Deboraha Sprang, MD;  Location: [place] [facility];  Service: Cardiovascular;  Laterality: N/A;   LEFT AND RIGHT HEART CATHETERIZATION WITH CORONARY ANGIOGRAM N/A 07/17/2014   Procedure: LEFT AND RIGHT HEART CATHETERIZATION WITH CORONARY ANGIOGRAM;  Surgeon: Blane Ohara, MD;  Location: [facility] CATH LAB;  Service: Cardiovascular;  Laterality: N/A;   PERCUTANEOUS CORONARY STENT INTERVENTION (PCI-S)  07/17/2014   Procedure: PERCUTANEOUS CORONARY STENT INTERVENTION (PCI-S);  Surgeon: Blane Ohara, MD;  Location: [facility] CATH LAB;  Service: Cardiovascular;;  Distal RCA   S/P Hysterectomy       Allergies  Allergen Reactions   Lactose Intolerance (Gi) Other (See Comments)    GI Upset    Sulfa Antibiotics Rash      Family History  Problem Relation Age of Onset   Hypertension Sister    Diabetes Mellitus II Sister    Hypertension Brother    Diabetes Mellitus II Brother    Hypertension Brother    Diabetes Mellitus II Brother    Hypertension Brother    Diabetes Mellitus II Brother    Colon cancer Neg Hx      Social History Ms. Glennon reports that she has been smoking cigarettes. She started smoking about 59 years ago. She has a 25.00 pack-year smoking history. She has never used smokeless tobacco. Ms. Busbin reports no history of alcohol use.   Review of Systems CONSTITUTIONAL: No weight loss, fever, chills, weakness or fatigue.  HEENT: Eyes: No visual loss, blurred vision, double vision or yellow sclerae.No hearing loss, sneezing, congestion, runny nose or sore throat.  SKIN: No rash or itching.  CARDIOVASCULAR: per hpi RESPIRATORY: No shortness of breath, cough or sputum.  GASTROINTESTINAL: No anorexia, nausea, vomiting or diarrhea. No abdominal pain or blood.  GENITOURINARY: No burning on urination, no polyuria NEUROLOGICAL: No headache, dizziness,  syncope, paralysis, ataxia, numbness or tingling in the extremities. No change in bowel or bladder control.  MUSCULOSKELETAL: No muscle, back pain, joint pain or stiffness.  LYMPHATICS: No enlarged nodes. No history of splenectomy.  PSYCHIATRIC: No history of depression or anxiety.  ENDOCRINOLOGIC: No reports of sweating, cold or heat intolerance. No polyuria or polydipsia.  [name]   Physical Examination Today's Vitals   08/01/21 1117  BP: 120/64  Pulse: 80  SpO2: 100%  Weight: 142 lb 12.8 oz (64.8 kg)  Height: 5' 2.5" (1.588 m)   Body mass index is 25.7 kg/m.  Gen: resting comfortably, no acute distress HEENT: no scleral icterus, pupils equal round and reactive, no palptable cervical adenopathy,  CV: RRR, no m/r/g no jvd Resp: Clear to auscultation bilaterally GI: abdomen is soft, non-tender, non-distended, normal bowel sounds, no hepatosplenomegaly MSK: extremities  are warm, no edema.  Skin: warm, no rash Neuro:  no focal deficits Psych: appropriate affect   Diagnostic Studies  02/2013 Cath   Procedural Findings:   Hemodynamics:   AO 116/45 with a mean of 70   LV 120/18   Coronary angiography:   Coronary dominance: right   Left mainstem: The left main is patent with 30% distal left main stenosis as the vessel trifurcates into the LAD, intermediate Susano Cleckler, and left circumflex.   Left anterior descending (LAD): The LAD is patent with diffuse disease noted. There is minimal calcification present. The first diagonal is patent with 30-50% diffuse disease. The LAD after the first septal perforator has 40-50% stenosis. The vessel reaches the apex and wraps around the left ventricular apex without significant stenosis.   Left circumflex (LCx): The intermediate Ara Mano is small in caliber and diffusely diseased with 50-60% proximal vessel stenosis the AV groove circumflex is diffusely diseased with 50% ostial stenosis and 30% mid stenosis it supplies 2 small posterolateral Willella Harding   Right  coronary artery (RCA): The RCA is diffusely diseased. There is significant tortuosity, especially around the junction of the mid and distal vessel. The vessel is diffusely calcified. The mid vessel has tandem 50% stenoses. The distal vessel has 80-90 % stenosis just before the bifurcation of the PDA and posterior AV segment. The PDA and posterior AV segment with 3 posterolateral branches are patent.   Left ventriculography: Left ventricular systolic function is moderately depressed. There is severe hypokinesis of the basal and midinferior walls. The anterolateral and apical walls contract normally. The left ventricular ejection fraction is estimated at 45%.   Abdominal aortogram: The abdominal aorta is patent. The renal arteries are single bilaterally and they are widely patent. There appears to be nonobstructive stenosis of the right common iliac artery at the aortoiliac junction. The left common iliac artery appears to have significant ostial stenosis estimated at at least 75% angiographically. The visualized portions of the external iliac and common femoral arteries are patent   Final Conclusions:  1. Severe single-vessel coronary artery disease involving the distal right coronary artery   2. Moderate LV dysfunction with segmental severe hypokinesis of the inferior wall and normal contraction of the antero-apex with estimated left ventricular ejection fraction of 45%.   3. Severe left common iliac artery stenosis   Recommendations: The patient has severe distal RCA disease. She has a known history of inferior wall infarction and remote PCA. This fits with that clinical history. She has nonobstructive disease of the left coronary artery. Her left ventricular ejection fraction by ventriculography is clearly greater than 35%. With her history of previous MI, I do not think PCI is indicated with an absence of angina. Will carefully discussed symptoms with the patient, but I am inclined to treat her medically.  Will also review indication for consideration of peripheral intervention for her iliac disease.       01/2013 Echo   Study Conclusions  - Left ventricle: The cavity size was mildly to moderately dilated. Wall thickness was normal. Systolic function was severely reduced. The estimated ejection fraction was 25%. Severe diffuse hypokinesis. - Aortic valve: Mildly calcified annulus. Trileaflet. - Mitral valve: Calcified annulus. - Left atrium: The atrium was moderately dilated. - Right ventricle: The cavity size was normal. Wall thickness was mildly to moderately increased. - Right atrium: The atrium was mildly dilated. - Atrial septum: No defect or patent foramen ovale was identified. Impressions:  - Compared to the prior study of  03/13/11, there has been substantial deterioration in LV systolic function.   07/2014 Echo Study Conclusions  - Left ventricle: The cavity size was severely dilated. Wall thickness was normal. The estimated ejection fraction was 20%. Diffuse hypokinesis. There is akinesis of the inferolateral and inferior myocardium. Features are consistent with a pseudonormal left ventricular filling pattern, with concomitant abnormal relaxation and increased filling pressure (grade 2 diastolic dysfunction). Doppler parameters are consistent with high ventricular filling pressure. - Mitral valve: There was mild to moderate regurgitation. - Left atrium: The atrium was moderately dilated.  Impressions:  - Global hypokinesis; inferior and inferolateral akinesis; overall severely reduced LV function; grade 2 diastolic dysfunction with elevated left ventricular filling pressures, moderate LAE; mild to moderate MR.  07/2014 Cath PROCEDURAL FINDINGS   Hemodynamics:   AO 122/48   LV 118/14   RA 9   RV 41/11   PA 37/5 with a mean of 21   PCWP 27   Oxygen saturations   AO 90   PA 56   Cardiac output 3.5   Cardiac index 2.0   Coronary angiography:   Coronary  dominance: right   Left mainstem: The left mainstem is patent with 30% distal left main stenosis.   Left anterior descending (LAD): The LAD is diffusely diseased in the midportion. There are sequential 50% stenoses, unchanged from previous study. The first diagonal Keidra Withers is large in distribution with diffuse nonobstructive disease noted. The mid and distal LAD are patent without significant disease.   Left circumflex (LCx): The left circumflex has 50% ostial stenosis, 50% mid vessel stenosis, and to OM branches with no significant disease. There is an intermediate Naziah Weckerly is diffusely diseased with up to 70% stenosis. This vessel is small in caliber.   Right coronary artery (RCA): Dominant vessel. Tortuous with diffuse nonobstructive stenosis throughout the proximal and mid portions. There are diffuse 50% stenosis present. The distal vessel has an ulcerated-appearing irregular 90% stenosis involving the bifurcation of the PDA and posterolateral branches. The posterolateral branches are patent with mild diffuse disease. The PDA arises an acute angulation with 30-40% stenosis. The mid body of the PDA has an 80-90% stenosis in an area where the vessel is too small for PCI.   Left ventriculography: The ventricle appears the synchronous. The basal and midinferior wall are akinetic. The basal inferolateral wall is severely hypokinetic. The anterolateral wall and apex contract normally. The estimated LVEF is 30%.   PCI Procedure Note: Following the diagnostic procedure, the decision was made to proceed with PCI of the distal RCA. The lesion is complex, there is a large amount of myocardium involved. I felt there was a risk of potentially compromising the PDA Adaly Puder, but overall in my opinion the risk/benefit was favorable for PCI considering the severe stenosis involving the entire RCA distribution. The sheath was upsized to a 6 [place]. Weight-based bivalirudin was given for anticoagulation. The patient was loaded  with Plavix 600 mg on the table. Once a therapeutic ACT was achieved, a 6 [place] JR 4 guide catheter was inserted. A cougar coronary guidewire was used to cross the lesion into the PLA Arnez Stoneking. The lesion was predilated with a 2.5 x 15 mm balloon. The lesion was then stented with a 3.0 by 44m Promus DES stent. The stent was postdilated with a 3.25 mm noncompliant balloon to 18 atmospheres. The PDA was stented across as the stent extended from the distal RCA proximal PLA Rashaan Wyles. The PDA was totally occluded. I made a long attempt with  a whisper wire using multiple bands on the tip of the wire, but all of these attempts were unsuccessful at rewiring the PDA. The patient was completely asymptomatic and specifically denied any chest pain. Her hemodynamics remained stable and there were no changes in her rhythm or blood pressure. A final image of the left coronary artery was obtained and this demonstrated a collateral from the LAD to the PDA Katlynne Mckercher. Following PCI, there was 0% residual stenosis and TIMI-3 flow. Final angiography confirmed an excellent result. Femoral hemostasis was achieved will be achieved with manual hemostasis. The patient tolerated the PCI procedure well. There were no immediate procedural complications. The patient was transferred to the post catheterization recovery area for further monitoring.   PCI Data:   Vessel - RCA/Segment - distal   Percent Stenosis (pre) 90   TIMI-flow 3   Stent 3.0 by 75m Promus DES   Percent Stenosis (post) 0   TIMI-flow (post) 3   Contrast: 175 cc Omnipaque   Radiation dose/Fluoro time: 15.5 minutes   Estimated Blood Loss: Minimal   Final Conclusions:  1. Nonobstructive disease involving the LAD, left circumflex, and OM/diagonal side branches   2. Severe stenosis of the distal RCA bifurcation, treated successfully with a drug-eluting stent into the PLA Damaria Vachon with residual occlusion of the PDA Christl Fessenden   3. Severe segmental LV systolic dysfunction    Recommendations: Dual antiplatelet therapy with aspirin and Plavix for at least 12 months. Will cycle cardiac enzymes since the PDA Jassmine Vandruff is occluded after stenting.   10/2014 Echo Study Conclusions  - Procedure narrative: Transthoracic echocardiography. Image   quality was suboptimal. The study was technically difficult, as a   result of poor sound wave transmission and body habitus. - Left ventricle: Systolic function is severely reduced, estimated   EF 15-20%. The cavity size was severely dilated. Doppler   parameters are consistent with abnormal left ventricular   relaxation (grade 1 diastolic dysfunction). Doppler parameters   are consistent with high ventricular filling pressure. Medial   E/e&' 24. - Regional wall motion abnormality: Akinesis of the basal-mid   inferior and basal inferolateral myocardium; severe hypokinesis   of the apical inferior and mid inferolateral myocardium. Severe,   diffuse hypokinesis. - Ventricular septum: Septal motion showed abnormal function and   dyssynergy. These changes are consistent with intraventricular   conduction delay. - Aortic valve: Mildly calcified annulus. Trileaflet; mildly   thickened leaflets. - Mitral valve: Mildly dilated annulus. Mildly thickened leaflets .   There was moderate eccentric regurgitation. Likely functional due   to mitral annular dilatation. - Left atrium: The atrium was severely dilated. Volume/bsa, S: 47.1   ml/m^2. - Right ventricle: Systolic function was mildly reduced. - Right atrium: The atrium was mildly dilated. - Atrial septum: There was increased thickness of the septum,   consistent with lipomatous hypertrophy. - Tricuspid valve: There was mild regurgitation. - Pulmonary arteries: PA peak pressure: 42 mm Hg (S). Mildly   elevated pulmonary pressures. - Pericardium, extracardiac: There was a small pericardial   effusion, with no evidence of tamponade physiology.   04/2015 Carotid  [place]PRESS[place]: 1. Mild (1-49%) stenosis of the proximal right internal carotid artery secondary to smooth heterogeneous atherosclerotic plaque. No interval change compared to recent prior imaging from earlier this month. 2. Mild (1-49%) stenosis of the proximal left internal carotid artery secondary to heterogeneous and irregular atherosclerotic plaque. No interval change compared to recent prior imaging from earlier this month. 3. Vertebral arteries remain patent  with normal antegrade flow. Signed,       10/2015 echo Study Conclusions  - Left ventricle: The cavity size was moderately to severely   dilated. Wall thickness was normal. Systolic function was   severely reduced. The estimated ejection fraction was in the   range of 25% to 30%. Diffuse hypokinesis. There is akinesis of   the basalanteroseptal and anterior myocardium. Doppler parameters   are consistent with abnormal left ventricular relaxation (grade 1   diastolic dysfunction). - Mitral valve: Calcified annulus. Mildly calcified leaflets .   There was mild regurgitation. - Right atrium: Central venous pressure (est): 3 mm Hg. - Atrial septum: No defect or patent foramen ovale was identified. - Tricuspid valve: There was trivial regurgitation. - Pulmonary arteries: PA peak pressure: 27 mm Hg (S). - Pericardium, extracardiac: A prominent pericardial fat pad was   present.  Impressions:  - Moderate to severe LV chamber dilatation with LVEF approximately   25-30%. There is diffuse hypokinesis with near akinesis of the   basal anteroseptal and anterior myocardium. Compared to the   previous study from June 2016 there has been some improvement in   LVEF. Grade 1 diastolic dysfunction. MAC with mild mitral   regurgitation. Trivial tricuspid regurgitation with PASP 27 mmHg.  Transthoracic echocardiography.  M-mode, complete 2D, spectral Doppler, and color Doppler.  Birthdate:  Patient birthdate: July 23, 1941.  Age:   Patient is 80 yr old.  Sex:  Gender: female. BMI: 27.5 kg/m^2.  Blood pressure:     98/47  Patient status: Inpatient.  Study date:  Study date: 10/04/2015. Study time: 11:27 AM.  Location:  Echo laboratory    04/2021 echo IMPRESSIONS     1. Left ventricular ejection fraction, by estimation, is 25 to 30%. The  left ventricle has severely decreased function. The left ventricle  demonstrates global hypokinesis. The left ventricular internal cavity size  was moderately dilated. Left  ventricular diastolic parameters are consistent with Grade I diastolic  dysfunction (impaired relaxation). The average left ventricular global  longitudinal strain is -10.3 %. The global longitudinal strain is  abnormal.   2. Right ventricular systolic function is normal. The right ventricular  size is normal.   3. A small pericardial effusion is present. The pericardial effusion is  localized near the right atrium.   4. The mitral valve is normal in structure. Mild to moderate mitral valve  regurgitation. No evidence of mitral stenosis.   5. The aortic valve has an indeterminant number of cusps. Aortic valve  regurgitation is not visualized. No aortic stenosis is present.    Assessment and Plan  1. CAD - no recent symptoms, continue currnet meds     2. Chronic systolic HF - Medical therpay limited by soft bp's and renal insufficiency. Delene Loll was also expensive - wll see if she may be able to get farxiga 55m daily - repeat bmet/mg on diuretic.    3. Hyperlipidemia - statin changed previously due to cost, - continue pravastatin, request labs from pcp  4. CKD 3 - repeat bmet   F/u 4 months   JArnoldo Lenis M.D.

## 2021-08-02 LAB — BASIC METABOLIC PANEL
BUN/Creatinine Ratio: 16 (ref 12–28)
BUN: 26 mg/dL (ref 8–27)
CO2: 19 mmol/L — ABNORMAL LOW (ref 20–29)
Calcium: 9 mg/dL (ref 8.7–10.3)
Chloride: 96 mmol/L (ref 96–106)
Creatinine, Ser: 1.65 mg/dL — ABNORMAL HIGH (ref 0.57–1.00)
Glucose: 98 mg/dL (ref 70–99)
Potassium: 4.7 mmol/L (ref 3.5–5.2)
Sodium: 131 mmol/L — ABNORMAL LOW (ref 134–144)
eGFR: 32 mL/min/{1.73_m2} — ABNORMAL LOW (ref >59)

## 2021-08-02 LAB — MAGNESIUM: Magnesium: 1.4 mg/dL — ABNORMAL LOW (ref 1.6–2.3)

## 2021-08-09 ENCOUNTER — Other Ambulatory Visit: Payer: Self-pay

## 2021-08-09 ENCOUNTER — Ambulatory Visit (HOSPITAL_COMMUNITY)
Admission: RE | Admit: 2021-08-09 | Discharge: 2021-08-09 | Disposition: A | Discharge status: Home / Self Care | Payer: Medicare Other | Source: Ambulatory Visit | Attending: Family Medicine | Admitting: Family Medicine

## 2021-08-09 ENCOUNTER — Other Ambulatory Visit (HOSPITAL_COMMUNITY): Payer: Self-pay | Admitting: Adult Health Nurse Practitioner

## 2021-08-09 DIAGNOSIS — Z1231 Encounter for screening mammogram for malignant neoplasm of breast: Secondary | ICD-10-CM | POA: Diagnosis not present

## 2021-08-22 ENCOUNTER — Telehealth: Payer: Self-pay | Admitting: *Deleted

## 2021-08-22 MED ORDER — MAGNESIUM OXIDE 400 MG PO CAPS: ORAL_CAPSULE | ORAL | Status: DC

## 2021-08-22 NOTE — Telephone Encounter (Signed)
Nicole Chris, LPN  09/40/7680  3:52 PM EDT Back to Top    Notified, copy to pcp.    Antoine Poche, MD  08/13/2021  7:56 AM EDT     Had meant to add magnesium just a little low, can she take magnesium oxide 400mg  bid x 5 days, then 400mg  daily   MD   , MD  08/13/2021  7:49 AM EDT     Labs look good, renal function remains decreased but improved from prior check   Antoine Poche MD

## 2021-08-26 ENCOUNTER — Ambulatory Visit (INDEPENDENT_AMBULATORY_CARE_PROVIDER_SITE_OTHER): Payer: Medicare Other

## 2021-08-26 DIAGNOSIS — Z95810 Presence of automatic (implantable) cardiac defibrillator: Secondary | ICD-10-CM

## 2021-08-26 DIAGNOSIS — I5022 Chronic systolic (congestive) heart failure: Secondary | ICD-10-CM

## 2021-08-27 NOTE — Progress Notes (Signed)
EPIC Encounter for ICM Monitoring  Patient Name: Nicole Clements is a 80 y.o. female Date: 08/27/2021 Primary Care Physican: Lupita Raider, MD Primary Cardiologist: Wyline Mood Electrophysiologist: Graciela Husbands Nephrologist: Celene Skeen Pacing: 91.5%    08/27/2021 Weight: 145 lbs   Time in AT/AF  0.0 hr/day (0.0%)   Spoke with patient and heart failure questions reviewed.  Pt asymptomatic for fluid accumulation and feeling well.   Optivol thoracic impedance suggesting normal fluid levels.   Prescribed:   Furosemide 20 mg Take 20 mg daily. You may take an extra 20 mg daily as needed for swelling. Potassium 20 mEq take 1 tablet daily Spironolactone 25 mg take 0.5 tablet (12.5 mg total) twice a day   Lab 10/05/2020 Creatinine 1.56, BUN 24, Potassium 5.4, Sodium 124 GFR 32-39   Recommendations:  No changes and encouraged to call if experiencing any fluid symptoms.   Follow-up plan: ICM clinic phone appointment on 10/01/2021.  91 day device clinic remote scheduled 09/30/2021.     EP/Cardiology Office Visits:  12/02/2021 with Dr Wyline Mood.     Copy of ICM check sent to Dr. Graciela Husbands.    3 month ICM trend: 08/26/2021.    1 Year ICM trend:       Karie Soda, RN 08/27/2021 3:54 PM

## 2021-08-30 DIAGNOSIS — Z23 Encounter for immunization: Secondary | ICD-10-CM | POA: Diagnosis not present

## 2021-08-31 DIAGNOSIS — Z23 Encounter for immunization: Secondary | ICD-10-CM | POA: Diagnosis not present

## 2021-09-06 ENCOUNTER — Other Ambulatory Visit: Payer: Self-pay | Admitting: Cardiology

## 2021-09-30 ENCOUNTER — Ambulatory Visit (INDEPENDENT_AMBULATORY_CARE_PROVIDER_SITE_OTHER): Payer: Medicare Other

## 2021-09-30 DIAGNOSIS — I255 Ischemic cardiomyopathy: Secondary | ICD-10-CM | POA: Diagnosis not present

## 2021-09-30 LAB — CUP PACEART REMOTE DEVICE CHECK
Battery Remaining Longevity: 11 mo
Battery Voltage: 2.87 V
Brady Statistic AP VP Percent: 98.34 %
Brady Statistic AP VS Percent: 1.28 %
Brady Statistic AS VP Percent: 0.37 %
Brady Statistic AS VS Percent: 0.01 %
Brady Statistic RA Percent Paced: 93.87 %
Brady Statistic RV Percent Paced: 86.32 %
Date Time Interrogation Session: 20221128043723
HighPow Impedance: 71 Ohm
Implantable Lead Implant Date: 20160615
Implantable Lead Implant Date: 20160615
Implantable Lead Implant Date: 20160615
Implantable Lead Location: 753858
Implantable Lead Location: 753859
Implantable Lead Location: 753860
Implantable Lead Model: 4398
Implantable Lead Model: 5076
Implantable Pulse Generator Implant Date: 20160615
Lead Channel Impedance Value: 1121 Ohm
Lead Channel Impedance Value: 1121 Ohm
Lead Channel Impedance Value: 1197 Ohm
Lead Channel Impedance Value: 399 Ohm
Lead Channel Impedance Value: 399 Ohm
Lead Channel Impedance Value: 456 Ohm
Lead Channel Impedance Value: 475 Ohm
Lead Channel Impedance Value: 513 Ohm
Lead Channel Impedance Value: 551 Ohm
Lead Channel Impedance Value: 551 Ohm
Lead Channel Impedance Value: 703 Ohm
Lead Channel Impedance Value: 760 Ohm
Lead Channel Impedance Value: 779 Ohm
Lead Channel Pacing Threshold Amplitude: 0.625 V
Lead Channel Pacing Threshold Amplitude: 0.625 V
Lead Channel Pacing Threshold Amplitude: 1.5 V
Lead Channel Pacing Threshold Pulse Width: 0.4 ms
Lead Channel Pacing Threshold Pulse Width: 0.4 ms
Lead Channel Pacing Threshold Pulse Width: 0.4 ms
Lead Channel Sensing Intrinsic Amplitude: 1.375 mV
Lead Channel Sensing Intrinsic Amplitude: 1.375 mV
Lead Channel Sensing Intrinsic Amplitude: 12.375 mV
Lead Channel Sensing Intrinsic Amplitude: 12.375 mV
Lead Channel Setting Pacing Amplitude: 1.5 V
Lead Channel Setting Pacing Amplitude: 2 V
Lead Channel Setting Pacing Amplitude: 2.5 V
Lead Channel Setting Pacing Pulse Width: 0.4 ms
Lead Channel Setting Pacing Pulse Width: 0.4 ms
Lead Channel Setting Sensing Sensitivity: 0.45 mV

## 2021-10-01 ENCOUNTER — Ambulatory Visit (INDEPENDENT_AMBULATORY_CARE_PROVIDER_SITE_OTHER): Payer: Medicare Other

## 2021-10-01 DIAGNOSIS — I5022 Chronic systolic (congestive) heart failure: Secondary | ICD-10-CM

## 2021-10-01 DIAGNOSIS — Z95810 Presence of automatic (implantable) cardiac defibrillator: Secondary | ICD-10-CM | POA: Diagnosis not present

## 2021-10-04 NOTE — Progress Notes (Signed)
EPIC Encounter for ICM Monitoring  Patient Name: Nicole Clements is a 80 y.o. female Date: 10/04/2021 Primary Care Physican: Lupita Raider, MD Primary Cardiologist: Wyline Mood Electrophysiologist: Graciela Husbands Nephrologist: Celene Skeen Pacing: 87.3%    08/27/2021 Weight: 145 lbs   Time in AT/AF  0.0 hr/day (0.0%)   Spoke with patient and heart failure questions reviewed.  Pt asymptomatic for fluid accumulation and feeling well.   Optivol thoracic impedance suggesting normal fluid levels.   Prescribed:   Furosemide 20 mg Take 20 mg daily. You may take an extra 20 mg daily as needed for swelling. Potassium 20 mEq take 1 tablet daily Spironolactone 25 mg take 0.5 tablet (12.5 mg total) twice a day   Labs: 08/01/2021 Creatinine 1.65, BUN 26, Potassium 4.7, Sodium 131, GFR 32 A complete set of results can be found in Results Review.   Recommendations:  No changes and encouraged to call if experiencing any fluid symptoms.   Follow-up plan: ICM clinic phone appointment on 11/11/2021.  91 day device clinic remote scheduled 12/30/2021.     EP/Cardiology Office Visits:  12/02/2021 with Dr Wyline Mood.     Copy of ICM check sent to Dr. Graciela Husbands.    3 month ICM trend: 10/01/2021.    12-14 Month ICM trend:       Karie Soda, RN 10/04/2021 12:40 PM

## 2021-10-08 NOTE — Progress Notes (Signed)
Remote ICD transmission.   

## 2021-10-15 DIAGNOSIS — I25119 Atherosclerotic heart disease of native coronary artery with unspecified angina pectoris: Secondary | ICD-10-CM | POA: Diagnosis not present

## 2021-10-15 DIAGNOSIS — I129 Hypertensive chronic kidney disease with stage 1 through stage 4 chronic kidney disease, or unspecified chronic kidney disease: Secondary | ICD-10-CM | POA: Diagnosis not present

## 2021-10-15 DIAGNOSIS — E063 Autoimmune thyroiditis: Secondary | ICD-10-CM | POA: Diagnosis not present

## 2021-10-15 DIAGNOSIS — Z720 Tobacco use: Secondary | ICD-10-CM | POA: Diagnosis not present

## 2021-10-15 DIAGNOSIS — Z950 Presence of cardiac pacemaker: Secondary | ICD-10-CM | POA: Diagnosis not present

## 2021-10-15 DIAGNOSIS — I340 Nonrheumatic mitral (valve) insufficiency: Secondary | ICD-10-CM | POA: Diagnosis not present

## 2021-10-15 DIAGNOSIS — E782 Mixed hyperlipidemia: Secondary | ICD-10-CM | POA: Diagnosis not present

## 2021-10-15 DIAGNOSIS — N183 Chronic kidney disease, stage 3 unspecified: Secondary | ICD-10-CM | POA: Diagnosis not present

## 2021-10-15 DIAGNOSIS — I5022 Chronic systolic (congestive) heart failure: Secondary | ICD-10-CM | POA: Diagnosis not present

## 2021-10-15 DIAGNOSIS — Z0000 Encounter for general adult medical examination without abnormal findings: Secondary | ICD-10-CM | POA: Diagnosis not present

## 2021-10-15 DIAGNOSIS — L989 Disorder of the skin and subcutaneous tissue, unspecified: Secondary | ICD-10-CM | POA: Diagnosis not present

## 2021-10-23 DIAGNOSIS — N179 Acute kidney failure, unspecified: Secondary | ICD-10-CM | POA: Diagnosis not present

## 2021-11-11 ENCOUNTER — Ambulatory Visit (INDEPENDENT_AMBULATORY_CARE_PROVIDER_SITE_OTHER): Payer: Medicare Other

## 2021-11-11 DIAGNOSIS — Z95810 Presence of automatic (implantable) cardiac defibrillator: Secondary | ICD-10-CM | POA: Diagnosis not present

## 2021-11-11 DIAGNOSIS — I5022 Chronic systolic (congestive) heart failure: Secondary | ICD-10-CM

## 2021-11-11 NOTE — Progress Notes (Signed)
EPIC Encounter for ICM Monitoring  Patient Name: Nicole Clements is a 81 y.o. female Date: 11/11/2021 Primary Care Physican: Lupita Raider, MD Primary Cardiologist: Wyline Mood Electrophysiologist: Joycelyn Schmid Pacing: 87.4%    11/08/2021 Weight: 144 lbs   Since 11-Oct-2021 AT/AF 1 Time in AT/AF <0.1 hr/day (<0.1%) Longest AT/AF 2 minutes   Spoke with patient and heart failure questions reviewed.    Pt has swelling in legs/feet since being instructed to hold Furosemide, Potassium and Losartan by PCP at 10/15/2021 OV due to decreased kidney function.  All meds were resumed 11/04/2021.    Advised for future to contact Dr Verna Czech office regarding any changes in heart meds.    Optivol thoracic impedance suggesting possible fluid accumulation since 10/14/2021 which correlates with Furosemide being held x 3 weeks.   Prescribed:   Furosemide 20 mg Take 20 mg daily. You may take an extra 20 mg daily as needed for swelling.   Potassium 20 mEq take 1 tablet daily Spironolactone 25 mg take 0.5 tablet (12.5 mg total) twice a day   Labs:  Repeat labs at PCP office 11/19/2021 10/23/2021 Creatinine 1.40, BUN 10, Potassium 4.3, Sodium 134, GFR 39  Care Everywhere office note 10/15/2021 Creatinine 2.46, BUN 43, Potassium 5.0, Sodium 127, GFR 20  Care Everywhere office note 08/01/2021 Creatinine 1.65, BUN 26, Potassium 4.7, Sodium 131, GFR 32 A complete set of results can be found in Results Review.   Recommendations:   Advised to take Furosemide 20 mg 2 tablets (40 mg total) x 2 days only.  After 2 days return to 1 tablet daily.  She repeated instructions back correctly.  Will repeat fluid level check 1/13.     Follow-up plan: ICM clinic phone appointment on 11/15/2021 (manual) to recheck fluid levels.  91 day device clinic remote scheduled 12/30/2021.     EP/Cardiology Office Visits:  12/02/2021 with Dr Wyline Mood.     Copy of ICM check sent to Dr. Graciela Husbands. Copy sent to Dr Wyline Mood for review and further  recommendations if needed.   3 month ICM trend: 11/11/2021.    12-14 Month ICM trend:     Karie Soda, RN 11/11/2021 4:02 PM

## 2021-11-15 ENCOUNTER — Ambulatory Visit (INDEPENDENT_AMBULATORY_CARE_PROVIDER_SITE_OTHER): Payer: Medicare Other

## 2021-11-15 DIAGNOSIS — I5022 Chronic systolic (congestive) heart failure: Secondary | ICD-10-CM

## 2021-11-15 DIAGNOSIS — Z95810 Presence of automatic (implantable) cardiac defibrillator: Secondary | ICD-10-CM

## 2021-11-15 NOTE — Progress Notes (Signed)
EPIC Encounter for ICM Monitoring  Patient Name: Nicole Clements is a 81 y.o. female Date: 11/15/2021 Primary Care Physican: Lupita Raider, MD Primary Cardiologist: Wyline Mood Electrophysiologist: Joycelyn Schmid Pacing: 89.1%    11/08/2021 Weight: 144 lbs   Since 11-Nov-2021 Time in AT/AF  <0.1 hr/day (<0.1%)   Spoke with patient and heart failure questions reviewed.  Pt reports swelling of feet have resolved after taking 2 days of extra Furosemide.  Furosemide was held x 3 weeks by PCP.    Optivol thoracic impedance suggesting fluid levels improving after 2 days of extra Furosemide but possible fluid accumulation continues since 10/14/2021.  Furosemide was held x 3 weeks.   Prescribed:   Furosemide 20 mg Take 20 mg daily. You may take an extra 20 mg daily as needed for swelling.   Potassium 20 mEq take 1 tablet daily Spironolactone 25 mg take 0.5 tablet (12.5 mg total) twice a day   Labs:  Repeat labs at PCP office 11/19/2021 10/23/2021 Creatinine 1.40, BUN 10, Potassium 4.3, Sodium 134, GFR 39  Care Everywhere office note 10/15/2021 Creatinine 2.46, BUN 43, Potassium 5.0, Sodium 127, GFR 20  Care Everywhere office note 08/01/2021 Creatinine 1.65, BUN 26, Potassium 4.7, Sodium 131, GFR 32 A complete set of results can be found in Results Review.   Recommendations:   Advised to take Furosemide 20 mg 2 tablets (40 mg total) 1 more today (already taken extra x 2 days) to = 3rd day of extra.  After today, return to 1 tablet daily.  Will repeat fluid level check 1/16.     Follow-up plan: ICM clinic phone appointment on 11/18/2021 (manual) to recheck fluid levels.  91 day device clinic remote scheduled 12/30/2021.     EP/Cardiology Office Visits:  12/02/2021 with Dr Wyline Mood.     Copy of ICM check sent to Dr. Graciela Husbands and Dr Wyline Mood for review and any recommendations.    3 month ICM trend: 11/15/2021.    12-14 Month ICM trend:     Karie Soda, RN 11/15/2021 10:02 AM

## 2021-11-18 ENCOUNTER — Ambulatory Visit (INDEPENDENT_AMBULATORY_CARE_PROVIDER_SITE_OTHER): Payer: Medicare Other

## 2021-11-18 DIAGNOSIS — I5022 Chronic systolic (congestive) heart failure: Secondary | ICD-10-CM

## 2021-11-18 DIAGNOSIS — Z95810 Presence of automatic (implantable) cardiac defibrillator: Secondary | ICD-10-CM

## 2021-11-18 NOTE — Progress Notes (Signed)
Received: Today Branch, Dorothe Pea, MD  Idania Desouza, Josephine Igo, RN I agree with your assessment and plan   Dominga Ferry MD

## 2021-11-19 DIAGNOSIS — N179 Acute kidney failure, unspecified: Secondary | ICD-10-CM | POA: Diagnosis not present

## 2021-11-19 NOTE — Progress Notes (Signed)
EPIC Encounter for ICM Monitoring  Patient Name: Nicole Clements is a 81 y.o. female Date: 11/19/2021 Primary Care Physican: Mayra Neer, MD Primary Cardiologist: Harl Bowie Electrophysiologist: Vergie Living Pacing: 92.9%    11/08/2021 Weight: 144 lbs 11/19/2021 Weight:  144 lbs   Since 11-Nov-2021 Time in AT/AF  <0.1 hr/day (<0.1%)   Spoke with patient and heart failure questions reviewed.  Pt reports she is feeling okay and does not have any fluid symptoms.    Optivol thoracic impedance suggesting fluid levels have returned close to baseline after taking 3 days of extra 20 mg Furosemide x 3 days.   Prescribed:   Furosemide 20 mg Take 20 mg daily. You may take an extra 20 mg daily as needed for swelling.   Potassium 20 mEq take 1 tablet daily Spironolactone 25 mg take 0.5 tablet (12.5 mg total) twice a day   Labs: Scheduled for labs 1/17 at PCP office 10/23/2021 Creatinine 1.40, BUN 10, Potassium 4.3, Sodium 134, GFR 39  Care Everywhere office note 10/15/2021 Creatinine 2.46, BUN 43, Potassium 5.0, Sodium 127, GFR 20  Care Everywhere office note 08/01/2021 Creatinine 1.65, BUN 26, Potassium 4.7, Sodium 131, GFR 32 A complete set of results can be found in Results Review.   Recommendations:   Advised to limit salt and fluid intake.  Will recheck fluid levels 1/25.     Follow-up plan: ICM clinic phone appointment on 11/27/2021.  91 day device clinic remote scheduled 12/30/2021.     EP/Cardiology Office Visits:  12/02/2021 with Dr Harl Bowie.     Copy of ICM check sent to Dr. Caryl Comes and Dr Harl Bowie for [facility] showing fluid levels have returned close to baseline.    3 month ICM trend: 1/162023.    12-14 Month ICM trend:     Rosalene Billings, RN 11/19/2021 1:55 PM

## 2021-11-27 ENCOUNTER — Ambulatory Visit (INDEPENDENT_AMBULATORY_CARE_PROVIDER_SITE_OTHER): Payer: Medicare Other

## 2021-11-27 DIAGNOSIS — Z95810 Presence of automatic (implantable) cardiac defibrillator: Secondary | ICD-10-CM

## 2021-11-27 DIAGNOSIS — I5022 Chronic systolic (congestive) heart failure: Secondary | ICD-10-CM

## 2021-11-27 NOTE — Progress Notes (Signed)
EPIC Encounter for ICM Monitoring  Patient Name: Nicole Clements is a 81 y.o. female Date: 11/27/2021 Primary Care Physican: Mayra Neer, MD Primary Cardiologist: Harl Bowie Electrophysiologist: Vergie Living Pacing: 92.9%    11/08/2021 Weight: 144 lbs 11/19/2021 Weight:  144 lbs   Since 11-Nov-2021 Time in AT/AF  <0.1 hr/day (<0.1%)   Spoke with patient and heart failure questions reviewed.  Pt continues to feel well and does not have any fluid symptoms.  Impedance has not returned to normal after Furosemide was held from 12/13-1/2 by PCP.   Optivol thoracic impedance suggesting fluid levels has not return to normal after taking extra Furosemide x 3 days (week of 1/9) and suggesting ongoing fluid accumulation starting 10/14/21.    Prescribed:   Furosemide 20 mg Take 20 mg daily. You may take an extra 20 mg daily as needed for swelling.   Potassium 20 mEq take 1 tablet daily Spironolactone 25 mg take 0.5 tablet (12.5 mg total) twice a day   Labs: Scheduled for labs 1/17 at PCP office 10/23/2021 Creatinine 1.40, BUN 10, Potassium 4.3, Sodium 134, GFR 39  Care Everywhere office note 10/15/2021 Creatinine 2.46, BUN 43, Potassium 5.0, Sodium 127, GFR 20  Care Everywhere office note 08/01/2021 Creatinine 1.65, BUN 26, Potassium 4.7, Sodium 131, GFR 32 A complete set of results can be found in Results Review.   Recommendations:  No changes and encouraged to call if experiencing any fluid symptoms.   Follow-up plan: ICM clinic phone appointment on 12/09/2021 to recheck fluid levels.  91 day device clinic remote scheduled 12/30/2021.     EP/Cardiology Office Visits:  12/02/2021 with Dr Harl Bowie.     Copy of ICM check sent to Dr. Caryl Comes and Dr Harl Bowie for [facility] for review and upcoming appointment on 1/30.    3 month ICM trend: 11/27/2021.    12-14 Month ICM trend:     Rosalene Billings, RN 11/27/2021 7:38 AM

## 2021-12-02 ENCOUNTER — Ambulatory Visit (INDEPENDENT_AMBULATORY_CARE_PROVIDER_SITE_OTHER): Payer: Medicare Other | Admitting: Cardiology

## 2021-12-02 ENCOUNTER — Encounter: Payer: Self-pay | Admitting: *Deleted

## 2021-12-02 ENCOUNTER — Encounter: Payer: Self-pay | Admitting: Cardiology

## 2021-12-02 VITALS — BP 118/60 | HR 80 | Ht 62.0 in | Wt 139.0 lb

## 2021-12-02 DIAGNOSIS — E782 Mixed hyperlipidemia: Secondary | ICD-10-CM | POA: Diagnosis not present

## 2021-12-02 DIAGNOSIS — I5022 Chronic systolic (congestive) heart failure: Secondary | ICD-10-CM | POA: Diagnosis not present

## 2021-12-02 DIAGNOSIS — I2510 Atherosclerotic heart disease of native coronary artery without angina pectoris: Secondary | ICD-10-CM | POA: Diagnosis not present

## 2021-12-02 NOTE — Progress Notes (Signed)
Clinical Summary Ms. Estelle is a 81 y.o.female seen today for follow up of the following medical problems.    1. 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 Admit 07/2014 with CHF and troponin elevation - cath 07/2014, LM 30% distal, LAD mid 50%, LCX ostial 50% and mid 50%, small intermediate Eugen Jeansonne 70%. RCA diffuse 50%, distal RCA with ulcerated 90% lesion, PDA 80-90% too small for PCI. LVEF 30%. Received DES to distal RCA, post procedure the PDA became occluded.   - echo 07/2014 LVEF 20%, inferior akinesis, grade II diastolic dysfunction. Repeat echo 10/2014 LVEF remains 15-20%.   - 04/2015 CRT-D device placed by Dr Caryl Comes. Normal function Jan 2018.  10/2015 echo LVEF 25-30%  - could not afford entresto, back on losartan.     04/2021 echo LVEF 25-30% - no recent edema, no SOB/DOE - was to start [place], does not appear she got from pharmacy - 06/2021 normal device check.    - tried to start [place]. Took free 30 days, insurance was then $600 - compliant with meds - no SOB/DOE, mild edema at times - today weight 139 lbs, down from 142 lbs 07/2021     2. Hyperlipidemia  - atorva was too expensive, changed to pravastatin by pcp      10/2020 TC 104 TG 73 HDL 40 LDL 64   3. CKD III - followed by pcp     Past Medical History:  Diagnosis Date   AICD (automatic cardioverter/defibrillator) present    CAD (coronary artery disease) April 2014   DES to PLA, occluded PDA 07/2014   CHF (congestive heart failure) (HCC)    Hypercholesterolemia    Hypertension    Ischemic cardiomyopathy    LVEF 25%-45%   IVCD (intraventricular conduction defect)    PVC's (premature ventricular contractions)    S/P colonoscopy August 2007   Hyperplastic polyps, rare sigmoid and descending colon diverticulosis, small internal hemorrhoids   STEMI (ST elevation myocardial infarction) ([place]) 07/22/1996     Allergies  Allergen Reactions   Lactose Intolerance (Gi) Other (See Comments)    GI  Upset    Sulfa Antibiotics Rash     Current Outpatient Medications  Medication Sig Dispense Refill   acetaminophen (TYLENOL) 500 MG tablet Take 1,000 mg by mouth every 8 (eight) hours as needed for mild pain or headache.     aspirin EC 81 MG tablet Take 1 tablet (81 mg total) by mouth daily. 90 tablet 3   carvedilol (COREG) 25 MG tablet TAKE 1 TABLET BY MOUTH TWICE DAILY 60 tablet 6   Cholecalciferol (VITAMIN D3) 2000 UNITS capsule Take 2,000 Units by mouth daily.     FARXIGA 10 MG TABS tablet TAKE 1 TABLET(10 MG) BY MOUTH DAILY BEFORE AND BREAKFAST 30 tablet 6   furosemide (LASIX) 20 MG tablet TAKE 1 TABLET EVERY DAY YOU MAY TAKE AN EXTRA 20 MG EVERY DAY AS NEEDED FOR SWELLING 180 tablet 3   losartan (COZAAR) 25 MG tablet TAKE 1 TABLET(25 MG) BY MOUTH DAILY 90 tablet 3   Magnesium Oxide 400 MG CAPS Take 464m by mouth twice a day x 5 days, then 4034mby mouth daily thereafter.     nitroGLYCERIN (NITROSTAT) 0.4 MG SL tablet Place 1 tablet (0.4 mg total) under the tongue every 5 (five) minutes x 3 doses as needed for chest pain. 25 tablet 2   Omega-3 Fatty Acids (FISH OIL) 1200 MG CAPS Take 1,200 mg by mouth daily.  potassium chloride SA (KLOR-CON) 20 MEQ tablet TAKE 1 TABLET(20 MEQ) BY MOUTH DAILY 90 tablet 2   pravastatin (PRAVACHOL) 80 MG tablet Take 80 mg by mouth daily.     spironolactone (ALDACTONE) 25 MG tablet TAKE 1/2 TABLET(12.5 MG) BY MOUTH TWICE DAILY 90 tablet 3   No current facility-administered medications for this visit.     Past Surgical History:  Procedure Laterality Date   ABDOMINAL HYSTERECTOMY     CARDIAC CATHETERIZATION  April 2014   Med Rx   CATARACT EXTRACTION W/PHACO Right 09/15/2017   Procedure: CATARACT EXTRACTION PHACO AND INTRAOCULAR LENS PLACEMENT ([place]);  Surgeon: Rutherford Guys, MD;  Location: [facility];  Service: Ophthalmology;  Laterality: Right;  CDE: 12.22   CATARACT EXTRACTION W/PHACO Left 09/29/2017   Procedure: CATARACT EXTRACTION PHACO AND  INTRAOCULAR LENS PLACEMENT ([facility]);  Surgeon: Rutherford Guys, MD;  Location: [facility];  Service: Ophthalmology;  Laterality: Left;  CDE: 10.02   COLONOSCOPY N/A 12/24/2015   Dr. Fields:moderate sized internal hemorrhoids/moderate diverticulosis, negative microscopic colitis    EP IMPLANTABLE DEVICE  04/18/2015   BV ICD   EP IMPLANTABLE DEVICE N/A 04/18/2015   Procedure: BiV ICD Insertion CRT-D;  Surgeon: Deboraha Sprang, MD;  Location: [facility];  Service: Cardiovascular;  Laterality: N/A;   LEFT AND RIGHT HEART CATHETERIZATION WITH CORONARY ANGIOGRAM N/A 07/17/2014   Procedure: LEFT AND RIGHT HEART CATHETERIZATION WITH CORONARY ANGIOGRAM;  Surgeon: Blane Ohara, MD;  Location: [facility] CATH LAB;  Service: Cardiovascular;  Laterality: N/A;   PERCUTANEOUS CORONARY STENT INTERVENTION (PCI-S)  07/17/2014   Procedure: PERCUTANEOUS CORONARY STENT INTERVENTION (PCI-S);  Surgeon: Blane Ohara, MD;  Location[facility] CATH LAB;  Service: Cardiovascular;;  Distal RCA   S/P Hysterectomy       Allergies  Allergen Reactions   Lactose Intolerance (Gi) Other (See Comments)    GI Upset    Sulfa Antibiotics Rash      Family History  Problem Relation Age of Onset   Hypertension Sister    Diabetes Mellitus II Sister    Hypertension Brother    Diabetes Mellitus II Brother    Hypertension Brother    Diabetes Mellitus II Brother    Hypertension Brother    Diabetes Mellitus II Brother    Colon cancer Neg Hx      Social History Ms. Gharibian reports that she has been smoking cigarettes. She started smoking about 59 years ago. She has a 25.00 pack-year smoking history. She has never used smokeless tobacco. Ms. Boehning reports no history of alcohol use.   Review of Systems CONSTITUTIONAL: No weight loss, fever, chills, weakness or fatigue.  HEENT: Eyes: No visual loss, blurred vision, double vision or yellow sclerae.No hearing loss, sneezing, congestion, runny nose or sore throat.  SKIN: No rash or  itching.  CARDIOVASCULAR: per hpi RESPIRATORY: No shortness of breath, cough or sputum.  GASTROINTESTINAL: No anorexia, nausea, vomiting or diarrhea. No abdominal pain or blood.  GENITOURINARY: No burning on urination, no polyuria NEUROLOGICAL: No headache, dizziness, syncope, paralysis, ataxia, numbness or tingling in the extremities. No change in bowel or bladder control.  MUSCULOSKELETAL: No muscle, back pain, joint pain or stiffness.  LYMPHATICS: No enlarged nodes. No history of splenectomy.  PSYCHIATRIC: No history of depression or anxiety.  ENDOCRINOLOGIC: No reports of sweating, cold or heat intolerance. No polyuria or polydipsia.  [name]   Physical Examination Today's Vitals   12/02/21 1136  BP: 118/60  Pulse: 80  SpO2: 98%  Weight: 139 lb (  63 kg)  Height: _0  (1.575 m)   Body mass index is 25.42 kg/m.  Gen: resting comfortably, no acute distress HEENT: no scleral icterus, pupils equal round and reactive, no palptable cervical adenopathy,  CV: RRR, no m/r/g no jvd Resp: Clear to auscultation bilaterally GI: abdomen is soft, non-tender, non-distended, normal bowel sounds, no hepatosplenomegaly MSK: extremities are warm, no edema.  Skin: warm, no rash Neuro:  no focal deficits Psych: appropriate affect   Diagnostic Studies 02/2013 Cath   Procedural Findings:   Hemodynamics:   AO 116/45 with a mean of 70   LV 120/18   Coronary angiography:   Coronary dominance: right   Left mainstem: The left main is patent with 30% distal left main stenosis as the vessel trifurcates into the LAD, intermediate Yunus Stoklosa, and left circumflex.   Left anterior descending (LAD): The LAD is patent with diffuse disease noted. There is minimal calcification present. The first diagonal is patent with 30-50% diffuse disease. The LAD after the first septal perforator has 40-50% stenosis. The vessel reaches the apex and wraps around the left ventricular apex without significant stenosis.   Left  circumflex (LCx): The intermediate Padme Arriaga is small in caliber and diffusely diseased with 50-60% proximal vessel stenosis the AV groove circumflex is diffusely diseased with 50% ostial stenosis and 30% mid stenosis it supplies 2 small posterolateral Doranne Schmutz   Right coronary artery (RCA): The RCA is diffusely diseased. There is significant tortuosity, especially around the junction of the mid and distal vessel. The vessel is diffusely calcified. The mid vessel has tandem 50% stenoses. The distal vessel has 80-90 % stenosis just before the bifurcation of the PDA and posterior AV segment. The PDA and posterior AV segment with 3 posterolateral branches are patent.   Left ventriculography: Left ventricular systolic function is moderately depressed. There is severe hypokinesis of the basal and midinferior walls. The anterolateral and apical walls contract normally. The left ventricular ejection fraction is estimated at 45%.   Abdominal aortogram: The abdominal aorta is patent. The renal arteries are single bilaterally and they are widely patent. There appears to be nonobstructive stenosis of the right common iliac artery at the aortoiliac junction. The left common iliac artery appears to have significant ostial stenosis estimated at at least 75% angiographically. The visualized portions of the external iliac and common femoral arteries are patent   Final Conclusions:  1. Severe single-vessel coronary artery disease involving the distal right coronary artery   2. Moderate LV dysfunction with segmental severe hypokinesis of the inferior wall and normal contraction of the antero-apex with estimated left ventricular ejection fraction of 45%.   3. Severe left common iliac artery stenosis   Recommendations: The patient has severe distal RCA disease. She has a known history of inferior wall infarction and remote PCA. This fits with that clinical history. She has nonobstructive disease of the left coronary artery. Her left  ventricular ejection fraction by ventriculography is clearly greater than 35%. With her history of previous MI, I do not think PCI is indicated with an absence of angina. Will carefully discussed symptoms with the patient, but I am inclined to treat her medically. Will also review indication for consideration of peripheral intervention for her iliac disease.       01/2013 Echo   Study Conclusions  - Left ventricle: The cavity size was mildly to moderately dilated. Wall thickness was normal. Systolic function was severely reduced. The estimated ejection fraction was 25%. Severe diffuse hypokinesis. - Aortic valve:  Mildly calcified annulus. Trileaflet. - Mitral valve: Calcified annulus. - Left atrium: The atrium was moderately dilated. - Right ventricle: The cavity size was normal. Wall thickness was mildly to moderately increased. - Right atrium: The atrium was mildly dilated. - Atrial septum: No defect or patent foramen ovale was identified. Impressions:  - Compared to the prior study of 03/13/11, there has been substantial deterioration in LV systolic function.   07/2014 Echo Study Conclusions  - Left ventricle: The cavity size was severely dilated. Wall thickness was normal. The estimated ejection fraction was 20%. Diffuse hypokinesis. There is akinesis of the inferolateral and inferior myocardium. Features are consistent with a pseudonormal left ventricular filling pattern, with concomitant abnormal relaxation and increased filling pressure (grade 2 diastolic dysfunction). Doppler parameters are consistent with high ventricular filling pressure. - Mitral valve: There was mild to moderate regurgitation. - Left atrium: The atrium was moderately dilated.  Impressions:  - Global hypokinesis; inferior and inferolateral akinesis; overall severely reduced LV function; grade 2 diastolic dysfunction with elevated left ventricular filling pressures, moderate LAE; mild to moderate MR.   07/2014 Cath PROCEDURAL FINDINGS   Hemodynamics:   AO 122/48   LV 118/14   RA 9   RV 41/11   PA 37/5 with a mean of 21   PCWP 27   Oxygen saturations   AO 90   PA 56   Cardiac output 3.5   Cardiac index 2.0   Coronary angiography:   Coronary dominance: right   Left mainstem: The left mainstem is patent with 30% distal left main stenosis.   Left anterior descending (LAD): The LAD is diffusely diseased in the midportion. There are sequential 50% stenoses, unchanged from previous study. The first diagonal Haroldine Redler is large in distribution with diffuse nonobstructive disease noted. The mid and distal LAD are patent without significant disease.   Left circumflex (LCx): The left circumflex has 50% ostial stenosis, 50% mid vessel stenosis, and to OM branches with no significant disease. There is an intermediate Brandyn Lowrey is diffusely diseased with up to 70% stenosis. This vessel is small in caliber.   Right coronary artery (RCA): Dominant vessel. Tortuous with diffuse nonobstructive stenosis throughout the proximal and mid portions. There are diffuse 50% stenosis present. The distal vessel has an ulcerated-appearing irregular 90% stenosis involving the bifurcation of the PDA and posterolateral branches. The posterolateral branches are patent with mild diffuse disease. The PDA arises an acute angulation with 30-40% stenosis. The mid body of the PDA has an 80-90% stenosis in an area where the vessel is too small for PCI.   Left ventriculography: The ventricle appears the synchronous. The basal and midinferior wall are akinetic. The basal inferolateral wall is severely hypokinetic. The anterolateral wall and apex contract normally. The estimated LVEF is 30%.   PCI Procedure Note: Following the diagnostic procedure, the decision was made to proceed with PCI of the distal RCA. The lesion is complex, there is a large amount of myocardium involved. I felt there was a risk of potentially compromising the PDA  Ruxin Ransome, but overall in my opinion the risk/benefit was favorable for PCI considering the severe stenosis involving the entire RCA distribution. The sheath was upsized to a 6 [place]. Weight-based bivalirudin was given for anticoagulation. The patient was loaded with Plavix 600 mg on the table. Once a therapeutic ACT was achieved, a 6 [place] JR 4 guide catheter was inserted. A cougar coronary guidewire was used to cross the lesion into the PLA Aletheia Tangredi. The lesion was predilated  with a 2.5 x 15 mm balloon. The lesion was then stented with a 3.0 by 103m Promus DES stent. The stent was postdilated with a 3.25 mm noncompliant balloon to 18 atmospheres. The PDA was stented across as the stent extended from the distal RCA proximal PLA Leobardo Granlund. The PDA was totally occluded. I made a long attempt with a whisper wire using multiple bands on the tip of the wire, but all of these attempts were unsuccessful at rewiring the PDA. The patient was completely asymptomatic and specifically denied any chest pain. Her hemodynamics remained stable and there were no changes in her rhythm or blood pressure. A final image of the left coronary artery was obtained and this demonstrated a collateral from the LAD to the PDA Slyvester Latona. Following PCI, there was 0% residual stenosis and TIMI-3 flow. Final angiography confirmed an excellent result. Femoral hemostasis was achieved will be achieved with manual hemostasis. The patient tolerated the PCI procedure well. There were no immediate procedural complications. The patient was transferred to the post catheterization recovery area for further monitoring.   PCI Data:   Vessel - RCA/Segment - distal   Percent Stenosis (pre) 90   TIMI-flow 3   Stent 3.0 by 177mPromus DES   Percent Stenosis (post) 0   TIMI-flow (post) 3   Contrast: 175 cc Omnipaque   Radiation dose/Fluoro time: 15.5 minutes   Estimated Blood Loss: Minimal   Final Conclusions:  1. Nonobstructive disease involving the LAD, left  circumflex, and OM/diagonal side branches   2. Severe stenosis of the distal RCA bifurcation, treated successfully with a drug-eluting stent into the PLA Nathon Stefanski with residual occlusion of the PDA Eimi Viney   3. Severe segmental LV systolic dysfunction   Recommendations: Dual antiplatelet therapy with aspirin and Plavix for at least 12 months. Will cycle cardiac enzymes since the PDA Stelios Kirby is occluded after stenting.   10/2014 Echo Study Conclusions  - Procedure narrative: Transthoracic echocardiography. Image   quality was suboptimal. The study was technically difficult, as a   result of poor sound wave transmission and body habitus. - Left ventricle: Systolic function is severely reduced, estimated   EF 15-20%. The cavity size was severely dilated. Doppler   parameters are consistent with abnormal left ventricular   relaxation (grade 1 diastolic dysfunction). Doppler parameters   are consistent with high ventricular filling pressure. Medial   E/e&' 24. - Regional wall motion abnormality: Akinesis of the basal-mid   inferior and basal inferolateral myocardium; severe hypokinesis   of the apical inferior and mid inferolateral myocardium. Severe,   diffuse hypokinesis. - Ventricular septum: Septal motion showed abnormal function and   dyssynergy. These changes are consistent with intraventricular   conduction delay. - Aortic valve: Mildly calcified annulus. Trileaflet; mildly   thickened leaflets. - Mitral valve: Mildly dilated annulus. Mildly thickened leaflets .   There was moderate eccentric regurgitation. Likely functional due   to mitral annular dilatation. - Left atrium: The atrium was severely dilated. Volume/bsa, S: 47.1   ml/m^2. - Right ventricle: Systolic function was mildly reduced. - Right atrium: The atrium was mildly dilated. - Atrial septum: There was increased thickness of the septum,   consistent with lipomatous hypertrophy. - Tricuspid valve: There was mild  regurgitation. - Pulmonary arteries: PA peak pressure: 42 mm Hg (S). Mildly   elevated pulmonary pressures. - Pericardium, extracardiac: There was a small pericardial   effusion, with no evidence of tamponade physiology.   04/2015 Carotid [place]ION: 1. Mild (1-49%) stenosis  of the proximal right internal carotid artery secondary to smooth heterogeneous atherosclerotic plaque. No interval change compared to recent prior imaging from earlier this month. 2. Mild (1-49%) stenosis of the proximal left internal carotid artery secondary to heterogeneous and irregular atherosclerotic plaque. No interval change compared to recent prior imaging from earlier this month. 3. Vertebral arteries remain patent with normal antegrade flow. Signed,       10/2015 echo Study Conclusions  - Left ventricle: The cavity size was moderately to severely   dilated. Wall thickness was normal. Systolic function was   severely reduced. The estimated ejection fraction was in the   range of 25% to 30%. Diffuse hypokinesis. There is akinesis of   the basalanteroseptal and anterior myocardium. Doppler parameters   are consistent with abnormal left ventricular relaxation (grade 1   diastolic dysfunction). - Mitral valve: Calcified annulus. Mildly calcified leaflets .   There was mild regurgitation. - Right atrium: Central venous pressure (est): 3 mm Hg. - Atrial septum: No defect or patent foramen ovale was identified. - Tricuspid valve: There was trivial regurgitation. - Pulmonary arteries: PA peak pressure: 27 mm Hg (S). - Pericardium, extracardiac: A prominent pericardial fat pad was   present.  Impressions:  - Moderate to severe LV chamber dilatation with LVEF approximately   25-30%. There is diffuse hypokinesis with near akinesis of the   basal anteroseptal and anterior myocardium. Compared to the   previous study from June 2016 there has been some improvement in   LVEF. Grade 1 diastolic  dysfunction. MAC with mild mitral   regurgitation. Trivial tricuspid regurgitation with PASP 27 mmHg.  Transthoracic echocardiography.  M-mode, complete 2D, spectral Doppler, and color Doppler.  Birthdate:  Patient birthdate: 1941-07-31.  Age:  Patient is 81 yr old.  Sex:  Gender: female. BMI: 27.5 kg/m^2.  Blood pressure:     98/47  Patient status: Inpatient.  Study date:  Study date: 10/04/2015. Study time: 11:27 AM.  Location:  Echo laboratory     04/2021 echo IMPRESSIONS     1. Left ventricular ejection fraction, by estimation, is 25 to 30%. The  left ventricle has severely decreased function. The left ventricle  demonstrates global hypokinesis. The left ventricular internal cavity size  was moderately dilated. Left  ventricular diastolic parameters are consistent with Grade I diastolic  dysfunction (impaired relaxation). The average left ventricular global  longitudinal strain is -10.3 %. The global longitudinal strain is  abnormal.   2. Right ventricular systolic function is normal. The right ventricular  size is normal.   3. A small pericardial effusion is present. The pericardial effusion is  localized near the right atrium.   4. The mitral valve is normal in structure. Mild to moderate mitral valve  regurgitation. No evidence of mitral stenosis.   5. The aortic valve has an indeterminant number of cusps. Aortic valve  regurgitation is not visualized. No aortic stenosis is present.     Assessment and Plan  1. CAD - no symptoms, continue current meds     2. Chronic systolic HF - Medical therpay limited by soft bp's and renal insufficiency. Delene Loll was also expensive - will see if able to start farxiga 65m daily based on cost   3. Hyperlipidemia - statin changed previously due to cost, -continue current meds   4. CKD 3 - repeat bmet      JArnoldo Lenis M.D.

## 2021-12-02 NOTE — Patient Instructions (Addendum)
Medication Instructions:  Your physician recommends that you continue on your current medications as directed. Please refer to the Current Medication list given to you today.  Labwork: none  Testing/Procedures: none  Follow-Up: Your physician recommends that you schedule a follow-up appointment in: 4 months  Any Other Special Instructions Will Be Listed Below (If Applicable). Please fill out patient assistance paperwork given to you today for farxiga. You may drop it off at our [place] office if its closer for you.  If you need a refill on your cardiac medications before your next appointment, please call your pharmacy.

## 2021-12-05 ENCOUNTER — Other Ambulatory Visit: Payer: Self-pay | Admitting: Cardiology

## 2021-12-09 ENCOUNTER — Ambulatory Visit (INDEPENDENT_AMBULATORY_CARE_PROVIDER_SITE_OTHER): Payer: Medicare Other

## 2021-12-09 DIAGNOSIS — Z95810 Presence of automatic (implantable) cardiac defibrillator: Secondary | ICD-10-CM

## 2021-12-09 DIAGNOSIS — I5022 Chronic systolic (congestive) heart failure: Secondary | ICD-10-CM

## 2021-12-09 NOTE — Progress Notes (Signed)
EPIC Encounter for ICM Monitoring  Patient Name: Nicole Clements is a 81 y.o. female Date: 12/09/2021 Primary Care Physican: Lupita Raider, MD Primary Cardiologist: Wyline Mood Electrophysiologist: Joycelyn Schmid Pacing: 94.4%    11/19/2021 Weight:  144 lbs 12/09/2021 Weight: 138 lbs   Since 27-Nov-2021 Time in AT/AF  <0.1 hr/day (<0.1%)   Spoke with patient and heart failure questions reviewed.  Pt asymptomatic for fluid accumulation.  Reports feeling well at this time and voices no complaints.    Optivol thoracic impedance suggesting fluid levels returned to normal.    Prescribed:   Furosemide 20 mg Take 20 mg daily. You may take an extra 20 mg daily as needed for swelling.   Potassium 20 mEq take 1 tablet daily Spironolactone 25 mg take 0.5 tablet (12.5 mg total) twice a day   Labs:  11/19/2021 Creatinine 1.60, BUN 18, Potassium 4.0, Sodium 132, GFR 33 10/23/2021 Creatinine 1.40, BUN 10, Potassium 4.3, Sodium 134, GFR 39  Care Everywhere office note 10/15/2021 Creatinine 2.46, BUN 43, Potassium 5.0, Sodium 127, GFR 20  Care Everywhere office note 08/01/2021 Creatinine 1.65, BUN 26, Potassium 4.7, Sodium 131, GFR 32 A complete set of results can be found in Results Review.   Recommendations:  No changes and encouraged to call if experiencing any fluid symptoms.   Follow-up plan: ICM clinic phone appointment on 12/31/2021.  91 day device clinic remote scheduled 12/30/2021.     EP/Cardiology Office Visits:  04/04/2022 with Dr Wyline Mood.     Copy of ICM check sent to Dr. Graciela Husbands.    3 month ICM trend: 12/09/2021.    12-14 Month ICM trend:     Karie Soda, RN 12/09/2021 10:37 AM

## 2021-12-30 ENCOUNTER — Ambulatory Visit (INDEPENDENT_AMBULATORY_CARE_PROVIDER_SITE_OTHER): Payer: Medicare Other

## 2021-12-30 DIAGNOSIS — I255 Ischemic cardiomyopathy: Secondary | ICD-10-CM

## 2021-12-30 LAB — CUP PACEART REMOTE DEVICE CHECK
Battery Remaining Longevity: 9 mo
Battery Voltage: 2.85 V
Brady Statistic AP VP Percent: 98.41 %
Brady Statistic AP VS Percent: 1.49 %
Brady Statistic AS VP Percent: 0.08 %
Brady Statistic AS VS Percent: 0.02 %
Brady Statistic RA Percent Paced: 92.55 %
Brady Statistic RV Percent Paced: 53.07 %
Date Time Interrogation Session: 20230227022722
HighPow Impedance: 57 Ohm
Implantable Lead Implant Date: 20160615
Implantable Lead Implant Date: 20160615
Implantable Lead Implant Date: 20160615
Implantable Lead Location: 753858
Implantable Lead Location: 753859
Implantable Lead Location: 753860
Implantable Lead Model: 4398
Implantable Lead Model: 5076
Implantable Pulse Generator Implant Date: 20160615
Lead Channel Impedance Value: 1026 Ohm
Lead Channel Impedance Value: 1026 Ohm
Lead Channel Impedance Value: 399 Ohm
Lead Channel Impedance Value: 399 Ohm
Lead Channel Impedance Value: 418 Ohm
Lead Channel Impedance Value: 418 Ohm
Lead Channel Impedance Value: 456 Ohm
Lead Channel Impedance Value: 475 Ohm
Lead Channel Impedance Value: 513 Ohm
Lead Channel Impedance Value: 665 Ohm
Lead Channel Impedance Value: 722 Ohm
Lead Channel Impedance Value: 760 Ohm
Lead Channel Impedance Value: 950 Ohm
Lead Channel Pacing Threshold Amplitude: 0.625 V
Lead Channel Pacing Threshold Amplitude: 0.625 V
Lead Channel Pacing Threshold Amplitude: 1.25 V
Lead Channel Pacing Threshold Pulse Width: 0.4 ms
Lead Channel Pacing Threshold Pulse Width: 0.4 ms
Lead Channel Pacing Threshold Pulse Width: 0.4 ms
Lead Channel Sensing Intrinsic Amplitude: 1.5 mV
Lead Channel Sensing Intrinsic Amplitude: 1.5 mV
Lead Channel Sensing Intrinsic Amplitude: 14.375 mV
Lead Channel Sensing Intrinsic Amplitude: 14.375 mV
Lead Channel Setting Pacing Amplitude: 1.5 V
Lead Channel Setting Pacing Amplitude: 2 V
Lead Channel Setting Pacing Amplitude: 2.25 V
Lead Channel Setting Pacing Pulse Width: 0.4 ms
Lead Channel Setting Pacing Pulse Width: 0.4 ms
Lead Channel Setting Sensing Sensitivity: 0.45 mV

## 2021-12-31 ENCOUNTER — Ambulatory Visit (INDEPENDENT_AMBULATORY_CARE_PROVIDER_SITE_OTHER): Payer: Medicare Other

## 2021-12-31 DIAGNOSIS — Z95810 Presence of automatic (implantable) cardiac defibrillator: Secondary | ICD-10-CM

## 2021-12-31 DIAGNOSIS — I5022 Chronic systolic (congestive) heart failure: Secondary | ICD-10-CM | POA: Diagnosis not present

## 2022-01-03 NOTE — Progress Notes (Signed)
EPIC Encounter for ICM Monitoring  Patient Name: Nicole Clements is a 81 y.o. female Date: 01/03/2022 Primary Care Physican: Lupita Raider, MD Primary Cardiologist: Wyline Mood Electrophysiologist: Joycelyn Schmid Pacing: 94.4%    11/19/2021 Weight:  144 lbs 01/03/2022 Weight: 138 lbs   Time in AT/AF  <0.1 hr/day (<0.1%)   Spoke with patient and heart failure questions reviewed.  Pt asymptomatic for fluid accumulation.  Reports feeling well at this time and voices no complaints.    Optivol thoracic impedance suggesting normal fluid levels.    Prescribed:   Furosemide 20 mg Take 20 mg daily. You may take an extra 20 mg daily as needed for swelling.   Potassium 20 mEq take 1 tablet daily Spironolactone 25 mg take 0.5 tablet (12.5 mg total) twice a day   Labs:  11/19/2021 Creatinine 1.60, BUN 18, Potassium 4.0, Sodium 132, GFR 33 10/23/2021 Creatinine 1.40, BUN 10, Potassium 4.3, Sodium 134, GFR 39  Care Everywhere office note 10/15/2021 Creatinine 2.46, BUN 43, Potassium 5.0, Sodium 127, GFR 20  Care Everywhere office note 08/01/2021 Creatinine 1.65, BUN 26, Potassium 4.7, Sodium 131, GFR 32 A complete set of results can be found in Results Review.   Recommendations:  No changes and encouraged to call if experiencing any fluid symptoms.   Follow-up plan: ICM clinic phone appointment on 02/03/2022.  91 day device clinic remote scheduled 04/01/2022.     EP/Cardiology Office Visits:  04/04/2022 with Dr Wyline Mood.     Copy of ICM check sent to Dr. Graciela Husbands.    3 month ICM trend: 12/30/2021.    12-14 Month ICM trend:     Karie Soda, RN 01/03/2022 3:50 PM

## 2022-01-06 NOTE — Progress Notes (Signed)
Remote ICD transmission.   

## 2022-01-27 ENCOUNTER — Other Ambulatory Visit: Payer: Self-pay | Admitting: Cardiology

## 2022-02-03 ENCOUNTER — Ambulatory Visit (INDEPENDENT_AMBULATORY_CARE_PROVIDER_SITE_OTHER): Payer: Medicare Other

## 2022-02-03 DIAGNOSIS — Z95810 Presence of automatic (implantable) cardiac defibrillator: Secondary | ICD-10-CM | POA: Diagnosis not present

## 2022-02-03 DIAGNOSIS — I5022 Chronic systolic (congestive) heart failure: Secondary | ICD-10-CM | POA: Diagnosis not present

## 2022-02-05 NOTE — Progress Notes (Signed)
EPIC Encounter for ICM Monitoring ? ?Patient Name: Nicole Clements is a 81 y.o. female ?Date: 02/05/2022 ?Primary Care Physican: Lupita Raider, MD ?Primary Cardiologist: Wyline Mood ?Electrophysiologist: Graciela Husbands ?Bi-V Pacing: 93.1%    ?01/03/2022 Weight: 138 lbs ?02/03/2022 Weight: 138 lbs ?  ?Time in AT/AF  <0.1 hr/day (<0.1%) ?  ?Spoke with patient and heart failure questions reviewed.  Pt asymptomatic for fluid accumulation.  Reports feeling well at this time and voices no complaints.  ?  ?Optivol thoracic impedance suggesting normal fluid levels.  ?  ?Prescribed:   ?Furosemide 20 mg Take 20 mg daily. You may take an extra 20 mg daily as needed for swelling.   ?Potassium 20 mEq take 1 tablet daily ?Spironolactone 25 mg take 0.5 tablet (12.5 mg total) twice a day ?  ?Labs:  ?11/19/2021 Creatinine 1.60, BUN 18, Potassium 4.0, Sodium 132, GFR 33 ?10/23/2021 Creatinine 1.40, BUN 10, Potassium 4.3, Sodium 134, GFR 39  Care Everywhere office note ?10/15/2021 Creatinine 2.46, BUN 43, Potassium 5.0, Sodium 127, GFR 20  Care Everywhere office note ?08/01/2021 Creatinine 1.65, BUN 26, Potassium 4.7, Sodium 131, GFR 32 ?A complete set of results can be found in Results Review. ?  ?Recommendations:  No changes and encouraged to call if experiencing any fluid symptoms. ?  ?Follow-up plan: ICM clinic phone appointment on 03/10/2022.  91 day device clinic remote scheduled 04/01/2022.   ?  ?EP/Cardiology Office Visits:  04/04/2022 with Dr Wyline Mood.   ?  ?Copy of ICM check sent to Dr. Graciela Husbands.   ? ?3 month ICM trend: 02/03/2022. ? ? ? ?12-14 Month ICM trend:  ? ? ? ?Karie Soda, RN ?02/05/2022 ?4:52 PM ? ?

## 2022-03-03 ENCOUNTER — Other Ambulatory Visit: Payer: Self-pay | Admitting: Cardiology

## 2022-03-10 ENCOUNTER — Ambulatory Visit (INDEPENDENT_AMBULATORY_CARE_PROVIDER_SITE_OTHER): Payer: Medicare Other

## 2022-03-10 DIAGNOSIS — I5022 Chronic systolic (congestive) heart failure: Secondary | ICD-10-CM | POA: Diagnosis not present

## 2022-03-10 DIAGNOSIS — Z95810 Presence of automatic (implantable) cardiac defibrillator: Secondary | ICD-10-CM

## 2022-03-13 NOTE — Progress Notes (Signed)
EPIC Encounter for ICM Monitoring ? ?Patient Name: Nicole Clements is a 81 y.o. female ?Date: 03/13/2022 ?Primary Care Physican: Lupita Raider, MD ?Primary Cardiologist: Wyline Mood ?Electrophysiologist: Graciela Husbands ?Bi-V Pacing: 90.7%    ?01/03/2022 Weight: 138 lbs ?02/03/2022 Weight: 138 lbs ?  ?Time in AT/AF  <0.1 hr/day (<0.1%) ?  ?Spoke with patient and heart failure questions reviewed.  Pt asymptomatic for fluid accumulation.  Reports feeling well at this time and voices no complaints.   ?  ?Optivol thoracic impedance suggesting normal fluid levels.  ?  ?Prescribed:   ?Furosemide 20 mg Take 20 mg daily. You may take an extra 20 mg daily as needed for swelling.   ?Potassium 20 mEq take 1 tablet daily ?Spironolactone 25 mg take 0.5 tablet (12.5 mg total) twice a day ?  ?Labs:  ?11/19/2021 Creatinine 1.60, BUN 18, Potassium 4.0, Sodium 132, GFR 33 ?10/23/2021 Creatinine 1.40, BUN 10, Potassium 4.3, Sodium 134, GFR 39  Care Everywhere office note ?10/15/2021 Creatinine 2.46, BUN 43, Potassium 5.0, Sodium 127, GFR 20  Care Everywhere office note ?08/01/2021 Creatinine 1.65, BUN 26, Potassium 4.7, Sodium 131, GFR 32 ?A complete set of results can be found in Results Review. ?  ?Recommendations:  No changes and encouraged to call if experiencing any fluid symptoms. ?  ?Follow-up plan: ICM clinic phone appointment on 04/14/2022.  91 day device clinic remote scheduled 04/01/2022.   ?  ?EP/Cardiology Office Visits:  04/04/2022 with Dr Wyline Mood.   ?  ?Copy of ICM check sent to Dr. Graciela Husbands.    ? ?3 month ICM trend: 03/10/2022. ? ? ? ?12-14 Month ICM trend:  ? ? ? ?Karie Soda, RN ?03/13/2022 ?8:22 AM ? ?

## 2022-03-17 ENCOUNTER — Other Ambulatory Visit: Payer: Self-pay | Admitting: Cardiology

## 2022-04-01 ENCOUNTER — Ambulatory Visit (INDEPENDENT_AMBULATORY_CARE_PROVIDER_SITE_OTHER): Payer: Medicare Other

## 2022-04-01 DIAGNOSIS — I5022 Chronic systolic (congestive) heart failure: Secondary | ICD-10-CM | POA: Diagnosis not present

## 2022-04-01 LAB — CUP PACEART REMOTE DEVICE CHECK
Battery Remaining Longevity: 7 mo
Battery Voltage: 2.83 V
Brady Statistic AP VP Percent: 98.17 %
Brady Statistic AP VS Percent: 1.61 %
Brady Statistic AS VP Percent: 0.1 %
Brady Statistic AS VS Percent: 0.12 %
Brady Statistic RA Percent Paced: 93.01 %
Brady Statistic RV Percent Paced: 50.35 %
Date Time Interrogation Session: 20230530052606
HighPow Impedance: 55 Ohm
Implantable Lead Implant Date: 20160615
Implantable Lead Implant Date: 20160615
Implantable Lead Implant Date: 20160615
Implantable Lead Location: 753858
Implantable Lead Location: 753859
Implantable Lead Location: 753860
Implantable Lead Model: 4398
Implantable Lead Model: 5076
Implantable Pulse Generator Implant Date: 20160615
Lead Channel Impedance Value: 1064 Ohm
Lead Channel Impedance Value: 1064 Ohm
Lead Channel Impedance Value: 1064 Ohm
Lead Channel Impedance Value: 361 Ohm
Lead Channel Impedance Value: 399 Ohm
Lead Channel Impedance Value: 399 Ohm
Lead Channel Impedance Value: 456 Ohm
Lead Channel Impedance Value: 456 Ohm
Lead Channel Impedance Value: 456 Ohm
Lead Channel Impedance Value: 532 Ohm
Lead Channel Impedance Value: 665 Ohm
Lead Channel Impedance Value: 665 Ohm
Lead Channel Impedance Value: 722 Ohm
Lead Channel Pacing Threshold Amplitude: 0.625 V
Lead Channel Pacing Threshold Amplitude: 0.75 V
Lead Channel Pacing Threshold Amplitude: 1.25 V
Lead Channel Pacing Threshold Pulse Width: 0.4 ms
Lead Channel Pacing Threshold Pulse Width: 0.4 ms
Lead Channel Pacing Threshold Pulse Width: 0.4 ms
Lead Channel Sensing Intrinsic Amplitude: 1.5 mV
Lead Channel Sensing Intrinsic Amplitude: 1.5 mV
Lead Channel Sensing Intrinsic Amplitude: 12.875 mV
Lead Channel Sensing Intrinsic Amplitude: 12.875 mV
Lead Channel Setting Pacing Amplitude: 1.5 V
Lead Channel Setting Pacing Amplitude: 2 V
Lead Channel Setting Pacing Amplitude: 2.25 V
Lead Channel Setting Pacing Pulse Width: 0.4 ms
Lead Channel Setting Pacing Pulse Width: 0.4 ms
Lead Channel Setting Sensing Sensitivity: 0.45 mV

## 2022-04-04 ENCOUNTER — Ambulatory Visit (INDEPENDENT_AMBULATORY_CARE_PROVIDER_SITE_OTHER): Payer: Medicare Other | Admitting: Cardiology

## 2022-04-04 ENCOUNTER — Encounter: Payer: Self-pay | Admitting: Cardiology

## 2022-04-04 VITALS — BP 120/58 | HR 76 | Ht 62.0 in | Wt 137.2 lb

## 2022-04-04 DIAGNOSIS — I5022 Chronic systolic (congestive) heart failure: Secondary | ICD-10-CM

## 2022-04-04 DIAGNOSIS — N183 Chronic kidney disease, stage 3 unspecified: Secondary | ICD-10-CM

## 2022-04-04 DIAGNOSIS — I2510 Atherosclerotic heart disease of native coronary artery without angina pectoris: Secondary | ICD-10-CM | POA: Diagnosis not present

## 2022-04-04 DIAGNOSIS — E782 Mixed hyperlipidemia: Secondary | ICD-10-CM

## 2022-04-04 DIAGNOSIS — I255 Ischemic cardiomyopathy: Secondary | ICD-10-CM | POA: Diagnosis not present

## 2022-04-04 NOTE — Patient Instructions (Signed)

## 2022-04-04 NOTE — Progress Notes (Signed)
Clinical Summary Nicole Clements is a 81 y.o.female seen today for follow up of the following medical problems.    1. 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 Admit 07/2014 with CHF and troponin elevation - cath 07/2014, LM 30% distal, LAD mid 50%, LCX ostial 50% and mid 50%, small intermediate Ferris Tally 70%. RCA diffuse 50%, distal RCA with ulcerated 90% lesion, PDA 80-90% too small for PCI. LVEF 30%. Received DES to distal RCA, post procedure the PDA became occluded.   - echo 07/2014 LVEF 20%, inferior akinesis, grade II diastolic dysfunction. Repeat echo 10/2014 LVEF remains 15-20%.   - 04/2015 CRT-D device placed by Dr Caryl Comes. Normal function Jan 2018.  10/2015 echo LVEF 25-30%  - could not afford entresto, back on losartan.     04/2021 echo LVEF 25-30%   - no SOB/DOE, no edema. No chest pains - compliant with meds. Wilder Glade and entresto were too expensive - home weights 137 lbs and stable.  - 04/01/22 normal device check       2. Hyperlipidemia  - atorva was too expensive, changed to pravastatin by pcp   10/2020 TC 104 TG 73 HDL 40 LDL 64 - more recent labs with pcp    3. CKD III - followed by pcp - Jan 2023 Cr 1.6       Past Medical History:  Diagnosis Date   AICD (automatic cardioverter/defibrillator) present    CAD (coronary artery disease) April 2014   DES to PLA, occluded PDA 07/2014   CHF (congestive heart failure) (HCC)    Hypercholesterolemia    Hypertension    Ischemic cardiomyopathy    LVEF 25%-45%   IVCD (intraventricular conduction defect)    PVC's (premature ventricular contractions)    S/P colonoscopy August 2007   Hyperplastic polyps, rare sigmoid and descending colon diverticulosis, small internal hemorrhoids   STEMI (ST elevation myocardial infarction) ([place]) 07/22/1996     Allergies  Allergen Reactions   Lactose Intolerance (Gi) Other (See Comments)    GI Upset    Sulfa Antibiotics Rash     Current Outpatient Medications   Medication Sig Dispense Refill   acetaminophen (TYLENOL) 500 MG tablet Take 1,000 mg by mouth every 8 (eight) hours as needed for mild pain or headache.     aspirin EC 81 MG tablet Take 1 tablet (81 mg total) by mouth daily. 90 tablet 3   carvedilol (COREG) 25 MG tablet TAKE 1 TABLET BY MOUTH TWICE DAILY 60 tablet 6   Cholecalciferol (VITAMIN D3) 2000 UNITS capsule Take 2,000 Units by mouth daily.     furosemide (LASIX) 20 MG tablet TAKE 1 TABLET BY MOUTH EVERY DAY. YOU MAKE TAKE AN 20 MG EVERY DAY AS NEEDED FOR SWELLING. 180 tablet 0   losartan (COZAAR) 25 MG tablet TAKE 1 TABLET(25 MG) BY MOUTH DAILY 90 tablet 3   Magnesium Oxide 400 MG CAPS Take 466m by mouth twice a day x 5 days, then 4071mby mouth daily thereafter.     nitroGLYCERIN (NITROSTAT) 0.4 MG SL tablet Place 1 tablet (0.4 mg total) under the tongue every 5 (five) minutes x 3 doses as needed for chest pain. 25 tablet 2   Omega-3 Fatty Acids (FISH OIL) 1200 MG CAPS Take 1,200 mg by mouth daily.     potassium chloride SA (KLOR-CON) 20 MEQ tablet TAKE 1 TABLET(20 MEQ) BY MOUTH DAILY 90 tablet 2   pravastatin (PRAVACHOL) 80 MG tablet Take 80 mg by mouth daily.  spironolactone (ALDACTONE) 25 MG tablet TAKE 1/2 TABLET(12.5 MG) BY MOUTH TWICE DAILY 90 tablet 3   No current facility-administered medications for this visit.     Past Surgical History:  Procedure Laterality Date   ABDOMINAL HYSTERECTOMY     CARDIAC CATHETERIZATION  April 2014   Med Rx   CATARACT EXTRACTION W/PHACO Right 09/15/2017   Procedure: CATARACT EXTRACTION PHACO AND INTRAOCULAR LENS PLACEMENT ([place]);  Surgeon: Rutherford Guys, MD;  Location: [facility];  Service: Ophthalmology;  Laterality: Right;  CDE: 12.22   CATARACT EXTRACTION W/PHACO Left 09/29/2017   Procedure: CATARACT EXTRACTION PHACO AND INTRAOCULAR LENS PLACEMENT ([facility]);  Surgeon: Rutherford Guys, MD;  Location: [facility];  Service: Ophthalmology;  Laterality: Left;  CDE: 10.02   COLONOSCOPY N/A 12/24/2015    Dr. Fields:moderate sized internal hemorrhoids/moderate diverticulosis, negative microscopic colitis    EP IMPLANTABLE DEVICE  04/18/2015   BV ICD   EP IMPLANTABLE DEVICE N/A 04/18/2015   Procedure: BiV ICD Insertion CRT-D;  Surgeon: Deboraha Sprang, MD;  Location: [facility];  Service: Cardiovascular;  Laterality: N/A;   LEFT AND RIGHT HEART CATHETERIZATION WITH CORONARY ANGIOGRAM N/A 07/17/2014   Procedure: LEFT AND RIGHT HEART CATHETERIZATION WITH CORONARY ANGIOGRAM;  Surgeon: Blane Ohara, MD;  Location: [facility];  Service: Cardiovascular;  Laterality: N/A;   PERCUTANEOUS CORONARY STENT INTERVENTION (PCI-S)  07/17/2014   Procedure: PERCUTANEOUS CORONARY STENT INTERVENTION (PCI-S);  Surgeon: Blane Ohara, MD;  Location: [facility] CATH LAB;  Service: Cardiovascular;;  Distal RCA   S/P Hysterectomy       Allergies  Allergen Reactions   Lactose Intolerance (Gi) Other (See Comments)    GI Upset    Sulfa Antibiotics Rash      Family History  Problem Relation Age of Onset   Hypertension Sister    Diabetes Mellitus II Sister    Hypertension Brother    Diabetes Mellitus II Brother    Hypertension Brother    Diabetes Mellitus II Brother    Hypertension Brother    Diabetes Mellitus II Brother    Colon cancer Neg Hx      Social History Ms. Robertshaw reports that she has been smoking cigarettes. She started smoking about 60 years ago. She has a 25.00 pack-year smoking history. She has never used smokeless tobacco. Ms. Kapral reports no history of alcohol use.   Review of Systems CONSTITUTIONAL: No weight loss, fever, chills, weakness or fatigue.  HEENT: Eyes: No visual loss, blurred vision, double vision or yellow sclerae.No hearing loss, sneezing, congestion, runny nose or sore throat.  SKIN: No rash or itching.  CARDIOVASCULAR: per hpi RESPIRATORY: No shortness of breath, cough or sputum.  GASTROINTESTINAL: No anorexia, nausea, vomiting or diarrhea. No abdominal pain or  blood.  GENITOURINARY: No burning on urination, no polyuria NEUROLOGICAL: No headache, dizziness, syncope, paralysis, ataxia, numbness or tingling in the extremities. No change in bowel or bladder control.  MUSCULOSKELETAL: No muscle, back pain, joint pain or stiffness.  LYMPHATICS: No enlarged nodes. No history of splenectomy.  PSYCHIATRIC: No history of depression or anxiety.  ENDOCRINOLOGIC: No reports of sweating, cold or heat intolerance. No polyuria or polydipsia.  [name]   Physical Examination There were no vitals filed for this visit. Filed Weights   04/04/22 1107  Weight: 137 lb 3.2 oz (62.2 kg)    Gen: resting comfortably, no acute distress HEENT: no scleral icterus, pupils equal round and reactive, no palptable cervical adenopathy,  CV: RRR, no m/r/g, no jvd Resp: Clear  to auscultation bilaterally GI: abdomen is soft, non-tender, non-distended, normal bowel sounds, no hepatosplenomegaly MSK: extremities are warm, no edema.  Skin: warm, no rash Neuro:  no focal deficits Psych: appropriate affect   Diagnostic Studies  02/2013 Cath   Procedural Findings:   Hemodynamics:   AO 116/45 with a mean of 70   LV 120/18   Coronary angiography:   Coronary dominance: right   Left mainstem: The left main is patent with 30% distal left main stenosis as the vessel trifurcates into the LAD, intermediate Leelan Rajewski, and left circumflex.   Left anterior descending (LAD): The LAD is patent with diffuse disease noted. There is minimal calcification present. The first diagonal is patent with 30-50% diffuse disease. The LAD after the first septal perforator has 40-50% stenosis. The vessel reaches the apex and wraps around the left ventricular apex without significant stenosis.   Left circumflex (LCx): The intermediate Ayano Douthitt is small in caliber and diffusely diseased with 50-60% proximal vessel stenosis the AV groove circumflex is diffusely diseased with 50% ostial stenosis and 30% mid stenosis it  supplies 2 small posterolateral Anirudh Baiz   Right coronary artery (RCA): The RCA is diffusely diseased. There is significant tortuosity, especially around the junction of the mid and distal vessel. The vessel is diffusely calcified. The mid vessel has tandem 50% stenoses. The distal vessel has 80-90 % stenosis just before the bifurcation of the PDA and posterior AV segment. The PDA and posterior AV segment with 3 posterolateral branches are patent.   Left ventriculography: Left ventricular systolic function is moderately depressed. There is severe hypokinesis of the basal and midinferior walls. The anterolateral and apical walls contract normally. The left ventricular ejection fraction is estimated at 45%.   Abdominal aortogram: The abdominal aorta is patent. The renal arteries are single bilaterally and they are widely patent. There appears to be nonobstructive stenosis of the right common iliac artery at the aortoiliac junction. The left common iliac artery appears to have significant ostial stenosis estimated at at least 75% angiographically. The visualized portions of the external iliac and common femoral arteries are patent   Final Conclusions:  1. Severe single-vessel coronary artery disease involving the distal right coronary artery   2. Moderate LV dysfunction with segmental severe hypokinesis of the inferior wall and normal contraction of the antero-apex with estimated left ventricular ejection fraction of 45%.   3. Severe left common iliac artery stenosis   Recommendations: The patient has severe distal RCA disease. She has a known history of inferior wall infarction and remote PCA. This fits with that clinical history. She has nonobstructive disease of the left coronary artery. Her left ventricular ejection fraction by ventriculography is clearly greater than 35%. With her history of previous MI, I do not think PCI is indicated with an absence of angina. Will carefully discussed symptoms with the  patient, but I am inclined to treat her medically. Will also review indication for consideration of peripheral intervention for her iliac disease.       01/2013 Echo   Study Conclusions  - Left ventricle: The cavity size was mildly to moderately dilated. Wall thickness was normal. Systolic function was severely reduced. The estimated ejection fraction was 25%. Severe diffuse hypokinesis. - Aortic valve: Mildly calcified annulus. Trileaflet. - Mitral valve: Calcified annulus. - Left atrium: The atrium was moderately dilated. - Right ventricle: The cavity size was normal. Wall thickness was mildly to moderately increased. - Right atrium: The atrium was mildly dilated. - Atrial septum: No  defect or patent foramen ovale was identified. Impressions:  - Compared to the prior study of 03/13/11, there has been substantial deterioration in LV systolic function.   07/2014 Echo Study Conclusions  - Left ventricle: The cavity size was severely dilated. Wall thickness was normal. The estimated ejection fraction was 20%. Diffuse hypokinesis. There is akinesis of the inferolateral and inferior myocardium. Features are consistent with a pseudonormal left ventricular filling pattern, with concomitant abnormal relaxation and increased filling pressure (grade 2 diastolic dysfunction). Doppler parameters are consistent with high ventricular filling pressure. - Mitral valve: There was mild to moderate regurgitation. - Left atrium: The atrium was moderately dilated.  Impressions:  - Global hypokinesis; inferior and inferolateral akinesis; overall severely reduced LV function; grade 2 diastolic dysfunction with elevated left ventricular filling pressures, moderate LAE; mild to moderate MR.  07/2014 Cath PROCEDURAL FINDINGS   Hemodynamics:   AO 122/48   LV 118/14   RA 9   RV 41/11   PA 37/5 with a mean of 21   PCWP 27   Oxygen saturations   AO 90   PA 56   Cardiac output 3.5   Cardiac  index 2.0   Coronary angiography:   Coronary dominance: right   Left mainstem: The left mainstem is patent with 30% distal left main stenosis.   Left anterior descending (LAD): The LAD is diffusely diseased in the midportion. There are sequential 50% stenoses, unchanged from previous study. The first diagonal Djeneba Barsch is large in distribution with diffuse nonobstructive disease noted. The mid and distal LAD are patent without significant disease.   Left circumflex (LCx): The left circumflex has 50% ostial stenosis, 50% mid vessel stenosis, and to OM branches with no significant disease. There is an intermediate Darrick Greenlaw is diffusely diseased with up to 70% stenosis. This vessel is small in caliber.   Right coronary artery (RCA): Dominant vessel. Tortuous with diffuse nonobstructive stenosis throughout the proximal and mid portions. There are diffuse 50% stenosis present. The distal vessel has an ulcerated-appearing irregular 90% stenosis involving the bifurcation of the PDA and posterolateral branches. The posterolateral branches are patent with mild diffuse disease. The PDA arises an acute angulation with 30-40% stenosis. The mid body of the PDA has an 80-90% stenosis in an area where the vessel is too small for PCI.   Left ventriculography: The ventricle appears the synchronous. The basal and midinferior wall are akinetic. The basal inferolateral wall is severely hypokinetic. The anterolateral wall and apex contract normally. The estimated LVEF is 30%.   PCI Procedure Note: Following the diagnostic procedure, the decision was made to proceed with PCI of the distal RCA. The lesion is complex, there is a large amount of myocardium involved. I felt there was a risk of potentially compromising the PDA Ferrin Liebig, but overall in my opinion the risk/benefit was favorable for PCI considering the severe stenosis involving the entire RCA distribution. The sheath was upsized to a 6 [place]. Weight-based bivalirudin was given  for anticoagulation. The patient was loaded with Plavix 600 mg on the table. Once a therapeutic ACT was achieved, a 6 [place] JR 4 guide catheter was inserted. A cougar coronary guidewire was used to cross the lesion into the PLA Tymesha Ditmore. The lesion was predilated with a 2.5 x 15 mm balloon. The lesion was then stented with a 3.0 by 79m Promus DES stent. The stent was postdilated with a 3.25 mm noncompliant balloon to 18 atmospheres. The PDA was stented across as the stent extended from the  distal RCA proximal PLA Anneth Brunell. The PDA was totally occluded. I made a long attempt with a whisper wire using multiple bands on the tip of the wire, but all of these attempts were unsuccessful at rewiring the PDA. The patient was completely asymptomatic and specifically denied any chest pain. Her hemodynamics remained stable and there were no changes in her rhythm or blood pressure. A final image of the left coronary artery was obtained and this demonstrated a collateral from the LAD to the PDA Chamar Broughton. Following PCI, there was 0% residual stenosis and TIMI-3 flow. Final angiography confirmed an excellent result. Femoral hemostasis was achieved will be achieved with manual hemostasis. The patient tolerated the PCI procedure well. There were no immediate procedural complications. The patient was transferred to the post catheterization recovery area for further monitoring.   PCI Data:   Vessel - RCA/Segment - distal   Percent Stenosis (pre) 90   TIMI-flow 3   Stent 3.0 by 34m Promus DES   Percent Stenosis (post) 0   TIMI-flow (post) 3   Contrast: 175 cc Omnipaque   Radiation dose/Fluoro time: 15.5 minutes   Estimated Blood Loss: Minimal   Final Conclusions:  1. Nonobstructive disease involving the LAD, left circumflex, and OM/diagonal side branches   2. Severe stenosis of the distal RCA bifurcation, treated successfully with a drug-eluting stent into the PLA Rozelle Caudle with residual occlusion of the PDA Khalif Stender   3. Severe  segmental LV systolic dysfunction   Recommendations: Dual antiplatelet therapy with aspirin and Plavix for at least 12 months. Will cycle cardiac enzymes since the PDA Nolawi Kanady is occluded after stenting.   10/2014 Echo Study Conclusions  - Procedure narrative: Transthoracic echocardiography. Image   quality was suboptimal. The study was technically difficult, as a   result of poor sound wave transmission and body habitus. - Left ventricle: Systolic function is severely reduced, estimated   EF 15-20%. The cavity size was severely dilated. Doppler   parameters are consistent with abnormal left ventricular   relaxation (grade 1 diastolic dysfunction). Doppler parameters   are consistent with high ventricular filling pressure. Medial   E/e&' 24. - Regional wall motion abnormality: Akinesis of the basal-mid   inferior and basal inferolateral myocardium; severe hypokinesis   of the apical inferior and mid inferolateral myocardium. Severe,   diffuse hypokinesis. - Ventricular septum: Septal motion showed abnormal function and   dyssynergy. These changes are consistent with intraventricular   conduction delay. - Aortic valve: Mildly calcified annulus. Trileaflet; mildly   thickened leaflets. - Mitral valve: Mildly dilated annulus. Mildly thickened leaflets .   There was moderate eccentric regurgitation. Likely functional due   to mitral annular dilatation. - Left atrium: The atrium was severely dilated. Volume/bsa, S: 47.1   ml/m^2. - Right ventricle: Systolic function was mildly reduced. - Right atrium: The atrium was mildly dilated. - Atrial septum: There was increased thickness of the septum,   consistent with lipomatous hypertrophy. - Tricuspid valve: There was mild regurgitation. - Pulmonary arteries: PA peak pressure: 42 mm Hg (S). Mildly   elevated pulmonary pressures. - Pericardium, extracardiac: There was a small pericardial   effusion, with no evidence of tamponade physiology.    04/2015 Carotid [place]: 1. Mild (1-49%) stenosis of the proximal right internal carotid artery secondary to smooth heterogeneous atherosclerotic plaque. No interval change compared to recent prior imaging from earlier this month. 2. Mild (1-49%) stenosis of the proximal left internal carotid artery secondary to heterogeneous and irregular atherosclerotic plaque. No  interval change compared to recent prior imaging from earlier this month. 3. Vertebral arteries remain patent with normal antegrade flow. Signed,       10/2015 echo Study Conclusions  - Left ventricle: The cavity size was moderately to severely   dilated. Wall thickness was normal. Systolic function was   severely reduced. The estimated ejection fraction was in the   range of 25% to 30%. Diffuse hypokinesis. There is akinesis of   the basalanteroseptal and anterior myocardium. Doppler parameters   are consistent with abnormal left ventricular relaxation (grade 1   diastolic dysfunction). - Mitral valve: Calcified annulus. Mildly calcified leaflets .   There was mild regurgitation. - Right atrium: Central venous pressure (est): 3 mm Hg. - Atrial septum: No defect or patent foramen ovale was identified. - Tricuspid valve: There was trivial regurgitation. - Pulmonary arteries: PA peak pressure: 27 mm Hg (S). - Pericardium, extracardiac: A prominent pericardial fat pad was   present.  Impressions:  - Moderate to severe LV chamber dilatation with LVEF approximately   25-30%. There is diffuse hypokinesis with near akinesis of the   basal anteroseptal and anterior myocardium. Compared to the   previous study from June 2016 there has been some improvement in   LVEF. Grade 1 diastolic dysfunction. MAC with mild mitral   regurgitation. Trivial tricuspid regurgitation with PASP 27 mmHg.  Transthoracic echocardiography.  M-mode, complete 2D, spectral Doppler, and color Doppler.  Birthdate:  Patient  birthdate: 10/01/1941.  Age:  Patient is 81 yr old.  Sex:  Gender: female. BMI: 27.5 kg/m^2.  Blood pressure:     98/47  Patient status: Inpatient.  Study date:  Study date: 10/04/2015. Study time: 11:27 AM.  Location:  Echo laboratory     04/2021 echo IMPRESSIONS     1. Left ventricular ejection fraction, by estimation, is 25 to 30%. The  left ventricle has severely decreased function. The left ventricle  demonstrates global hypokinesis. The left ventricular internal cavity size  was moderately dilated. Left  ventricular diastolic parameters are consistent with Grade I diastolic  dysfunction (impaired relaxation). The average left ventricular global  longitudinal strain is -10.3 %. The global longitudinal strain is  abnormal.   2. Right ventricular systolic function is normal. The right ventricular  size is normal.   3. A small pericardial effusion is present. The pericardial effusion is  localized near the right atrium.   4. The mitral valve is normal in structure. Mild to moderate mitral valve  regurgitation. No evidence of mitral stenosis.   5. The aortic valve has an indeterminant number of cusps. Aortic valve  regurgitation is not visualized. No aortic stenosis is present.      Assessment and Plan   1. CAD - no recent symptoms, continue current meds     2. Chronic systolic HF - Medical therpay limited by soft bp's and renal insufficiency.Entresto and farxiga were too expensive - no symptoms, euvolemic today. Continue current meds   3. Hyperlipidemia - statin changed previously due to cost, -request pcp labs, continue pravastatin.    4. CKD 3 - limit nephrotoxic drugs, Cr has been stable last several checks.        Arnoldo Lenis, M.D.

## 2022-04-10 DIAGNOSIS — Z961 Presence of intraocular lens: Secondary | ICD-10-CM | POA: Diagnosis not present

## 2022-04-16 NOTE — Progress Notes (Signed)
Remote ICD transmission.   

## 2022-04-18 NOTE — Progress Notes (Signed)
No ICM remote transmission received for 04/14/2022 and next ICM transmission scheduled for 05/05/2022.   

## 2022-05-05 ENCOUNTER — Ambulatory Visit (INDEPENDENT_AMBULATORY_CARE_PROVIDER_SITE_OTHER): Payer: Medicare Other

## 2022-05-05 DIAGNOSIS — I5022 Chronic systolic (congestive) heart failure: Secondary | ICD-10-CM

## 2022-05-05 DIAGNOSIS — Z95810 Presence of automatic (implantable) cardiac defibrillator: Secondary | ICD-10-CM

## 2022-05-06 LAB — CUP PACEART REMOTE DEVICE CHECK
Battery Remaining Longevity: 5 mo
Battery Voltage: 2.81 V
Brady Statistic AP VP Percent: 98.32 %
Brady Statistic AP VS Percent: 1.55 %
Brady Statistic AS VP Percent: 0.1 %
Brady Statistic AS VS Percent: 0.03 %
Brady Statistic RA Percent Paced: 94.83 %
Brady Statistic RV Percent Paced: 42.52 %
Date Time Interrogation Session: 20230703093526
HighPow Impedance: 59 Ohm
Implantable Lead Implant Date: 20160615
Implantable Lead Implant Date: 20160615
Implantable Lead Implant Date: 20160615
Implantable Lead Location: 753858
Implantable Lead Location: 753859
Implantable Lead Location: 753860
Implantable Lead Model: 4398
Implantable Lead Model: 5076
Implantable Pulse Generator Implant Date: 20160615
Lead Channel Impedance Value: 1083 Ohm
Lead Channel Impedance Value: 1083 Ohm
Lead Channel Impedance Value: 1083 Ohm
Lead Channel Impedance Value: 399 Ohm
Lead Channel Impedance Value: 456 Ohm
Lead Channel Impedance Value: 456 Ohm
Lead Channel Impedance Value: 475 Ohm
Lead Channel Impedance Value: 475 Ohm
Lead Channel Impedance Value: 513 Ohm
Lead Channel Impedance Value: 513 Ohm
Lead Channel Impedance Value: 779 Ohm
Lead Channel Impedance Value: 779 Ohm
Lead Channel Impedance Value: 779 Ohm
Lead Channel Pacing Threshold Amplitude: 0.625 V
Lead Channel Pacing Threshold Amplitude: 0.75 V
Lead Channel Pacing Threshold Amplitude: 1.25 V
Lead Channel Pacing Threshold Pulse Width: 0.4 ms
Lead Channel Pacing Threshold Pulse Width: 0.4 ms
Lead Channel Pacing Threshold Pulse Width: 0.4 ms
Lead Channel Sensing Intrinsic Amplitude: 13.375 mV
Lead Channel Sensing Intrinsic Amplitude: 13.375 mV
Lead Channel Sensing Intrinsic Amplitude: 2.125 mV
Lead Channel Sensing Intrinsic Amplitude: 2.125 mV
Lead Channel Setting Pacing Amplitude: 1.5 V
Lead Channel Setting Pacing Amplitude: 2 V
Lead Channel Setting Pacing Amplitude: 2.25 V
Lead Channel Setting Pacing Pulse Width: 0.4 ms
Lead Channel Setting Pacing Pulse Width: 0.4 ms
Lead Channel Setting Sensing Sensitivity: 0.45 mV

## 2022-05-07 NOTE — Progress Notes (Signed)
EPIC Encounter for ICM Monitoring  Patient Name: Nicole Clements is a 81 y.o. female Date: 05/07/2022 Primary Care Physican: Lupita Raider, MD Primary Cardiologist: Wyline Mood Electrophysiologist: Joycelyn Schmid Pacing: 94.5%    01/03/2022 Weight: 138 lbs 02/03/2022 Weight: 138 lbs 05/07/2022 Weight: 138 lbs   Time in AT/AF  <0.1 hr/day (<0.1%)   Spoke with patient and heart failure questions reviewed.  Pt asymptomatic for fluid accumulation.  Reports feeling well at this time and voices no complaints.     Optivol thoracic impedance suggesting normal fluid levels.    Prescribed:   Furosemide 20 mg Take 20 mg daily. You may take an extra 20 mg daily as needed for swelling.   Potassium 20 mEq take 1 tablet daily Spironolactone 25 mg take 0.5 tablet (12.5 mg total) twice a day   Labs:  11/19/2021 Creatinine 1.60, BUN 18, Potassium 4.0, Sodium 132, GFR 33 10/23/2021 Creatinine 1.40, BUN 10, Potassium 4.3, Sodium 134, GFR 39  Care Everywhere office note 10/15/2021 Creatinine 2.46, BUN 43, Potassium 5.0, Sodium 127, GFR 20  Care Everywhere office note 08/01/2021 Creatinine 1.65, BUN 26, Potassium 4.7, Sodium 131, GFR 32 A complete set of results can be found in Results Review.   Recommendations:  No changes and encouraged to call if experiencing any fluid symptoms.   Follow-up plan: ICM clinic phone appointment on 06/09/2022.  91 day device clinic remote scheduled 06/30/2022.     EP/Cardiology Office Visits:  06/05/2022 with Dr Ladona Ridgel.   10/03/2022 with Dr Wyline Mood.     Copy of ICM check sent to Dr. Graciela Husbands.     3 month ICM trend: 05/05/2022.    12-14 Month ICM trend:     Karie Soda, RN 05/07/2022 11:47 AM

## 2022-05-19 ENCOUNTER — Other Ambulatory Visit: Payer: Self-pay | Admitting: Cardiology

## 2022-06-05 ENCOUNTER — Encounter: Payer: Self-pay | Admitting: Internal Medicine

## 2022-06-05 ENCOUNTER — Ambulatory Visit (INDEPENDENT_AMBULATORY_CARE_PROVIDER_SITE_OTHER): Payer: Medicare Other | Admitting: Internal Medicine

## 2022-06-05 VITALS — BP 136/88 | HR 98 | Ht 62.5 in | Wt 128.4 lb

## 2022-06-05 DIAGNOSIS — I255 Ischemic cardiomyopathy: Secondary | ICD-10-CM

## 2022-06-05 DIAGNOSIS — I5022 Chronic systolic (congestive) heart failure: Secondary | ICD-10-CM

## 2022-06-05 LAB — CUP PACEART INCLINIC DEVICE CHECK
Battery Remaining Longevity: 5 mo
Battery Voltage: 2.81 V
Brady Statistic AP VP Percent: 98.08 %
Brady Statistic AP VS Percent: 1.59 %
Brady Statistic AS VP Percent: 0.29 %
Brady Statistic AS VS Percent: 0.03 %
Brady Statistic RA Percent Paced: 93.55 %
Brady Statistic RV Percent Paced: 63.97 %
Date Time Interrogation Session: 20230803150654
HighPow Impedance: 60 Ohm
Implantable Lead Implant Date: 20160615
Implantable Lead Implant Date: 20160615
Implantable Lead Implant Date: 20160615
Implantable Lead Location: 753858
Implantable Lead Location: 753859
Implantable Lead Location: 753860
Implantable Lead Model: 4398
Implantable Lead Model: 5076
Implantable Pulse Generator Implant Date: 20160615
Lead Channel Impedance Value: 1064 Ohm
Lead Channel Impedance Value: 1121 Ohm
Lead Channel Impedance Value: 1140 Ohm
Lead Channel Impedance Value: 456 Ohm
Lead Channel Impedance Value: 456 Ohm
Lead Channel Impedance Value: 475 Ohm
Lead Channel Impedance Value: 513 Ohm
Lead Channel Impedance Value: 532 Ohm
Lead Channel Impedance Value: 551 Ohm
Lead Channel Impedance Value: 589 Ohm
Lead Channel Impedance Value: 722 Ohm
Lead Channel Impedance Value: 779 Ohm
Lead Channel Impedance Value: 836 Ohm
Lead Channel Pacing Threshold Amplitude: 0.625 V
Lead Channel Pacing Threshold Amplitude: 0.625 V
Lead Channel Pacing Threshold Amplitude: 1.125 V
Lead Channel Pacing Threshold Pulse Width: 0.4 ms
Lead Channel Pacing Threshold Pulse Width: 0.4 ms
Lead Channel Pacing Threshold Pulse Width: 0.4 ms
Lead Channel Sensing Intrinsic Amplitude: 18.375 mV
Lead Channel Sensing Intrinsic Amplitude: 3.25 mV
Lead Channel Setting Pacing Amplitude: 1.5 V
Lead Channel Setting Pacing Amplitude: 2 V
Lead Channel Setting Pacing Amplitude: 2.25 V
Lead Channel Setting Pacing Pulse Width: 0.4 ms
Lead Channel Setting Pacing Pulse Width: 0.4 ms
Lead Channel Setting Sensing Sensitivity: 0.45 mV

## 2022-06-05 NOTE — Patient Instructions (Signed)
Medication Instructions:  Your physician recommends that you continue on your current medications as directed. Please refer to the Current Medication list given to you today.  *If you need a refill on your cardiac medications before your next appointment, please call your pharmacy*   Lab Work: NONE   If you have labs (blood work) drawn today and your tests are completely normal, you will receive your results only by: MyChart Message (if you have MyChart) OR A paper copy in the mail If you have any lab test that is abnormal or we need to change your treatment, we will call you to review the results.   Testing/Procedures: NONE    Follow-Up: At [facility], you and your health needs are our priority.  As part of our continuing mission to provide you with exceptional heart care, we have created designated Provider Care Teams.  These Care Teams include your primary Cardiologist (physician) and Advanced Practice Providers (APPs -  Physician Assistants and Nurse Practitioners) who all work together to provide you with the care you need, when you need it.  We recommend signing up for the patient portal called "MyChart".  Sign up information is provided on this After Visit Summary.  MyChart is used to connect with patients for Virtual Visits (Telemedicine).  Patients are able to view lab/test results, encounter notes, upcoming appointments, etc.  Non-urgent messages can be sent to your provider as well.   To learn more about what you can do with MyChart, go to [email or web address].    Your next appointment:   1 year(s)  The format for your next appointment:   In Person  Provider:   Gregg Taylor, MD    Other Instructions Thank you for choosing [facility]!    Important Information About Sugar       

## 2022-06-05 NOTE — Progress Notes (Signed)
HPI Nicole Clements returns today for ongoing followup. She is a pleasant 81 yo woman with an ICM, chronic systolic CHF and LBBB, s/p biv ICD insertion. She was followed by Dr. Crissie Sickles but prefers to be seen in our [place] office as she lives here. She has not had any ICD shocks. No chest pain or sob. She is still smoking. She admits to being sedentary. Allergies  Allergen Reactions   Lactose Intolerance (Gi) Other (See Comments)    GI Upset    Sulfa Antibiotics Rash     Current Outpatient Medications  Medication Sig Dispense Refill   acetaminophen (TYLENOL) 500 MG tablet Take 1,000 mg by mouth every 8 (eight) hours as needed for mild pain or headache.     aspirin EC 81 MG tablet Take 1 tablet (81 mg total) by mouth daily. 90 tablet 3   carvedilol (COREG) 25 MG tablet TAKE 1 TABLET BY MOUTH TWICE DAILY 60 tablet 6   Cholecalciferol (VITAMIN D3) 2000 UNITS capsule Take 2,000 Units by mouth daily.     furosemide (LASIX) 20 MG tablet TAKE 1 TABLET BY MOUTH EVERY DAY. YOU MAKE TAKE AN 20 MG EVERY DAY AS NEEDED FOR SWELLING. 180 tablet 0   losartan (COZAAR) 25 MG tablet TAKE 1 TABLET(25 MG) BY MOUTH DAILY 90 tablet 3   Magnesium Oxide 400 MG CAPS Take 400mg  by mouth twice a day x 5 days, then 400mg  by mouth daily thereafter.     nitroGLYCERIN (NITROSTAT) 0.4 MG SL tablet Place 1 tablet (0.4 mg total) under the tongue every 5 (five) minutes x 3 doses as needed for chest pain. 25 tablet 2   Omega-3 Fatty Acids (FISH OIL) 1200 MG CAPS Take 1,200 mg by mouth daily.     potassium chloride SA (KLOR-CON M) 20 MEQ tablet TAKE 1 TABLET(20 MEQ) BY MOUTH DAILY 90 tablet 2   pravastatin (PRAVACHOL) 80 MG tablet Take 80 mg by mouth daily.     spironolactone (ALDACTONE) 25 MG tablet TAKE 1/2 TABLET(12.5 MG) BY MOUTH TWICE DAILY 90 tablet 3   No current facility-administered medications for this visit.     Past Medical History:  Diagnosis Date   AICD (automatic cardioverter/defibrillator) present     CAD (coronary artery disease) April 2014   DES to PLA, occluded PDA 07/2014   CHF (congestive heart failure) (HCC)    Hypercholesterolemia    Hypertension    Ischemic cardiomyopathy    LVEF 25%-45%   IVCD (intraventricular conduction defect)    PVC's (premature ventricular contractions)    S/P colonoscopy August 2007   Hyperplastic polyps, rare sigmoid and descending colon diverticulosis, small internal hemorrhoids   STEMI (ST elevation myocardial infarction) (HCC) 07/22/1996    ROS:   All systems reviewed and negative except as noted in the HPI.   Past Surgical History:  Procedure Laterality Date   ABDOMINAL HYSTERECTOMY     CARDIAC CATHETERIZATION  April 2014   Med Rx   CATARACT EXTRACTION W/PHACO Right 09/15/2017   Procedure: CATARACT EXTRACTION PHACO AND INTRAOCULAR LENS PLACEMENT (IOC);  Surgeon: May 2014, MD;  Location: [facility];  Service: Ophthalmology;  Laterality: Right;  CDE: 12.22   CATARACT EXTRACTION W/PHACO Left 09/29/2017   Procedure: CATARACT EXTRACTION PHACO AND INTRAOCULAR LENS PLACEMENT (IOC);  Surgeon: 03[birth year]26[birth year]1975, MD;  Location: [facility];  Service: Ophthalmology;  Laterality: Left;  CDE: 10.02   COLONOSCOPY N/A 12/24/2015   Dr. Fields:moderate sized internal hemorrhoids/moderate diverticulosis, negative microscopic colitis  EP IMPLANTABLE DEVICE  04/18/2015   BV ICD   EP IMPLANTABLE DEVICE N/A 04/18/2015   Procedure: BiV ICD Insertion CRT-D;  Surgeon: Duke Salvia, MD;  Location: [facility] INVASIVE CV LAB;  Service: Cardiovascular;  Laterality: N/A;   LEFT AND RIGHT HEART CATHETERIZATION WITH CORONARY ANGIOGRAM N/A 07/17/2014   Procedure: LEFT AND RIGHT HEART CATHETERIZATION WITH CORONARY ANGIOGRAM;  Surgeon: Micheline Chapman, MD;  Location: [facility] CATH LAB;  Service: Cardiovascular;  Laterality: N/A;   PERCUTANEOUS CORONARY STENT INTERVENTION (PCI-S)  07/17/2014   Procedure: PERCUTANEOUS CORONARY STENT INTERVENTION (PCI-S);  Surgeon: Micheline Chapman, MD;  Location:  [facility] CATH LAB;  Service: Cardiovascular;;  Distal RCA   S/P Hysterectomy       Family History  Problem Relation Age of Onset   Hypertension Sister    Diabetes Mellitus II Sister    Hypertension Brother    Diabetes Mellitus II Brother    Hypertension Brother    Diabetes Mellitus II Brother    Hypertension Brother    Diabetes Mellitus II Brother    Colon cancer Neg Hx      Social History   Socioeconomic History   Marital status: Single    Spouse name: Not on file   Number of children: 1   Years of education: Not on file   Highest education level: Not on file  Occupational History   Occupation: Retired    Comment: Clinical biochemist rep  Tobacco Use   Smoking status: Every Day    Packs/day: 0.50    Years: 50.00    Total pack years: 25.00    Types: Cigarettes    Start date: 02/26/1962   Smokeless tobacco: Never   Tobacco comments:    down to less than 1/2 ppd  Vaping Use   Vaping Use: Never used  Substance and Sexual Activity   Alcohol use: No    Alcohol/week: 0.0 standard drinks of alcohol   Drug use: No   Sexual activity: Never    Birth control/protection: None  Other Topics Concern   Not on file  Social History Narrative   Not on file   Social Determinants of Health   Financial Resource Strain: Not on file  Food Insecurity: Not on file  Transportation Needs: Not on file  Physical Activity: Not on file  Stress: Not on file  Social Connections: Not on file  Intimate Partner Violence: Not on file     BP 136/88   Pulse 98   Ht 5' 2.5" (1.588 m)   Wt 128 lb 6.4 oz (58.2 kg)   SpO2 96%   BMI 23.11 kg/m   Physical Exam:  Well appearing NAD HEENT: Unremarkable Neck:  No JVD, no thyromegally Lymphatics:  No adenopathy Back:  No CVA tenderness Lungs:  Clear with no wheezes HEART:  Regular rate rhythm, no murmurs, no rubs, no clicks Abd:  soft, positive bowel sounds, no organomegally, no rebound, no guarding Ext:  2 plus pulses, no edema, no cyanosis,  no clubbing Skin:  No rashes no nodules Neuro:  CN II through XII intact, motor grossly intact  DEVICE  Normal device function.  See PaceArt for details.   Assess/Plan:   1. Chronic systolic heart failure - her symptoms are class 2. No change in her meds today. She will continue a low sodium diet. 2. ICD - her Medtronic BiV ICD is working normally though she is approaching ERI. 3. CAD - she denies anginal symptoms.  4. Tobacco abuse - she  is encouraged to stop smoking.   Nicole Overlie Chriss Redel,MD

## 2022-06-09 ENCOUNTER — Ambulatory Visit (INDEPENDENT_AMBULATORY_CARE_PROVIDER_SITE_OTHER): Payer: Medicare Other

## 2022-06-09 DIAGNOSIS — Z95810 Presence of automatic (implantable) cardiac defibrillator: Secondary | ICD-10-CM | POA: Diagnosis not present

## 2022-06-09 DIAGNOSIS — I5022 Chronic systolic (congestive) heart failure: Secondary | ICD-10-CM

## 2022-06-13 NOTE — Progress Notes (Signed)
EPIC Encounter for ICM Monitoring  Patient Name: Nicole Clements is a 81 y.o. female Date: 06/13/2022 Primary Care Physican: Lupita Raider, MD Primary Cardiologist: Wyline Mood Electrophysiologist: Joycelyn Schmid Pacing: 92.9%    06/13/2022 Weight: 138 lbs   Time in AT/AF  <0.1 hr/day (<0.1%)   Spoke with patient and heart failure questions reviewed.  Pt asymptomatic for fluid accumulation.  Reports feeling well at this time and voices no complaints.     Optivol thoracic impedance suggesting normal fluid levels.    Prescribed:   Furosemide 20 mg Take 20 mg daily. You may take an extra 20 mg daily as needed for swelling.   Potassium 20 mEq take 1 tablet daily Spironolactone 25 mg take 0.5 tablet (12.5 mg total) twice a day   Labs:  11/19/2021 Creatinine 1.60, BUN 18, Potassium 4.0, Sodium 132, GFR 33 10/23/2021 Creatinine 1.40, BUN 10, Potassium 4.3, Sodium 134, GFR 39  Care Everywhere office note 10/15/2021 Creatinine 2.46, BUN 43, Potassium 5.0, Sodium 127, GFR 20  Care Everywhere office note 08/01/2021 Creatinine 1.65, BUN 26, Potassium 4.7, Sodium 131, GFR 32 A complete set of results can be found in Results Review.   Recommendations:  No changes and encouraged to call if experiencing any fluid symptoms.   Follow-up plan: ICM clinic phone appointment on 07/14/2022.  91 day device clinic remote scheduled 06/30/2022.     EP/Cardiology Office Visits:  06/05/2022 with Dr Ladona Ridgel.   10/03/2022 with Dr Wyline Mood.     Copy of ICM check sent to Dr. Graciela Husbands.      3 month ICM trend: 06/12/2022.    12-14 Month ICM trend:     Karie Soda, RN 06/13/2022 12:39 PM

## 2022-06-30 ENCOUNTER — Ambulatory Visit (INDEPENDENT_AMBULATORY_CARE_PROVIDER_SITE_OTHER): Payer: Medicare Other

## 2022-06-30 DIAGNOSIS — I5022 Chronic systolic (congestive) heart failure: Secondary | ICD-10-CM

## 2022-07-01 LAB — CUP PACEART REMOTE DEVICE CHECK
Battery Remaining Longevity: 4 mo
Battery Voltage: 2.79 V
Brady Statistic AP VP Percent: 97.07 %
Brady Statistic AP VS Percent: 2 %
Brady Statistic AS VP Percent: 0.92 %
Brady Statistic AS VS Percent: 0.01 %
Brady Statistic RA Percent Paced: 90.05 %
Brady Statistic RV Percent Paced: 77.64 %
Date Time Interrogation Session: 20230828091606
HighPow Impedance: 65 Ohm
Implantable Lead Implant Date: 20160615
Implantable Lead Implant Date: 20160615
Implantable Lead Implant Date: 20160615
Implantable Lead Location: 753858
Implantable Lead Location: 753859
Implantable Lead Location: 753860
Implantable Lead Model: 4398
Implantable Lead Model: 5076
Implantable Pulse Generator Implant Date: 20160615
Lead Channel Impedance Value: 1083 Ohm
Lead Channel Impedance Value: 1083 Ohm
Lead Channel Impedance Value: 1140 Ohm
Lead Channel Impedance Value: 399 Ohm
Lead Channel Impedance Value: 399 Ohm
Lead Channel Impedance Value: 456 Ohm
Lead Channel Impedance Value: 456 Ohm
Lead Channel Impedance Value: 513 Ohm
Lead Channel Impedance Value: 513 Ohm
Lead Channel Impedance Value: 532 Ohm
Lead Channel Impedance Value: 779 Ohm
Lead Channel Impedance Value: 779 Ohm
Lead Channel Impedance Value: 874 Ohm
Lead Channel Pacing Threshold Amplitude: 0.625 V
Lead Channel Pacing Threshold Amplitude: 0.625 V
Lead Channel Pacing Threshold Amplitude: 1.25 V
Lead Channel Pacing Threshold Pulse Width: 0.4 ms
Lead Channel Pacing Threshold Pulse Width: 0.4 ms
Lead Channel Pacing Threshold Pulse Width: 0.4 ms
Lead Channel Sensing Intrinsic Amplitude: 1.625 mV
Lead Channel Sensing Intrinsic Amplitude: 1.625 mV
Lead Channel Sensing Intrinsic Amplitude: 12.5 mV
Lead Channel Sensing Intrinsic Amplitude: 12.5 mV
Lead Channel Setting Pacing Amplitude: 1.5 V
Lead Channel Setting Pacing Amplitude: 2 V
Lead Channel Setting Pacing Amplitude: 2.25 V
Lead Channel Setting Pacing Pulse Width: 0.4 ms
Lead Channel Setting Pacing Pulse Width: 0.4 ms
Lead Channel Setting Sensing Sensitivity: 0.45 mV

## 2022-07-16 NOTE — Progress Notes (Signed)
No ICM remote transmission received for 07/14/2022 and next ICM transmission scheduled for 08/04/2022.   

## 2022-07-24 ENCOUNTER — Other Ambulatory Visit (HOSPITAL_COMMUNITY): Payer: Self-pay | Admitting: Family Medicine

## 2022-07-24 DIAGNOSIS — Z1231 Encounter for screening mammogram for malignant neoplasm of breast: Secondary | ICD-10-CM

## 2022-07-24 NOTE — Progress Notes (Signed)
Remote ICD transmission.   

## 2022-07-28 ENCOUNTER — Other Ambulatory Visit: Payer: Self-pay | Admitting: Cardiology

## 2022-07-31 DIAGNOSIS — Z23 Encounter for immunization: Secondary | ICD-10-CM | POA: Diagnosis not present

## 2022-08-05 ENCOUNTER — Telehealth: Payer: Self-pay

## 2022-08-05 NOTE — Telephone Encounter (Signed)
Spoke with patient.  Advised home monitor is showing disconnected.  She stated it is still plugged in and not sure why it is not working.  Provided [employer] tech support and advised to call for assistance.

## 2022-08-08 NOTE — Telephone Encounter (Signed)
No ICM remote transmission received for 08/04/2022 and next ICM transmission scheduled for 09/15/2022.    

## 2022-08-08 NOTE — Progress Notes (Deleted)
No ICM remote transmission received for 08/04/2022 and next ICM transmission scheduled for 09/15/2022.    

## 2022-08-11 ENCOUNTER — Ambulatory Visit (HOSPITAL_COMMUNITY)
Admission: RE | Admit: 2022-08-11 | Discharge: 2022-08-11 | Disposition: A | Discharge status: Home / Self Care | Payer: Medicare Other | Source: Ambulatory Visit | Attending: Family Medicine | Admitting: Family Medicine

## 2022-08-11 DIAGNOSIS — Z1231 Encounter for screening mammogram for malignant neoplasm of breast: Secondary | ICD-10-CM | POA: Insufficient documentation

## 2022-08-21 DIAGNOSIS — Z23 Encounter for immunization: Secondary | ICD-10-CM | POA: Diagnosis not present

## 2022-09-15 ENCOUNTER — Ambulatory Visit (INDEPENDENT_AMBULATORY_CARE_PROVIDER_SITE_OTHER): Payer: Medicare Other

## 2022-09-15 DIAGNOSIS — Z95810 Presence of automatic (implantable) cardiac defibrillator: Secondary | ICD-10-CM | POA: Diagnosis not present

## 2022-09-15 DIAGNOSIS — I5022 Chronic systolic (congestive) heart failure: Secondary | ICD-10-CM

## 2022-09-17 ENCOUNTER — Other Ambulatory Visit: Payer: Self-pay | Admitting: Cardiology

## 2022-09-17 NOTE — Progress Notes (Signed)
EPIC Encounter for ICM Monitoring  Patient Name: Nicole Clements is a 81 y.o. female Date: 09/17/2022 Primary Care Physican: Lupita Raider, MD Primary Cardiologist: Wyline Mood Electrophysiologist: Joycelyn Schmid Pacing: 93.4%    06/13/2022 Weight: 138 lbs 09/17/2022 Weight: 138 lbs   Time in AT/AF  <0.1 hr/day (<0.1%)   Spoke with patient and heart failure questions reviewed.  Pt asymptomatic for fluid accumulation.  Reports feeling well at this time and voices no complaints.     Optivol thoracic impedance suggesting possible fluid accumulation starting 10/25.    Prescribed:   Furosemide 20 mg Take 20 mg daily. You may take an extra 20 mg daily as needed for swelling.   Potassium 20 mEq take 1 tablet daily Spironolactone 25 mg take 0.5 tablet (12.5 mg total) twice a day   Labs:  11/19/2021 Creatinine 1.60, BUN 18, Potassium 4.0, Sodium 132, GFR 33 10/23/2021 Creatinine 1.40, BUN 10, Potassium 4.3, Sodium 134, GFR 39  Care Everywhere office note 10/15/2021 Creatinine 2.46, BUN 43, Potassium 5.0, Sodium 127, GFR 20  Care Everywhere office note 08/01/2021 Creatinine 1.65, BUN 26, Potassium 4.7, Sodium 131, GFR 32 A complete set of results can be found in Results Review.   Recommendations:   Advised to take Furosemide 2 tablets daily x 2 days only then return to 1 tablet daily.  She verbalized understanding and agreed.     Follow-up plan: ICM clinic phone appointment on 09/22/2022.  91 day device clinic remote scheduled 09/29/2022.     EP/Cardiology Office Visits:    10/03/2022 with Dr Wyline Mood.     Copy of ICM check sent to Dr. Graciela Husbands and Dr Wyline Mood as Lorain Childes.     3 month ICM trend: 09/15/2022.    12-14 Month ICM trend:     Karie Soda, RN 09/17/2022 12:41 PM

## 2022-09-22 ENCOUNTER — Ambulatory Visit (INDEPENDENT_AMBULATORY_CARE_PROVIDER_SITE_OTHER): Payer: Medicare Other

## 2022-09-22 DIAGNOSIS — Z95810 Presence of automatic (implantable) cardiac defibrillator: Secondary | ICD-10-CM

## 2022-09-22 DIAGNOSIS — I5022 Chronic systolic (congestive) heart failure: Secondary | ICD-10-CM

## 2022-09-23 NOTE — Progress Notes (Signed)
EPIC Encounter for ICM Monitoring  Patient Name: Nicole Clements is a 81 y.o. female Date: 09/23/2022 Primary Care Physican: Lupita Raider, MD Primary Cardiologist: Wyline Mood Electrophysiologist: Joycelyn Schmid Pacing: 96%    06/13/2022 Weight: 138 lbs 09/17/2022 Weight: 138 lbs   Time in AT/AF  <0.1 hr/day (<0.1%)  Battery Longevity: 2 months   Spoke with patient and heart failure questions reviewed.  Pt asymptomatic for fluid accumulation.  Reports feeling well at this time and voices no complaints.     Optivol thoracic impedance suggesting fluid levels returned close to normal after taking extra 20 mg Lasix x 2 days.    Prescribed:   Furosemide 20 mg Take 20 mg daily. You may take an extra 20 mg daily as needed for swelling.   Potassium 20 mEq take 1 tablet daily Spironolactone 25 mg take 0.5 tablet (12.5 mg total) twice a day   Labs:  11/19/2021 Creatinine 1.60, BUN 18, Potassium 4.0, Sodium 132, GFR 33 10/23/2021 Creatinine 1.40, BUN 10, Potassium 4.3, Sodium 134, GFR 39  Care Everywhere office note 10/15/2021 Creatinine 2.46, BUN 43, Potassium 5.0, Sodium 127, GFR 20  Care Everywhere office note 08/01/2021 Creatinine 1.65, BUN 26, Potassium 4.7, Sodium 131, GFR 32 A complete set of results can be found in Results Review.   Recommendations:  Advised to take extra Furosemide after Thanksgiving if needed.     Follow-up plan: ICM clinic phone appointment on 10/20/2022.  91 day device clinic remote scheduled 12/29/2021.     EP/Cardiology Office Visits:    10/03/2022 with Dr Wyline Mood.     Copy of ICM check sent to Dr. Graciela Husbands.      3 month ICM trend: 09/22/2022.    12-14 Month ICM trend:     Karie Soda, RN 09/23/2022 12:27 PM

## 2022-09-29 ENCOUNTER — Ambulatory Visit (INDEPENDENT_AMBULATORY_CARE_PROVIDER_SITE_OTHER): Payer: Medicare Other

## 2022-09-29 DIAGNOSIS — I5022 Chronic systolic (congestive) heart failure: Secondary | ICD-10-CM

## 2022-09-30 LAB — CUP PACEART REMOTE DEVICE CHECK
Battery Remaining Longevity: 2 mo
Battery Voltage: 2.75 V
Brady Statistic AP VP Percent: 98.09 %
Brady Statistic AP VS Percent: 1.53 %
Brady Statistic AS VP Percent: 0.36 %
Brady Statistic AS VS Percent: 0.01 %
Brady Statistic RA Percent Paced: 97.83 %
Brady Statistic RV Percent Paced: 53.32 %
Date Time Interrogation Session: 20231127022504
HighPow Impedance: 54 Ohm
Implantable Lead Connection Status: 753985
Implantable Lead Connection Status: 753985
Implantable Lead Connection Status: 753985
Implantable Lead Implant Date: 20160615
Implantable Lead Implant Date: 20160615
Implantable Lead Implant Date: 20160615
Implantable Lead Location: 753858
Implantable Lead Location: 753859
Implantable Lead Location: 753860
Implantable Lead Model: 4398
Implantable Lead Model: 5076
Implantable Pulse Generator Implant Date: 20160615
Lead Channel Impedance Value: 1007 Ohm
Lead Channel Impedance Value: 1007 Ohm
Lead Channel Impedance Value: 361 Ohm
Lead Channel Impedance Value: 361 Ohm
Lead Channel Impedance Value: 399 Ohm
Lead Channel Impedance Value: 418 Ohm
Lead Channel Impedance Value: 456 Ohm
Lead Channel Impedance Value: 475 Ohm
Lead Channel Impedance Value: 532 Ohm
Lead Channel Impedance Value: 646 Ohm
Lead Channel Impedance Value: 646 Ohm
Lead Channel Impedance Value: 703 Ohm
Lead Channel Impedance Value: 950 Ohm
Lead Channel Pacing Threshold Amplitude: 0.625 V
Lead Channel Pacing Threshold Amplitude: 0.625 V
Lead Channel Pacing Threshold Amplitude: 1 V
Lead Channel Pacing Threshold Pulse Width: 0.4 ms
Lead Channel Pacing Threshold Pulse Width: 0.4 ms
Lead Channel Pacing Threshold Pulse Width: 0.4 ms
Lead Channel Sensing Intrinsic Amplitude: 1.625 mV
Lead Channel Sensing Intrinsic Amplitude: 1.625 mV
Lead Channel Sensing Intrinsic Amplitude: 15 mV
Lead Channel Sensing Intrinsic Amplitude: 15 mV
Lead Channel Setting Pacing Amplitude: 1.5 V
Lead Channel Setting Pacing Amplitude: 2 V
Lead Channel Setting Pacing Amplitude: 2 V
Lead Channel Setting Pacing Pulse Width: 0.4 ms
Lead Channel Setting Pacing Pulse Width: 0.4 ms
Lead Channel Setting Sensing Sensitivity: 0.45 mV
Zone Setting Status: 755011
Zone Setting Status: 755011

## 2022-10-03 ENCOUNTER — Ambulatory Visit: Payer: Medicare Other | Attending: Cardiology | Admitting: Cardiology

## 2022-10-03 ENCOUNTER — Encounter: Payer: Self-pay | Admitting: Cardiology

## 2022-10-03 ENCOUNTER — Encounter: Payer: Self-pay | Admitting: *Deleted

## 2022-10-03 VITALS — BP 102/58 | HR 84 | Ht 62.5 in | Wt 133.8 lb

## 2022-10-03 DIAGNOSIS — I255 Ischemic cardiomyopathy: Secondary | ICD-10-CM | POA: Diagnosis not present

## 2022-10-03 DIAGNOSIS — I5022 Chronic systolic (congestive) heart failure: Secondary | ICD-10-CM

## 2022-10-03 DIAGNOSIS — N179 Acute kidney failure, unspecified: Secondary | ICD-10-CM

## 2022-10-03 DIAGNOSIS — N1832 Chronic kidney disease, stage 3b: Secondary | ICD-10-CM | POA: Diagnosis not present

## 2022-10-03 DIAGNOSIS — I2510 Atherosclerotic heart disease of native coronary artery without angina pectoris: Secondary | ICD-10-CM | POA: Diagnosis not present

## 2022-10-03 DIAGNOSIS — E782 Mixed hyperlipidemia: Secondary | ICD-10-CM

## 2022-10-03 NOTE — Patient Instructions (Addendum)
Medication Instructions:  Continue all current medications.   Labwork: none  Testing/Procedures: none  Follow-Up: 6 months   Any Other Special Instructions Will Be Listed Below (If Applicable).   If you need a refill on your cardiac medications before your next appointment, please call your pharmacy.  

## 2022-10-03 NOTE — Progress Notes (Signed)
Clinical Summary [name] is a 81 y.o.female seen today for follow up of the following medical problems.    1. 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 Admit 07/2014 with CHF and troponin elevation - cath 07/2014, LM 30% distal, LAD mid 50%, LCX ostial 50% and mid 50%, small intermediate Cicely Ortner 70%. RCA diffuse 50%, distal RCA with ulcerated 90% lesion, PDA 80-90% too small for PCI. LVEF 30%. Received DES to distal RCA, post procedure the PDA became occluded.   - echo 07/2014 LVEF 20%, inferior akinesis, grade II diastolic dysfunction. Repeat echo 10/2014 LVEF remains 15-20%.   - 04/2015 CRT-D device placed by Dr Caryl Comes. Normal function Jan 2018.  10/2015 echo LVEF 25-30%  - could not afford entresto, back on losartan.     04/2021 echo LVEF 25-30%       - no SOB/DOE, no LE edema. - no chest pains - compliant with meds - 09/29/22 normal device function. Optivol was elevated, took some extra lasix.   -home weight 135 at home which is stable.     2. Hyperlipidemia  - atorva was too expensive, changed to pravastatin by pcp   10/2020 TC 104 TG 73 HDL 40 LDL 64   -upcoming labs with pcp -    3. CKD III - followed by pcp - Jan 2023 Cr 1.6     Past Medical History:  Diagnosis Date   AICD (automatic cardioverter/defibrillator) present    CAD (coronary artery disease) April 2014   DES to PLA, occluded PDA 07/2014   CHF (congestive heart failure) (HCC)    Hypercholesterolemia    Hypertension    Ischemic cardiomyopathy    LVEF 25%-45%   IVCD (intraventricular conduction defect)    PVC's (premature ventricular contractions)    S/P colonoscopy August 2007   Hyperplastic polyps, rare sigmoid and descending colon diverticulosis, small internal hemorrhoids   STEMI (ST elevation myocardial infarction) ([place]) 07/22/1996     Allergies  Allergen Reactions   Lactose Intolerance (Gi) Other (See Comments)    GI Upset    Sulfa Antibiotics Rash     Current  Outpatient Medications  Medication Sig Dispense Refill   acetaminophen (TYLENOL) 500 MG tablet Take 1,000 mg by mouth every 8 (eight) hours as needed for mild pain or headache.     aspirin EC 81 MG tablet Take 1 tablet (81 mg total) by mouth daily. 90 tablet 3   carvedilol (COREG) 25 MG tablet TAKE 1 TABLET BY MOUTH TWICE DAILY 60 tablet 6   Cholecalciferol (VITAMIN D3) 2000 UNITS capsule Take 2,000 Units by mouth daily.     furosemide (LASIX) 20 MG tablet TAKE 1 TABLET BY MOUTH EVERY DAY. MAY TAKE AN ADDITIONAL TABLET EVERY DAY AS NEEDED FOR SWELLING. 180 tablet 1   losartan (COZAAR) 25 MG tablet TAKE 1 TABLET(25 MG) BY MOUTH DAILY 90 tablet 3   Magnesium Oxide 400 MG CAPS Take 472m by mouth twice a day x 5 days, then 401mby mouth daily thereafter.     nitroGLYCERIN (NITROSTAT) 0.4 MG SL tablet Place 1 tablet (0.4 mg total) under the tongue every 5 (five) minutes x 3 doses as needed for chest pain. 25 tablet 2   Omega-3 Fatty Acids (FISH OIL) 1200 MG CAPS Take 1,200 mg by mouth daily.     potassium chloride SA (KLOR-CON M) 20 MEQ tablet TAKE 1 TABLET(20 MEQ) BY MOUTH DAILY 90 tablet 2   pravastatin (PRAVACHOL) 80 MG tablet  Take 80 mg by mouth daily.     spironolactone (ALDACTONE) 25 MG tablet TAKE 1/2 TABLET(12.5 MG) BY MOUTH TWICE DAILY 90 tablet 3   No current facility-administered medications for this visit.     Past Surgical History:  Procedure Laterality Date   ABDOMINAL HYSTERECTOMY     CARDIAC CATHETERIZATION  April 2014   Med Rx   CATARACT EXTRACTION W/PHACO Right 09/15/2017   Procedure: CATARACT EXTRACTION PHACO AND INTRAOCULAR LENS PLACEMENT ([place]);  Surgeon: Rutherford Guys, MD;  Location: [facility];  Service: Ophthalmology;  Laterality: Right;  CDE: 12.22   CATARACT EXTRACTION W/PHACO Left 09/29/2017   Procedure: CATARACT EXTRACTION PHACO AND INTRAOCULAR LENS PLACEMENT ([facility]);  Surgeon: Rutherford Guys, MD;  Location: [facility];  Service: Ophthalmology;  Laterality: Left;  CDE: 10.02    COLONOSCOPY N/A 12/24/2015   Dr. Fields:moderate sized internal hemorrhoids/moderate diverticulosis, negative microscopic colitis    EP IMPLANTABLE DEVICE  04/18/2015   BV ICD   EP IMPLANTABLE DEVICE N/A 04/18/2015   Procedure: BiV ICD Insertion CRT-D;  Surgeon: Deboraha Sprang, MD;  Location: [facility];  Service: Cardiovascular;  Laterality: N/A;   LEFT AND RIGHT HEART CATHETERIZATION WITH CORONARY ANGIOGRAM N/A 07/17/2014   Procedure: LEFT AND RIGHT HEART CATHETERIZATION WITH CORONARY ANGIOGRAM;  Surgeon: Blane Ohara, MD;  Location: [facility] CATH LAB;  Service: Cardiovascular;  Laterality: N/A;   PERCUTANEOUS CORONARY STENT INTERVENTION (PCI-S)  07/17/2014   Procedure: PERCUTANEOUS CORONARY STENT INTERVENTION (PCI-S);  Surgeon: Blane Ohara, MD;  Location: [facility] CATH LAB;  Service: Cardiovascular;;  Distal RCA   S/P Hysterectomy       Allergies  Allergen Reactions   Lactose Intolerance (Gi) Other (See Comments)    GI Upset    Sulfa Antibiotics Rash      Family History  Problem Relation Age of Onset   Hypertension Sister    Diabetes Mellitus II Sister    Hypertension Brother    Diabetes Mellitus II Brother    Hypertension Brother    Diabetes Mellitus II Brother    Hypertension Brother    Diabetes Mellitus II Brother    Colon cancer Neg Hx      Social History Ms. Kemmer reports that she has been smoking cigarettes. She started smoking about 60 years ago. She has a 25.00 pack-year smoking history. She has never used smokeless tobacco. Ms. Solar reports no history of alcohol use.   Review of Systems CONSTITUTIONAL: No weight loss, fever, chills, weakness or fatigue.  HEENT: Eyes: No visual loss, blurred vision, double vision or yellow sclerae.No hearing loss, sneezing, congestion, runny nose or sore throat.  SKIN: No rash or itching.  CARDIOVASCULAR: per hpi RESPIRATORY: No shortness of breath, cough or sputum.  GASTROINTESTINAL: No anorexia, nausea, vomiting or  diarrhea. No abdominal pain or blood.  GENITOURINARY: No burning on urination, no polyuria NEUROLOGICAL: No headache, dizziness, syncope, paralysis, ataxia, numbness or tingling in the extremities. No change in bowel or bladder control.  MUSCULOSKELETAL: No muscle, back pain, joint pain or stiffness.  LYMPHATICS: No enlarged nodes. No history of splenectomy.  PSYCHIATRIC: No history of depression or anxiety.  ENDOCRINOLOGIC: No reports of sweating, cold or heat intolerance. No polyuria or polydipsia.  [name]   Physical Examination Today's Vitals   10/03/22 1249  BP: (!) 102/58  Pulse: 84  SpO2: 99%  Weight: 133 lb 12.8 oz (60.7 kg)  Height: 5' 2.5" (1.588 m)   Body mass index is 24.08 kg/m.  Gen: resting comfortably, no  acute distress HEENT: no scleral icterus, pupils equal round and reactive, no palptable cervical adenopathy,  CV: RRR, no m/r/g, no jvd Resp: Clear to auscultation bilaterally GI: abdomen is soft, non-tender, non-distended, normal bowel sounds, no hepatosplenomegaly MSK: extremities are warm, no edema.  Skin: warm, no rash Neuro:  no focal deficits Psych: appropriate affect   Diagnostic Studies   02/2013 Cath   Procedural Findings:   Hemodynamics:   AO 116/45 with a mean of 70   LV 120/18   Coronary angiography:   Coronary dominance: right   Left mainstem: The left main is patent with 30% distal left main stenosis as the vessel trifurcates into the LAD, intermediate Advik Weatherspoon, and left circumflex.   Left anterior descending (LAD): The LAD is patent with diffuse disease noted. There is minimal calcification present. The first diagonal is patent with 30-50% diffuse disease. The LAD after the first septal perforator has 40-50% stenosis. The vessel reaches the apex and wraps around the left ventricular apex without significant stenosis.   Left circumflex (LCx): The intermediate Abdiel Blackerby is small in caliber and diffusely diseased with 50-60% proximal vessel stenosis the  AV groove circumflex is diffusely diseased with 50% ostial stenosis and 30% mid stenosis it supplies 2 small posterolateral Arline Ketter   Right coronary artery (RCA): The RCA is diffusely diseased. There is significant tortuosity, especially around the junction of the mid and distal vessel. The vessel is diffusely calcified. The mid vessel has tandem 50% stenoses. The distal vessel has 80-90 % stenosis just before the bifurcation of the PDA and posterior AV segment. The PDA and posterior AV segment with 3 posterolateral branches are patent.   Left ventriculography: Left ventricular systolic function is moderately depressed. There is severe hypokinesis of the basal and midinferior walls. The anterolateral and apical walls contract normally. The left ventricular ejection fraction is estimated at 45%.   Abdominal aortogram: The abdominal aorta is patent. The renal arteries are single bilaterally and they are widely patent. There appears to be nonobstructive stenosis of the right common iliac artery at the aortoiliac junction. The left common iliac artery appears to have significant ostial stenosis estimated at at least 75% angiographically. The visualized portions of the external iliac and common femoral arteries are patent   Final Conclusions:  1. Severe single-vessel coronary artery disease involving the distal right coronary artery   2. Moderate LV dysfunction with segmental severe hypokinesis of the inferior wall and normal contraction of the antero-apex with estimated left ventricular ejection fraction of 45%.   3. Severe left common iliac artery stenosis   Recommendations: The patient has severe distal RCA disease. She has a known history of inferior wall infarction and remote PCA. This fits with that clinical history. She has nonobstructive disease of the left coronary artery. Her left ventricular ejection fraction by ventriculography is clearly greater than 35%. With her history of previous MI, I do not  think PCI is indicated with an absence of angina. Will carefully discussed symptoms with the patient, but I am inclined to treat her medically. Will also review indication for consideration of peripheral intervention for her iliac disease.       01/2013 Echo   Study Conclusions  - Left ventricle: The cavity size was mildly to moderately dilated. Wall thickness was normal. Systolic function was severely reduced. The estimated ejection fraction was 25%. Severe diffuse hypokinesis. - Aortic valve: Mildly calcified annulus. Trileaflet. - Mitral valve: Calcified annulus. - Left atrium: The atrium was moderately dilated. - Right  ventricle: The cavity size was normal. Wall thickness was mildly to moderately increased. - Right atrium: The atrium was mildly dilated. - Atrial septum: No defect or patent foramen ovale was identified. Impressions:  - Compared to the prior study of 03/13/11, there has been substantial deterioration in LV systolic function.   07/2014 Echo Study Conclusions  - Left ventricle: The cavity size was severely dilated. Wall thickness was normal. The estimated ejection fraction was 20%. Diffuse hypokinesis. There is akinesis of the inferolateral and inferior myocardium. Features are consistent with a pseudonormal left ventricular filling pattern, with concomitant abnormal relaxation and increased filling pressure (grade 2 diastolic dysfunction). Doppler parameters are consistent with high ventricular filling pressure. - Mitral valve: There was mild to moderate regurgitation. - Left atrium: The atrium was moderately dilated.  Impressions:  - Global hypokinesis; inferior and inferolateral akinesis; overall severely reduced LV function; grade 2 diastolic dysfunction with elevated left ventricular filling pressures, moderate LAE; mild to moderate MR.  07/2014 Cath PROCEDURAL FINDINGS   Hemodynamics:   AO 122/48   LV 118/14   RA 9   RV 41/11   PA 37/5 with a mean  of 21   PCWP 27   Oxygen saturations   AO 90   PA 56   Cardiac output 3.5   Cardiac index 2.0   Coronary angiography:   Coronary dominance: right   Left mainstem: The left mainstem is patent with 30% distal left main stenosis.   Left anterior descending (LAD): The LAD is diffusely diseased in the midportion. There are sequential 50% stenoses, unchanged from previous study. The first diagonal Jannis Atkins is large in distribution with diffuse nonobstructive disease noted. The mid and distal LAD are patent without significant disease.   Left circumflex (LCx): The left circumflex has 50% ostial stenosis, 50% mid vessel stenosis, and to OM branches with no significant disease. There is an intermediate Doni Widmer is diffusely diseased with up to 70% stenosis. This vessel is small in caliber.   Right coronary artery (RCA): Dominant vessel. Tortuous with diffuse nonobstructive stenosis throughout the proximal and mid portions. There are diffuse 50% stenosis present. The distal vessel has an ulcerated-appearing irregular 90% stenosis involving the bifurcation of the PDA and posterolateral branches. The posterolateral branches are patent with mild diffuse disease. The PDA arises an acute angulation with 30-40% stenosis. The mid body of the PDA has an 80-90% stenosis in an area where the vessel is too small for PCI.   Left ventriculography: The ventricle appears the synchronous. The basal and midinferior wall are akinetic. The basal inferolateral wall is severely hypokinetic. The anterolateral wall and apex contract normally. The estimated LVEF is 30%.   PCI Procedure Note: Following the diagnostic procedure, the decision was made to proceed with PCI of the distal RCA. The lesion is complex, there is a large amount of myocardium involved. I felt there was a risk of potentially compromising the PDA Stefhanie Kachmar, but overall in my opinion the risk/benefit was favorable for PCI considering the severe stenosis involving the entire  RCA distribution. The sheath was upsized to a 6 [place]. Weight-based bivalirudin was given for anticoagulation. The patient was loaded with Plavix 600 mg on the table. Once a therapeutic ACT was achieved, a 6 [place] JR 4 guide catheter was inserted. A cougar coronary guidewire was used to cross the lesion into the PLA Jidenna Figgs. The lesion was predilated with a 2.5 x 15 mm balloon. The lesion was then stented with a 3.0 by 28m Promus DES  stent. The stent was postdilated with a 3.25 mm noncompliant balloon to 18 atmospheres. The PDA was stented across as the stent extended from the distal RCA proximal PLA Jannine Abreu. The PDA was totally occluded. I made a long attempt with a whisper wire using multiple bands on the tip of the wire, but all of these attempts were unsuccessful at rewiring the PDA. The patient was completely asymptomatic and specifically denied any chest pain. Her hemodynamics remained stable and there were no changes in her rhythm or blood pressure. A final image of the left coronary artery was obtained and this demonstrated a collateral from the LAD to the PDA Stanely Sexson. Following PCI, there was 0% residual stenosis and TIMI-3 flow. Final angiography confirmed an excellent result. Femoral hemostasis was achieved will be achieved with manual hemostasis. The patient tolerated the PCI procedure well. There were no immediate procedural complications. The patient was transferred to the post catheterization recovery area for further monitoring.   PCI Data:   Vessel - RCA/Segment - distal   Percent Stenosis (pre) 90   TIMI-flow 3   Stent 3.0 by 70m Promus DES   Percent Stenosis (post) 0   TIMI-flow (post) 3   Contrast: 175 cc Omnipaque   Radiation dose/Fluoro time: 15.5 minutes   Estimated Blood Loss: Minimal   Final Conclusions:  1. Nonobstructive disease involving the LAD, left circumflex, and OM/diagonal side branches   2. Severe stenosis of the distal RCA bifurcation, treated successfully with a  drug-eluting stent into the PLA Daksha Koone with residual occlusion of the PDA Fabiana Dromgoole   3. Severe segmental LV systolic dysfunction   Recommendations: Dual antiplatelet therapy with aspirin and Plavix for at least 12 months. Will cycle cardiac enzymes since the PDA Joeline Freer is occluded after stenting.   10/2014 Echo Study Conclusions  - Procedure narrative: Transthoracic echocardiography. Image   quality was suboptimal. The study was technically difficult, as a   result of poor sound wave transmission and body habitus. - Left ventricle: Systolic function is severely reduced, estimated   EF 15-20%. The cavity size was severely dilated. Doppler   parameters are consistent with abnormal left ventricular   relaxation (grade 1 diastolic dysfunction). Doppler parameters   are consistent with high ventricular filling pressure. Medial   E/e&' 24. - Regional wall motion abnormality: Akinesis of the basal-mid   inferior and basal inferolateral myocardium; severe hypokinesis   of the apical inferior and mid inferolateral myocardium. Severe,   diffuse hypokinesis. - Ventricular septum: Septal motion showed abnormal function and   dyssynergy. These changes are consistent with intraventricular   conduction delay. - Aortic valve: Mildly calcified annulus. Trileaflet; mildly   thickened leaflets. - Mitral valve: Mildly dilated annulus. Mildly thickened leaflets .   There was moderate eccentric regurgitation. Likely functional due   to mitral annular dilatation. - Left atrium: The atrium was severely dilated. Volume/bsa, S: 47.1   ml/m^2. - Right ventricle: Systolic function was mildly reduced. - Right atrium: The atrium was mildly dilated. - Atrial septum: There was increased thickness of the septum,   consistent with lipomatous hypertrophy. - Tricuspid valve: There was mild regurgitation. - Pulmonary arteries: PA peak pressure: 42 mm Hg (S). Mildly   elevated pulmonary pressures. - Pericardium,  extracardiac: There was a small pericardial   effusion, with no evidence of tamponade physiology.   04/2015 Carotid [place]PRESS[place]: 1. Mild (1-49%) stenosis of the proximal right internal carotid artery secondary to smooth heterogeneous atherosclerotic plaque. No interval change compared to recent  prior imaging from earlier this month. 2. Mild (1-49%) stenosis of the proximal left internal carotid artery secondary to heterogeneous and irregular atherosclerotic plaque. No interval change compared to recent prior imaging from earlier this month. 3. Vertebral arteries remain patent with normal antegrade flow. Signed,       10/2015 echo Study Conclusions  - Left ventricle: The cavity size was moderately to severely   dilated. Wall thickness was normal. Systolic function was   severely reduced. The estimated ejection fraction was in the   range of 25% to 30%. Diffuse hypokinesis. There is akinesis of   the basalanteroseptal and anterior myocardium. Doppler parameters   are consistent with abnormal left ventricular relaxation (grade 1   diastolic dysfunction). - Mitral valve: Calcified annulus. Mildly calcified leaflets .   There was mild regurgitation. - Right atrium: Central venous pressure (est): 3 mm Hg. - Atrial septum: No defect or patent foramen ovale was identified. - Tricuspid valve: There was trivial regurgitation. - Pulmonary arteries: PA peak pressure: 27 mm Hg (S). - Pericardium, extracardiac: A prominent pericardial fat pad was   present.  Impressions:  - Moderate to severe LV chamber dilatation with LVEF approximately   25-30%. There is diffuse hypokinesis with near akinesis of the   basal anteroseptal and anterior myocardium. Compared to the   previous study from June 2016 there has been some improvement in   LVEF. Grade 1 diastolic dysfunction. MAC with mild mitral   regurgitation. Trivial tricuspid regurgitation with PASP 27 mmHg.  Transthoracic  echocardiography.  M-mode, complete 2D, spectral Doppler, and color Doppler.  Birthdate:  Patient birthdate: 1941/01/05.  Age:  Patient is 81 yr old.  Sex:  Gender: female. BMI: 27.5 kg/m^2.  Blood pressure:     98/47  Patient status: Inpatient.  Study date:  Study date: 10/04/2015. Study time: 11:27 AM.  Location:  Echo laboratory     04/2021 echo IMPRESSIONS     1. Left ventricular ejection fraction, by estimation, is 25 to 30%. The  left ventricle has severely decreased function. The left ventricle  demonstrates global hypokinesis. The left ventricular internal cavity size  was moderately dilated. Left  ventricular diastolic parameters are consistent with Grade I diastolic  dysfunction (impaired relaxation). The average left ventricular global  longitudinal strain is -10.3 %. The global longitudinal strain is  abnormal.   2. Right ventricular systolic function is normal. The right ventricular  size is normal.   3. A small pericardial effusion is present. The pericardial effusion is  localized near the right atrium.   4. The mitral valve is normal in structure. Mild to moderate mitral valve  regurgitation. No evidence of mitral stenosis.   5. The aortic valve has an indeterminant number of cusps. Aortic valve  regurgitation is not visualized. No aortic stenosis is present.       Assessment and Plan   1. CAD - no symptoms, continue current meds   2. Chronic systolic HF - Medical therpay limited by soft bp's and renal insufficiency.Entresto and farxiga were too expensive - doing well without symptoms, continue current meds   3. Hyperlipidemia - statin changed previously due to cost, -we will request pcp labs   4. CKD 3B - limit nephrotoxic drugs - f/u pcp labs        F/u 6 months     Arnoldo Lenis, M.D.

## 2022-10-20 ENCOUNTER — Ambulatory Visit (INDEPENDENT_AMBULATORY_CARE_PROVIDER_SITE_OTHER): Payer: Medicare Other

## 2022-10-20 DIAGNOSIS — I5022 Chronic systolic (congestive) heart failure: Secondary | ICD-10-CM | POA: Diagnosis not present

## 2022-10-20 DIAGNOSIS — Z95810 Presence of automatic (implantable) cardiac defibrillator: Secondary | ICD-10-CM

## 2022-10-21 ENCOUNTER — Telehealth: Payer: Self-pay

## 2022-10-21 NOTE — Telephone Encounter (Signed)
Outreach made to Pt.  Advised of RRT. Scheduled to see GT in [place] to discuss and schedule gen change. Pt states daily alarm will not bother her.  [employer] alert received: CareAlert for RRT, triggered 10/21/22. Optivol fluid index above daily threshold with secondary indicator below reference (thoracic impedance). Followed by HF. Routing for further review (RRT)

## 2022-10-21 NOTE — Progress Notes (Signed)
EPIC Encounter for ICM Monitoring  Patient Name: Nicole Clements is a 81 y.o. female Date: 10/21/2022 Primary Care Physican: Lupita Raider, MD Primary Cardiologist: Wyline Mood Electrophysiologist: Joycelyn Schmid Pacing: 96.3%    06/13/2022 Weight: 138 lbs 09/17/2022 Weight: 138 lbs   Time in AT/AF  <0.1 hr/day (<0.1%)   ERI reached 10/21/2022   Spoke with patient and heart failure questions reviewed.  Pt asymptomatic for fluid accumulation.  Reports feeling well at this time and voices no complaints.     Optivol thoracic impedance suggesting possible fluid accumulation starting 11/30.    Prescribed:   Furosemide 20 mg Take 20 mg daily. You may take an extra 20 mg daily as needed for swelling.   Potassium 20 mEq take 1 tablet daily Spironolactone 25 mg take 0.5 tablet (12.5 mg total) twice a day   Labs:  11/19/2021 Creatinine 1.60, BUN 18, Potassium 4.0, Sodium 132, GFR 33 10/23/2021 Creatinine 1.40, BUN 10, Potassium 4.3, Sodium 134, GFR 39  Care Everywhere office note 10/15/2021 Creatinine 2.46, BUN 43, Potassium 5.0, Sodium 127, GFR 20  Care Everywhere office note 08/01/2021 Creatinine 1.65, BUN 26, Potassium 4.7, Sodium 131, GFR 32 A complete set of results can be found in Results Review.   Recommendations:  Advised to take extra Furosemide 20 mg x 2-3 days.     Follow-up plan: ICM clinic phone appointment on 10/28/2022 to recheck fluid levels.  91 day device clinic remote scheduled 12/29/2021.     EP/Cardiology Office Visits:    04/27/2023 with Dr Wyline Mood.  11/19/2021 with Dr Ladona Ridgel to discuss battery replacement   Copy of ICM check sent to Dr. Graciela Husbands.       3 month ICM trend: 10/21/2022.    12-14 Month ICM trend:     Karie Soda, RN 10/21/2022 1:16 PM

## 2022-10-23 DIAGNOSIS — Z720 Tobacco use: Secondary | ICD-10-CM | POA: Diagnosis not present

## 2022-10-23 DIAGNOSIS — M8580 Other specified disorders of bone density and structure, unspecified site: Secondary | ICD-10-CM | POA: Diagnosis not present

## 2022-10-23 DIAGNOSIS — I25119 Atherosclerotic heart disease of native coronary artery with unspecified angina pectoris: Secondary | ICD-10-CM | POA: Diagnosis not present

## 2022-10-23 DIAGNOSIS — I129 Hypertensive chronic kidney disease with stage 1 through stage 4 chronic kidney disease, or unspecified chronic kidney disease: Secondary | ICD-10-CM | POA: Diagnosis not present

## 2022-10-23 DIAGNOSIS — I340 Nonrheumatic mitral (valve) insufficiency: Secondary | ICD-10-CM | POA: Diagnosis not present

## 2022-10-23 DIAGNOSIS — Z950 Presence of cardiac pacemaker: Secondary | ICD-10-CM | POA: Diagnosis not present

## 2022-10-23 DIAGNOSIS — L989 Disorder of the skin and subcutaneous tissue, unspecified: Secondary | ICD-10-CM | POA: Diagnosis not present

## 2022-10-23 DIAGNOSIS — I5022 Chronic systolic (congestive) heart failure: Secondary | ICD-10-CM | POA: Diagnosis not present

## 2022-10-23 DIAGNOSIS — E063 Autoimmune thyroiditis: Secondary | ICD-10-CM | POA: Diagnosis not present

## 2022-10-23 DIAGNOSIS — Z0000 Encounter for general adult medical examination without abnormal findings: Secondary | ICD-10-CM | POA: Diagnosis not present

## 2022-10-23 DIAGNOSIS — N183 Chronic kidney disease, stage 3 unspecified: Secondary | ICD-10-CM | POA: Diagnosis not present

## 2022-10-23 DIAGNOSIS — E782 Mixed hyperlipidemia: Secondary | ICD-10-CM | POA: Diagnosis not present

## 2022-10-30 ENCOUNTER — Ambulatory Visit: Payer: Medicare Other

## 2022-10-31 NOTE — Progress Notes (Signed)
No ICM remote transmission received for 10/28/2022 and next ICM transmission scheduled for 11/05/2022.   

## 2022-11-06 ENCOUNTER — Encounter: Payer: Self-pay | Admitting: Internal Medicine

## 2022-11-07 NOTE — Progress Notes (Signed)
No ICM remote transmission received for 11/05/2022 and next ICM transmission scheduled for 12/01/2022.   

## 2022-11-11 NOTE — Progress Notes (Signed)
Remote ICD transmission.   

## 2022-11-19 ENCOUNTER — Ambulatory Visit: Payer: Medicare Other | Attending: Internal Medicine | Admitting: Internal Medicine

## 2022-11-19 ENCOUNTER — Encounter: Payer: Self-pay | Admitting: Internal Medicine

## 2022-11-19 VITALS — BP 110/84 | HR 84 | Ht 62.0 in | Wt 131.8 lb

## 2022-11-19 DIAGNOSIS — I5022 Chronic systolic (congestive) heart failure: Secondary | ICD-10-CM | POA: Diagnosis not present

## 2022-11-19 NOTE — Patient Instructions (Addendum)
Medication Instructions:  Your physician recommends that you continue on your current medications as directed. Please refer to the Current Medication list given to you today.  *If you need a refill on your cardiac medications before your next appointment, please call your pharmacy*   Lab Work: Your physician recommends that you return for lab work in: CBC, BMET   If you have labs (blood work) drawn today and your tests are completely normal, you will receive your results only by: MyChart Message (if you have MyChart) OR A paper copy in the mail If you have any lab test that is abnormal or we need to change your treatment, we will call you to review the results.   Testing/Procedures: Your physician has recommended that you have a defibrillator inserted. An implantable cardioverter defibrillator (ICD) is a small device that is placed in your chest or, in rare cases, your abdomen. This device uses electrical pulses or shocks to help control life-threatening, irregular heartbeats that could lead the heart to suddenly stop beating (sudden cardiac arrest). Leads are attached to the ICD that goes into your heart. This is done in the hospital and usually requires an overnight stay. Please see the instruction sheet given to you today for more information.    Follow-Up: At [facility], you and your health needs are our priority.  As part of our continuing mission to provide you with exceptional heart care, we have created designated Provider Care Teams.  These Care Teams include your primary Cardiologist (physician) and Advanced Practice Providers (APPs -  Physician Assistants and Nurse Practitioners) who all work together to provide you with the care you need, when you need it.  We recommend signing up for the patient portal called "MyChart".  Sign up information is provided on this After Visit Summary.  MyChart is used to connect with patients for Virtual Visits (Telemedicine).  Patients are  able to view lab/test results, encounter notes, upcoming appointments, etc.  Non-urgent messages can be sent to your provider as well.   To learn more about what you can do with MyChart, go to [email or web address].    Your next appointment:    Dates for gen change are 1/31, 2/12, 2/14, 2/19. Please call office with date.   Provider:   Lewayne Bunting, MD    Other Instructions Thank you for choosing [facility]!        Implantable Device Instructions    Nicole Clements  11/19/2022  You are scheduled for a ICD generator change on Monday, February 19 with Dr. Lewayne Bunting.  1. Pre procedure Lab testing:  [facility] in [place]     2. Please arrive at the Main Entrance A at [facility]: [address] [place], [place] [address] on February 19 at 5:30 AM (This time is two hours before your procedure to ensure your preparation). Free valet parking service is available. You will check in at ADMITTING. The support person will be asked to wait in the waiting room.  It is OK to have someone drop you off and come back when you are ready to be discharged.        Special note: Every effort is made to have your procedure done on time. Please understand that emergencies sometimes delay  scheduled procedures.  3.  No eating or drinking after midnight prior to procedure.     4.  Medication instructions:  On the morning of your procedure      5.  The night  before your procedure and the morning of your procedure scrub your neck/chest with CHG surgical scrub.  See instruction letter.  6. Plan to go home the same day, you will only stay overnight if medically necessary. 7. You MUST have a responsible adult to drive you home. 8. An adult MUST be with you the first 24 hours after you arrive home. 9. Bring a current list of your medications, and the last time and date medication taken. 10. Bring ID and current insurance cards. 11. Please wear clothes that are easy to get on and off and  wear slip-on shoes.    You will follow up with the [facility] 10-14 days after your procedure.  You will follow up with Dr. Cristopher Peru 91 days after your procedure.  These appointments will be made for you.   * If you have ANY questions after you get home, please call the office at [phone] or send a MyChart message.  FYI: For your safety, and to allow [place] to monitor your vital signs accurately during the surgery/procedure we request that if you have artificial nails, gel coating, SNS etc. Please have those removed prior to your surgery/procedure. Not having the nail coverings /polish removed may result in cancellation or delay of your surgery/procedure.    Nicole Clements - Preparing For Surgery    Before surgery, you can play an important role. Because skin is not sterile, your skin needs to be as free of germs as possible. You can reduce the number of germs on your skin by washing with CHG (chlorahexidine gluconate) Soap before surgery.  CHG is an antiseptic cleaner which kills germs and bonds with the skin to continue killing germs even after washing.  Please do not use if you have an allergy to CHG or antibacterial soaps.  If your skin becomes reddened/irritated stop using the CHG.   Do not shave (including legs and underarms) for at least 48 hours prior to first CHG shower.  It is OK to shave your face.  Please follow these instructions carefully:  1.  Shower the night before surgery and the morning of surgery with CHG.  2.  If you choose to wash your hair, wash your hair first as usual with your normal shampoo.  3.  After you shampoo, rinse your hair and body thoroughly to remove the shampoo.  4.  Use CHG as you would any other liquid soap.  You can apply CHG directly to the skin and wash gently with a clean washcloth. 5.  Apply the CHG Soap to your body ONLY FROM THE NECK DOWN.  Do not use on open wounds or open sores.  Avoid contact with your eyes, ears, mouth and  genitals (private parts).  Wash genitals (private parts) with your normal soap.  6.  Wash thoroughly, paying special attention to the area where your surgery will be performed.  7.  Thoroughly rinse your body with warm water from the neck down.   8.  DO NOT shower/wash with your normal soap after using and rinsing off the CHG soap.  9.  Pat yourself dry with a clean towel.           10.  Wear clean pajamas.           11.  Place clean sheets on your bed the night of your first shower and do not sleep with pets.  Day of Surgery: Do not apply any deodorants/lotions.  Please wear clean clothes to  the hospital/surgery center.

## 2022-11-19 NOTE — Progress Notes (Signed)
        HPI Nicole Clements returns today for ongoing followup. She is a pleasant 82 yo woman with an ICM, chronic systolic CHF and LBBB, s/p biv ICD insertion. She was followed by Dr. SK but prefers to be seen in our [place] office as she lives here. She has not had any ICD shocks. No chest pain or sob. She is still smoking. She admits to being sedentary.       Allergies  Allergen Reactions   Lactose Intolerance (Gi) Other (See Comments)      GI Upset    Sulfa Antibiotics Rash              Current Outpatient Medications  Medication Sig Dispense Refill   acetaminophen (TYLENOL) 500 MG tablet Take 1,000 mg by mouth every 8 (eight) hours as needed for mild pain or headache.       aspirin EC 81 MG tablet Take 1 tablet (81 mg total) by mouth daily. 90 tablet 3   carvedilol (COREG) 25 MG tablet TAKE 1 TABLET BY MOUTH TWICE DAILY 60 tablet 6   Cholecalciferol (VITAMIN D3) 2000 UNITS capsule Take 2,000 Units by mouth daily.       furosemide (LASIX) 20 MG tablet TAKE 1 TABLET BY MOUTH EVERY DAY. MAY TAKE AN ADDITIONAL TABLET EVERY DAY AS NEEDED FOR SWELLING. 180 tablet 1   losartan (COZAAR) 25 MG tablet TAKE 1 TABLET(25 MG) BY MOUTH DAILY 90 tablet 3   nitroGLYCERIN (NITROSTAT) 0.4 MG SL tablet Place 1 tablet (0.4 mg total) under the tongue every 5 (five) minutes x 3 doses as needed for chest pain. 25 tablet 2   Omega-3 Fatty Acids (FISH OIL) 1200 MG CAPS Take 1,200 mg by mouth daily.       potassium chloride SA (KLOR-CON M) 20 MEQ tablet TAKE 1 TABLET(20 MEQ) BY MOUTH DAILY 90 tablet 2   pravastatin (PRAVACHOL) 80 MG tablet Take 80 mg by mouth daily.       spironolactone (ALDACTONE) 25 MG tablet TAKE 1/2 TABLET(12.5 MG) BY MOUTH TWICE DAILY 90 tablet 3    No current facility-administered medications for this visit.            Past Medical History:  Diagnosis Date   AICD (automatic cardioverter/defibrillator) present     CAD (coronary artery disease) April 2014    DES to PLA, occluded  PDA 07/2014   CHF (congestive heart failure) (HCC)     Hypercholesterolemia     Hypertension     Ischemic cardiomyopathy      LVEF 25%-45%   IVCD (intraventricular conduction defect)     PVC's (premature ventricular contractions)     S/P colonoscopy August 2007    Hyperplastic polyps, rare sigmoid and descending colon diverticulosis, small internal hemorrhoids   STEMI (ST elevation myocardial infarction) (HCC) 07/22/1996      ROS:    All systems reviewed and negative except as noted in the HPI.          Past Surgical History:  Procedure Laterality Date   ABDOMINAL HYSTERECTOMY       CARDIAC CATHETERIZATION   April 2014    Med Rx   CATARACT EXTRACTION W/PHACO Right 09/15/2017    Procedure: CATARACT EXTRACTION PHACO AND INTRAOCULAR LENS PLACEMENT (IOC);  Surgeon: Shapiro, Mark, MD;  Location: [facility];  Service: Ophthalmology;  Laterality: Right;  CDE: 12.22   CATARACT EXTRACTION W/PHACO Left 09/29/2017    Procedure: CATARACT EXTRACTION PHACO AND INTRAOCULAR LENS PLACEMENT (  [facility]);  Surgeon: Shapiro, Mark, MD;  Location: [facility];  Service: Ophthalmology;  Laterality: Left;  CDE: 10.02   COLONOSCOPY N/A 12/24/2015    Dr. Fields:moderate sized internal hemorrhoids/moderate diverticulosis, negative microscopic colitis    EP IMPLANTABLE DEVICE   04/18/2015    BV ICD   EP IMPLANTABLE DEVICE N/A 04/18/2015    Procedure: BiV ICD Insertion CRT-D;  Surgeon: Steven C Klein, MD;  Location: [facility] INVASIVE CV LAB;  Service: Cardiovascular;  Laterality: N/A;   LEFT AND RIGHT HEART CATHETERIZATION WITH CORONARY ANGIOGRAM N/A 07/17/2014    Procedure: LEFT AND RIGHT HEART CATHETERIZATION WITH CORONARY ANGIOGRAM;  Surgeon: Michael D Cooper, MD;  Location: [facility] CATH LAB;  Service: Cardiovascular;  Laterality: N/A;   PERCUTANEOUS CORONARY STENT INTERVENTION (PCI-S)   07/17/2014    Procedure: PERCUTANEOUS CORONARY STENT INTERVENTION (PCI-S);  Surgeon: Michael D Cooper, MD;  Location: [facility] CATH LAB;  Service:  Cardiovascular;;  Distal RCA   S/P Hysterectomy                 Family History  Problem Relation Age of Onset   Hypertension Sister     Diabetes Mellitus II Sister     Hypertension Brother     Diabetes Mellitus II Brother     Hypertension Brother     Diabetes Mellitus II Brother     Hypertension Brother     Diabetes Mellitus II Brother     Colon cancer Neg Hx          Social History         Socioeconomic History   Marital status: Single      Spouse name: Not on file   Number of children: 1   Years of education: Not on file   Highest education level: Not on file  Occupational History   Occupation: Retired      Comment: Customer service rep  Tobacco Use   Smoking status: Every Day      Packs/day: 0.50      Years: 50.00      Total pack years: 25.00      Types: Cigarettes      Start date: 02/26/1962   Smokeless tobacco: Never   Tobacco comments:      down to less than 1/2 ppd  Vaping Use   Vaping Use: Never used  Substance and Sexual Activity   Alcohol use: No      Alcohol/week: 0.0 standard drinks of alcohol   Drug use: No   Sexual activity: Never      Birth control/protection: None  Other Topics Concern   Not on file  Social History Narrative   Not on file    Social Determinants of Health    Financial Resource Strain: Not on file  Food Insecurity: Not on file  Transportation Needs: Not on file  Physical Activity: Not on file  Stress: Not on file  Social Connections: Not on file  Intimate Partner Violence: Not on file        BP 110/84   Pulse 84   Ht 5' 2" (1.575 m)   Wt 131 lb 12.8 oz (59.8 kg)   SpO2 96%   BMI 24.11 kg/m    Physical Exam:   Well appearing 82 yo woman, NAD HEENT: Unremarkable Neck:  No JVD, no thyromegally Lymphatics:  No adenopathy Back:  No CVA tenderness Lungs:  Clear with no wheezes HEART:  Regular rate rhythm, no murmurs, no rubs, no clicks Abd:  soft, positive bowel   sounds, no organomegally, no rebound, no  guarding Ext:  2 plus pulses, no edema, no cyanosis, no clubbing Skin:  No rashes no nodules Neuro:  CN II through XII intact, motor grossly intact     DEVICE  Normal device function.  See PaceArt for details. ERI   Assess/Plan:   1. Chronic systolic heart failure - her symptoms are class 2. No change in her meds today. She will continue a low sodium diet. 2. ICD - her Medtronic BiV ICD is working normally though she is at [facility]. I offered her a biv ICD and a biv ppm and she would like to continue with her ICD and her EF is 25%.  3. CAD - she denies anginal symptoms.  4. Tobacco abuse - she is encouraged to stop smoking.   Denis Koppel,MD 

## 2022-12-01 ENCOUNTER — Ambulatory Visit: Payer: Medicare Other

## 2022-12-01 ENCOUNTER — Ambulatory Visit (INDEPENDENT_AMBULATORY_CARE_PROVIDER_SITE_OTHER): Payer: Medicare Other

## 2022-12-01 DIAGNOSIS — I5022 Chronic systolic (congestive) heart failure: Secondary | ICD-10-CM

## 2022-12-01 DIAGNOSIS — Z95810 Presence of automatic (implantable) cardiac defibrillator: Secondary | ICD-10-CM | POA: Diagnosis not present

## 2022-12-02 LAB — CUP PACEART REMOTE DEVICE CHECK
Battery Remaining Longevity: 1 mo — CL
Battery Voltage: 2.7 V
Brady Statistic AP VP Percent: 97.88 %
Brady Statistic AP VS Percent: 1.68 %
Brady Statistic AS VP Percent: 0.41 %
Brady Statistic AS VS Percent: 0.04 %
Brady Statistic RA Percent Paced: 94.71 %
Brady Statistic RV Percent Paced: 37.33 %
Date Time Interrogation Session: 20240129012503
HighPow Impedance: 49 Ohm
Implantable Lead Connection Status: 753985
Implantable Lead Connection Status: 753985
Implantable Lead Connection Status: 753985
Implantable Lead Implant Date: 20160615
Implantable Lead Implant Date: 20160615
Implantable Lead Implant Date: 20160615
Implantable Lead Location: 753858
Implantable Lead Location: 753859
Implantable Lead Location: 753860
Implantable Lead Model: 4398
Implantable Lead Model: 5076
Implantable Pulse Generator Implant Date: 20160615
Lead Channel Impedance Value: 1007 Ohm
Lead Channel Impedance Value: 342 Ohm
Lead Channel Impedance Value: 361 Ohm
Lead Channel Impedance Value: 399 Ohm
Lead Channel Impedance Value: 399 Ohm
Lead Channel Impedance Value: 418 Ohm
Lead Channel Impedance Value: 475 Ohm
Lead Channel Impedance Value: 475 Ohm
Lead Channel Impedance Value: 646 Ohm
Lead Channel Impedance Value: 665 Ohm
Lead Channel Impedance Value: 665 Ohm
Lead Channel Impedance Value: 931 Ohm
Lead Channel Impedance Value: 931 Ohm
Lead Channel Pacing Threshold Amplitude: 0.5 V
Lead Channel Pacing Threshold Amplitude: 0.625 V
Lead Channel Pacing Threshold Amplitude: 1.125 V
Lead Channel Pacing Threshold Pulse Width: 0.4 ms
Lead Channel Pacing Threshold Pulse Width: 0.4 ms
Lead Channel Pacing Threshold Pulse Width: 0.4 ms
Lead Channel Sensing Intrinsic Amplitude: 1.5 mV
Lead Channel Sensing Intrinsic Amplitude: 1.5 mV
Lead Channel Sensing Intrinsic Amplitude: 17.875 mV
Lead Channel Sensing Intrinsic Amplitude: 17.875 mV
Lead Channel Setting Pacing Amplitude: 1.5 V
Lead Channel Setting Pacing Amplitude: 2 V
Lead Channel Setting Pacing Amplitude: 2.25 V
Lead Channel Setting Pacing Pulse Width: 0.4 ms
Lead Channel Setting Pacing Pulse Width: 0.4 ms
Lead Channel Setting Sensing Sensitivity: 0.45 mV
Zone Setting Status: 755011
Zone Setting Status: 755011

## 2022-12-02 NOTE — Progress Notes (Signed)
EPIC Encounter for ICM Monitoring  Patient Name: Nicole Clements is a 82 y.o. female Date: 12/02/2022 Primary Care Physican: Mayra Neer, MD Primary Cardiologist: Harl Bowie Electrophysiologist: Santina Evans Pacing: 94.5%    06/13/2022 Weight: 138 lbs 09/17/2022 Weight: 138 lbs   Time in AT/AF  <0.1 hr/day (<0.1%)   ERI reached 10/21/2022   Spoke with patient and heart failure questions reviewed.  Pt asymptomatic for fluid accumulation.  Reports feeling well at this time and voices no complaints.   Device change scheduled on 2/19.     Optivol thoracic impedance suggesting possible fluid accumulation starting 11/30.  Unable to maintain normal fluid levels since 10/02/2022.  Fluid index greater than normal threshold since 09/10/2022   Prescribed:   Furosemide 20 mg Take 20 mg daily. You may take an extra 20 mg daily as needed for swelling.   Potassium 20 mEq take 1 tablet daily Spironolactone 25 mg take 0.5 tablet (12.5 mg total) twice a day   Labs:  11/19/2021 Creatinine 1.60, BUN 18, Potassium 4.0, Sodium 132, GFR 33 10/23/2021 Creatinine 1.40, BUN 10, Potassium 4.3, Sodium 134, GFR 39  Care Everywhere office note 10/15/2021 Creatinine 2.46, BUN 43, Potassium 5.0, Sodium 127, GFR 20  Care Everywhere office note 08/01/2021 Creatinine 1.65, BUN 26, Potassium 4.7, Sodium 131, GFR 32 A complete set of results can be found in Results Review.   Recommendations: Offered assistance to send manual remote transmission on 2/5 to recheck fluid levels but patient declined. Unable to schedule automatic transmission before device change.   Will follow up after device change, once Optivol thoracic impedance baseline has developed.      Follow-up plan: ICM clinic phone appointment on 02/02/2023.  91 day device clinic remote scheduled pending device change.     EP/Cardiology Office Visits:    04/27/2023 with Dr Harl Bowie.    Copy of ICM check sent to Dr. Lovena Le and Dr Harl Bowie as Juluis Rainier.      3 month ICM trend:  12/01/2022.    12-14 Month ICM trend:     Rosalene Billings, RN 12/02/2022 3:57 PM

## 2022-12-12 DIAGNOSIS — I5022 Chronic systolic (congestive) heart failure: Secondary | ICD-10-CM | POA: Diagnosis not present

## 2022-12-13 LAB — CBC
Hematocrit: 33.1 % — ABNORMAL LOW (ref 34.0–46.6)
Hemoglobin: 10.5 g/dL — ABNORMAL LOW (ref 11.1–15.9)
MCH: 29.6 pg (ref 26.6–33.0)
MCHC: 31.7 g/dL (ref 31.5–35.7)
MCV: 93 fL (ref 79–97)
Platelets: 225 10*3/uL (ref 150–450)
RBC: 3.55 x10E6/uL — ABNORMAL LOW (ref 3.77–5.28)
RDW: 12.7 % (ref 11.7–15.4)
WBC: 5.6 10*3/uL (ref 3.4–10.8)

## 2022-12-13 LAB — BASIC METABOLIC PANEL
BUN/Creatinine Ratio: 11 — ABNORMAL LOW (ref 12–28)
BUN: 16 mg/dL (ref 8–27)
CO2: 20 mmol/L (ref 20–29)
Calcium: 9.3 mg/dL (ref 8.7–10.3)
Chloride: 97 mmol/L (ref 96–106)
Creatinine, Ser: 1.47 mg/dL — ABNORMAL HIGH (ref 0.57–1.00)
Glucose: 117 mg/dL — ABNORMAL HIGH (ref 70–99)
Potassium: 4.1 mmol/L (ref 3.5–5.2)
Sodium: 133 mmol/L — ABNORMAL LOW (ref 134–144)
eGFR: 36 mL/min/{1.73_m2} — ABNORMAL LOW (ref >59)

## 2022-12-15 ENCOUNTER — Ambulatory Visit: Payer: Medicare Other | Admitting: Gastroenterology

## 2022-12-19 NOTE — Pre-Procedure Instructions (Signed)
Attempted to call patient regarding procedure.  No answer.

## 2022-12-22 ENCOUNTER — Encounter (HOSPITAL_COMMUNITY)
Admission: RE | Disposition: A | Discharge status: Home / Self Care | Payer: Self-pay | Source: Home / Self Care | Attending: Internal Medicine

## 2022-12-22 ENCOUNTER — Ambulatory Visit (HOSPITAL_COMMUNITY)
Admission: RE | Admit: 2022-12-22 | Discharge: 2022-12-22 | Disposition: A | Discharge status: Home / Self Care | Payer: Medicare Other | Attending: Internal Medicine | Admitting: Internal Medicine

## 2022-12-22 ENCOUNTER — Other Ambulatory Visit: Payer: Self-pay

## 2022-12-22 DIAGNOSIS — I2510 Atherosclerotic heart disease of native coronary artery without angina pectoris: Secondary | ICD-10-CM | POA: Diagnosis not present

## 2022-12-22 DIAGNOSIS — I255 Ischemic cardiomyopathy: Secondary | ICD-10-CM | POA: Insufficient documentation

## 2022-12-22 DIAGNOSIS — Z4502 Encounter for adjustment and management of automatic implantable cardiac defibrillator: Secondary | ICD-10-CM | POA: Diagnosis not present

## 2022-12-22 DIAGNOSIS — Z45010 Encounter for checking and testing of cardiac pacemaker pulse generator [battery]: Secondary | ICD-10-CM | POA: Diagnosis not present

## 2022-12-22 DIAGNOSIS — F17210 Nicotine dependence, cigarettes, uncomplicated: Secondary | ICD-10-CM | POA: Insufficient documentation

## 2022-12-22 DIAGNOSIS — I447 Left bundle-branch block, unspecified: Secondary | ICD-10-CM | POA: Diagnosis not present

## 2022-12-22 DIAGNOSIS — Z95810 Presence of automatic (implantable) cardiac defibrillator: Secondary | ICD-10-CM

## 2022-12-22 DIAGNOSIS — I5022 Chronic systolic (congestive) heart failure: Secondary | ICD-10-CM | POA: Insufficient documentation

## 2022-12-22 HISTORY — PX: BIV ICD GENERATOR CHANGEOUT: EP1194

## 2022-12-22 SURGERY — BIV ICD GENERATOR CHANGEOUT

## 2022-12-22 MED ORDER — MIDAZOLAM HCL 5 MG/5ML IJ SOLN
INTRAMUSCULAR | Status: AC
  Filled 2022-12-22: qty 5

## 2022-12-22 MED ORDER — LIDOCAINE HCL 1 % IJ SOLN
INTRAMUSCULAR | Status: AC
  Filled 2022-12-22: qty 60

## 2022-12-22 MED ORDER — SODIUM CHLORIDE 0.9 % IV SOLN
INTRAVENOUS | Status: AC
  Filled 2022-12-22: qty 2

## 2022-12-22 MED ORDER — LIDOCAINE HCL (PF) 1 % IJ SOLN
INTRAMUSCULAR | Status: DC | PRN
  Administered 2022-12-22: 50 mL

## 2022-12-22 MED ORDER — ACETAMINOPHEN 325 MG PO TABS: 325.0000 mg | ORAL_TABLET | ORAL | Status: DC | PRN

## 2022-12-22 MED ORDER — CEFAZOLIN SODIUM-DEXTROSE 2-4 GM/100ML-% IV SOLN
2.0000 g | INTRAVENOUS | Status: AC
  Administered 2022-12-22: 2 g via INTRAVENOUS

## 2022-12-22 MED ORDER — CHLORHEXIDINE GLUCONATE 4 % EX LIQD: 4.0000 | Freq: Once | CUTANEOUS | Status: DC

## 2022-12-22 MED ORDER — FENTANYL CITRATE (PF) 100 MCG/2ML IJ SOLN
INTRAMUSCULAR | Status: DC | PRN
  Administered 2022-12-22: 25 ug via INTRAVENOUS

## 2022-12-22 MED ORDER — SODIUM CHLORIDE 0.9 % IV SOLN
80.0000 mg | INTRAVENOUS | Status: AC
  Administered 2022-12-22: 80 mg

## 2022-12-22 MED ORDER — MIDAZOLAM HCL 5 MG/5ML IJ SOLN
INTRAMUSCULAR | Status: DC | PRN
  Administered 2022-12-22: 2 mg via INTRAVENOUS

## 2022-12-22 MED ORDER — CEFAZOLIN SODIUM-DEXTROSE 2-4 GM/100ML-% IV SOLN
INTRAVENOUS | Status: AC
  Filled 2022-12-22: qty 100

## 2022-12-22 MED ORDER — FENTANYL CITRATE (PF) 100 MCG/2ML IJ SOLN
INTRAMUSCULAR | Status: AC
  Filled 2022-12-22: qty 2

## 2022-12-22 MED ORDER — ONDANSETRON HCL 4 MG/2ML IJ SOLN: 4.0000 mg | Freq: Four times a day (QID) | INTRAMUSCULAR | Status: DC | PRN

## 2022-12-22 MED ORDER — SODIUM CHLORIDE 0.9 % IV SOLN: INTRAVENOUS | Status: DC

## 2022-12-22 MED ORDER — POVIDONE-IODINE 10 % EX SWAB
2.0000 | Freq: Once | CUTANEOUS | Status: AC
  Administered 2022-12-22: 2 via TOPICAL

## 2022-12-22 SURGICAL SUPPLY — 6 items
CABLE SURGICAL S-101-97-12 (CABLE) ×1 IMPLANT
ICD COBALT XT QUAD CRT DTPA2QQ (ICD Generator) IMPLANT
PAD DEFIB RADIO PHYSIO CONN (PAD) ×1 IMPLANT
POUCH AIGIS-R ANTIBACT ICD (Mesh General) ×1 IMPLANT
POUCH AIGIS-R ANTIBACT ICD LRG (Mesh General) IMPLANT
TRAY PACEMAKER INSERTION (PACKS) ×1 IMPLANT

## 2022-12-22 NOTE — H&P (Signed)
HPI Nicole Clements returns today for ongoing followup. She is a pleasant 82 yo woman with an ICM, chronic systolic CHF and LBBB, s/p biv ICD insertion. She was followed by Dr. Renaldo Reel but prefers to be seen in our [place] office as she lives here. She has not had any ICD shocks. No chest pain or sob. She is still smoking. She admits to being sedentary.       Allergies  Allergen Reactions   Lactose Intolerance (Gi) Other (See Comments)      GI Upset    Sulfa Antibiotics Rash              Current Outpatient Medications  Medication Sig Dispense Refill   acetaminophen (TYLENOL) 500 MG tablet Take 1,000 mg by mouth every 8 (eight) hours as needed for mild pain or headache.       aspirin EC 81 MG tablet Take 1 tablet (81 mg total) by mouth daily. 90 tablet 3   carvedilol (COREG) 25 MG tablet TAKE 1 TABLET BY MOUTH TWICE DAILY 60 tablet 6   Cholecalciferol (VITAMIN D3) 2000 UNITS capsule Take 2,000 Units by mouth daily.       furosemide (LASIX) 20 MG tablet TAKE 1 TABLET BY MOUTH EVERY DAY. MAY TAKE AN ADDITIONAL TABLET EVERY DAY AS NEEDED FOR SWELLING. 180 tablet 1   losartan (COZAAR) 25 MG tablet TAKE 1 TABLET(25 MG) BY MOUTH DAILY 90 tablet 3   nitroGLYCERIN (NITROSTAT) 0.4 MG SL tablet Place 1 tablet (0.4 mg total) under the tongue every 5 (five) minutes x 3 doses as needed for chest pain. 25 tablet 2   Omega-3 Fatty Acids (FISH OIL) 1200 MG CAPS Take 1,200 mg by mouth daily.       potassium chloride SA (KLOR-CON M) 20 MEQ tablet TAKE 1 TABLET(20 MEQ) BY MOUTH DAILY 90 tablet 2   pravastatin (PRAVACHOL) 80 MG tablet Take 80 mg by mouth daily.       spironolactone (ALDACTONE) 25 MG tablet TAKE 1/2 TABLET(12.5 MG) BY MOUTH TWICE DAILY 90 tablet 3    No current facility-administered medications for this visit.            Past Medical History:  Diagnosis Date   AICD (automatic cardioverter/defibrillator) present     CAD (coronary artery disease) April 2014    DES to PLA, occluded  PDA 07/2014   CHF (congestive heart failure) (HCC)     Hypercholesterolemia     Hypertension     Ischemic cardiomyopathy      LVEF 25%-45%   IVCD (intraventricular conduction defect)     PVC's (premature ventricular contractions)     S/P colonoscopy August 2007    Hyperplastic polyps, rare sigmoid and descending colon diverticulosis, small internal hemorrhoids   STEMI (ST elevation myocardial infarction) ([place]) 07/22/1996      ROS:    All systems reviewed and negative except as noted in the HPI.          Past Surgical History:  Procedure Laterality Date   ABDOMINAL HYSTERECTOMY       CARDIAC CATHETERIZATION   April 2014    Med Rx   CATARACT EXTRACTION W/PHACO Right 09/15/2017    Procedure: CATARACT EXTRACTION PHACO AND INTRAOCULAR LENS PLACEMENT ([place]);  Surgeon: Rutherford Guys, MD;  Location: [facility];  Service: Ophthalmology;  Laterality: Right;  CDE: 12.22   CATARACT EXTRACTION W/PHACO Left 09/29/2017    Procedure: CATARACT EXTRACTION PHACO AND INTRAOCULAR LENS PLACEMENT (  [place]);  Surgeon: Rutherford Guys, MD;  Location: [facility];  Service: Ophthalmology;  Laterality: Left;  CDE: 10.02   COLONOSCOPY N/A 12/24/2015    Dr. Fields:moderate sized internal hemorrhoids/moderate diverticulosis, negative microscopic colitis    EP IMPLANTABLE DEVICE   04/18/2015    BV ICD   EP IMPLANTABLE DEVICE N/A 04/18/2015    Procedure: BiV ICD Insertion CRT-D;  Surgeon: Deboraha Sprang, MD;  Location: [facility] CV LAB;  Service: Cardiovascular;  Laterality: N/A;   LEFT AND RIGHT HEART CATHETERIZATION WITH CORONARY ANGIOGRAM N/A 07/17/2014    Procedure: LEFT AND RIGHT HEART CATHETERIZATION WITH CORONARY ANGIOGRAM;  Surgeon: Blane Ohara, MD;  Location: [facility] CATH LAB;  Service: Cardiovascular;  Laterality: N/A;   PERCUTANEOUS CORONARY STENT INTERVENTION (PCI-S)   07/17/2014    Procedure: PERCUTANEOUS CORONARY STENT INTERVENTION (PCI-S);  Surgeon: Blane Ohara, MD;  Location: [facility] CATH LAB;  Service:  Cardiovascular;;  Distal RCA   S/P Hysterectomy                 Family History  Problem Relation Age of Onset   Hypertension Sister     Diabetes Mellitus II Sister     Hypertension Brother     Diabetes Mellitus II Brother     Hypertension Brother     Diabetes Mellitus II Brother     Hypertension Brother     Diabetes Mellitus II Brother     Colon cancer Neg Hx          Social History         Socioeconomic History   Marital status: Single      Spouse name: Not on file   Number of children: 1   Years of education: Not on file   Highest education level: Not on file  Occupational History   Occupation: Retired      Comment: Therapist, art rep  Tobacco Use   Smoking status: Every Day      Packs/day: 0.50      Years: 50.00      Total pack years: 25.00      Types: Cigarettes      Start date: 02/26/1962   Smokeless tobacco: Never   Tobacco comments:      down to less than 1/2 ppd  Vaping Use   Vaping Use: Never used  Substance and Sexual Activity   Alcohol use: No      Alcohol/week: 0.0 standard drinks of alcohol   Drug use: No   Sexual activity: Never      Birth control/protection: None  Other Topics Concern   Not on file  Social History Narrative   Not on file    Social Determinants of Health    Financial Resource Strain: Not on file  Food Insecurity: Not on file  Transportation Needs: Not on file  Physical Activity: Not on file  Stress: Not on file  Social Connections: Not on file  Intimate Partner Violence: Not on file        BP 110/84   Pulse 84   Ht 5' 2"$  (1.575 m)   Wt 131 lb 12.8 oz (59.8 kg)   SpO2 96%   BMI 24.11 kg/m    Physical Exam:   Well appearing 82 yo woman, NAD HEENT: Unremarkable Neck:  No JVD, no thyromegally Lymphatics:  No adenopathy Back:  No CVA tenderness Lungs:  Clear with no wheezes HEART:  Regular rate rhythm, no murmurs, no rubs, no clicks Abd:  soft, positive bowel  sounds, no organomegally, no rebound, no  guarding Ext:  2 plus pulses, no edema, no cyanosis, no clubbing Skin:  No rashes no nodules Neuro:  CN II through XII intact, motor grossly intact     DEVICE  Normal device function.  See PaceArt for details. ERI   Assess/Plan:   1. Chronic systolic heart failure - her symptoms are class 2. No change in her meds today. She will continue a low sodium diet. 2. ICD - her Medtronic BiV ICD is working normally though she is at [employer]. I offered her a biv ICD and a biv ppm and she would like to continue with her ICD and her EF is 25%.  3. CAD - she denies anginal symptoms.  4. Tobacco abuse - she is encouraged to stop smoking.   Carleene Overlie Luby Seamans,MD

## 2022-12-22 NOTE — Discharge Instructions (Signed)

## 2022-12-23 ENCOUNTER — Encounter (HOSPITAL_COMMUNITY): Payer: Self-pay | Admitting: Internal Medicine

## 2023-01-01 ENCOUNTER — Ambulatory Visit: Payer: Medicare Other

## 2023-01-07 ENCOUNTER — Ambulatory Visit: Payer: Medicare Other | Attending: Internal Medicine

## 2023-01-07 DIAGNOSIS — I5022 Chronic systolic (congestive) heart failure: Secondary | ICD-10-CM | POA: Diagnosis not present

## 2023-01-07 LAB — CUP PACEART INCLINIC DEVICE CHECK
Date Time Interrogation Session: 20240306142228
Implantable Lead Connection Status: 753985
Implantable Lead Connection Status: 753985
Implantable Lead Connection Status: 753985
Implantable Lead Implant Date: 20160615
Implantable Lead Implant Date: 20160615
Implantable Lead Implant Date: 20160615
Implantable Lead Location: 753858
Implantable Lead Location: 753859
Implantable Lead Location: 753860
Implantable Lead Model: 4398
Implantable Lead Model: 5076
Implantable Pulse Generator Implant Date: 20240219

## 2023-01-07 NOTE — Progress Notes (Signed)
Wound check appointment. Steri-strips removed. Wound without redness or edema. Incision edges approximated, wound well healed. Normal device function. Thresholds, sensing, and impedances consistent with implant measurements. Device programmed at chronic outputs. Histogram distribution appropriate for patient and level of activity. No mode switches.  One brief ventricular arrhythmia noted. Patient educated about wound care and shock plan. ROV in 3 months with implanting physician.

## 2023-01-07 NOTE — Patient Instructions (Signed)
After Your ICD (Implantable Cardiac Defibrillator)    Monitor your defibrillator site for redness, swelling, and drainage. Call the device clinic at [phone] if you experience these symptoms or fever/chills.  Your incision was closed with Steri-strips or staples:  You may shower 7 days after your procedure and wash your incision with soap and water. Avoid lotions, ointments, or perfumes over your incision until it is well-healed.  You may use a hot tub or a pool after your wound check appointment if the incision is completely closed.  There are no restrictions in arm movement after your wound check appointment.  Your ICD is designed to protect you from life threatening heart rhythms. Because of this, you may receive a shock.   1 shock with no symptoms:  Call the office during business hours. 1 shock with symptoms (chest pain, chest pressure, dizziness, lightheadedness, shortness of breath, overall feeling unwell):  Call 911. If you experience 2 or more shocks in 24 hours:  Call 911. If you receive a shock, you should not drive.  [place] DMV - no driving for 6 months if you receive appropriate therapy from your ICD.   ICD Alerts:  Some alerts are vibratory and others beep. These are NOT emergencies. Please call our office to let us know. If this occurs at night or on weekends, it can wait until the next business day. Send a remote transmission.  If your device is capable of reading fluid status (for heart failure), you will be offered monthly monitoring to review this with you.   Remote monitoring is used to monitor your ICD from home. This monitoring is scheduled every 91 days by our office. It allows [place] to keep an eye on the functioning of your device to ensure it is working properly. You will routinely see your Electrophysiologist annually (more often if necessary).

## 2023-01-14 ENCOUNTER — Other Ambulatory Visit (HOSPITAL_COMMUNITY): Payer: Self-pay | Admitting: Family Medicine

## 2023-01-14 DIAGNOSIS — M8580 Other specified disorders of bone density and structure, unspecified site: Secondary | ICD-10-CM

## 2023-01-14 DIAGNOSIS — Z780 Asymptomatic menopausal state: Secondary | ICD-10-CM

## 2023-01-21 ENCOUNTER — Ambulatory Visit (HOSPITAL_COMMUNITY)
Admission: RE | Admit: 2023-01-21 | Discharge: 2023-01-21 | Disposition: A | Discharge status: Home / Self Care | Payer: Medicare Other | Source: Ambulatory Visit | Attending: Family Medicine | Admitting: Family Medicine

## 2023-01-21 DIAGNOSIS — Z780 Asymptomatic menopausal state: Secondary | ICD-10-CM | POA: Insufficient documentation

## 2023-01-21 DIAGNOSIS — M85852 Other specified disorders of bone density and structure, left thigh: Secondary | ICD-10-CM | POA: Diagnosis not present

## 2023-02-02 ENCOUNTER — Ambulatory Visit: Payer: Medicare Other | Attending: Internal Medicine

## 2023-02-02 DIAGNOSIS — I5022 Chronic systolic (congestive) heart failure: Secondary | ICD-10-CM | POA: Diagnosis not present

## 2023-02-02 DIAGNOSIS — Z95810 Presence of automatic (implantable) cardiac defibrillator: Secondary | ICD-10-CM | POA: Diagnosis not present

## 2023-02-04 NOTE — Progress Notes (Signed)
EPIC Encounter for ICM Monitoring  Patient Name: Nicole Clements is a 82 y.o. female Date: 02/04/2023 Primary Care Physican: Mayra Neer, MD Primary Cardiologist: Harl Bowie Electrophysiologist: Santina Evans Pacing: 94.8%    02/04/2023 Weight: 138 lbs   Time in AT/AF  0.0 hours/day (0.0 %)    Spoke with patient and heart failure questions reviewed.  Pt asymptomatic for fluid accumulation.  Reports feeling well at this time and voices no complaints.      Optivol thoracic impedance suggesting normal fluid levels.   Prescribed:   Furosemide 20 mg Take 20 mg daily. You may take an extra 20 mg daily as needed for swelling.   Potassium 20 mEq take 1 tablet daily Spironolactone 25 mg take 0.5 tablet (12.5 mg total) twice a day   Labs:  12/12/2022 Creatinine 1.47, BUN 16, Potassium 4.1, Sodium 133, GFR 36 A complete set of results can be found in Results Review.   Recommendations: No changes and encouraged to call if experiencing any fluid symptoms.   Follow-up plan: ICM clinic phone appointment on 03/09/2023.  91 day device clinic remote scheduled 03/25/2023   EP/Cardiology Office Visits:    04/27/2023 with Dr Harl Bowie.   03/11/2023 with Dr Lovena Le.   Copy of ICM check sent to Dr. Lovena Le.  3 month ICM trend: 02/01/2023.    12-14 Month ICM trend:     Rosalene Billings, RN 02/04/2023 12:51 PM

## 2023-03-09 ENCOUNTER — Ambulatory Visit: Payer: Medicare Other | Attending: Internal Medicine

## 2023-03-09 ENCOUNTER — Other Ambulatory Visit: Payer: Self-pay | Admitting: Cardiology

## 2023-03-09 DIAGNOSIS — I5022 Chronic systolic (congestive) heart failure: Secondary | ICD-10-CM | POA: Diagnosis not present

## 2023-03-09 DIAGNOSIS — Z95810 Presence of automatic (implantable) cardiac defibrillator: Secondary | ICD-10-CM

## 2023-03-10 NOTE — Progress Notes (Signed)
EPIC Encounter for ICM Monitoring  Patient Name: Nicole Clements is a 82 y.o. female Date: 03/10/2023 Primary Care Physican: Lupita Raider, MD Primary Cardiologist: Wyline Mood Electrophysiologist: Rae Roam Pacing: 94.4%    02/04/2023 Weight: 138 lbs 03/10/2023 Weight: 138 lbs   Time in AT/AF  0.0 hours/day (0.0 %)     Spoke with patient and heart failure questions reviewed.  Pt asymptomatic for fluid accumulation.  Reports feeling well at this time and voices no complaints.    No fluid symptoms during decreased impedance.    Optivol thoracic impedance suggesting possible fluid accumulation starting 4/22 and returned to baseline normal 5/5.   Prescribed:   Furosemide 20 mg Take 20 mg daily. You may take an extra 20 mg daily as needed for swelling.   Potassium 20 mEq take 1 tablet daily Spironolactone 25 mg take 0.5 tablet (12.5 mg total) twice a day   Labs:  12/12/2022 Creatinine 1.47, BUN 16, Potassium 4.1, Sodium 133, GFR 36 A complete set of results can be found in Results Review.   Recommendations: No changes and encouraged to call if experiencing any fluid symptoms.   Follow-up plan: ICM clinic phone appointment on 04/13/2023.  91 day device clinic remote scheduled 03/25/2023   EP/Cardiology Office Visits:    04/27/2023 with Dr Wyline Mood.   03/11/2023 with Dr Ladona Ridgel.   Copy of ICM check sent to Dr. Ladona Ridgel.  3 month ICM trend: 03/09/2023.    12-14 Month ICM trend:     Karie Soda, RN 03/10/2023 12:32 PM

## 2023-03-11 ENCOUNTER — Other Ambulatory Visit: Payer: Self-pay | Admitting: Cardiology

## 2023-03-11 ENCOUNTER — Encounter: Payer: Self-pay | Admitting: Internal Medicine

## 2023-03-11 ENCOUNTER — Ambulatory Visit: Payer: Medicare Other | Attending: Internal Medicine | Admitting: Internal Medicine

## 2023-03-11 VITALS — BP 106/62 | HR 63 | Ht 62.5 in | Wt 125.0 lb

## 2023-03-11 DIAGNOSIS — I5022 Chronic systolic (congestive) heart failure: Secondary | ICD-10-CM | POA: Diagnosis not present

## 2023-03-11 NOTE — Progress Notes (Signed)
HPI Nicole Clements returns today for ongoing followup. She is a pleasant 82 yo woman with an ICM, chronic systolic CHF and LBBB, s/p biv ICD insertion. She was followed by Dr. Crissie Sickles but prefers to be seen in our [place] office as she lives here. She has not had any ICD shocks. No chest pain or sob. She is still smoking. She admits to being sedentary. No problems after her ICD gen change out 3 months ago. Allergies  Allergen Reactions   Lactose Intolerance (Gi) Other (See Comments)    GI Upset    Sulfa Antibiotics Rash     Current Outpatient Medications  Medication Sig Dispense Refill   acetaminophen (TYLENOL) 325 MG tablet Take by mouth.     aspirin EC 81 MG tablet Take 1 tablet (81 mg total) by mouth daily. 90 tablet 3   carvedilol (COREG) 25 MG tablet TAKE 1 TABLET BY MOUTH TWICE DAILY 60 tablet 6   Cholecalciferol (VITAMIN D3) 2000 UNITS capsule Take 2,000 Units by mouth daily.     furosemide (LASIX) 20 MG tablet TAKE 1 TABLET BY MOUTH EVERY DAY. MAY TAKE AN ADDITIONAL TABLET EVERY DAY AS NEEDED FOR SWELLING. 180 tablet 1   losartan (COZAAR) 25 MG tablet TAKE 1 TABLET(25 MG) BY MOUTH DAILY 90 tablet 3   nitroGLYCERIN (NITROSTAT) 0.4 MG SL tablet Place 1 tablet (0.4 mg total) under the tongue every 5 (five) minutes x 3 doses as needed for chest pain. 25 tablet 2   Omega-3 Fatty Acids (FISH OIL) 1200 MG CAPS Take 1,200 mg by mouth daily.     potassium chloride SA (KLOR-CON M) 20 MEQ tablet TAKE 1 TABLET(20 MEQ) BY MOUTH DAILY 90 tablet 2   pravastatin (PRAVACHOL) 80 MG tablet Take 80 mg by mouth daily.     spironolactone (ALDACTONE) 25 MG tablet TAKE 1/2 TABLET(12.5 MG) BY MOUTH TWICE DAILY 90 tablet 3   No current facility-administered medications for this visit.     Past Medical History:  Diagnosis Date   AICD (automatic cardioverter/defibrillator) present    CAD (coronary artery disease) April 2014   DES to PLA, occluded PDA 07/2014   CHF (congestive heart failure) (HCC)     Hypercholesterolemia    Hypertension    Ischemic cardiomyopathy    LVEF 25%-45%   IVCD (intraventricular conduction defect)    PVC's (premature ventricular contractions)    S/P colonoscopy August 2007   Hyperplastic polyps, rare sigmoid and descending colon diverticulosis, small internal hemorrhoids   STEMI (ST elevation myocardial infarction) (HCC) 07/22/1996    ROS:   All systems reviewed and negative except as noted in the HPI.   Past Surgical History:  Procedure Laterality Date   ABDOMINAL HYSTERECTOMY     BIV ICD GENERATOR CHANGEOUT N/A 12/22/2022   Procedure: BIV ICD GENERATOR CHANGEOUT;  Surgeon: Marinus Maw, MD;  Location: [facility] INVASIVE CV LAB;  Service: Cardiovascular;  Laterality: N/A;   CARDIAC CATHETERIZATION  April 2014   Med Rx   CATARACT EXTRACTION W/PHACO Right 09/15/2017   Procedure: CATARACT EXTRACTION PHACO AND INTRAOCULAR LENS PLACEMENT (IOC);  Surgeon: Jethro Bolus, MD;  Location: [facility];  Service: Ophthalmology;  Laterality: Right;  CDE: 12.22   CATARACT EXTRACTION W/PHACO Left 09/29/2017   Procedure: CATARACT EXTRACTION PHACO AND INTRAOCULAR LENS PLACEMENT (IOC);  Surgeon: Jethro Bolus, MD;  Location: [facility];  Service: Ophthalmology;  Laterality: Left;  CDE: 10.02   COLONOSCOPY N/A 12/24/2015   Dr. Fields:moderate sized internal hemorrhoids/moderate diverticulosis,  negative microscopic colitis    EP IMPLANTABLE DEVICE  04/18/2015   BV ICD   EP IMPLANTABLE DEVICE N/A 04/18/2015   Procedure: BiV ICD Insertion CRT-D;  Surgeon: Duke Salvia, MD;  Location: [facility] INVASIVE CV LAB;  Service: Cardiovascular;  Laterality: N/A;   LEFT AND RIGHT HEART CATHETERIZATION WITH CORONARY ANGIOGRAM N/A 07/17/2014   Procedure: LEFT AND RIGHT HEART CATHETERIZATION WITH CORONARY ANGIOGRAM;  Surgeon: Micheline Chapman, MD;  Location: [facility] CATH LAB;  Service: Cardiovascular;  Laterality: N/A;   PERCUTANEOUS CORONARY STENT INTERVENTION (PCI-S)  07/17/2014   Procedure: PERCUTANEOUS  CORONARY STENT INTERVENTION (PCI-S);  Surgeon: Micheline Chapman, MD;  Location: [facility] CATH LAB;  Service: Cardiovascular;;  Distal RCA   S/P Hysterectomy       Family History  Problem Relation Age of Onset   Hypertension Sister    Diabetes Mellitus II Sister    Hypertension Brother    Diabetes Mellitus II Brother    Hypertension Brother    Diabetes Mellitus II Brother    Hypertension Brother    Diabetes Mellitus II Brother    Colon cancer Neg Hx      Social History   Socioeconomic History   Marital status: Single    Spouse name: Not on file   Number of children: 1   Years of education: Not on file   Highest education level: Not on file  Occupational History   Occupation: Retired    Comment: Clinical biochemist rep  Tobacco Use   Smoking status: Every Day    Packs/day: 0.50    Years: 50.00    Additional pack years: 0.00    Total pack years: 25.00    Types: Cigarettes    Start date: 02/26/1962   Smokeless tobacco: Never   Tobacco comments:    down to less than 1/2 ppd  Vaping Use   Vaping Use: Never used  Substance and Sexual Activity   Alcohol use: No    Alcohol/week: 0.0 standard drinks of alcohol   Drug use: No   Sexual activity: Never    Birth control/protection: None  Other Topics Concern   Not on file  Social History Narrative   Not on file   Social Determinants of Health   Financial Resource Strain: Not on file  Food Insecurity: Not on file  Transportation Needs: Not on file  Physical Activity: Not on file  Stress: Not on file  Social Connections: Not on file  Intimate Partner Violence: Not on file     BP 106/62   Pulse 63   Ht 5' 2.5" (1.588 m)   Wt 125 lb (56.7 kg)   SpO2 96%   BMI 22.50 kg/m   Physical Exam:  Well appearing NAD HEENT: Unremarkable Neck:  No JVD, no thyromegally Lymphatics:  No adenopathy Back:  No CVA tenderness Lungs:  Clear with no wheezes HEART:  Regular rate rhythm, no murmurs, no rubs, no clicks Abd:  soft,  positive bowel sounds, no organomegally, no rebound, no guarding Ext:  2 plus pulses, no edema, no cyanosis, no clubbing Skin:  No rashes no nodules Neuro:  CN II through XII intact, motor grossly intact  DEVICE  Normal device function.  See PaceArt for details.   Assess/Plan: 1. Chronic systolic heart failure - her symptoms are class 2. No change in her meds today. She will continue a low sodium diet. 2. ICD - her Medtronic BiV ICD is working normally s/p gen change out.  3. CAD - she  denies anginal symptoms.  4. Tobacco abuse - she is encouraged to stop smoking.   Sharlot Gowda Shannan Garfinkel,MD

## 2023-03-11 NOTE — Patient Instructions (Signed)
Medication Instructions:  Your physician recommends that you continue on your current medications as directed. Please refer to the Current Medication list given to you today.  *If you need a refill on your cardiac medications before your next appointment, please call your pharmacy*   Lab Work: NONE   If you have labs (blood work) drawn today and your tests are completely normal, you will receive your results only by: MyChart Message (if you have MyChart) OR A paper copy in the mail If you have any lab test that is abnormal or we need to change your treatment, we will call you to review the results.   Testing/Procedures: NONE    Follow-Up: At [facility], you and your health needs are our priority.  As part of our continuing mission to provide you with exceptional heart care, we have created designated Provider Care Teams.  These Care Teams include your primary Cardiologist (physician) and Advanced Practice Providers (APPs -  Physician Assistants and Nurse Practitioners) who all work together to provide you with the care you need, when you need it.  We recommend signing up for the patient portal called "MyChart".  Sign up information is provided on this After Visit Summary.  MyChart is used to connect with patients for Virtual Visits (Telemedicine).  Patients are able to view lab/test results, encounter notes, upcoming appointments, etc.  Non-urgent messages can be sent to your provider as well.   To learn more about what you can do with MyChart, go to [email or web address].    Your next appointment:   1 year(s)  Provider:   Gregg Taylor, MD    Other Instructions Thank you for choosing [facility]!    

## 2023-03-23 DIAGNOSIS — M2012 Hallux valgus (acquired), left foot: Secondary | ICD-10-CM | POA: Diagnosis not present

## 2023-03-23 DIAGNOSIS — M79675 Pain in left toe(s): Secondary | ICD-10-CM | POA: Diagnosis not present

## 2023-03-25 ENCOUNTER — Ambulatory Visit (INDEPENDENT_AMBULATORY_CARE_PROVIDER_SITE_OTHER): Payer: Medicare Other

## 2023-03-25 DIAGNOSIS — I5022 Chronic systolic (congestive) heart failure: Secondary | ICD-10-CM

## 2023-03-25 DIAGNOSIS — I255 Ischemic cardiomyopathy: Secondary | ICD-10-CM

## 2023-03-31 LAB — CUP PACEART REMOTE DEVICE CHECK
Battery Remaining Longevity: 94 mo
Battery Voltage: 3.02 V
Brady Statistic AP VP Percent: 87.55 %
Brady Statistic AP VS Percent: 2.05 %
Brady Statistic AS VP Percent: 0.15 %
Brady Statistic AS VS Percent: 10.25 %
Brady Statistic RA Percent Paced: 99.4 %
Brady Statistic RV Percent Paced: 6.79 %
Date Time Interrogation Session: 20240526232314
HighPow Impedance: 61 Ohm
Implantable Lead Connection Status: 753985
Implantable Lead Connection Status: 753985
Implantable Lead Connection Status: 753985
Implantable Lead Implant Date: 20160615
Implantable Lead Implant Date: 20160615
Implantable Lead Implant Date: 20160615
Implantable Lead Location: 753858
Implantable Lead Location: 753859
Implantable Lead Location: 753860
Implantable Lead Model: 4398
Implantable Lead Model: 5076
Implantable Pulse Generator Implant Date: 20240219
Lead Channel Impedance Value: 1083 Ohm
Lead Channel Impedance Value: 1102 Ohm
Lead Channel Impedance Value: 1159 Ohm
Lead Channel Impedance Value: 380 Ohm
Lead Channel Impedance Value: 399 Ohm
Lead Channel Impedance Value: 399 Ohm
Lead Channel Impedance Value: 399 Ohm
Lead Channel Impedance Value: 437 Ohm
Lead Channel Impedance Value: 456 Ohm
Lead Channel Impedance Value: 494 Ohm
Lead Channel Impedance Value: 703 Ohm
Lead Channel Impedance Value: 703 Ohm
Lead Channel Impedance Value: 760 Ohm
Lead Channel Pacing Threshold Amplitude: 0.625 V
Lead Channel Pacing Threshold Amplitude: 0.75 V
Lead Channel Pacing Threshold Amplitude: 0.875 V
Lead Channel Pacing Threshold Pulse Width: 0.4 ms
Lead Channel Pacing Threshold Pulse Width: 0.4 ms
Lead Channel Pacing Threshold Pulse Width: 0.6 ms
Lead Channel Sensing Intrinsic Amplitude: 1.6 mV
Lead Channel Sensing Intrinsic Amplitude: 13.1 mV
Lead Channel Setting Pacing Amplitude: 1.5 V
Lead Channel Setting Pacing Amplitude: 2 V
Lead Channel Setting Pacing Amplitude: 2.5 V
Lead Channel Setting Pacing Pulse Width: 0.4 ms
Lead Channel Setting Pacing Pulse Width: 0.6 ms
Lead Channel Setting Sensing Sensitivity: 0.45 mV
Zone Setting Status: 755011
Zone Setting Status: 755011

## 2023-04-08 DIAGNOSIS — Z961 Presence of intraocular lens: Secondary | ICD-10-CM | POA: Diagnosis not present

## 2023-04-09 ENCOUNTER — Other Ambulatory Visit: Payer: Self-pay | Admitting: Cardiology

## 2023-04-13 ENCOUNTER — Ambulatory Visit: Payer: Medicare Other | Attending: Internal Medicine

## 2023-04-13 DIAGNOSIS — Z95810 Presence of automatic (implantable) cardiac defibrillator: Secondary | ICD-10-CM

## 2023-04-13 DIAGNOSIS — I5022 Chronic systolic (congestive) heart failure: Secondary | ICD-10-CM

## 2023-04-13 NOTE — Progress Notes (Signed)
EPIC Encounter for ICM Monitoring  Patient Name: Nicole Clements is a 82 y.o. female Date: 04/13/2023 Primary Care Physican: Lupita Raider, MD Primary Cardiologist: Wyline Mood Electrophysiologist: Rae Roam Pacing: 87.1%    02/04/2023 Weight: 138 lbs 03/10/2023 Weight: 138 lbs   Clinical Status (29-Mar-2023 to 12-Apr-2023) Time in AT/AF  <0.1 hours/day (<0.1 %)     Spoke with patient and heart failure questions reviewed.  Pt asymptomatic for fluid accumulation.  Reports feeling well at this time and voices no complaints.    No fluid symptoms during decreased impedance.    Diet:  Does not limit salt intake and eats snacks such as potato chips frequently.   Optivol thoracic impedance suggesting possible fluid accumulation starting 5/11.   Prescribed:   Furosemide 20 mg Take 20 mg daily. You may take an extra 20 mg daily as needed for swelling.   Potassium 20 mEq take 1 tablet daily Spironolactone 25 mg take 0.5 tablet (12.5 mg total) twice a day   Labs:  12/12/2022 Creatinine 1.47, BUN 16, Potassium 4.1, Sodium 133, GFR 36 A complete set of results can be found in Results Review.   Recommendations:   Encouraged to limit salt intake.  Copy to Dr Wyline Mood as Lorain Childes.     Follow-up plan: ICM clinic phone appointment on 04/24/2023 to recheck fluid levels.  91 day device clinic remote scheduled 06/24/2023   EP/Cardiology Office Visits:    04/27/2023 with Dr Wyline Mood.   Recall 03/05/2024 with Dr Ladona Ridgel.   Copy of ICM check sent to Dr. Ladona Ridgel.   3 month ICM trend: 04/13/2023.    12-14 Month ICM trend:     Karie Soda, RN 04/13/2023 4:18 PM

## 2023-04-16 NOTE — Progress Notes (Signed)
Remote ICD transmission.   

## 2023-04-27 ENCOUNTER — Ambulatory Visit: Payer: Medicare Other | Attending: Cardiology | Admitting: Cardiology

## 2023-04-27 ENCOUNTER — Encounter: Payer: Self-pay | Admitting: *Deleted

## 2023-04-27 ENCOUNTER — Ambulatory Visit (INDEPENDENT_AMBULATORY_CARE_PROVIDER_SITE_OTHER): Payer: Medicare Other

## 2023-04-27 VITALS — BP 110/58 | HR 90 | Ht 62.5 in | Wt 128.4 lb

## 2023-04-27 DIAGNOSIS — I5022 Chronic systolic (congestive) heart failure: Secondary | ICD-10-CM | POA: Diagnosis not present

## 2023-04-27 DIAGNOSIS — E782 Mixed hyperlipidemia: Secondary | ICD-10-CM | POA: Diagnosis not present

## 2023-04-27 DIAGNOSIS — Z95810 Presence of automatic (implantable) cardiac defibrillator: Secondary | ICD-10-CM

## 2023-04-27 DIAGNOSIS — I2510 Atherosclerotic heart disease of native coronary artery without angina pectoris: Secondary | ICD-10-CM | POA: Insufficient documentation

## 2023-04-27 DIAGNOSIS — N1832 Chronic kidney disease, stage 3b: Secondary | ICD-10-CM | POA: Diagnosis not present

## 2023-04-27 NOTE — Progress Notes (Signed)
Clinical Summary Nicole Clements is a 82 y.o.female seen today for follow up of the following medical problems.    1. 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 Admit 07/2014 with CHF and troponin elevation - cath 07/2014, LM 30% distal, LAD mid 50%, LCX ostial 50% and mid 50%, small intermediate Arling Cerone 70%. RCA diffuse 50%, distal RCA with ulcerated 90% lesion, PDA 80-90% too small for PCI. LVEF 30%. Received DES to distal RCA, post procedure the PDA became occluded.   - echo 07/2014 LVEF 20%, inferior akinesis, grade II diastolic dysfunction. Repeat echo 10/2014 LVEF remains 15-20%.   - 04/2015 CRT-D device placed by Dr Graciela Husbands. Normal function Jan 2018.  10/2015 echo LVEF 25-30%  - could not afford entresto, back on losartan.     04/2021 echo LVEF 25-30% - no SOB/no DOE, no chest pains - compliant with meds - 03/2023 device check normal device function     2. Hyperlipidemia  - atorva was too expensive, changed to pravastatin by pcp   10/2020 TC 104 TG 73 HDL 40 LDL 64   -labs followed by pcp, will request most recent results   3. CKD III - followed by pcp  Past Medical History:  Diagnosis Date   AICD (automatic cardioverter/defibrillator) present    CAD (coronary artery disease) April 2014   DES to PLA, occluded PDA 07/2014   CHF (congestive heart failure) (HCC)    Hypercholesterolemia    Hypertension    Ischemic cardiomyopathy    LVEF 25%-45%   IVCD (intraventricular conduction defect)    PVC's (premature ventricular contractions)    S/P colonoscopy August 2007   Hyperplastic polyps, rare sigmoid and descending colon diverticulosis, small internal hemorrhoids   STEMI (ST elevation myocardial infarction) (HCC) 07/22/1996     Allergies  Allergen Reactions   Lactose Intolerance (Gi) Other (See Comments)    GI Upset    Sulfa Antibiotics Rash     Current Outpatient Medications  Medication Sig Dispense Refill   acetaminophen (TYLENOL) 325 MG tablet Take by  mouth.     aspirin EC 81 MG tablet Take 1 tablet (81 mg total) by mouth daily. 90 tablet 3   carvedilol (COREG) 25 MG tablet TAKE 1 TABLET BY MOUTH TWICE DAILY 60 tablet 6   Cholecalciferol (VITAMIN D3) 2000 UNITS capsule Take 2,000 Units by mouth daily.     furosemide (LASIX) 20 MG tablet TAKE 1 TABLET BY MOUTH EVERY DAY. MAY TAKE AN ADDITIONAL TABLET EVERY DAY AS NEEDED FOR SWELLING. 180 tablet 1   losartan (COZAAR) 25 MG tablet TAKE 1 TABLET(25 MG) BY MOUTH DAILY 90 tablet 3   nitroGLYCERIN (NITROSTAT) 0.4 MG SL tablet Place 1 tablet (0.4 mg total) under the tongue every 5 (five) minutes x 3 doses as needed for chest pain. 25 tablet 2   Omega-3 Fatty Acids (FISH OIL) 1200 MG CAPS Take 1,200 mg by mouth daily.     potassium chloride SA (KLOR-CON M) 20 MEQ tablet TAKE 1 TABLET(20 MEQ) BY MOUTH DAILY 90 tablet 2   pravastatin (PRAVACHOL) 80 MG tablet Take 80 mg by mouth daily.     spironolactone (ALDACTONE) 25 MG tablet TAKE 1/2 TABLET(12.5 MG) BY MOUTH TWICE DAILY 90 tablet 3   No current facility-administered medications for this visit.     Past Surgical History:  Procedure Laterality Date   ABDOMINAL HYSTERECTOMY     BIV ICD GENERATOR CHANGEOUT N/A 12/22/2022   Procedure: BIV ICD GENERATOR CHANGEOUT;  Surgeon: Marinus Maw, MD;  Location: [facility] INVASIVE CV LAB;  Service: Cardiovascular;  Laterality: N/A;   CARDIAC CATHETERIZATION  April 2014   Med Rx   CATARACT EXTRACTION W/PHACO Right 09/15/2017   Procedure: CATARACT EXTRACTION PHACO AND INTRAOCULAR LENS PLACEMENT (IOC);  Surgeon: Jethro Bolus, MD;  Location: [facility] ORS;  Service: Ophthalmology;  Laterality: Right;  CDE: 12.22   CATARACT EXTRACTION W/PHACO Left 09/29/2017   Procedure: CATARACT EXTRACTION PHACO AND INTRAOCULAR LENS PLACEMENT (IOC);  Surgeon: Jethro Bolus, MD;  Location: [facility];  Service: Ophthalmology;  Laterality: Left;  CDE: 10.02   COLONOSCOPY N/A 12/24/2015   Dr. Fields:moderate sized internal hemorrhoids/moderate  diverticulosis, negative microscopic colitis    EP IMPLANTABLE DEVICE  04/18/2015   BV ICD   EP IMPLANTABLE DEVICE N/A 04/18/2015   Procedure: BiV ICD Insertion CRT-D;  Surgeon: Duke Salvia, MD;  Location: [facility] INVASIVE CV LAB;  Service: Cardiovascular;  Laterality: N/A;   LEFT AND RIGHT HEART CATHETERIZATION WITH CORONARY ANGIOGRAM N/A 07/17/2014   Procedure: LEFT AND RIGHT HEART CATHETERIZATION WITH CORONARY ANGIOGRAM;  Surgeon: Micheline Chapman, MD;  Location: [facility] CATH LAB;  Service: Cardiovascular;  Laterality: N/A;   PERCUTANEOUS CORONARY STENT INTERVENTION (PCI-S)  07/17/2014   Procedure: PERCUTANEOUS CORONARY STENT INTERVENTION (PCI-S);  Surgeon: Micheline Chapman, MD;  Location: [facility] CATH LAB;  Service: Cardiovascular;;  Distal RCA   S/P Hysterectomy       Allergies  Allergen Reactions   Lactose Intolerance (Gi) Other (See Comments)    GI Upset    Sulfa Antibiotics Rash      Family History  Problem Relation Age of Onset   Hypertension Sister    Diabetes Mellitus II Sister    Hypertension Brother    Diabetes Mellitus II Brother    Hypertension Brother    Diabetes Mellitus II Brother    Hypertension Brother    Diabetes Mellitus II Brother    Colon cancer Neg Hx      Social History Nicole Clements reports that she has been smoking cigarettes. She started smoking about 61 years ago. She has a 25.00 pack-year smoking history. She has never used smokeless tobacco. Nicole Clements reports no history of alcohol use.   Review of Systems CONSTITUTIONAL: No weight loss, fever, chills, weakness or fatigue.  HEENT: Eyes: No visual loss, blurred vision, double vision or yellow sclerae.No hearing loss, sneezing, congestion, runny nose or sore throat.  SKIN: No rash or itching.  CARDIOVASCULAR: per hpi RESPIRATORY: No shortness of breath, cough or sputum.  GASTROINTESTINAL: No anorexia, nausea, vomiting or diarrhea. No abdominal pain or blood.  GENITOURINARY: No burning on urination, no  polyuria NEUROLOGICAL: No headache, dizziness, syncope, paralysis, ataxia, numbness or tingling in the extremities. No change in bowel or bladder control.  MUSCULOSKELETAL: No muscle, back pain, joint pain or stiffness.  LYMPHATICS: No enlarged nodes. No history of splenectomy.  PSYCHIATRIC: No history of depression or anxiety.  ENDOCRINOLOGIC: No reports of sweating, cold or heat intolerance. No polyuria or polydipsia.  [name]   Physical Examination Today's Vitals   04/27/23 1327  BP: (!) 110/58  Pulse: 90  SpO2: 95%  Weight: 128 lb 6.4 oz (58.2 kg)  Height: 5' 2.5" (1.588 m)   Body mass index is 23.11 kg/m.  Gen: resting comfortably, no acute distress HEENT: no scleral icterus, pupils equal round and reactive, no palptable cervical adenopathy,  CV: RRR, no m/rg, no jvd Resp: Clear to auscultation bilaterally GI: abdomen is soft, non-tender, non-distended, normal bowel  sounds, no hepatosplenomegaly MSK: extremities are warm, no edema.  Skin: warm, no rash Neuro:  no focal deficits Psych: appropriate affect   Diagnostic Studies  02/2013 Cath   Procedural Findings:   Hemodynamics:   AO 116/45 with a mean of 70   LV 120/18   Coronary angiography:   Coronary dominance: right   Left mainstem: The left main is patent with 30% distal left main stenosis as the vessel trifurcates into the LAD, intermediate Gratia Disla, and left circumflex.   Left anterior descending (LAD): The LAD is patent with diffuse disease noted. There is minimal calcification present. The first diagonal is patent with 30-50% diffuse disease. The LAD after the first septal perforator has 40-50% stenosis. The vessel reaches the apex and wraps around the left ventricular apex without significant stenosis.   Left circumflex (LCx): The intermediate Pola Furno is small in caliber and diffusely diseased with 50-60% proximal vessel stenosis the AV groove circumflex is diffusely diseased with 50% ostial stenosis and 30% mid stenosis  it supplies 2 small posterolateral Hadriel Northup   Right coronary artery (RCA): The RCA is diffusely diseased. There is significant tortuosity, especially around the junction of the mid and distal vessel. The vessel is diffusely calcified. The mid vessel has tandem 50% stenoses. The distal vessel has 80-90 % stenosis just before the bifurcation of the PDA and posterior AV segment. The PDA and posterior AV segment with 3 posterolateral branches are patent.   Left ventriculography: Left ventricular systolic function is moderately depressed. There is severe hypokinesis of the basal and midinferior walls. The anterolateral and apical walls contract normally. The left ventricular ejection fraction is estimated at 45%.   Abdominal aortogram: The abdominal aorta is patent. The renal arteries are single bilaterally and they are widely patent. There appears to be nonobstructive stenosis of the right common iliac artery at the aortoiliac junction. The left common iliac artery appears to have significant ostial stenosis estimated at at least 75% angiographically. The visualized portions of the external iliac and common femoral arteries are patent   Final Conclusions:  1. Severe single-vessel coronary artery disease involving the distal right coronary artery   2. Moderate LV dysfunction with segmental severe hypokinesis of the inferior wall and normal contraction of the antero-apex with estimated left ventricular ejection fraction of 45%.   3. Severe left common iliac artery stenosis   Recommendations: The patient has severe distal RCA disease. She has a known history of inferior wall infarction and remote PCA. This fits with that clinical history. She has nonobstructive disease of the left coronary artery. Her left ventricular ejection fraction by ventriculography is clearly greater than 35%. With her history of previous MI, I do not think PCI is indicated with an absence of angina. Will carefully discussed symptoms with the  patient, but I am inclined to treat her medically. Will also review indication for consideration of peripheral intervention for her iliac disease.       01/2013 Echo   Study Conclusions  - Left ventricle: The cavity size was mildly to moderately dilated. Wall thickness was normal. Systolic function was severely reduced. The estimated ejection fraction was 25%. Severe diffuse hypokinesis. - Aortic valve: Mildly calcified annulus. Trileaflet. - Mitral valve: Calcified annulus. - Left atrium: The atrium was moderately dilated. - Right ventricle: The cavity size was normal. Wall thickness was mildly to moderately increased. - Right atrium: The atrium was mildly dilated. - Atrial septum: No defect or patent foramen ovale was identified. Impressions:  - Compared  to the prior study of 03/13/11, there has been substantial deterioration in LV systolic function.   07/2014 Echo Study Conclusions  - Left ventricle: The cavity size was severely dilated. Wall thickness was normal. The estimated ejection fraction was 20%. Diffuse hypokinesis. There is akinesis of the inferolateral and inferior myocardium. Features are consistent with a pseudonormal left ventricular filling pattern, with concomitant abnormal relaxation and increased filling pressure (grade 2 diastolic dysfunction). Doppler parameters are consistent with high ventricular filling pressure. - Mitral valve: There was mild to moderate regurgitation. - Left atrium: The atrium was moderately dilated.  Impressions:  - Global hypokinesis; inferior and inferolateral akinesis; overall severely reduced LV function; grade 2 diastolic dysfunction with elevated left ventricular filling pressures, moderate LAE; mild to moderate MR.  07/2014 Cath PROCEDURAL FINDINGS   Hemodynamics:   AO 122/48   LV 118/14   RA 9   RV 41/11   PA 37/5 with a mean of 21   PCWP 27   Oxygen saturations   AO 90   PA 56   Cardiac output 3.5   Cardiac  index 2.0   Coronary angiography:   Coronary dominance: right   Left mainstem: The left mainstem is patent with 30% distal left main stenosis.   Left anterior descending (LAD): The LAD is diffusely diseased in the midportion. There are sequential 50% stenoses, unchanged from previous study. The first diagonal Kida Digiulio is large in distribution with diffuse nonobstructive disease noted. The mid and distal LAD are patent without significant disease.   Left circumflex (LCx): The left circumflex has 50% ostial stenosis, 50% mid vessel stenosis, and to OM branches with no significant disease. There is an intermediate Levander Katzenstein is diffusely diseased with up to 70% stenosis. This vessel is small in caliber.   Right coronary artery (RCA): Dominant vessel. Tortuous with diffuse nonobstructive stenosis throughout the proximal and mid portions. There are diffuse 50% stenosis present. The distal vessel has an ulcerated-appearing irregular 90% stenosis involving the bifurcation of the PDA and posterolateral branches. The posterolateral branches are patent with mild diffuse disease. The PDA arises an acute angulation with 30-40% stenosis. The mid body of the PDA has an 80-90% stenosis in an area where the vessel is too small for PCI.   Left ventriculography: The ventricle appears the synchronous. The basal and midinferior wall are akinetic. The basal inferolateral wall is severely hypokinetic. The anterolateral wall and apex contract normally. The estimated LVEF is 30%.   PCI Procedure Note: Following the diagnostic procedure, the decision was made to proceed with PCI of the distal RCA. The lesion is complex, there is a large amount of myocardium involved. I felt there was a risk of potentially compromising the PDA Lashonna Rieke, but overall in my opinion the risk/benefit was favorable for PCI considering the severe stenosis involving the entire RCA distribution. The sheath was upsized to a 6 [place]. Weight-based bivalirudin was given  for anticoagulation. The patient was loaded with Plavix 600 mg on the table. Once a therapeutic ACT was achieved, a 6 [place] JR 4 guide catheter was inserted. A cougar coronary guidewire was used to cross the lesion into the PLA Savera Donson. The lesion was predilated with a 2.5 x 15 mm balloon. The lesion was then stented with a 3.0 by 16mm Promus DES stent. The stent was postdilated with a 3.25 mm noncompliant balloon to 18 atmospheres. The PDA was stented across as the stent extended from the distal RCA proximal PLA Tynleigh Birt. The PDA was totally occluded. I  made a long attempt with a whisper wire using multiple bands on the tip of the wire, but all of these attempts were unsuccessful at rewiring the PDA. The patient was completely asymptomatic and specifically denied any chest pain. Her hemodynamics remained stable and there were no changes in her rhythm or blood pressure. A final image of the left coronary artery was obtained and this demonstrated a collateral from the LAD to the PDA Teriann Livingood. Following PCI, there was 0% residual stenosis and TIMI-3 flow. Final angiography confirmed an excellent result. Femoral hemostasis was achieved will be achieved with manual hemostasis. The patient tolerated the PCI procedure well. There were no immediate procedural complications. The patient was transferred to the post catheterization recovery area for further monitoring.   PCI Data:   Vessel - RCA/Segment - distal   Percent Stenosis (pre) 90   TIMI-flow 3   Stent 3.0 by 16mm Promus DES   Percent Stenosis (post) 0   TIMI-flow (post) 3   Contrast: 175 cc Omnipaque   Radiation dose/Fluoro time: 15.5 minutes   Estimated Blood Loss: Minimal   Final Conclusions:  1. Nonobstructive disease involving the LAD, left circumflex, and OM/diagonal side branches   2. Severe stenosis of the distal RCA bifurcation, treated successfully with a drug-eluting stent into the PLA Kaydie Petsch with residual occlusion of the PDA Damilola Flamm   3. Severe  segmental LV systolic dysfunction   Recommendations: Dual antiplatelet therapy with aspirin and Plavix for at least 12 months. Will cycle cardiac enzymes since the PDA Nedim Oki is occluded after stenting.   10/2014 Echo Study Conclusions  - Procedure narrative: Transthoracic echocardiography. Image   quality was suboptimal. The study was technically difficult, as a   result of poor sound wave transmission and body habitus. - Left ventricle: Systolic function is severely reduced, estimated   EF 15-20%. The cavity size was severely dilated. Doppler   parameters are consistent with abnormal left ventricular   relaxation (grade 1 diastolic dysfunction). Doppler parameters   are consistent with high ventricular filling pressure. Medial   E/e&' 24. - Regional wall motion abnormality: Akinesis of the basal-mid   inferior and basal inferolateral myocardium; severe hypokinesis   of the apical inferior and mid inferolateral myocardium. Severe,   diffuse hypokinesis. - Ventricular septum: Septal motion showed abnormal function and   dyssynergy. These changes are consistent with intraventricular   conduction delay. - Aortic valve: Mildly calcified annulus. Trileaflet; mildly   thickened leaflets. - Mitral valve: Mildly dilated annulus. Mildly thickened leaflets .   There was moderate eccentric regurgitation. Likely functional due   to mitral annular dilatation. - Left atrium: The atrium was severely dilated. Volume/bsa, S: 47.1   ml/m^2. - Right ventricle: Systolic function was mildly reduced. - Right atrium: The atrium was mildly dilated. - Atrial septum: There was increased thickness of the septum,   consistent with lipomatous hypertrophy. - Tricuspid valve: There was mild regurgitation. - Pulmonary arteries: PA peak pressure: 42 mm Hg (S). Mildly   elevated pulmonary pressures. - Pericardium, extracardiac: There was a small pericardial   effusion, with no evidence of tamponade physiology.    04/2015 Carotid US IMPRESSION: 1. Mild (1-49%) stenosis of the proximal right internal carotid artery secondary to smooth heterogeneous atherosclerotic plaque. No interval change compared to recent prior imaging from earlier this month. 2. Mild (1-49%) stenosis of the proximal left internal carotid artery secondary to heterogeneous and irregular atherosclerotic plaque. No interval change compared to recent prior imaging from earlier this month.  3. Vertebral arteries remain patent with normal antegrade flow. Signed,       10/2015 echo Study Conclusions  - Left ventricle: The cavity size was moderately to severely   dilated. Wall thickness was normal. Systolic function was   severely reduced. The estimated ejection fraction was in the   range of 25% to 30%. Diffuse hypokinesis. There is akinesis of   the basalanteroseptal and anterior myocardium. Doppler parameters   are consistent with abnormal left ventricular relaxation (grade 1   diastolic dysfunction). - Mitral valve: Calcified annulus. Mildly calcified leaflets .   There was mild regurgitation. - Right atrium: Central venous pressure (est): 3 mm Hg. - Atrial septum: No defect or patent foramen ovale was identified. - Tricuspid valve: There was trivial regurgitation. - Pulmonary arteries: PA peak pressure: 27 mm Hg (S). - Pericardium, extracardiac: A prominent pericardial fat pad was   present.  Impressions:  - Moderate to severe LV chamber dilatation with LVEF approximately   25-30%. There is diffuse hypokinesis with near akinesis of the   basal anteroseptal and anterior myocardium. Compared to the   previous study from June 2016 there has been some improvement in   LVEF. Grade 1 diastolic dysfunction. MAC with mild mitral   regurgitation. Trivial tricuspid regurgitation with PASP 27 mmHg.  Transthoracic echocardiography.  M-mode, complete 2D, spectral Doppler, and color Doppler.  Birthdate:  Patient  birthdate: [birth year].  Age:  Patient is 82 yr old.  Sex:  Gender: female. BMI: 27.5 kg/m^2.  Blood pressure:     98/47  Patient status: Inpatient.  Study date:  Study date: 10/04/2015. Study time: 11:27 AM.  Location:  Echo laboratory     04/2021 echo IMPRESSIONS     1. Left ventricular ejection fraction, by estimation, is 25 to 30%. The  left ventricle has severely decreased function. The left ventricle  demonstrates global hypokinesis. The left ventricular internal cavity size  was moderately dilated. Left  ventricular diastolic parameters are consistent with Grade I diastolic  dysfunction (impaired relaxation). The average left ventricular global  longitudinal strain is -10.3 %. The global longitudinal strain is  abnormal.   2. Right ventricular systolic function is normal. The right ventricular  size is normal.   3. A small pericardial effusion is present. The pericardial effusion is  localized near the right atrium.   4. The mitral valve is normal in structure. Mild to moderate mitral valve  regurgitation. No evidence of mitral stenosis.   5. The aortic valve has an indeterminant number of cusps. Aortic valve  regurgitation is not visualized. No aortic stenosis is present.        Assessment and Plan   1. CAD - no recent symptoms, we will continue curren tmeds   2. Chronic systolic HF - Medical therpay limited by soft bp's and renal insufficiency.Entresto and farxiga were too expensive - no recent symptoms, continue current meds   3. Hyperlipidemia - statin changed previously due to cost, -request pcp labs   4. CKD 3B - limit nephrotoxic drugs - continue to monitor       Antoine Poche, M.D.

## 2023-04-27 NOTE — Patient Instructions (Signed)
Medication Instructions:  Continue all current medications.   Labwork: none  Testing/Procedures: none  Follow-Up: 6 months   Any Other Special Instructions Will Be Listed Below (If Applicable).   If you need a refill on your cardiac medications before your next appointment, please call your pharmacy.  

## 2023-05-01 NOTE — Progress Notes (Signed)
EPIC Encounter for ICM Monitoring  Patient Name: Nicole Clements is a 82 y.o. female Date: 05/01/2023 Primary Care Physican: Lupita Raider, MD Primary Cardiologist: Wyline Mood Electrophysiologist: Rae Roam Pacing: 89.4%    02/04/2023 Weight: 138 lbs 03/10/2023 Weight: 138 lbs   Clinical Status Since 12-Apr-2023 Time in AT/AF   0.0 hours/day (0.0 %)     Pt seen by Dr Wyline Mood 6/24 and no fluid symptoms were noted in office notes.     Diet:  Does not limit salt intake and eats snacks such as potato chips frequently.    Optivol thoracic impedance suggesting possible fluid accumulation starting 5/11.   Prescribed:   Furosemide 20 mg Take 20 mg daily. You may take an extra 20 mg daily as needed for swelling.   Potassium 20 mEq take 1 tablet daily Spironolactone 25 mg take 0.5 tablet (12.5 mg total) twice a day   Labs:  12/12/2022 Creatinine 1.47, BUN 16, Potassium 4.1, Sodium 133, GFR 36 A complete set of results can be found in Results Review.   Recommendations:  No changes   Follow-up plan: ICM clinic phone appointment on 05/18/2023.  91 day device clinic remote scheduled 06/24/2023   EP/Cardiology Office Visits:    11/02/2023 with Dr Wyline Mood.   Recall 03/05/2024 with Dr Ladona Ridgel.   Copy of ICM check sent to Dr. Ladona Ridgel.   3 month ICM trend: 04/30/2023.    12-14 Month ICM trend:     Karie Soda, RN 05/01/2023 1:02 PM

## 2023-05-18 ENCOUNTER — Ambulatory Visit: Payer: Medicare Other | Attending: Internal Medicine

## 2023-05-18 DIAGNOSIS — I5022 Chronic systolic (congestive) heart failure: Secondary | ICD-10-CM

## 2023-05-18 DIAGNOSIS — Z95810 Presence of automatic (implantable) cardiac defibrillator: Secondary | ICD-10-CM

## 2023-05-19 NOTE — Progress Notes (Signed)
EPIC Encounter for ICM Monitoring  Patient Name: Nicole Clements is a 82 y.o. female Date: 05/19/2023 Primary Care Physican: Lupita Raider, MD Primary Cardiologist: Wyline Mood Electrophysiologist: Rae Roam Pacing: 88.6%   Effective 73.3% 02/04/2023 Weight: 138 lbs 03/10/2023 Weight: 138 lbs   Clinical Status  Since 30-Apr-2023 Time in AT/AF    0.0 hours/day (0.0 %)     Spoke with patient and heart failure questions reviewed.  Transmission results reviewed.  Pt asymptomatic for fluid accumulation.  Reports feeling well at this time and voices no complaints.     Diet:  Does not limit salt intake and eats snacks such as potato chips frequently.    Optivol thoracic impedance suggesting ongoing possible fluid accumulation since 5/11.  Fluid index > normal threshold since 5/20   Prescribed:   Furosemide 20 mg Take 20 mg daily. You may take an extra 20 mg daily as needed for swelling.   Potassium 20 mEq take 1 tablet daily Spironolactone 25 mg take 0.5 tablet (12.5 mg total) twice a day   Labs:  12/12/2022 Creatinine 1.47, BUN 16, Potassium 4.1, Sodium 133, GFR 36 A complete set of results can be found in Results Review.   Recommendations:  Advised to take 2 Furosemide tablets daily x 2 days and then return to 1 a day.    Follow-up plan: ICM clinic phone appointment on 05/22/2023 to recheck fluid level.  91 day device clinic remote scheduled 06/24/2023   EP/Cardiology Office Visits:    11/02/2023 with Dr Wyline Mood.   Recall 03/05/2024 with Dr Ladona Ridgel.   Copy of ICM check sent to Dr. Ladona Ridgel.   3 month ICM trend: 05/18/2023.    12-14 Month ICM trend:     Karie Soda, RN 05/19/2023 3:26 PM

## 2023-05-22 ENCOUNTER — Ambulatory Visit: Payer: Medicare Other | Attending: Internal Medicine

## 2023-05-22 DIAGNOSIS — I5022 Chronic systolic (congestive) heart failure: Secondary | ICD-10-CM

## 2023-05-22 DIAGNOSIS — Z95810 Presence of automatic (implantable) cardiac defibrillator: Secondary | ICD-10-CM

## 2023-05-24 NOTE — Progress Notes (Signed)
EPIC Encounter for ICM Monitoring  Patient Name: Nicole Clements is a 82 y.o. female Date: 05/24/2023 Primary Care Physican: Lupita Raider, MD Primary Cardiologist: Wyline Mood Electrophysiologist: Rae Roam Pacing: 87.3%   Effective 70.7% 02/04/2023 Weight: 138 lbs 03/10/2023 Weight: 138 lbs   Clinical Status (17-May-2023 to 21-May-2023) Time in AT/AF    0.0 hours/day (0.0 %)     Transmission results reviewed.     Diet:  Does not limit salt intake and eats snacks such as potato chips frequently.    Optivol thoracic impedance chronically suggesting possible fluid accumulation since 5/11.  Impedance remains below baseline after recommendation to take extra Furosemide x 2 days.    Fluid index > normal threshold since 5/20   Prescribed:   Furosemide 20 mg Take 20 mg daily. You may take an extra 20 mg daily as needed for swelling.   Potassium 20 mEq take 1 tablet daily Spironolactone 25 mg take 0.5 tablet (12.5 mg total) twice a day   Labs:  12/12/2022 Creatinine 1.47, BUN 16, Potassium 4.1, Sodium 133, GFR 36 A complete set of results can be found in Results Review.   Recommendations:  No changes.   Follow-up plan: ICM clinic phone appointment on 06/22/2023.  91 day device clinic remote scheduled 06/24/2023   EP/Cardiology Office Visits:    11/02/2023 with Dr Wyline Mood.   Recall 03/05/2024 with Dr Ladona Ridgel.   Copy of ICM check sent to Dr. Ladona Ridgel.    3 month ICM trend: 05/21/2023.    12-14 Month ICM trend:     Karie Soda, RN 05/24/2023 10:32 AM

## 2023-05-27 NOTE — Progress Notes (Signed)
Spoke with patient and heart failure questions reviewed.  Transmission results reviewed.  Pt confirmed she took extra Furosemide for 2 days.  She feels fine and denies any fluid symptoms.  Reviewed fluid symptoms and advised to call if she experiences any symptoms.

## 2023-06-22 ENCOUNTER — Ambulatory Visit: Payer: Medicare Other | Attending: Internal Medicine

## 2023-06-22 DIAGNOSIS — I5022 Chronic systolic (congestive) heart failure: Secondary | ICD-10-CM | POA: Diagnosis not present

## 2023-06-22 DIAGNOSIS — Z95810 Presence of automatic (implantable) cardiac defibrillator: Secondary | ICD-10-CM | POA: Diagnosis not present

## 2023-06-24 ENCOUNTER — Ambulatory Visit (INDEPENDENT_AMBULATORY_CARE_PROVIDER_SITE_OTHER): Payer: Medicare Other

## 2023-06-24 DIAGNOSIS — I255 Ischemic cardiomyopathy: Secondary | ICD-10-CM

## 2023-06-24 DIAGNOSIS — Z23 Encounter for immunization: Secondary | ICD-10-CM | POA: Diagnosis not present

## 2023-06-24 NOTE — Progress Notes (Signed)
EPIC Encounter for ICM Monitoring  Patient Name: Nicole Clements is a 82 y.o. female Date: 06/24/2023 Primary Care Physican: Lupita Raider, MD Primary Cardiologist: Wyline Mood Electrophysiologist: Rae Roam Pacing: 87.6%   Effective 82.0% 02/04/2023 Weight: 138 lbs 03/10/2023 Weight: 138 lbs    Clinical Status Since 21-May-2023) Time in AT/AF    <0.1 hours/day (<0.1 %) VT-NS (>4 beats, >200 bpm)  3     Spoke with patient and heart failure questions reviewed.  Transmission results reviewed.  Pt asymptomatic for fluid accumulation.  Reports feeling well at this time and voices no complaints.     Diet:  Does not limit salt intake and eats snacks such as potato chips frequently.    Optivol thoracic impedance chronically suggesting possible fluid accumulation since 5/11.  Showing slight improvement since 8/4.     Fluid index > normal threshold since 5/20   Prescribed:   Furosemide 20 mg Take 20 mg daily. You may take an extra 20 mg daily as needed for swelling.   Potassium 20 mEq take 1 tablet daily Spironolactone 25 mg take 0.5 tablet (12.5 mg total) twice a day   Labs:  12/12/2022 Creatinine 1.47, BUN 16, Potassium 4.1, Sodium 133, GFR 36 A complete set of results can be found in Results Review.   Recommendations: Encouraged to limit salt and fluid intake.  No changes and encouraged to call if experiencing any fluid symptoms.   Follow-up plan: ICM clinic phone appointment on 07/27/2023.  91 day device clinic remote scheduled 09/23/2023   EP/Cardiology Office Visits:    11/06/2023 with Dr Wyline Mood.   Recall 03/05/2024 with Dr Ladona Ridgel.   Copy of ICM check sent to Dr. Ladona Ridgel and Dr Wyline Mood.    3 month ICM trend: 06/22/2023.    12-14 Month ICM trend:     Karie Soda, RN 06/24/2023 1:07 PM

## 2023-06-25 LAB — CUP PACEART REMOTE DEVICE CHECK
Battery Remaining Longevity: 91 mo
Battery Voltage: 2.93 V
Brady Statistic RV Percent Paced: 9.86 %
Date Time Interrogation Session: 20240822053924
HighPow Impedance: 51 Ohm
Implantable Lead Connection Status: 753985
Implantable Lead Connection Status: 753985
Implantable Lead Connection Status: 753985
Implantable Lead Implant Date: 20160615
Implantable Lead Implant Date: 20160615
Implantable Lead Implant Date: 20160615
Implantable Lead Location: 753858
Implantable Lead Location: 753859
Implantable Lead Location: 753860
Implantable Lead Model: 4398
Implantable Lead Model: 5076
Implantable Pulse Generator Implant Date: 20240219
Lead Channel Impedance Value: 1026 Ohm
Lead Channel Impedance Value: 1045 Ohm
Lead Channel Impedance Value: 1083 Ohm
Lead Channel Impedance Value: 380 Ohm
Lead Channel Impedance Value: 380 Ohm
Lead Channel Impedance Value: 380 Ohm
Lead Channel Impedance Value: 380 Ohm
Lead Channel Impedance Value: 399 Ohm
Lead Channel Impedance Value: 475 Ohm
Lead Channel Impedance Value: 513 Ohm
Lead Channel Impedance Value: 665 Ohm
Lead Channel Impedance Value: 741 Ohm
Lead Channel Impedance Value: 817 Ohm
Lead Channel Pacing Threshold Amplitude: 0.625 V
Lead Channel Pacing Threshold Amplitude: 0.75 V
Lead Channel Pacing Threshold Amplitude: 0.75 V
Lead Channel Pacing Threshold Pulse Width: 0.4 ms
Lead Channel Pacing Threshold Pulse Width: 0.4 ms
Lead Channel Pacing Threshold Pulse Width: 0.6 ms
Lead Channel Sensing Intrinsic Amplitude: 1.6 mV
Lead Channel Sensing Intrinsic Amplitude: 12.5 mV
Lead Channel Setting Pacing Amplitude: 1.5 V
Lead Channel Setting Pacing Amplitude: 2 V
Lead Channel Setting Pacing Amplitude: 2.5 V
Lead Channel Setting Pacing Pulse Width: 0.4 ms
Lead Channel Setting Pacing Pulse Width: 0.6 ms
Lead Channel Setting Sensing Sensitivity: 0.45 mV
Zone Setting Status: 755011
Zone Setting Status: 755011

## 2023-07-07 ENCOUNTER — Other Ambulatory Visit: Payer: Self-pay | Admitting: Cardiology

## 2023-07-08 NOTE — Progress Notes (Signed)
Remote ICD transmission.   

## 2023-07-13 ENCOUNTER — Other Ambulatory Visit (HOSPITAL_COMMUNITY): Payer: Self-pay | Admitting: Family Medicine

## 2023-07-13 DIAGNOSIS — Z1231 Encounter for screening mammogram for malignant neoplasm of breast: Secondary | ICD-10-CM

## 2023-07-27 ENCOUNTER — Ambulatory Visit: Payer: Medicare Other | Attending: Internal Medicine

## 2023-07-27 DIAGNOSIS — Z95810 Presence of automatic (implantable) cardiac defibrillator: Secondary | ICD-10-CM | POA: Diagnosis not present

## 2023-07-27 DIAGNOSIS — I5022 Chronic systolic (congestive) heart failure: Secondary | ICD-10-CM

## 2023-07-30 NOTE — Progress Notes (Signed)
EPIC Encounter for ICM Monitoring  Patient Name: Nicole Clements is a 82 y.o. female Date: 07/30/2023 Primary Care Physican: Lupita Raider, MD Primary Cardiologist: Wyline Mood Electrophysiologist: Rae Roam Pacing: 96.1%   Effective 94% 02/04/2023 Weight: 138 lbs 03/10/2023 Weight: 138 lbs 07/30/2023 Weight: 138 lbs    Clinical Status Since 25-Jun-2023 Time in AT/AF  0.0 hours/day (0.0 %)     Spoke with patient and heart failure questions reviewed.  Transmission results reviewed.  Pt asymptomatic for fluid accumulation.  Reports feeling well at this time and voices no complaints.  No changes in diet or fluid intake in the last few months.   Diet:  Does not limit salt intake and eats snacks such as potato chips frequently.    Optivol thoracic impedance suggesting fluid levels returned to normal 9/12 after lengthy possible fluid accumulation from 5/11-9/11.   Fluid index returned to normal 9/16.   Prescribed:   Furosemide 20 mg Take 20 mg daily. You may take an extra 20 mg daily as needed for swelling.   Potassium 20 mEq take 1 tablet daily Spironolactone 25 mg take 0.5 tablet (12.5 mg total) twice a day   Labs:  12/12/2022 Creatinine 1.47, BUN 16, Potassium 4.1, Sodium 133, GFR 36 A complete set of results can be found in Results Review.   Recommendations: Encouraged to limit salt and fluid intake.  No changes and encouraged to call if experiencing any fluid symptoms.   Follow-up plan: ICM clinic phone appointment on 08/31/2023.  91 day device clinic remote scheduled 09/23/2023.   EP/Cardiology Office Visits:    11/06/2023 with Dr Wyline Mood.   Recall 03/05/2024 with Dr Ladona Ridgel.   Copy of ICM check sent to Dr. Ladona Ridgel.   3 month ICM trend: 07/27/2023.    12-14 Month ICM trend:     Karie Soda, RN 07/30/2023 12:44 PM

## 2023-08-14 ENCOUNTER — Ambulatory Visit (HOSPITAL_COMMUNITY)
Admission: RE | Admit: 2023-08-14 | Discharge: 2023-08-14 | Disposition: A | Discharge status: Home / Self Care | Payer: Medicare Other | Source: Ambulatory Visit | Attending: Family Medicine | Admitting: Family Medicine

## 2023-08-14 ENCOUNTER — Encounter (HOSPITAL_COMMUNITY): Payer: Self-pay

## 2023-08-14 DIAGNOSIS — Z1231 Encounter for screening mammogram for malignant neoplasm of breast: Secondary | ICD-10-CM | POA: Diagnosis not present

## 2023-08-28 DIAGNOSIS — Z23 Encounter for immunization: Secondary | ICD-10-CM | POA: Diagnosis not present

## 2023-08-31 ENCOUNTER — Ambulatory Visit: Payer: Medicare Other | Attending: Internal Medicine

## 2023-08-31 DIAGNOSIS — I5022 Chronic systolic (congestive) heart failure: Secondary | ICD-10-CM

## 2023-08-31 DIAGNOSIS — Z95810 Presence of automatic (implantable) cardiac defibrillator: Secondary | ICD-10-CM

## 2023-09-04 ENCOUNTER — Telehealth: Payer: Self-pay

## 2023-09-04 NOTE — Telephone Encounter (Signed)
Remote ICM transmission received.  Attempted call to patient regarding ICM remote transmission and left detailed message per DPR.  Left ICM phone number and advised to return call for any fluid symptoms or questions. Next ICM remote transmission scheduled 10/05/2023.

## 2023-09-04 NOTE — Progress Notes (Signed)
EPIC Encounter for ICM Monitoring  Patient Name: Nicole Clements is a 82 y.o. female Date: 09/04/2023 Primary Care Physican: Lupita Raider, MD Primary Cardiologist: Wyline Mood Electrophysiologist: Rae Roam Pacing: 93.5%   Effective 88.5% 02/04/2023 Weight: 138 lbs 03/10/2023 Weight: 138 lbs 07/30/2023 Weight: 138 lbs    Clinical Status Since 25-Jun-2023 Time in AT/AF  0.0 hours/day (0.0 %)     Attempted call to patient and unable to reach.   Transmission reviewed.    Diet:  Typically Does not limit salt intake and eats snacks such as potato chips frequently.    Optivol thoracic impedance suggesting normal fluid levels within the last month.   Prescribed:   Furosemide 20 mg Take 20 mg daily. You may take an extra 20 mg daily as needed for swelling.   Potassium 20 mEq take 1 tablet daily Spironolactone 25 mg take 0.5 tablet (12.5 mg total) twice a day   Labs:  12/12/2022 Creatinine 1.47, BUN 16, Potassium 4.1, Sodium 133, GFR 36 A complete set of results can be found in Results Review.   Recommendations: Unable to reach.     Follow-up plan: ICM clinic phone appointment on 10/05/2023.  91 day device clinic remote scheduled 09/23/2023.   EP/Cardiology Office Visits:  11/06/2023 with Dr Wyline Mood.   Recall 03/05/2024 with Dr Ladona Ridgel.   Copy of ICM check sent to Dr. Ladona Ridgel.   3 month ICM trend: 08/31/2023.    12-14 Month ICM trend:     Karie Soda, RN 09/04/2023 2:24 PM

## 2023-09-23 ENCOUNTER — Ambulatory Visit (INDEPENDENT_AMBULATORY_CARE_PROVIDER_SITE_OTHER): Payer: Medicare Other

## 2023-09-23 DIAGNOSIS — I5022 Chronic systolic (congestive) heart failure: Secondary | ICD-10-CM | POA: Diagnosis not present

## 2023-09-23 DIAGNOSIS — I255 Ischemic cardiomyopathy: Secondary | ICD-10-CM

## 2023-09-23 LAB — CUP PACEART REMOTE DEVICE CHECK
Battery Remaining Longevity: 86 mo
Battery Voltage: 2.99 V
Brady Statistic RV Percent Paced: 5.44 %
Date Time Interrogation Session: 20241120000357
HighPow Impedance: 56 Ohm
Implantable Lead Connection Status: 753985
Implantable Lead Connection Status: 753985
Implantable Lead Connection Status: 753985
Implantable Lead Implant Date: 20160615
Implantable Lead Implant Date: 20160615
Implantable Lead Implant Date: 20160615
Implantable Lead Location: 753858
Implantable Lead Location: 753859
Implantable Lead Location: 753860
Implantable Lead Model: 4398
Implantable Lead Model: 5076
Implantable Pulse Generator Implant Date: 20240219
Lead Channel Impedance Value: 361 Ohm
Lead Channel Impedance Value: 361 Ohm
Lead Channel Impedance Value: 361 Ohm
Lead Channel Impedance Value: 399 Ohm
Lead Channel Impedance Value: 399 Ohm
Lead Channel Impedance Value: 399 Ohm
Lead Channel Impedance Value: 456 Ohm
Lead Channel Impedance Value: 570 Ohm
Lead Channel Impedance Value: 608 Ohm
Lead Channel Impedance Value: 741 Ohm
Lead Channel Impedance Value: 798 Ohm
Lead Channel Impedance Value: 817 Ohm
Lead Channel Impedance Value: 817 Ohm
Lead Channel Pacing Threshold Amplitude: 0.625 V
Lead Channel Pacing Threshold Amplitude: 0.625 V
Lead Channel Pacing Threshold Amplitude: 0.75 V
Lead Channel Pacing Threshold Pulse Width: 0.4 ms
Lead Channel Pacing Threshold Pulse Width: 0.4 ms
Lead Channel Pacing Threshold Pulse Width: 0.6 ms
Lead Channel Sensing Intrinsic Amplitude: 1.6 mV
Lead Channel Sensing Intrinsic Amplitude: 11.1 mV
Lead Channel Setting Pacing Amplitude: 1.5 V
Lead Channel Setting Pacing Amplitude: 2 V
Lead Channel Setting Pacing Amplitude: 2.5 V
Lead Channel Setting Pacing Pulse Width: 0.4 ms
Lead Channel Setting Pacing Pulse Width: 0.6 ms
Lead Channel Setting Sensing Sensitivity: 0.45 mV
Zone Setting Status: 755011
Zone Setting Status: 755011

## 2023-09-30 ENCOUNTER — Other Ambulatory Visit: Payer: Self-pay | Admitting: Cardiology

## 2023-10-05 ENCOUNTER — Ambulatory Visit: Payer: Medicare Other | Attending: Internal Medicine

## 2023-10-05 DIAGNOSIS — Z95810 Presence of automatic (implantable) cardiac defibrillator: Secondary | ICD-10-CM

## 2023-10-05 DIAGNOSIS — I5022 Chronic systolic (congestive) heart failure: Secondary | ICD-10-CM

## 2023-10-13 NOTE — Progress Notes (Signed)
EPIC Encounter for ICM Monitoring  Patient Name: Nicole Clements is a 82 y.o. female Date: 10/13/2023 Primary Care Physican: Lupita Raider, MD Primary Cardiologist: Wyline Mood Electrophysiologist: Rae Roam Pacing: 93.9%  02/04/2023 Weight: 138 lbs 03/10/2023 Weight: 138 lbs 07/30/2023 Weight: 138 lbs    Clinical Status Since 23-Sep-2023 Time in AT/AF  0.0 hours/day (0.0 %)     Attempted call to patient and unable to reach.   Transmission reviewed.    Diet:  Typically Does not limit salt intake and eats snacks such as potato chips frequently.    Optivol thoracic impedance suggesting normal fluid levels since 11/22.   Prescribed:   Furosemide 20 mg Take 20 mg daily. You may take an extra 20 mg daily as needed for swelling.   Potassium 20 mEq take 1 tablet daily Spironolactone 25 mg take 0.5 tablet (12.5 mg total) twice a day   Labs:  12/12/2022 Creatinine 1.47, BUN 16, Potassium 4.1, Sodium 133, GFR 36 A complete set of results can be found in Results Review.   Recommendations: Unable to reach.     Follow-up plan: ICM clinic phone appointment on 11/09/2023.  91 day device clinic remote scheduled 12/23/2023.   EP/Cardiology Office Visits:  11/06/2023 with Dr Wyline Mood.   Recall 03/05/2024 with Dr Ladona Ridgel.   Copy of ICM check sent to Dr. Ladona Ridgel.   3 month ICM trend: 10/05/2023.    12-14 Month ICM trend:     Karie Soda, RN 10/13/2023 2:16 PM

## 2023-10-20 NOTE — Progress Notes (Signed)
Remote ICD transmission.   

## 2023-10-29 DIAGNOSIS — I340 Nonrheumatic mitral (valve) insufficiency: Secondary | ICD-10-CM | POA: Diagnosis not present

## 2023-10-29 DIAGNOSIS — Z950 Presence of cardiac pacemaker: Secondary | ICD-10-CM | POA: Diagnosis not present

## 2023-10-29 DIAGNOSIS — N183 Chronic kidney disease, stage 3 unspecified: Secondary | ICD-10-CM | POA: Diagnosis not present

## 2023-10-29 DIAGNOSIS — Z720 Tobacco use: Secondary | ICD-10-CM | POA: Diagnosis not present

## 2023-10-29 DIAGNOSIS — Z23 Encounter for immunization: Secondary | ICD-10-CM | POA: Diagnosis not present

## 2023-10-29 DIAGNOSIS — I25119 Atherosclerotic heart disease of native coronary artery with unspecified angina pectoris: Secondary | ICD-10-CM | POA: Diagnosis not present

## 2023-10-29 DIAGNOSIS — M8580 Other specified disorders of bone density and structure, unspecified site: Secondary | ICD-10-CM | POA: Diagnosis not present

## 2023-10-29 DIAGNOSIS — I5022 Chronic systolic (congestive) heart failure: Secondary | ICD-10-CM | POA: Diagnosis not present

## 2023-10-29 DIAGNOSIS — Z0000 Encounter for general adult medical examination without abnormal findings: Secondary | ICD-10-CM | POA: Diagnosis not present

## 2023-10-29 DIAGNOSIS — I129 Hypertensive chronic kidney disease with stage 1 through stage 4 chronic kidney disease, or unspecified chronic kidney disease: Secondary | ICD-10-CM | POA: Diagnosis not present

## 2023-10-29 DIAGNOSIS — E063 Autoimmune thyroiditis: Secondary | ICD-10-CM | POA: Diagnosis not present

## 2023-10-29 DIAGNOSIS — E782 Mixed hyperlipidemia: Secondary | ICD-10-CM | POA: Diagnosis not present

## 2023-10-30 LAB — LAB REPORT - SCANNED: EGFR: 27

## 2023-11-02 ENCOUNTER — Ambulatory Visit: Payer: Medicare Other | Admitting: Cardiology

## 2023-11-06 ENCOUNTER — Ambulatory Visit: Payer: Medicare Other | Admitting: Cardiology

## 2023-11-06 DIAGNOSIS — N179 Acute kidney failure, unspecified: Secondary | ICD-10-CM | POA: Diagnosis not present

## 2023-11-09 ENCOUNTER — Ambulatory Visit: Payer: Medicare Other | Attending: Internal Medicine

## 2023-11-09 DIAGNOSIS — I5022 Chronic systolic (congestive) heart failure: Secondary | ICD-10-CM

## 2023-11-09 DIAGNOSIS — Z95810 Presence of automatic (implantable) cardiac defibrillator: Secondary | ICD-10-CM

## 2023-11-13 NOTE — Progress Notes (Signed)
 EPIC Encounter for ICM Monitoring  Patient Name: Nicole Clements is a 83 y.o. female Date: 11/13/2023 Primary Care Physican: Loreli Kins, MD Primary Cardiologist: Alvan Electrophysiologist: Waddell Pore Pacing: 96%  02/04/2023 Weight: 138 lbs 03/10/2023 Weight: 138 lbs 07/30/2023 Weight: 138 lbs    Clinical Status Since 04-Oct-2023 Time in AT/AF  <0.1 hours/day (<0.1 %)     Transmission reviewed.    Diet:  Typically Does not limit salt intake.    Optivol thoracic impedance suggesting normal fluid levels since 11/22.   Prescribed:   Furosemide  20 mg Take 20 mg daily. You may take an extra 20 mg daily as needed for swelling.   Potassium 20 mEq take 1 tablet daily Spironolactone  25 mg take 0.5 tablet (12.5 mg total) twice a day   Labs:  12/12/2022 Creatinine 1.47, BUN 16, Potassium 4.1, Sodium 133, GFR 36 A complete set of results can be found in Results Review.   Recommendations:   No changes.   Follow-up plan: ICM clinic phone appointment on 12/14/2023.  91 day device clinic remote scheduled 12/23/2023.   EP/Cardiology Office Visits:  02/09/2024 with Dr Alvan.   Recall 03/05/2024 with Dr Waddell.   Copy of ICM check sent to Dr. Waddell.    3 month ICM trend: 11/09/2023.    12-14 Month ICM trend:     Nicole GORMAN Garner, RN 11/13/2023 8:56 AM

## 2023-11-19 DIAGNOSIS — E875 Hyperkalemia: Secondary | ICD-10-CM | POA: Diagnosis not present

## 2023-11-26 ENCOUNTER — Encounter: Payer: Self-pay | Admitting: Family Medicine

## 2023-11-26 ENCOUNTER — Telehealth: Payer: Self-pay | Admitting: Cardiology

## 2023-11-26 LAB — LAB REPORT - SCANNED: EGFR: 32

## 2023-11-26 NOTE — Telephone Encounter (Signed)
Cat-referral coordinator at [facility] at [facility] Dr. Clelia Croft, faxed over labs and referral.  Dr. Clelia Croft would like for patient's labs to be reviewed.  He really would like for patient to be seen within the next month.  Patient is currently scheduled for April.  Cat would like a call back once they are been reviewed.

## 2023-11-27 NOTE — Telephone Encounter (Signed)
Please advise

## 2023-11-29 NOTE — Telephone Encounter (Signed)
Labs reviewed, agree with pcp lowering lasix as they had done due to low sodium. She needs to watch out for any increased swelling or weight gain on the lower lasix.  Appt Feb 18 220pm with me if nothing sooner opens up  Dominga Ferry MD

## 2023-11-30 NOTE — Telephone Encounter (Signed)
Per [place], okay to add to 11:20 am 12/22/2023 Cat at [facility] informed and will contact patient with appointment information.

## 2023-12-14 ENCOUNTER — Ambulatory Visit: Payer: Medicare Other | Attending: Internal Medicine

## 2023-12-14 DIAGNOSIS — I5022 Chronic systolic (congestive) heart failure: Secondary | ICD-10-CM | POA: Diagnosis not present

## 2023-12-14 DIAGNOSIS — Z95810 Presence of automatic (implantable) cardiac defibrillator: Secondary | ICD-10-CM

## 2023-12-15 NOTE — Progress Notes (Signed)
 EPIC Encounter for ICM Monitoring  Patient Name: Nicole Clements is a 83 y.o. female Date: 12/15/2023 Primary Care Physican: Glena Landau, MD Primary Cardiologist: Amanda Jungling Electrophysiologist: Arvid Latino Pacing: 89.6%  02/04/2023 Weight: 138 lbs 03/10/2023 Weight: 138 lbs 07/30/2023 Weight: 138 lbs  12/15/2023 Weight: 130 lbs   Clinical Status Since 03-Dec-2023 Time in AT/AF 0.0 hours/day (0.0 %) VT-NS (>4 beats, >200 bpm) 1     Spoke with patient and heart failure questions reviewed.  Transmission results reviewed.  Pt asymptomatic for fluid accumulation.  Reports feeling well at this time and voices no complaints.     Diet:  Typically Does not limit salt intake.    Optivol thoracic impedance suggesting possible fluid accumulation starting 1/18.  Fluid index greater than normal threshold starting 1/31.   Prescribed:   Furosemide  20 mg Take 20 mg daily.  You may take an extra 20 mg daily as needed for swelling.  2/12 Confirmed she is taking 1 a day as prescribed.  Potassium 20 mEq take 1 tablet daily Spironolactone  25 mg take 0.5 tablet (12.5 mg total) twice a day   Labs:  11/19/2023 Creatinine 1.62, BUN 18, Potassium 4.7, Sodium 128, GFR 32 11/06/2023 Creatinine 1.69, BUN 29, Potassium 5.9, Sodium 130, GFR 30 10/29/2023 Creatinine 1.84, BUN 37, Potassium 5.9, Sodium 129, GFR 27 12/12/2022 Creatinine 1.47, BUN 16, Potassium 4.1, Sodium 133, GFR 36 A complete set of results can be found in Results Review.   Recommendations:  Copy sent to Dr Amanda Jungling for review.  Will recheck fluid levels on 2/17.     Follow-up plan: ICM clinic phone appointment on 12/21/2023 to recheck fluid levels.  91 day device clinic remote scheduled 12/23/2023.   EP/Cardiology Office Visits:  12/22/2023 with Dr Amanda Jungling.   Recall 03/05/2024 with Dr Carolynne Citron.   Copy of ICM check sent to Dr. Carolynne Citron.    3 month ICM trend: 12/14/2023.    12-14 Month ICM trend:     Almyra Jain, RN 12/15/2023 10:14 AM

## 2023-12-21 ENCOUNTER — Ambulatory Visit: Payer: Medicare Other | Attending: Internal Medicine

## 2023-12-21 DIAGNOSIS — I5022 Chronic systolic (congestive) heart failure: Secondary | ICD-10-CM

## 2023-12-21 DIAGNOSIS — Z95810 Presence of automatic (implantable) cardiac defibrillator: Secondary | ICD-10-CM

## 2023-12-21 NOTE — Progress Notes (Signed)
EPIC Encounter for ICM Monitoring  Patient Name: Nicole Clements is a 83 y.o. female Date: 12/21/2023 Primary Care Physican: Lupita Raider, MD Primary Cardiologist: Wyline Mood Electrophysiologist: Rae Roam Pacing: 89.8%  02/04/2023 Weight: 138 lbs 03/10/2023 Weight: 138 lbs 07/30/2023 Weight: 138 lbs  12/15/2023 Weight: 130 lbs   Clinical Status Since 13-Dec-2023 Time in AT/AF 0.0 hours/day (0.0 %)     Transmission results reviewed.     Diet:  Typically Does not limit salt intake.    Optivol thoracic impedance suggesting ongoing possible fluid accumulation starting 1/18.  Fluid index greater than normal threshold starting 1/31.   Prescribed:   Furosemide 20 mg Take 20 mg daily.  You may take an extra 20 mg daily as needed for swelling.  2/12 Confirmed she is taking 1 a day as prescribed.  Potassium 20 mEq take 1 tablet daily Spironolactone 25 mg take 0.5 tablet (12.5 mg total) twice a day   Labs:  11/19/2023 Creatinine 1.62, BUN 18, Potassium 4.7, Sodium 128, GFR 32 11/06/2023 Creatinine 1.69, BUN 29, Potassium 5.9, Sodium 130, GFR 30 10/29/2023 Creatinine 1.84, BUN 37, Potassium 5.9, Sodium 129, GFR 27 12/12/2022 Creatinine 1.47, BUN 16, Potassium 4.1, Sodium 133, GFR 36 A complete set of results can be found in Results Review.   Recommendations:  Copy sent to Dr Wyline Mood for review at 2/18 OV appointment.   Follow-up plan: ICM clinic phone appointment on 12/28/2023 to recheck fluid levels.  91 day device clinic remote scheduled 12/23/2023.   EP/Cardiology Office Visits:  12/22/2023 with Dr Wyline Mood.   Recall 03/05/2024 with Dr Ladona Ridgel.   Copy of ICM check sent to Dr. Ladona Ridgel.    3 month ICM trend: 12/21/2023.    12-14 Month ICM trend:     Karie Soda, RN 12/21/2023 2:55 PM

## 2023-12-22 ENCOUNTER — Encounter: Payer: Self-pay | Admitting: Cardiology

## 2023-12-22 ENCOUNTER — Ambulatory Visit: Payer: Medicare Other | Attending: Cardiology | Admitting: Cardiology

## 2023-12-22 VITALS — BP 134/72 | HR 76 | Ht 62.5 in | Wt 123.6 lb

## 2023-12-22 DIAGNOSIS — I2510 Atherosclerotic heart disease of native coronary artery without angina pectoris: Secondary | ICD-10-CM | POA: Diagnosis not present

## 2023-12-22 DIAGNOSIS — I5022 Chronic systolic (congestive) heart failure: Secondary | ICD-10-CM | POA: Diagnosis not present

## 2023-12-22 DIAGNOSIS — R634 Abnormal weight loss: Secondary | ICD-10-CM | POA: Diagnosis not present

## 2023-12-22 DIAGNOSIS — R197 Diarrhea, unspecified: Secondary | ICD-10-CM | POA: Diagnosis not present

## 2023-12-22 DIAGNOSIS — E782 Mixed hyperlipidemia: Secondary | ICD-10-CM | POA: Diagnosis not present

## 2023-12-22 MED ORDER — SPIRONOLACTONE 25 MG PO TABS: 12.5000 mg | ORAL_TABLET | Freq: Every day | ORAL | 3 refills | Status: DC

## 2023-12-22 NOTE — Progress Notes (Signed)
Clinical Summary Ms. Harwood is a 83 y.o.female seen today for follow up of the following medical problems.     1. 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 Admit 07/2014 with CHF and troponin elevation - cath 07/2014, LM 30% distal, LAD mid 50%, LCX ostial 50% and mid 50%, small intermediate Shelton Soler 70%. RCA diffuse 50%, distal RCA with ulcerated 90% lesion, PDA 80-90% too small for PCI. LVEF 30%. Received DES to distal RCA, post procedure the PDA became occluded.   - echo 07/2014 LVEF 20%, inferior akinesis, grade II diastolic dysfunction. Repeat echo 10/2014 LVEF remains 15-20%.   - 04/2015 CRT-D device placed by Dr Graciela Husbands. Normal function Jan 2018.  10/2015 echo LVEF 25-30%  - could not afford entresto, back on losartan.     04/2021 echo LVEF 25-30% - no SOB/no DOE, no chest pains - compliant with meds - 03/2023 device check normal device function  - pcp lowered lasix due to low sodium. Na down to 128 - taking lasix 20mg  every other day, has noticed some welling.  - has had some LE edema but denies significant SOB/DOE - some recent diarrhea depending on her diet.     2. Hyperlipidemia  - atorva was too expensive, changed to pravastatin by pcp   10/2020 TC 104 TG 73 HDL 40 LDL 64   -due for repeat panel   3. CKD III - followed by pcp Past Medical History:  Diagnosis Date   AICD (automatic cardioverter/defibrillator) present    CAD (coronary artery disease) April 2014   DES to PLA, occluded PDA 07/2014   CHF (congestive heart failure) (HCC)    Hypercholesterolemia    Hypertension    Ischemic cardiomyopathy    LVEF 25%-45%   IVCD (intraventricular conduction defect)    PVC's (premature ventricular contractions)    S/P colonoscopy August 2007   Hyperplastic polyps, rare sigmoid and descending colon diverticulosis, small internal hemorrhoids   STEMI (ST elevation myocardial infarction) (HCC) 07/22/1996     Allergies  Allergen Reactions   Lactose  Intolerance (Gi) Other (See Comments)    GI Upset    Sulfa Antibiotics Rash     Current Outpatient Medications  Medication Sig Dispense Refill   acetaminophen (TYLENOL) 325 MG tablet Take by mouth.     aspirin EC 81 MG tablet Take 1 tablet (81 mg total) by mouth daily. 90 tablet 3   carvedilol (COREG) 25 MG tablet TAKE 1 TABLET BY MOUTH TWICE DAILY 60 tablet 6   Cholecalciferol (VITAMIN D3) 2000 UNITS capsule Take 2,000 Units by mouth daily.     furosemide (LASIX) 20 MG tablet TAKE 1 TABLET BY MOUTH EVERY DAY. MAY TAKE AN ADDITIONAL TABLET EVERY DAY AS NEEDED FOR SWELLING 100 tablet 1   losartan (COZAAR) 25 MG tablet TAKE 1 TABLET(25 MG) BY MOUTH DAILY 90 tablet 3   nitroGLYCERIN (NITROSTAT) 0.4 MG SL tablet Place 1 tablet (0.4 mg total) under the tongue every 5 (five) minutes x 3 doses as needed for chest pain. 25 tablet 2   Omega-3 Fatty Acids (FISH OIL) 1200 MG CAPS Take 1,200 mg by mouth daily.     potassium chloride SA (KLOR-CON M) 20 MEQ tablet TAKE 1 TABLET(20 MEQ) BY MOUTH DAILY 90 tablet 1   pravastatin (PRAVACHOL) 80 MG tablet Take 80 mg by mouth daily.     spironolactone (ALDACTONE) 25 MG tablet TAKE 1/2 TABLET(12.5 MG) BY MOUTH TWICE DAILY 90 tablet 3   No  current facility-administered medications for this visit.     Past Surgical History:  Procedure Laterality Date   ABDOMINAL HYSTERECTOMY     BIV ICD GENERATOR CHANGEOUT N/A 12/22/2022   Procedure: BIV ICD GENERATOR CHANGEOUT;  Surgeon: Marinus Maw, MD;  Location: [facility] INVASIVE CV LAB;  Service: Cardiovascular;  Laterality: N/A;   BREAST CYST EXCISION Left    unsure when   CARDIAC CATHETERIZATION  02/2013   Med Rx   CATARACT EXTRACTION W/PHACO Right 09/15/2017   Procedure: CATARACT EXTRACTION PHACO AND INTRAOCULAR LENS PLACEMENT (IOC);  Surgeon: Jethro Bolus, MD;  Location: [facility];  Service: Ophthalmology;  Laterality: Right;  CDE: 12.22   CATARACT EXTRACTION W/PHACO Left 09/29/2017   Procedure: CATARACT EXTRACTION  PHACO AND INTRAOCULAR LENS PLACEMENT (IOC);  Surgeon: Jethro Bolus, MD;  Location: [facility];  Service: Ophthalmology;  Laterality: Left;  CDE: 10.02   COLONOSCOPY N/A 12/24/2015   Dr. Fields:moderate sized internal hemorrhoids/moderate diverticulosis, negative microscopic colitis    EP IMPLANTABLE DEVICE  04/18/2015   BV ICD   EP IMPLANTABLE DEVICE N/A 04/18/2015   Procedure: BiV ICD Insertion CRT-D;  Surgeon: Duke Salvia, MD;  Location: [facility] INVASIVE CV LAB;  Service: Cardiovascular;  Laterality: N/A;   LEFT AND RIGHT HEART CATHETERIZATION WITH CORONARY ANGIOGRAM N/A 07/17/2014   Procedure: LEFT AND RIGHT HEART CATHETERIZATION WITH CORONARY ANGIOGRAM;  Surgeon: Micheline Chapman, MD;  Location: [facility] CATH LAB;  Service: Cardiovascular;  Laterality: N/A;   PERCUTANEOUS CORONARY STENT INTERVENTION (PCI-S)  07/17/2014   Procedure: PERCUTANEOUS CORONARY STENT INTERVENTION (PCI-S);  Surgeon: Micheline Chapman, MD;  Location: [facility] CATH LAB;  Service: Cardiovascular;;  Distal RCA   S/P Hysterectomy       Allergies  Allergen Reactions   Lactose Intolerance (Gi) Other (See Comments)    GI Upset    Sulfa Antibiotics Rash      Family History  Problem Relation Age of Onset   Hypertension Sister    Diabetes Mellitus II Sister    Hypertension Brother    Diabetes Mellitus II Brother    Hypertension Brother    Diabetes Mellitus II Brother    Hypertension Brother    Diabetes Mellitus II Brother    Colon cancer Neg Hx      Social History Ms. Whistler reports that she has been smoking cigarettes. She started smoking about 61 years ago. She has a 30.9 pack-year smoking history. She has never used smokeless tobacco. Ms. Manygoats reports no history of alcohol use.     Physical Examination Today's Vitals   12/22/23 1119  BP: 134/72  Pulse: 76  SpO2: 98%  Weight: 123 lb 9.6 oz (56.1 kg)  Height: 5' 2.5" (1.588 m)   Body mass index is 22.25 kg/m.  Gen: resting comfortably, no acute  distress HEENT: no scleral icterus, pupils equal round and reactive, no palptable cervical adenopathy,  CV: RRR, no m/rg no jvd Resp: Clear to auscultation bilaterally GI: abdomen is soft, non-tender, non-distended, normal bowel sounds, no hepatosplenomegaly MSK: extremities are warm,trace bilateral edema Skin: warm, no rash Neuro:  no focal deficits Psych: appropriate affect   Diagnostic Studies  02/2013 Cath   Procedural Findings:   Hemodynamics:   AO 116/45 with a mean of 70   LV 120/18   Coronary angiography:   Coronary dominance: right   Left mainstem: The left main is patent with 30% distal left main stenosis as the vessel trifurcates into the LAD, intermediate Authur Cubit, and left circumflex.   Left anterior  descending (LAD): The LAD is patent with diffuse disease noted. There is minimal calcification present. The first diagonal is patent with 30-50% diffuse disease. The LAD after the first septal perforator has 40-50% stenosis. The vessel reaches the apex and wraps around the left ventricular apex without significant stenosis.   Left circumflex (LCx): The intermediate Johaan Ryser is small in caliber and diffusely diseased with 50-60% proximal vessel stenosis the AV groove circumflex is diffusely diseased with 50% ostial stenosis and 30% mid stenosis it supplies 2 small posterolateral Jordell Outten   Right coronary artery (RCA): The RCA is diffusely diseased. There is significant tortuosity, especially around the junction of the mid and distal vessel. The vessel is diffusely calcified. The mid vessel has tandem 50% stenoses. The distal vessel has 80-90 % stenosis just before the bifurcation of the PDA and posterior AV segment. The PDA and posterior AV segment with 3 posterolateral branches are patent.   Left ventriculography: Left ventricular systolic function is moderately depressed. There is severe hypokinesis of the basal and midinferior walls. The anterolateral and apical walls contract normally.  The left ventricular ejection fraction is estimated at 45%.   Abdominal aortogram: The abdominal aorta is patent. The renal arteries are single bilaterally and they are widely patent. There appears to be nonobstructive stenosis of the right common iliac artery at the aortoiliac junction. The left common iliac artery appears to have significant ostial stenosis estimated at at least 75% angiographically. The visualized portions of the external iliac and common femoral arteries are patent   Final Conclusions:  1. Severe single-vessel coronary artery disease involving the distal right coronary artery   2. Moderate LV dysfunction with segmental severe hypokinesis of the inferior wall and normal contraction of the antero-apex with estimated left ventricular ejection fraction of 45%.   3. Severe left common iliac artery stenosis   Recommendations: The patient has severe distal RCA disease. She has a known history of inferior wall infarction and remote PCA. This fits with that clinical history. She has nonobstructive disease of the left coronary artery. Her left ventricular ejection fraction by ventriculography is clearly greater than 35%. With her history of previous MI, I do not think PCI is indicated with an absence of angina. Will carefully discussed symptoms with the patient, but I am inclined to treat her medically. Will also review indication for consideration of peripheral intervention for her iliac disease.       01/2013 Echo   Study Conclusions  - Left ventricle: The cavity size was mildly to moderately dilated. Wall thickness was normal. Systolic function was severely reduced. The estimated ejection fraction was 25%. Severe diffuse hypokinesis. - Aortic valve: Mildly calcified annulus. Trileaflet. - Mitral valve: Calcified annulus. - Left atrium: The atrium was moderately dilated. - Right ventricle: The cavity size was normal. Wall thickness was mildly to moderately increased. - Right atrium:  The atrium was mildly dilated. - Atrial septum: No defect or patent foramen ovale was identified. Impressions:  - Compared to the prior study of 03/13/11, there has been substantial deterioration in LV systolic function.   07/2014 Echo Study Conclusions  - Left ventricle: The cavity size was severely dilated. Wall thickness was normal. The estimated ejection fraction was 20%. Diffuse hypokinesis. There is akinesis of the inferolateral and inferior myocardium. Features are consistent with a pseudonormal left ventricular filling pattern, with concomitant abnormal relaxation and increased filling pressure (grade 2 diastolic dysfunction). Doppler parameters are consistent with high ventricular filling pressure. - Mitral valve: There was mild  to moderate regurgitation. - Left atrium: The atrium was moderately dilated.  Impressions:  - Global hypokinesis; inferior and inferolateral akinesis; overall severely reduced LV function; grade 2 diastolic dysfunction with elevated left ventricular filling pressures, moderate LAE; mild to moderate MR.  07/2014 Cath PROCEDURAL FINDINGS   Hemodynamics:   AO 122/48   LV 118/14   RA 9   RV 41/11   PA 37/5 with a mean of 21   PCWP 27   Oxygen saturations   AO 90   PA 56   Cardiac output 3.5   Cardiac index 2.0   Coronary angiography:   Coronary dominance: right   Left mainstem: The left mainstem is patent with 30% distal left main stenosis.   Left anterior descending (LAD): The LAD is diffusely diseased in the midportion. There are sequential 50% stenoses, unchanged from previous study. The first diagonal Jeramine Delis is large in distribution with diffuse nonobstructive disease noted. The mid and distal LAD are patent without significant disease.   Left circumflex (LCx): The left circumflex has 50% ostial stenosis, 50% mid vessel stenosis, and to OM branches with no significant disease. There is an intermediate Carel Carrier is diffusely diseased with up to  70% stenosis. This vessel is small in caliber.   Right coronary artery (RCA): Dominant vessel. Tortuous with diffuse nonobstructive stenosis throughout the proximal and mid portions. There are diffuse 50% stenosis present. The distal vessel has an ulcerated-appearing irregular 90% stenosis involving the bifurcation of the PDA and posterolateral branches. The posterolateral branches are patent with mild diffuse disease. The PDA arises an acute angulation with 30-40% stenosis. The mid body of the PDA has an 80-90% stenosis in an area where the vessel is too small for PCI.   Left ventriculography: The ventricle appears the synchronous. The basal and midinferior wall are akinetic. The basal inferolateral wall is severely hypokinetic. The anterolateral wall and apex contract normally. The estimated LVEF is 30%.   PCI Procedure Note: Following the diagnostic procedure, the decision was made to proceed with PCI of the distal RCA. The lesion is complex, there is a large amount of myocardium involved. I felt there was a risk of potentially compromising the PDA Bessie Livingood, but overall in my opinion the risk/benefit was favorable for PCI considering the severe stenosis involving the entire RCA distribution. The sheath was upsized to a 6 [place]. Weight-based bivalirudin was given for anticoagulation. The patient was loaded with Plavix 600 mg on the table. Once a therapeutic ACT was achieved, a 6 [place] JR 4 guide catheter was inserted. A cougar coronary guidewire was used to cross the lesion into the PLA Kainalu Heggs. The lesion was predilated with a 2.5 x 15 mm balloon. The lesion was then stented with a 3.0 by 16mm Promus DES stent. The stent was postdilated with a 3.25 mm noncompliant balloon to 18 atmospheres. The PDA was stented across as the stent extended from the distal RCA proximal PLA Ikeisha Blumberg. The PDA was totally occluded. I made a long attempt with a whisper wire using multiple bands on the tip of the wire, but all of these  attempts were unsuccessful at rewiring the PDA. The patient was completely asymptomatic and specifically denied any chest pain. Her hemodynamics remained stable and there were no changes in her rhythm or blood pressure. A final image of the left coronary artery was obtained and this demonstrated a collateral from the LAD to the PDA Piper Albro. Following PCI, there was 0% residual stenosis and TIMI-3 flow. Final angiography confirmed  an excellent result. Femoral hemostasis was achieved will be achieved with manual hemostasis. The patient tolerated the PCI procedure well. There were no immediate procedural complications. The patient was transferred to the post catheterization recovery area for further monitoring.   PCI Data:   Vessel - RCA/Segment - distal   Percent Stenosis (pre) 90   TIMI-flow 3   Stent 3.0 by 16mm Promus DES   Percent Stenosis (post) 0   TIMI-flow (post) 3   Contrast: 175 cc Omnipaque   Radiation dose/Fluoro time: 15.5 minutes   Estimated Blood Loss: Minimal   Final Conclusions:  1. Nonobstructive disease involving the LAD, left circumflex, and OM/diagonal side branches   2. Severe stenosis of the distal RCA bifurcation, treated successfully with a drug-eluting stent into the PLA Nayelis Bonito with residual occlusion of the PDA Levaeh Vice   3. Severe segmental LV systolic dysfunction   Recommendations: Dual antiplatelet therapy with aspirin and Plavix for at least 12 months. Will cycle cardiac enzymes since the PDA Jaymason Ledesma is occluded after stenting.   10/2014 Echo Study Conclusions  - Procedure narrative: Transthoracic echocardiography. Image   quality was suboptimal. The study was technically difficult, as a   result of poor sound wave transmission and body habitus. - Left ventricle: Systolic function is severely reduced, estimated   EF 15-20%. The cavity size was severely dilated. Doppler   parameters are consistent with abnormal left ventricular   relaxation (grade 1 diastolic  dysfunction). Doppler parameters   are consistent with high ventricular filling pressure. Medial   E/e&' 24. - Regional wall motion abnormality: Akinesis of the basal-mid   inferior and basal inferolateral myocardium; severe hypokinesis   of the apical inferior and mid inferolateral myocardium. Severe,   diffuse hypokinesis. - Ventricular septum: Septal motion showed abnormal function and   dyssynergy. These changes are consistent with intraventricular   conduction delay. - Aortic valve: Mildly calcified annulus. Trileaflet; mildly   thickened leaflets. - Mitral valve: Mildly dilated annulus. Mildly thickened leaflets .   There was moderate eccentric regurgitation. Likely functional due   to mitral annular dilatation. - Left atrium: The atrium was severely dilated. Volume/bsa, S: 47.1   ml/m^2. - Right ventricle: Systolic function was mildly reduced. - Right atrium: The atrium was mildly dilated. - Atrial septum: There was increased thickness of the septum,   consistent with lipomatous hypertrophy. - Tricuspid valve: There was mild regurgitation. - Pulmonary arteries: PA peak pressure: 42 mm Hg (S). Mildly   elevated pulmonary pressures. - Pericardium, extracardiac: There was a small pericardial   effusion, with no evidence of tamponade physiology.   04/2015 Carotid US IMPRESSION: 1. Mild (1-49%) stenosis of the proximal right internal carotid artery secondary to smooth heterogeneous atherosclerotic plaque. No interval change compared to recent prior imaging from earlier this month. 2. Mild (1-49%) stenosis of the proximal left internal carotid artery secondary to heterogeneous and irregular atherosclerotic plaque. No interval change compared to recent prior imaging from earlier this month. 3. Vertebral arteries remain patent with normal antegrade flow. Signed,       10/2015 echo Study Conclusions  - Left ventricle: The cavity size was moderately to severely   dilated.  Wall thickness was normal. Systolic function was   severely reduced. The estimated ejection fraction was in the   range of 25% to 30%. Diffuse hypokinesis. There is akinesis of   the basalanteroseptal and anterior myocardium. Doppler parameters   are consistent with abnormal left ventricular relaxation (grade 1   diastolic dysfunction). -  Mitral valve: Calcified annulus. Mildly calcified leaflets .   There was mild regurgitation. - Right atrium: Central venous pressure (est): 3 mm Hg. - Atrial septum: No defect or patent foramen ovale was identified. - Tricuspid valve: There was trivial regurgitation. - Pulmonary arteries: PA peak pressure: 27 mm Hg (S). - Pericardium, extracardiac: A prominent pericardial fat pad was   present.  Impressions:  - Moderate to severe LV chamber dilatation with LVEF approximately   25-30%. There is diffuse hypokinesis with near akinesis of the   basal anteroseptal and anterior myocardium. Compared to the   previous study from June 2016 there has been some improvement in   LVEF. Grade 1 diastolic dysfunction. MAC with mild mitral   regurgitation. Trivial tricuspid regurgitation with PASP 27 mmHg.  Transthoracic echocardiography.  M-mode, complete 2D, spectral Doppler, and color Doppler.  Birthdate:  Patient birthdate: 1941-02-11.  Age:  Patient is 83 yr old.  Sex:  Gender: female. BMI: 27.5 kg/m^2.  Blood pressure:     98/47  Patient status: Inpatient.  Study date:  Study date: 10/04/2015. Study time: 11:27 AM.  Location:  Echo laboratory     04/2021 echo IMPRESSIONS     1. Left ventricular ejection fraction, by estimation, is 25 to 30%. The  left ventricle has severely decreased function. The left ventricle  demonstrates global hypokinesis. The left ventricular internal cavity size  was moderately dilated. Left  ventricular diastolic parameters are consistent with Grade I diastolic  dysfunction (impaired relaxation). The average left ventricular  global  longitudinal strain is -10.3 %. The global longitudinal strain is  abnormal.   2. Right ventricular systolic function is normal. The right ventricular  size is normal.   3. A small pericardial effusion is present. The pericardial effusion is  localized near the right atrium.   4. The mitral valve is normal in structure. Mild to moderate mitral valve  regurgitation. No evidence of mitral stenosis.   5. The aortic valve has an indeterminant number of cusps. Aortic valve  regurgitation is not visualized. No aortic stenosis is present.            Assessment and Plan   1. CAD - no symptoms, continue current meds   2. Chronic systolic HF - Medical therpay limited by soft bp's and renal insufficiency.Sherryll Burger and farxiga were too expensive - some recent issues with low Na, lasix had been lowered by pcp to just 20mg  every other day. Recheck labs, can continue lasix as dosed but can take additional as need for severe swelling. Lower aldactone to 12.5mg  daily which can also contribute to low sodium.  - recheck bmet/mg   3. Hyperlipidemia - statin changed previously due to cost, -repeat lipid panel     4. Weight loss/diarrhea - refer to GI  EKG today shows AV paced rhythm     Antoine Poche, M.D.

## 2023-12-22 NOTE — Patient Instructions (Signed)
Medication Instructions:  Your physician has recommended you make the following change in your medication:   -Decrease Aldactone (Spironolactone) to 12.5 mg once daily ( half tablet)  *If you need a refill on your cardiac medications before your next appointment, please call your pharmacy*   Lab Work: BMET MAG Fasting Lipid Panel   If you have labs (blood work) drawn today and your tests are completely normal, you will receive your results only by: MyChart Message (if you have MyChart) OR A paper copy in the mail If you have any lab test that is abnormal or we need to change your treatment, we will call you to review the results.   Testing/Procedures: None   Follow-Up: At [facility], you and your health needs are our priority.  As part of our continuing mission to provide you with exceptional heart care, we have created designated Provider Care Teams.  These Care Teams include your primary Cardiologist (physician) and Advanced Practice Providers (APPs -  Physician Assistants and Nurse Practitioners) who all work together to provide you with the care you need, when you need it.  We recommend signing up for the patient portal called "MyChart".  Sign up information is provided on this After Visit Summary.  MyChart is used to connect with patients for Virtual Visits (Telemedicine).  Patients are able to view lab/test results, encounter notes, upcoming appointments, etc.  Non-urgent messages can be sent to your provider as well.   To learn more about what you can do with MyChart, go to [email or web address].    Your next appointment:   3 month(s)  Provider:   You may see Dina Rich, MD or one of the following Advanced Practice Providers on your designated Care Team:   Randall An, PA-C  Jacolyn Reedy, PA-C    You have been referred to GI. They will call you to set up your first appointment.

## 2023-12-23 ENCOUNTER — Ambulatory Visit (INDEPENDENT_AMBULATORY_CARE_PROVIDER_SITE_OTHER): Payer: Medicare Other

## 2023-12-23 DIAGNOSIS — I255 Ischemic cardiomyopathy: Secondary | ICD-10-CM

## 2023-12-24 LAB — CUP PACEART REMOTE DEVICE CHECK
Battery Remaining Longevity: 80 mo
Battery Voltage: 2.99 V
Brady Statistic RV Percent Paced: 1.35 %
Date Time Interrogation Session: 20250218225005
HighPow Impedance: 53 Ohm
Implantable Lead Connection Status: 753985
Implantable Lead Connection Status: 753985
Implantable Lead Connection Status: 753985
Implantable Lead Implant Date: 20160615
Implantable Lead Implant Date: 20160615
Implantable Lead Implant Date: 20160615
Implantable Lead Location: 753858
Implantable Lead Location: 753859
Implantable Lead Location: 753860
Implantable Lead Model: 4398
Implantable Lead Model: 5076
Implantable Pulse Generator Implant Date: 20240219
Lead Channel Impedance Value: 304 Ohm
Lead Channel Impedance Value: 323 Ohm
Lead Channel Impedance Value: 323 Ohm
Lead Channel Impedance Value: 323 Ohm
Lead Channel Impedance Value: 342 Ohm
Lead Channel Impedance Value: 418 Ohm
Lead Channel Impedance Value: 437 Ohm
Lead Channel Impedance Value: 513 Ohm
Lead Channel Impedance Value: 589 Ohm
Lead Channel Impedance Value: 722 Ohm
Lead Channel Impedance Value: 798 Ohm
Lead Channel Impedance Value: 855 Ohm
Lead Channel Impedance Value: 855 Ohm
Lead Channel Pacing Threshold Amplitude: 0.5 V
Lead Channel Pacing Threshold Amplitude: 0.625 V
Lead Channel Pacing Threshold Amplitude: 0.75 V
Lead Channel Pacing Threshold Pulse Width: 0.4 ms
Lead Channel Pacing Threshold Pulse Width: 0.4 ms
Lead Channel Pacing Threshold Pulse Width: 0.6 ms
Lead Channel Sensing Intrinsic Amplitude: 10.3 mV
Lead Channel Sensing Intrinsic Amplitude: 2.4 mV
Lead Channel Setting Pacing Amplitude: 1.5 V
Lead Channel Setting Pacing Amplitude: 2 V
Lead Channel Setting Pacing Amplitude: 2.5 V
Lead Channel Setting Pacing Pulse Width: 0.4 ms
Lead Channel Setting Pacing Pulse Width: 0.6 ms
Lead Channel Setting Sensing Sensitivity: 0.45 mV
Zone Setting Status: 755011
Zone Setting Status: 755011

## 2023-12-28 ENCOUNTER — Ambulatory Visit (INDEPENDENT_AMBULATORY_CARE_PROVIDER_SITE_OTHER): Payer: Medicare Other

## 2023-12-28 DIAGNOSIS — Z95810 Presence of automatic (implantable) cardiac defibrillator: Secondary | ICD-10-CM

## 2023-12-28 DIAGNOSIS — I5022 Chronic systolic (congestive) heart failure: Secondary | ICD-10-CM

## 2023-12-30 ENCOUNTER — Other Ambulatory Visit: Payer: Self-pay | Admitting: Cardiology

## 2023-12-30 NOTE — Progress Notes (Signed)
 EPIC Encounter for ICM Monitoring  Patient Name: Nicole Clements is a 83 y.o. female Date: 12/30/2023 Primary Care Physican: Lupita Raider, MD Primary Cardiologist: Wyline Mood Electrophysiologist: Rae Roam Pacing: 90.8%  02/04/2023 Weight: 138 lbs 03/10/2023 Weight: 138 lbs 07/30/2023 Weight: 138 lbs  12/15/2023 Weight: 130 lbs 12/30/2023 Weight: 123 lbs   Clinical Status Since 22-Dec-2023 Time in AT/AF 0.0 hours/day (0.0 %)     Spoke with patient and heart failure questions reviewed.  Transmission results reviewed.  Pt reports swelling in feet/legs but feels well.   She reports having occasional diarrhea and losing weight.  Dr Wyline Mood has referred her to GI physician.    She does not think the Lasix every other day that Dr Clelia Croft prescribed works to prevent or resolve swelling.  She would like to go back to taking lasix daily.   Advised to discuss with Dr Wyline Mood after 2/27 lab results come back and she agreed.     Diet:  Typically does not limit salt intake.    Optivol thoracic impedance suggesting ongoing possible fluid accumulation starting 1/18.  Fluid index greater than normal threshold starting 1/31.   Prescribed:   Furosemide 20 mg Take 1 tablet (20 mg total) by mouth every other day (Per PCP prescription).  You may take an extra 20 mg daily as needed for swelling.  Potassium 20 mEq take 1 tablet by mouth daily Spironolactone 25 mg take 0.5 tablet (12.5 mg total) by mouth daily   Labs:  12/31/2023 BMET Scheduled 11/19/2023 Creatinine 1.62, BUN 18, Potassium 4.7, Sodium 128, GFR 32 11/06/2023 Creatinine 1.69, BUN 29, Potassium 5.9, Sodium 130, GFR 30 10/29/2023 Creatinine 1.84, BUN 37, Potassium 5.9, Sodium 129, GFR 27 12/12/2022 Creatinine 1.47, BUN 16, Potassium 4.1, Sodium 133, GFR 36 A complete set of results can be found in Results Review.   Recommendations:  Pt intends to ask Dr Wyline Mood (after 2/27 labs results) if she can return to taking Lasix every day instead of every other  day as prescribed by Dr Clelia Croft.     Follow-up plan: ICM clinic phone appointment on 01/11/2024 to recheck fluid levels.  91 day device clinic remote scheduled 03/23/2024.   EP/Cardiology Office Visits:  Next 3 month appt with Dr Wyline Mood due 03/20/2024 but no recall.   Recall 03/05/2024 with Dr Ladona Ridgel.   Copy of ICM check sent to Dr. Ladona Ridgel.    3 month ICM trend: 12/27/2023.    12-14 Month ICM trend:     Karie Soda, RN 12/30/2023 10:01 AM

## 2023-12-31 DIAGNOSIS — I5022 Chronic systolic (congestive) heart failure: Secondary | ICD-10-CM | POA: Diagnosis not present

## 2023-12-31 DIAGNOSIS — E782 Mixed hyperlipidemia: Secondary | ICD-10-CM | POA: Diagnosis not present

## 2024-01-01 LAB — BASIC METABOLIC PANEL
BUN/Creatinine Ratio: 10 — ABNORMAL LOW (ref 12–28)
BUN: 15 mg/dL (ref 8–27)
CO2: 18 mmol/L — ABNORMAL LOW (ref 20–29)
Calcium: 8.5 mg/dL — ABNORMAL LOW (ref 8.7–10.3)
Chloride: 105 mmol/L (ref 96–106)
Creatinine, Ser: 1.46 mg/dL — ABNORMAL HIGH (ref 0.57–1.00)
Glucose: 97 mg/dL (ref 70–99)
Potassium: 4.7 mmol/L (ref 3.5–5.2)
Sodium: 136 mmol/L (ref 134–144)
eGFR: 36 mL/min/{1.73_m2} — ABNORMAL LOW (ref >59)

## 2024-01-01 LAB — LIPID PANEL
Chol/HDL Ratio: 3.2 {ratio} (ref 0.0–4.4)
Cholesterol, Total: 112 mg/dL (ref 100–199)
HDL: 35 mg/dL — ABNORMAL LOW (ref >39)
LDL Chol Calc (NIH): 63 mg/dL (ref 0–99)
Triglycerides: 68 mg/dL (ref 0–149)
VLDL Cholesterol Cal: 14 mg/dL (ref 5–40)

## 2024-01-01 LAB — MAGNESIUM: Magnesium: 1.3 mg/dL — ABNORMAL LOW (ref 1.6–2.3)

## 2024-01-06 ENCOUNTER — Telehealth: Payer: Self-pay

## 2024-01-06 MED ORDER — ATORVASTATIN CALCIUM 80 MG PO TABS: 80.0000 mg | ORAL_TABLET | Freq: Every day | ORAL | 3 refills | Status: DC

## 2024-01-06 MED ORDER — MAGNESIUM OXIDE 400 MG PO TABS: ORAL_TABLET | ORAL | 1 refills | Status: DC

## 2024-01-06 NOTE — Telephone Encounter (Signed)
 The patient has been notified of the result and verbalized understanding.  All questions (if any) were answered. Roseanne Reno, CMA 01/06/2024 10:56 AM

## 2024-01-06 NOTE — Telephone Encounter (Signed)
-----   Message from Dina Rich sent at 01/06/2024 10:48 AM EST ----- Labs show low magnesium, can she take magnesium sulfide 400mg  bid x 5 days, then 400mg  daily. Some mild kidney dysfunction that is overall stable. Cholesterol little higher than goal, id like to try changing her back to atorvastatin 80mg  daily, I think cost had been a prior issue a few years ago but I think likely this will have come down  Dominga Ferry MD

## 2024-01-07 ENCOUNTER — Encounter: Payer: Self-pay | Admitting: *Deleted

## 2024-01-11 ENCOUNTER — Ambulatory Visit: Payer: Medicare Other | Attending: Internal Medicine

## 2024-01-11 DIAGNOSIS — Z95810 Presence of automatic (implantable) cardiac defibrillator: Secondary | ICD-10-CM

## 2024-01-11 DIAGNOSIS — I5022 Chronic systolic (congestive) heart failure: Secondary | ICD-10-CM

## 2024-01-12 NOTE — Progress Notes (Signed)
 EPIC Encounter for ICM Monitoring  Patient Name: Nicole Clements is a 83 y.o. female Date: 01/12/2024 Primary Care Physican: Lupita Raider, MD Primary Cardiologist: Wyline Mood Electrophysiologist: Rae Roam Pacing: 90.4%  02/04/2023 Weight: 138 lbs 03/10/2023 Weight: 138 lbs 07/30/2023 Weight: 138 lbs  12/15/2023 Weight: 130 lbs 12/30/2023 Weight: 123 lbs   Clinical Status Since 27-Dec-2023 Time in AT/AF 0.0 hours/day (0.0 %)    Spoke with patient and heart failure questions reviewed.  Transmission results reviewed.  Pt continues to have swelling in feet and legs and taking Lasix every other day as prescribed by PCP.   She has not discussed with Dr Wyline Mood about taking Lasix and will call his office to confirm she can take daily.      Diet:  Typically does not limit salt intake.    Optivol thoracic impedance suggesting ongoing possible fluid accumulation starting 1/18.  Fluid index greater than normal threshold starting 1/31.   Prescribed:   Furosemide 20 mg Take 1 tablet (20 mg total) by mouth every day. You may take an extra 20 mg daily as needed for swelling (EPIC Prescription) but Per Pt, PCP prescribed 20 mg every other day. Potassium 20 mEq take 1 tablet by mouth daily Spironolactone 25 mg take 0.5 tablet (12.5 mg total) by mouth daily   Labs:  12/31/2023 Creatinine 1.46, BUN 15, Potassium 4.7, Sodium 136, GFR 36 11/19/2023 Creatinine 1.62, BUN 18, Potassium 4.7, Sodium 128, GFR 32 11/06/2023 Creatinine 1.69, BUN 29, Potassium 5.9, Sodium 130, GFR 30 10/29/2023 Creatinine 1.84, BUN 37, Potassium 5.9, Sodium 129, GFR 27 12/12/2022 Creatinine 1.47, BUN 16, Potassium 4.1, Sodium 133, GFR 36 A complete set of results can be found in Results Review.   Recommendations: Encouraged to call Dr Verna Czech office to discuss Lasix dosage.      Follow-up plan: ICM clinic phone appointment on 01/25/2024.  91 day device clinic remote scheduled 03/23/2024.   EP/Cardiology Office Visits:  03/21/2024  with Frutoso Schatz, NP.    Recall 03/05/2024 with Dr Ladona Ridgel.   Copy of ICM check sent to Dr. Ladona Ridgel.    3 month ICM trend: 01/10/2024.    12-14 Month ICM trend:     Karie Soda, RN 01/12/2024 7:54 AM

## 2024-01-18 ENCOUNTER — Ambulatory Visit: Payer: Medicare Other | Admitting: Physician Assistant

## 2024-01-25 ENCOUNTER — Ambulatory Visit: Attending: Internal Medicine

## 2024-01-25 DIAGNOSIS — I5022 Chronic systolic (congestive) heart failure: Secondary | ICD-10-CM

## 2024-01-25 DIAGNOSIS — Z95810 Presence of automatic (implantable) cardiac defibrillator: Secondary | ICD-10-CM

## 2024-01-26 NOTE — Progress Notes (Unsigned)
 Referring Provider: Antoine Poche, MD  Primary Care Physician:  Lupita Raider, MD Primary Gastroenterologist:  Dr. Marletta Lor  No chief complaint on file.   HPI:   Nicole Clements is a 83 y.o. female with history of CAD, CHF with severely reduced EF of 25 to 30%*** in 2022, ICD in place, HLD, HTN, presenting today at the request of Antoine Poche, MD for diarrhea and weight loss.  Per chart review, she does have history of diarrhea in the past.  Previously underwent colonoscopy in 2017 with evaluation for microscopic colitis that was negative.  She had done well with Viberzi in the past, but ultimately discontinued as she was doing well with Lactaid tablets only at her last visit in our office in 2017.  Colonoscopy 12/24/2015: Redundant left colon, moderate diverticulosis throughout the entire examined colon, moderate internal hemorrhoids.  Biopsies taken from the right colon.  Pathology was benign.  Today:     Past Medical History:  Diagnosis Date   AICD (automatic cardioverter/defibrillator) present    CAD (coronary artery disease) April 2014   DES to PLA, occluded PDA 07/2014   CHF (congestive heart failure) (HCC)    Hypercholesterolemia    Hypertension    Ischemic cardiomyopathy    LVEF 25%-45%   IVCD (intraventricular conduction defect)    PVC's (premature ventricular contractions)    S/P colonoscopy August 2007   Hyperplastic polyps, rare sigmoid and descending colon diverticulosis, small internal hemorrhoids   STEMI (ST elevation myocardial infarction) (HCC) 07/22/1996    Past Surgical History:  Procedure Laterality Date   ABDOMINAL HYSTERECTOMY     BIV ICD GENERATOR CHANGEOUT N/A 12/22/2022   Procedure: BIV ICD GENERATOR CHANGEOUT;  Surgeon: Marinus Maw, MD;  Location: [facility] INVASIVE CV LAB;  Service: Cardiovascular;  Laterality: N/A;   BREAST CYST EXCISION Left    unsure when   CARDIAC CATHETERIZATION  02/2013   Med Rx   CATARACT EXTRACTION W/PHACO Right  09/15/2017   Procedure: CATARACT EXTRACTION PHACO AND INTRAOCULAR LENS PLACEMENT (IOC);  Surgeon: Jethro Bolus, MD;  Location: [facility];  Service: Ophthalmology;  Laterality: Right;  CDE: 12.22   CATARACT EXTRACTION W/PHACO Left 09/29/2017   Procedure: CATARACT EXTRACTION PHACO AND INTRAOCULAR LENS PLACEMENT (IOC);  Surgeon: Jethro Bolus, MD;  Location: [facility];  Service: Ophthalmology;  Laterality: Left;  CDE: 10.02   COLONOSCOPY N/A 12/24/2015   Dr. Fields:moderate sized internal hemorrhoids/moderate diverticulosis, negative microscopic colitis    EP IMPLANTABLE DEVICE  04/18/2015   BV ICD   EP IMPLANTABLE DEVICE N/A 04/18/2015   Procedure: BiV ICD Insertion CRT-D;  Surgeon: Duke Salvia, MD;  Location: [facility] INVASIVE CV LAB;  Service: Cardiovascular;  Laterality: N/A;   LEFT AND RIGHT HEART CATHETERIZATION WITH CORONARY ANGIOGRAM N/A 07/17/2014   Procedure: LEFT AND RIGHT HEART CATHETERIZATION WITH CORONARY ANGIOGRAM;  Surgeon: Micheline Chapman, MD;  Location: [facility] CATH LAB;  Service: Cardiovascular;  Laterality: N/A;   PERCUTANEOUS CORONARY STENT INTERVENTION (PCI-S)  07/17/2014   Procedure: PERCUTANEOUS CORONARY STENT INTERVENTION (PCI-S);  Surgeon: Micheline Chapman, MD;  Location: [facility] CATH LAB;  Service: Cardiovascular;;  Distal RCA   S/P Hysterectomy      Current Outpatient Medications  Medication Sig Dispense Refill   acetaminophen (TYLENOL) 325 MG tablet Take by mouth.     aspirin EC 81 MG tablet Take 1 tablet (81 mg total) by mouth daily. 90 tablet 3   atorvastatin (LIPITOR) 80 MG tablet Take 1 tablet (80 mg total) by  mouth daily. 90 tablet 3   carvedilol (COREG) 25 MG tablet TAKE 1 TABLET BY MOUTH TWICE DAILY 60 tablet 6   Cholecalciferol (VITAMIN D3) 2000 UNITS capsule Take 2,000 Units by mouth daily.     furosemide (LASIX) 20 MG tablet TAKE 1 TABLET BY MOUTH EVERY DAY. MAY TAKE AN ADDITIONAL TABLET EVERY DAY AS NEEDED FOR SWELLING (Patient taking differently: Take 20 mg by mouth every  other day. TAKE 1 TABLET BY MOUTH EVERY DAY. MAY TAKE AN ADDITIONAL TABLET EVERY DAY AS NEEDED FOR SWELLING.) 100 tablet 1   losartan (COZAAR) 25 MG tablet TAKE 1 TABLET(25 MG) BY MOUTH DAILY 90 tablet 3   magnesium oxide (MAG-OX) 400 MG tablet Take 1 tablet by mouth twice daily for 5 days, THEN decrease to 1 tablet by mouth once daily 100 tablet 1   nitroGLYCERIN (NITROSTAT) 0.4 MG SL tablet Place 1 tablet (0.4 mg total) under the tongue every 5 (five) minutes x 3 doses as needed for chest pain. 25 tablet 2   Omega-3 Fatty Acids (FISH OIL) 1200 MG CAPS Take 1,200 mg by mouth daily.     potassium chloride SA (KLOR-CON M) 20 MEQ tablet TAKE 1 TABLET(20 MEQ) BY MOUTH DAILY 90 tablet 1   spironolactone (ALDACTONE) 25 MG tablet Take 0.5 tablets (12.5 mg total) by mouth daily. 45 tablet 3   No current facility-administered medications for this visit.    Allergies as of 01/27/2024 - Review Complete 12/22/2023  Allergen Reaction Noted   Lactose intolerance (gi) Other (See Comments) 04/29/2015   Sulfa antibiotics Rash 07/16/2011    Family History  Problem Relation Age of Onset   Hypertension Sister    Diabetes Mellitus II Sister    Hypertension Brother    Diabetes Mellitus II Brother    Hypertension Brother    Diabetes Mellitus II Brother    Hypertension Brother    Diabetes Mellitus II Brother    Colon cancer Neg Hx     Social History   Socioeconomic History   Marital status: Single    Spouse name: Not on file   Number of children: 1   Years of education: Not on file   Highest education level: Not on file  Occupational History   Occupation: Retired    Comment: Clinical biochemist rep  Tobacco Use   Smoking status: Every Day    Current packs/day: 0.50    Average packs/day: 0.5 packs/day for 61.9 years (31.0 ttl pk-yrs)    Types: Cigarettes    Start date: 02/26/1962   Smokeless tobacco: Never   Tobacco comments:    down to less than 1/2 ppd  Vaping Use   Vaping status: Never Used   Substance and Sexual Activity   Alcohol use: No    Alcohol/week: 0.0 standard drinks of alcohol   Drug use: No   Sexual activity: Never    Birth control/protection: None  Other Topics Concern   Not on file  Social History Narrative   Not on file   Social Drivers of Health   Financial Resource Strain: Not on file  Food Insecurity: Not on file  Transportation Needs: Not on file  Physical Activity: Not on file  Stress: Not on file  Social Connections: Not on file  Intimate Partner Violence: Not on file    Review of Systems: Gen: Denies any fever, chills, fatigue, weight loss, lack of appetite.  CV: Denies chest pain, heart palpitations, peripheral edema, syncope.  Resp: Denies shortness of breath at rest  or with exertion. Denies wheezing or cough.  GI: Denies dysphagia or odynophagia. Denies jaundice, hematemesis, fecal incontinence. GU : Denies urinary burning, urinary frequency, urinary hesitancy MS: Denies joint pain, muscle weakness, cramps, or limitation of movement.  Derm: Denies rash, itching, dry skin Psych: Denies depression, anxiety, memory loss, and confusion Heme: Denies bruising, bleeding, and enlarged lymph nodes.  Physical Exam: There were no vitals taken for this visit. General:   Alert and oriented. Pleasant and cooperative. Well-nourished and well-developed.  Head:  Normocephalic and atraumatic. Eyes:  Without icterus, sclera clear and conjunctiva pink.  Ears:  Normal auditory acuity. Lungs:  Clear to auscultation bilaterally. No wheezes, rales, or rhonchi. No distress.  Heart:  S1, S2 present without murmurs appreciated.  Abdomen:  +BS, soft, non-tender and non-distended. No HSM noted. No guarding or rebound. No masses appreciated.  Rectal:  Deferred  Msk:  Symmetrical without gross deformities. Normal posture. Extremities:  Without edema. Neurologic:  Alert and  oriented x4;  grossly normal neurologically. Skin:  Intact without significant lesions or  rashes. Psych:  Alert and cooperative. Normal mood and affect.    Assessment:     Plan:  ***   Ermalinda Memos, PA-C [facility] Gastroenterology 01/27/2024

## 2024-01-26 NOTE — Progress Notes (Signed)
 EPIC Encounter for ICM Monitoring  Patient Name: Nicole Clements is a 83 y.o. female Date: 01/26/2024 Primary Care Physican: Lupita Raider, MD Primary Cardiologist: Wyline Mood Electrophysiologist: Rae Roam Pacing: 94.3%  02/04/2023 Weight: 138 lbs 03/10/2023 Weight: 138 lbs 07/30/2023 Weight: 138 lbs  12/15/2023 Weight: 130 lbs 12/30/2023 Weight: 123 lbs   Clinical Status Since 27-Dec-2023 Time in AT/AF 0.0 hours/day (0.0 %)    Spoke with patient and heart failure questions reviewed.  Transmission results reviewed.  Pt reports swelling of leg and feet have improved and returning closer to baseline.     Diet:  Typically does not limit salt intake.    Optivol thoracic impedance trending close to baseline but suggesting minimal possible fluid accumulation remains.  Fluid index greater than normal threshold starting 1/31.   Prescribed:   Furosemide 20 mg Take 1 tablet (20 mg total) by mouth every day. You may take an extra 20 mg daily as needed for swelling (EPIC Prescription) but Per Pt, PCP prescribed 20 mg every other day. Potassium 20 mEq take 1 tablet by mouth daily Spironolactone 25 mg take 0.5 tablet (12.5 mg total) by mouth daily   Labs:  12/31/2023 Creatinine 1.46, BUN 15, Potassium 4.7, Sodium 136, GFR 36 11/19/2023 Creatinine 1.62, BUN 18, Potassium 4.7, Sodium 128, GFR 32 11/06/2023 Creatinine 1.69, BUN 29, Potassium 5.9, Sodium 130, GFR 30 10/29/2023 Creatinine 1.84, BUN 37, Potassium 5.9, Sodium 129, GFR 27 12/12/2022 Creatinine 1.47, BUN 16, Potassium 4.1, Sodium 133, GFR 36 A complete set of results can be found in Results Review.   Recommendations:   No changes and encouraged to call if experiencing any fluid symptoms.   Follow-up plan: ICM clinic phone appointment on 02/29/2024.  91 day device clinic remote scheduled 03/23/2024.   EP/Cardiology Office Visits:  03/21/2024 with Frutoso Schatz, NP.    Recall 03/05/2024 with Dr Ladona Ridgel.   Copy of ICM check sent to Dr. Ladona Ridgel.     3 month ICM trend: 01/25/2024.    12-14 Month ICM trend:     Karie Soda, RN 01/26/2024 8:30 AM

## 2024-01-27 ENCOUNTER — Ambulatory Visit (INDEPENDENT_AMBULATORY_CARE_PROVIDER_SITE_OTHER): Admitting: Gastroenterology

## 2024-01-27 ENCOUNTER — Encounter: Payer: Self-pay | Admitting: Gastroenterology

## 2024-01-27 VITALS — BP 108/73 | HR 99 | Temp 96.9°F | Ht 62.0 in | Wt 121.4 lb

## 2024-01-27 DIAGNOSIS — R197 Diarrhea, unspecified: Secondary | ICD-10-CM

## 2024-01-27 DIAGNOSIS — R634 Abnormal weight loss: Secondary | ICD-10-CM

## 2024-01-27 DIAGNOSIS — D509 Iron deficiency anemia, unspecified: Secondary | ICD-10-CM | POA: Diagnosis not present

## 2024-01-27 NOTE — Patient Instructions (Addendum)
 Please have blood work and stool testing completed at [facility].  Will be scheduled for a CT of your abdomen and pelvis to evaluate your weight loss and diarrhea.  Consume 3 meals a day and 2 snacks.   Add 1-2 protein shakes daily that are lactose free.  Fair life protein shakes are lactose free.   We will call you with your results and further recommendations.  Ermalinda Memos, PA-C [facility] Gastroenterology

## 2024-01-27 NOTE — Addendum Note (Signed)
 Addended by: Elease Etienne A on: 01/27/2024 11:54 AM   Modules accepted: Orders

## 2024-01-27 NOTE — Progress Notes (Signed)
 Remote ICD transmission.

## 2024-01-29 ENCOUNTER — Ambulatory Visit (HOSPITAL_COMMUNITY)
Admission: RE | Admit: 2024-01-29 | Discharge: 2024-01-29 | Disposition: A | Discharge status: Home / Self Care | Source: Ambulatory Visit | Attending: Gastroenterology | Admitting: Gastroenterology

## 2024-01-29 ENCOUNTER — Other Ambulatory Visit: Payer: Self-pay | Admitting: *Deleted

## 2024-01-29 ENCOUNTER — Encounter: Payer: Self-pay | Admitting: Gastroenterology

## 2024-01-29 DIAGNOSIS — R197 Diarrhea, unspecified: Secondary | ICD-10-CM

## 2024-01-29 DIAGNOSIS — R634 Abnormal weight loss: Secondary | ICD-10-CM | POA: Diagnosis not present

## 2024-01-29 DIAGNOSIS — K5730 Diverticulosis of large intestine without perforation or abscess without bleeding: Secondary | ICD-10-CM | POA: Diagnosis not present

## 2024-01-29 DIAGNOSIS — R935 Abnormal findings on diagnostic imaging of other abdominal regions, including retroperitoneum: Secondary | ICD-10-CM | POA: Diagnosis not present

## 2024-01-29 MED ORDER — IOHEXOL 9 MG/ML PO SOLN
ORAL | Status: AC
Start: 2024-01-29 — End: ?
  Filled 2024-01-29: qty 500

## 2024-01-29 MED ORDER — IOHEXOL 300 MG/ML  SOLN
100.0000 mL | Freq: Once | INTRAMUSCULAR | Status: AC | PRN
  Administered 2024-01-29: 80 mL via INTRAVENOUS

## 2024-01-29 NOTE — Addendum Note (Signed)
 Addended by: Faythe Dingwall on: 01/29/2024 11:32 AM   Modules accepted: Orders

## 2024-01-30 DIAGNOSIS — R197 Diarrhea, unspecified: Secondary | ICD-10-CM | POA: Diagnosis not present

## 2024-01-30 LAB — IRON,TIBC AND FERRITIN PANEL
Ferritin: 323 ng/mL — ABNORMAL HIGH (ref 15–150)
Iron Saturation: 19 % (ref 15–55)
Iron: 44 ug/dL (ref 27–139)
Total Iron Binding Capacity: 237 ug/dL — ABNORMAL LOW (ref 250–450)
UIBC: 193 ug/dL (ref 118–369)

## 2024-01-30 LAB — SPECIMEN STATUS REPORT

## 2024-01-31 LAB — CBC WITH DIFFERENTIAL/PLATELET
Basophils Absolute: 0 10*3/uL (ref 0.0–0.2)
Basos: 0 %
EOS (ABSOLUTE): 0.1 10*3/uL (ref 0.0–0.4)
Eos: 1 %
Hematocrit: 33 % — ABNORMAL LOW (ref 34.0–46.6)
Hemoglobin: 10.5 g/dL — ABNORMAL LOW (ref 11.1–15.9)
Immature Grans (Abs): 0 10*3/uL (ref 0.0–0.1)
Immature Granulocytes: 1 %
Lymphocytes Absolute: 2.2 10*3/uL (ref 0.7–3.1)
Lymphs: 27 %
MCH: 29.6 pg (ref 26.6–33.0)
MCHC: 31.8 g/dL (ref 31.5–35.7)
MCV: 93 fL (ref 79–97)
Monocytes Absolute: 0.5 10*3/uL (ref 0.1–0.9)
Monocytes: 7 %
Neutrophils Absolute: 5.2 10*3/uL (ref 1.4–7.0)
Neutrophils: 64 %
Platelets: 286 10*3/uL (ref 150–450)
RBC: 3.55 x10E6/uL — ABNORMAL LOW (ref 3.77–5.28)
RDW: 12.4 % (ref 11.7–15.4)
WBC: 8 10*3/uL (ref 3.4–10.8)

## 2024-01-31 LAB — BASIC METABOLIC PANEL WITH GFR
BUN/Creatinine Ratio: 13 (ref 12–28)
BUN: 17 mg/dL (ref 8–27)
CO2: 23 mmol/L (ref 20–29)
Calcium: 9.1 mg/dL (ref 8.7–10.3)
Chloride: 95 mmol/L — ABNORMAL LOW (ref 96–106)
Creatinine, Ser: 1.33 mg/dL — ABNORMAL HIGH (ref 0.57–1.00)
Glucose: 105 mg/dL — ABNORMAL HIGH (ref 70–99)
Potassium: 5.1 mmol/L (ref 3.5–5.2)
Sodium: 130 mmol/L — ABNORMAL LOW (ref 134–144)
eGFR: 40 mL/min/{1.73_m2} — ABNORMAL LOW (ref >59)

## 2024-01-31 LAB — IGA: IgA/Immunoglobulin A, Serum: 195 mg/dL (ref 64–422)

## 2024-01-31 LAB — TSH: TSH: 1.69 u[IU]/mL (ref 0.450–4.500)

## 2024-01-31 LAB — TISSUE TRANSGLUTAMINASE, IGA: Transglutaminase IgA: <2 U/mL (ref 0–3)

## 2024-02-01 ENCOUNTER — Other Ambulatory Visit: Payer: Self-pay | Admitting: *Deleted

## 2024-02-01 DIAGNOSIS — E871 Hypo-osmolality and hyponatremia: Secondary | ICD-10-CM

## 2024-02-03 ENCOUNTER — Other Ambulatory Visit: Payer: Self-pay | Admitting: Gastroenterology

## 2024-02-03 DIAGNOSIS — E871 Hypo-osmolality and hyponatremia: Secondary | ICD-10-CM | POA: Diagnosis not present

## 2024-02-03 DIAGNOSIS — R197 Diarrhea, unspecified: Secondary | ICD-10-CM | POA: Diagnosis not present

## 2024-02-03 DIAGNOSIS — K8681 Exocrine pancreatic insufficiency: Secondary | ICD-10-CM

## 2024-02-03 LAB — CLOSTRIDIUM DIFFICILE EIA: C difficile Toxins A+B, EIA: NEGATIVE

## 2024-02-03 LAB — GI PROFILE, STOOL, PCR

## 2024-02-03 LAB — PANCREATIC ELASTASE, FECAL: Pancreatic Elastase, Fecal: 64 ug Elast./g — ABNORMAL LOW (ref >200)

## 2024-02-03 MED ORDER — PANCRELIPASE (LIP-PROT-AMYL) 36000-114000 UNITS PO CPEP: ORAL_CAPSULE | ORAL | 11 refills | Status: DC

## 2024-02-04 LAB — BASIC METABOLIC PANEL WITH GFR
BUN/Creatinine Ratio: 11 — ABNORMAL LOW (ref 12–28)
BUN: 20 mg/dL (ref 8–27)
CO2: 21 mmol/L (ref 20–29)
Calcium: 9.1 mg/dL (ref 8.7–10.3)
Chloride: 94 mmol/L — ABNORMAL LOW (ref 96–106)
Creatinine, Ser: 1.75 mg/dL — ABNORMAL HIGH (ref 0.57–1.00)
Glucose: 105 mg/dL — ABNORMAL HIGH (ref 70–99)
Potassium: 4.7 mmol/L (ref 3.5–5.2)
Sodium: 133 mmol/L — ABNORMAL LOW (ref 134–144)
eGFR: 29 mL/min/{1.73_m2} — ABNORMAL LOW (ref >59)

## 2024-02-04 LAB — IRON,TIBC AND FERRITIN PANEL
Ferritin: 331 ng/mL — ABNORMAL HIGH (ref 15–150)
Iron Saturation: 12 % — ABNORMAL LOW (ref 15–55)
Iron: 29 ug/dL (ref 27–139)
Total Iron Binding Capacity: 233 ug/dL — ABNORMAL LOW (ref 250–450)
UIBC: 204 ug/dL (ref 118–369)

## 2024-02-08 ENCOUNTER — Other Ambulatory Visit: Payer: Self-pay | Admitting: Gastroenterology

## 2024-02-08 DIAGNOSIS — R9341 Abnormal radiologic findings on diagnostic imaging of renal pelvis, ureter, or bladder: Secondary | ICD-10-CM

## 2024-02-08 DIAGNOSIS — K8681 Exocrine pancreatic insufficiency: Secondary | ICD-10-CM

## 2024-02-08 DIAGNOSIS — N3289 Other specified disorders of bladder: Secondary | ICD-10-CM

## 2024-02-08 MED ORDER — ZENPEP 3000-10000 UNITS PO CPEP: ORAL_CAPSULE | ORAL | 5 refills | Status: DC

## 2024-02-09 ENCOUNTER — Ambulatory Visit: Payer: Medicare Other | Admitting: Cardiology

## 2024-02-10 ENCOUNTER — Other Ambulatory Visit: Payer: Self-pay | Admitting: Gastroenterology

## 2024-02-10 DIAGNOSIS — K8681 Exocrine pancreatic insufficiency: Secondary | ICD-10-CM

## 2024-02-10 MED ORDER — ZENPEP 40000-126000 UNITS PO CPEP: ORAL_CAPSULE | ORAL | 3 refills | Status: DC

## 2024-02-29 ENCOUNTER — Ambulatory Visit: Attending: Internal Medicine

## 2024-02-29 DIAGNOSIS — Z95810 Presence of automatic (implantable) cardiac defibrillator: Secondary | ICD-10-CM | POA: Diagnosis not present

## 2024-02-29 DIAGNOSIS — I5022 Chronic systolic (congestive) heart failure: Secondary | ICD-10-CM | POA: Diagnosis not present

## 2024-03-01 ENCOUNTER — Telehealth: Payer: Self-pay

## 2024-03-01 ENCOUNTER — Other Ambulatory Visit: Payer: Self-pay | Admitting: *Deleted

## 2024-03-01 DIAGNOSIS — D7389 Other diseases of spleen: Secondary | ICD-10-CM

## 2024-03-01 NOTE — Addendum Note (Signed)
 Addended by: Alvester Johnson on: 03/01/2024 03:34 PM   Modules accepted: Orders

## 2024-03-01 NOTE — Progress Notes (Signed)
 EPIC Encounter for ICM Monitoring  Patient Name: Nicole Clements is a 83 y.o. female Date: 03/01/2024 Primary Care Physican: Glena Landau, MD Primary Cardiologist: Amanda Jungling Electrophysiologist: Arvid Latino Pacing: 92.3%  02/04/2023 Weight: 138 lbs 03/10/2023 Weight: 138 lbs 07/30/2023 Weight: 138 lbs  12/15/2023 Weight: 130 lbs 12/30/2023 Weight: 123 lbs 01/27/2024 Office Weight: 121 lbs   Clinical Status Since 24-Jan-2024 Time in AT/AF   <0.1 hours/day (<0.1 %)   Attempted call to patient and unable to reach.  Transmission results reviewed.    Diet:  Typically does not limit salt intake.    Optivol thoracic impedance suggesting chronic ongoing possible fluid accumulation starting 1/18 that results in occasional swelling of the feet.  Fluid index greater than normal threshold starting 1/31.   Prescribed:   Furosemide  20 mg Take 1 tablet (20 mg total) by mouth every day. You may take an extra 20 mg daily as needed for swelling (EPIC Prescription) but Per Pt, PCP prescribed 20 mg every other day. Potassium 20 mEq take 1 tablet by mouth daily Spironolactone  25 mg take 0.5 tablet (12.5 mg total) by mouth daily   Labs:  02/03/2024 Creatinine 1.75, BUN 20, Potassium 4.7, Sodium 133, GFR 29 01/27/2024 Creatinine 1.33, BUN 17, Potassium 5.1, Sodium 130, GFR 40 12/31/2023 Creatinine 1.46, BUN 15, Potassium 4.7, Sodium 136, GFR 36 11/19/2023 Creatinine 1.62, BUN 18, Potassium 4.7, Sodium 128, GFR 32 A complete set of results can be found in Results Review.   Recommendations:   Unable to reach.     Follow-up plan: ICM clinic phone appointment on 04/04/2024.  91 day device clinic remote scheduled 03/23/2024.   EP/Cardiology Office Visits:  03/21/2024 with Jaylene Metz, NP.    Recall 03/05/2024 with Dr Carolynne Citron.   Copy of ICM check sent to Dr. Carolynne Citron.    3 month ICM trend: 02/29/2024.    12-14 Month ICM trend:     Almyra Jain, RN 03/01/2024 4:31 PM

## 2024-03-01 NOTE — Addendum Note (Signed)
 Addended by: Alvester Johnson on: 03/01/2024 03:37 PM   Modules accepted: Orders

## 2024-03-01 NOTE — Telephone Encounter (Signed)
 Remote ICM transmission received.  Attempted call to patient regarding ICM remote transmission and no answer.

## 2024-03-04 ENCOUNTER — Telehealth: Payer: Self-pay | Admitting: *Deleted

## 2024-03-04 NOTE — Telephone Encounter (Signed)
 Great! Please make sure patient is aware.

## 2024-03-04 NOTE — Telephone Encounter (Signed)
 Pt was approved for Zenpep . Informed pt.

## 2024-03-09 ENCOUNTER — Telehealth: Payer: Self-pay | Admitting: *Deleted

## 2024-03-09 NOTE — Telephone Encounter (Signed)
 Spoke to pt informed her that Zenpep  was here  and she needs to come pick it up. Pt voiced understanding. Pt states she will be here tomorrow to get medications. They are locked in the cabinet up front.

## 2024-03-09 NOTE — Telephone Encounter (Signed)
Noted. Thank you for looking into this.

## 2024-03-09 NOTE — Telephone Encounter (Signed)
 Was informed this is not possible. Pt has to come to office.

## 2024-03-09 NOTE — Telephone Encounter (Signed)
 Noted. Can they start mailing this to her rather than our office for convenience of the patient?

## 2024-03-14 NOTE — Progress Notes (Deleted)
 Referring Provider: Glena Landau, MD Primary Care Physician:  Glena Landau, MD Primary GI Physician: Dr. Jenney Modest chief complaint on file.   HPI:   Nicole Clements is a 83 y.o. female with history of CAD, CHF with severely reduced EF of 25 to 30% in 2022, ICD in place, HLD, HTN, presenting today for follow-up of diarrhea and weight loss.   Prior workup in 2017 for diarrhea and weight loss showed TSH low at 0.1.  Stool studies negative.  Colonoscopy with benign colon biopsies.  Symptoms resolved at that time with Lactaid tablets and avoiding dietary triggers.  Last seen in the office 01/27/2024 for evaluation of diarrhea and weight loss at the request of Dr. Amanda Jungling.  She reported couple year history of diarrhea with [place] 7 stools, frequency depending on what she ate having anywhere from 1-3 bowel movements per day or occasionally skipping a day without a bowel movement.  Did okay if eating chicken, fish, beef, but black-eyed peas, corn, pinto beans, green led to diarrhea typically within 15 to 20 minutes of eating.  Denied abdominal pain, rectal bleeding, nocturnal stools, excessive gas.  She was not taking anything for diarrhea.  Associated weight loss as she was not eating as much because she did not have diarrhea.  Stated she would like to eat, but limited intake due to diarrhea.  No nausea or vomiting.  Recommended updating labs, stool testing, CT, avoid dietary triggers, increase caloric intake, 1-2 lactose-free protein shakes daily.  Labs with sodium 130, hemoglobin 10.5 (stable) without iron deficiency.  Fecal elastase was low at 64.  Celiac screen negative.  TSH within normal limits.  C. difficile and GI pathogen panel negative.  Recommended starting pancreatic enzymes.  CT A/P with contrast 01/29/2024 with foci of gas in the urinary bladder with associated bladder wall thickening, bilateral urothelial thickening, interval development of 2.3 cm splenic hypodensity with  recommendations for MRI.  MRI is scheduled for 03/24/2024.   Today:  Diarrhea/EPI: Zenpep  samples were not picked up until 03/02/2024.  Also submitted for patient assistance due to cost which was approved.  Not   Weight loss:    Past Medical History:  Diagnosis Date   AICD (automatic cardioverter/defibrillator) present    CAD (coronary artery disease) April 2014   DES to PLA, occluded PDA 07/2014   CHF (congestive heart failure) (HCC)    Hypercholesterolemia    Hypertension    Ischemic cardiomyopathy    LVEF 25%-45%   IVCD (intraventricular conduction defect)    PVC's (premature ventricular contractions)    S/P colonoscopy August 2007   Hyperplastic polyps, rare sigmoid and descending colon diverticulosis, small internal hemorrhoids   STEMI (ST elevation myocardial infarction) (HCC) 07/22/1996    Past Surgical History:  Procedure Laterality Date   ABDOMINAL HYSTERECTOMY     BIV ICD GENERATOR CHANGEOUT N/A 12/22/2022   Procedure: BIV ICD GENERATOR CHANGEOUT;  Surgeon: Tammie Fall, MD;  Location: [facility] INVASIVE CV LAB;  Service: Cardiovascular;  Laterality: N/A;   BREAST CYST EXCISION Left    unsure when   CARDIAC CATHETERIZATION  02/2013   Med Rx   CATARACT EXTRACTION W/PHACO Right 09/15/2017   Procedure: CATARACT EXTRACTION PHACO AND INTRAOCULAR LENS PLACEMENT (IOC);  Surgeon: Albert Huff, MD;  Location: [facility] ORS;  Service: Ophthalmology;  Laterality: Right;  CDE: 12.22   CATARACT EXTRACTION W/PHACO Left 09/29/2017   Procedure: CATARACT EXTRACTION PHACO AND INTRAOCULAR LENS PLACEMENT (IOC);  Surgeon: Albert Huff, MD;  Location: [facility]  ORS;  Service: Ophthalmology;  Laterality: Left;  CDE: 10.02   COLONOSCOPY N/A 12/24/2015   Dr. Fields:moderate sized internal hemorrhoids/moderate diverticulosis, negative microscopic colitis    EP IMPLANTABLE DEVICE  04/18/2015   BV ICD   EP IMPLANTABLE DEVICE N/A 04/18/2015   Procedure: BiV ICD Insertion CRT-D;  Surgeon: Verona Goodwill, MD;   Location: [facility] INVASIVE CV LAB;  Service: Cardiovascular;  Laterality: N/A;   LEFT AND RIGHT HEART CATHETERIZATION WITH CORONARY ANGIOGRAM N/A 07/17/2014   Procedure: LEFT AND RIGHT HEART CATHETERIZATION WITH CORONARY ANGIOGRAM;  Surgeon: Arlander Bellman, MD;  Location: [facility] CATH LAB;  Service: Cardiovascular;  Laterality: N/A;   PERCUTANEOUS CORONARY STENT INTERVENTION (PCI-S)  07/17/2014   Procedure: PERCUTANEOUS CORONARY STENT INTERVENTION (PCI-S);  Surgeon: Arlander Bellman, MD;  Location: [facility] CATH LAB;  Service: Cardiovascular;;  Distal RCA   S/P Hysterectomy      Current Outpatient Medications  Medication Sig Dispense Refill   acetaminophen  (TYLENOL ) 325 MG tablet Take by mouth.     aspirin  EC 81 MG tablet Take 1 tablet (81 mg total) by mouth daily. 90 tablet 3   atorvastatin  (LIPITOR) 80 MG tablet Take 1 tablet (80 mg total) by mouth daily. 90 tablet 3   carvedilol  (COREG ) 25 MG tablet TAKE 1 TABLET BY MOUTH TWICE DAILY 60 tablet 6   Cholecalciferol  (VITAMIN D3) 2000 UNITS capsule Take 2,000 Units by mouth daily.     furosemide  (LASIX ) 20 MG tablet TAKE 1 TABLET BY MOUTH EVERY DAY. MAY TAKE AN ADDITIONAL TABLET EVERY DAY AS NEEDED FOR SWELLING (Patient taking differently: Take 20 mg by mouth every other day. TAKE 1 TABLET BY MOUTH EVERY DAY. MAY TAKE AN ADDITIONAL TABLET EVERY DAY AS NEEDED FOR SWELLING.) 100 tablet 1   losartan  (COZAAR ) 25 MG tablet TAKE 1 TABLET(25 MG) BY MOUTH DAILY 90 tablet 3   magnesium  oxide (MAG-OX) 400 MG tablet Take 1 tablet by mouth twice daily for 5 days, THEN decrease to 1 tablet by mouth once daily 100 tablet 1   nitroGLYCERIN  (NITROSTAT ) 0.4 MG SL tablet Place 1 tablet (0.4 mg total) under the tongue every 5 (five) minutes x 3 doses as needed for chest pain. 25 tablet 2   Omega-3 Fatty Acids (FISH OIL) 1200 MG CAPS Take 1,200 mg by mouth daily.     Pancrelipase , Lip-Prot-Amyl, (ZENPEP ) 40000-126000 units CPEP Take 2 capsules with meals 3 times a day and 1  capsule with snacks up to twice a day. 240 capsule 3   potassium chloride  SA (KLOR-CON  M) 20 MEQ tablet TAKE 1 TABLET(20 MEQ) BY MOUTH DAILY 90 tablet 1   spironolactone  (ALDACTONE ) 25 MG tablet Take 0.5 tablets (12.5 mg total) by mouth daily. 45 tablet 3   No current facility-administered medications for this visit.    Allergies as of 03/16/2024 - Review Complete 01/27/2024  Allergen Reaction Noted   Lactose intolerance (gi) Other (See Comments) 04/29/2015   Sulfa antibiotics Rash 07/16/2011    Family History  Problem Relation Age of Onset   Hypertension Sister    Diabetes Mellitus II Sister    Hypertension Brother    Diabetes Mellitus II Brother    Hypertension Brother    Diabetes Mellitus II Brother    Hypertension Brother    Diabetes Mellitus II Brother    Colon cancer Neg Hx     Social History   Socioeconomic History   Marital status: Single    Spouse name: Not on file   Number of  children: 1   Years of education: Not on file   Highest education level: Not on file  Occupational History   Occupation: Retired    Comment: Customer service rep  Tobacco Use   Smoking status: Every Day    Current packs/day: 0.50    Average packs/day: 0.5 packs/day for 62.0 years (31.0 ttl pk-yrs)    Types: Cigarettes    Start date: 02/26/1962   Smokeless tobacco: Never   Tobacco comments:    down to less than 1/2 ppd  Vaping Use   Vaping status: Never Used  Substance and Sexual Activity   Alcohol use: No    Alcohol/week: 0.0 standard drinks of alcohol   Drug use: No   Sexual activity: Never    Birth control/protection: None  Other Topics Concern   Not on file  Social History Narrative   Not on file   Social Drivers of Health   Financial Resource Strain: Not on file  Food Insecurity: Not on file  Transportation Needs: Not on file  Physical Activity: Not on file  Stress: Not on file  Social Connections: Not on file    Review of Systems: Gen: Denies fever, chills,  anorexia. Denies fatigue, weakness, weight loss.  CV: Denies chest pain, palpitations, syncope, peripheral edema, and claudication. Resp: Denies dyspnea at rest, cough, wheezing, coughing up blood, and pleurisy. GI: Denies vomiting blood, jaundice, and fecal incontinence.   Denies dysphagia or odynophagia. Derm: Denies rash, itching, dry skin Psych: Denies depression, anxiety, memory loss, confusion. No homicidal or suicidal ideation.  Heme: Denies bruising, bleeding, and enlarged lymph nodes.  Physical Exam: There were no vitals taken for this visit. General:   Alert and oriented. No distress noted. Pleasant and cooperative.  Head:  Normocephalic and atraumatic. Eyes:  Conjuctiva clear without scleral icterus. Heart:  S1, S2 present without murmurs appreciated. Lungs:  Clear to auscultation bilaterally. No wheezes, rales, or rhonchi. No distress.  Abdomen:  +BS, soft, non-tender and non-distended. No rebound or guarding. No HSM or masses noted. Msk:  Symmetrical without gross deformities. Normal posture. Extremities:  Without edema. Neurologic:  Alert and  oriented x4 Psych:  Normal mood and affect.    Assessment:     Plan:  ***   Shana Daring, PA-C [facility] 03/16/2024

## 2024-03-16 ENCOUNTER — Ambulatory Visit: Admitting: Gastroenterology

## 2024-03-17 ENCOUNTER — Encounter: Payer: Self-pay | Admitting: Gastroenterology

## 2024-03-21 ENCOUNTER — Ambulatory Visit: Payer: Medicare Other | Admitting: Cardiology

## 2024-03-22 NOTE — CV Procedure (Signed)
  Device system confirmed to be MRI conditional, with implant date > 6 weeks ago, and no evidence of abandoned or epicardial leads in review of most recent CXR  Device last cleared by EP Provider: Valeri Gate, PA on 03/17/24  Clearance is good through for 1 year as long as parameters remain stable at time of check. If pt undergoes a cardiac device procedure during that time, they should be re-cleared.   Tachy-therapies to be programmed off if applicable with device back to pre-MRI settings after completion of exam.  [employer] - Programming recommendation received through [employer] App/Tablet  Buster Cash, RT  03/22/2024 5:05 PM

## 2024-03-23 ENCOUNTER — Ambulatory Visit: Payer: Medicare Other

## 2024-03-23 DIAGNOSIS — I255 Ischemic cardiomyopathy: Secondary | ICD-10-CM

## 2024-03-24 ENCOUNTER — Ambulatory Visit (HOSPITAL_COMMUNITY): Admission: RE | Admit: 2024-03-24 | Source: Ambulatory Visit

## 2024-03-24 ENCOUNTER — Emergency Department (HOSPITAL_COMMUNITY)

## 2024-03-24 ENCOUNTER — Other Ambulatory Visit: Payer: Self-pay

## 2024-03-24 ENCOUNTER — Inpatient Hospital Stay (HOSPITAL_COMMUNITY)
Admission: EM | Admit: 2024-03-24 | Discharge: 2024-04-03 | DRG: 871 | Disposition: E | Attending: Internal Medicine | Admitting: Internal Medicine

## 2024-03-24 DIAGNOSIS — I509 Heart failure, unspecified: Secondary | ICD-10-CM

## 2024-03-24 DIAGNOSIS — W19XXXA Unspecified fall, initial encounter: Secondary | ICD-10-CM | POA: Diagnosis present

## 2024-03-24 DIAGNOSIS — J189 Pneumonia, unspecified organism: Secondary | ICD-10-CM | POA: Diagnosis present

## 2024-03-24 DIAGNOSIS — D696 Thrombocytopenia, unspecified: Secondary | ICD-10-CM

## 2024-03-24 DIAGNOSIS — I255 Ischemic cardiomyopathy: Secondary | ICD-10-CM | POA: Diagnosis present

## 2024-03-24 DIAGNOSIS — Z1152 Encounter for screening for COVID-19: Secondary | ICD-10-CM

## 2024-03-24 DIAGNOSIS — E7800 Pure hypercholesterolemia, unspecified: Secondary | ICD-10-CM | POA: Diagnosis present

## 2024-03-24 DIAGNOSIS — A419 Sepsis, unspecified organism: Principal | ICD-10-CM | POA: Diagnosis present

## 2024-03-24 DIAGNOSIS — C3411 Malignant neoplasm of upper lobe, right bronchus or lung: Secondary | ICD-10-CM | POA: Diagnosis not present

## 2024-03-24 DIAGNOSIS — R531 Weakness: Secondary | ICD-10-CM | POA: Diagnosis not present

## 2024-03-24 DIAGNOSIS — J9601 Acute respiratory failure with hypoxia: Principal | ICD-10-CM | POA: Diagnosis present

## 2024-03-24 DIAGNOSIS — R918 Other nonspecific abnormal finding of lung field: Secondary | ICD-10-CM | POA: Diagnosis not present

## 2024-03-24 DIAGNOSIS — Y92008 Other place in unspecified non-institutional (private) residence as the place of occurrence of the external cause: Secondary | ICD-10-CM

## 2024-03-24 DIAGNOSIS — N390 Urinary tract infection, site not specified: Secondary | ICD-10-CM | POA: Diagnosis not present

## 2024-03-24 DIAGNOSIS — I462 Cardiac arrest due to underlying cardiac condition: Secondary | ICD-10-CM | POA: Diagnosis not present

## 2024-03-24 DIAGNOSIS — R6520 Severe sepsis without septic shock: Secondary | ICD-10-CM | POA: Diagnosis not present

## 2024-03-24 DIAGNOSIS — J440 Chronic obstructive pulmonary disease with acute lower respiratory infection: Secondary | ICD-10-CM | POA: Diagnosis present

## 2024-03-24 DIAGNOSIS — Z66 Do not resuscitate: Secondary | ICD-10-CM | POA: Diagnosis not present

## 2024-03-24 DIAGNOSIS — Z833 Family history of diabetes mellitus: Secondary | ICD-10-CM

## 2024-03-24 DIAGNOSIS — I472 Ventricular tachycardia, unspecified: Secondary | ICD-10-CM | POA: Diagnosis present

## 2024-03-24 DIAGNOSIS — C771 Secondary and unspecified malignant neoplasm of intrathoracic lymph nodes: Secondary | ICD-10-CM | POA: Diagnosis not present

## 2024-03-24 DIAGNOSIS — I6381 Other cerebral infarction due to occlusion or stenosis of small artery: Secondary | ICD-10-CM | POA: Diagnosis not present

## 2024-03-24 DIAGNOSIS — Z7982 Long term (current) use of aspirin: Secondary | ICD-10-CM

## 2024-03-24 DIAGNOSIS — I639 Cerebral infarction, unspecified: Secondary | ICD-10-CM | POA: Diagnosis present

## 2024-03-24 DIAGNOSIS — I700 Atherosclerosis of aorta: Secondary | ICD-10-CM | POA: Diagnosis not present

## 2024-03-24 DIAGNOSIS — E876 Hypokalemia: Secondary | ICD-10-CM | POA: Diagnosis present

## 2024-03-24 DIAGNOSIS — C7951 Secondary malignant neoplasm of bone: Secondary | ICD-10-CM | POA: Diagnosis present

## 2024-03-24 DIAGNOSIS — M5030 Other cervical disc degeneration, unspecified cervical region: Secondary | ICD-10-CM | POA: Diagnosis not present

## 2024-03-24 DIAGNOSIS — Z95810 Presence of automatic (implantable) cardiac defibrillator: Secondary | ICD-10-CM

## 2024-03-24 DIAGNOSIS — E872 Acidosis, unspecified: Secondary | ICD-10-CM | POA: Diagnosis present

## 2024-03-24 DIAGNOSIS — I5022 Chronic systolic (congestive) heart failure: Secondary | ICD-10-CM | POA: Diagnosis not present

## 2024-03-24 DIAGNOSIS — I110 Hypertensive heart disease with heart failure: Secondary | ICD-10-CM | POA: Diagnosis not present

## 2024-03-24 DIAGNOSIS — Z955 Presence of coronary angioplasty implant and graft: Secondary | ICD-10-CM

## 2024-03-24 DIAGNOSIS — D65 Disseminated intravascular coagulation [defibrination syndrome]: Secondary | ICD-10-CM | POA: Diagnosis present

## 2024-03-24 DIAGNOSIS — Z79899 Other long term (current) drug therapy: Secondary | ICD-10-CM

## 2024-03-24 DIAGNOSIS — R410 Disorientation, unspecified: Secondary | ICD-10-CM | POA: Diagnosis not present

## 2024-03-24 DIAGNOSIS — Z8249 Family history of ischemic heart disease and other diseases of the circulatory system: Secondary | ICD-10-CM

## 2024-03-24 DIAGNOSIS — I82441 Acute embolism and thrombosis of right tibial vein: Secondary | ICD-10-CM | POA: Diagnosis present

## 2024-03-24 DIAGNOSIS — F17200 Nicotine dependence, unspecified, uncomplicated: Secondary | ICD-10-CM | POA: Diagnosis present

## 2024-03-24 DIAGNOSIS — F17210 Nicotine dependence, cigarettes, uncomplicated: Secondary | ICD-10-CM | POA: Diagnosis not present

## 2024-03-24 DIAGNOSIS — I130 Hypertensive heart and chronic kidney disease with heart failure and stage 1 through stage 4 chronic kidney disease, or unspecified chronic kidney disease: Secondary | ICD-10-CM | POA: Diagnosis not present

## 2024-03-24 DIAGNOSIS — I517 Cardiomegaly: Secondary | ICD-10-CM | POA: Diagnosis not present

## 2024-03-24 DIAGNOSIS — Z882 Allergy status to sulfonamides status: Secondary | ICD-10-CM

## 2024-03-24 DIAGNOSIS — I2510 Atherosclerotic heart disease of native coronary artery without angina pectoris: Secondary | ICD-10-CM | POA: Diagnosis present

## 2024-03-24 DIAGNOSIS — R0609 Other forms of dyspnea: Secondary | ICD-10-CM | POA: Diagnosis not present

## 2024-03-24 DIAGNOSIS — Z8673 Personal history of transient ischemic attack (TIA), and cerebral infarction without residual deficits: Secondary | ICD-10-CM

## 2024-03-24 DIAGNOSIS — E739 Lactose intolerance, unspecified: Secondary | ICD-10-CM | POA: Diagnosis present

## 2024-03-24 DIAGNOSIS — Z90710 Acquired absence of both cervix and uterus: Secondary | ICD-10-CM

## 2024-03-24 DIAGNOSIS — R17 Unspecified jaundice: Secondary | ICD-10-CM | POA: Diagnosis not present

## 2024-03-24 DIAGNOSIS — I252 Old myocardial infarction: Secondary | ICD-10-CM

## 2024-03-24 DIAGNOSIS — R7989 Other specified abnormal findings of blood chemistry: Secondary | ICD-10-CM

## 2024-03-24 DIAGNOSIS — N1832 Chronic kidney disease, stage 3b: Secondary | ICD-10-CM | POA: Diagnosis present

## 2024-03-24 DIAGNOSIS — G9389 Other specified disorders of brain: Secondary | ICD-10-CM | POA: Diagnosis not present

## 2024-03-24 DIAGNOSIS — I469 Cardiac arrest, cause unspecified: Secondary | ICD-10-CM | POA: Diagnosis not present

## 2024-03-24 DIAGNOSIS — R4182 Altered mental status, unspecified: Secondary | ICD-10-CM | POA: Diagnosis not present

## 2024-03-24 DIAGNOSIS — R059 Cough, unspecified: Secondary | ICD-10-CM | POA: Diagnosis not present

## 2024-03-24 DIAGNOSIS — R9082 White matter disease, unspecified: Secondary | ICD-10-CM | POA: Diagnosis not present

## 2024-03-24 LAB — DIC (DISSEMINATED INTRAVASCULAR COAGULATION)PANEL
D-Dimer, Quant: >20 ug{FEU}/mL — ABNORMAL HIGH (ref 0.00–0.50)
Fibrinogen: 60 mg/dL — CL (ref 210–475)
INR: 2.5 — ABNORMAL HIGH (ref 0.8–1.2)
Platelets: 31 10*3/uL — ABNORMAL LOW (ref 150–400)
Prothrombin Time: 26.8 s — ABNORMAL HIGH (ref 11.4–15.2)
Smear Review: NONE SEEN
aPTT: 51 s — ABNORMAL HIGH (ref 24–36)

## 2024-03-24 LAB — BRAIN NATRIURETIC PEPTIDE: B Natriuretic Peptide: 2197 pg/mL — ABNORMAL HIGH (ref 0.0–100.0)

## 2024-03-24 LAB — CUP PACEART REMOTE DEVICE CHECK
Battery Remaining Longevity: 80 mo
Battery Voltage: 2.98 V
Brady Statistic RV Percent Paced: 0.54 %
Date Time Interrogation Session: 20250520203112
HighPow Impedance: 45 Ohm
Implantable Lead Connection Status: 753985
Implantable Lead Connection Status: 753985
Implantable Lead Connection Status: 753985
Implantable Lead Implant Date: 20160615
Implantable Lead Implant Date: 20160615
Implantable Lead Implant Date: 20160615
Implantable Lead Location: 753858
Implantable Lead Location: 753859
Implantable Lead Location: 753860
Implantable Lead Model: 4398
Implantable Lead Model: 5076
Implantable Pulse Generator Implant Date: 20240219
Lead Channel Impedance Value: 285 Ohm
Lead Channel Impedance Value: 285 Ohm
Lead Channel Impedance Value: 323 Ohm
Lead Channel Impedance Value: 361 Ohm
Lead Channel Impedance Value: 361 Ohm
Lead Channel Impedance Value: 380 Ohm
Lead Channel Impedance Value: 399 Ohm
Lead Channel Impedance Value: 475 Ohm
Lead Channel Impedance Value: 532 Ohm
Lead Channel Impedance Value: 551 Ohm
Lead Channel Impedance Value: 703 Ohm
Lead Channel Impedance Value: 722 Ohm
Lead Channel Impedance Value: 722 Ohm
Lead Channel Pacing Threshold Amplitude: 0.5 V
Lead Channel Pacing Threshold Amplitude: 0.75 V
Lead Channel Pacing Threshold Amplitude: 0.75 V
Lead Channel Pacing Threshold Pulse Width: 0.4 ms
Lead Channel Pacing Threshold Pulse Width: 0.4 ms
Lead Channel Pacing Threshold Pulse Width: 0.6 ms
Lead Channel Sensing Intrinsic Amplitude: 13.1 mV
Lead Channel Sensing Intrinsic Amplitude: 2.1 mV
Lead Channel Setting Pacing Amplitude: 1.5 V
Lead Channel Setting Pacing Amplitude: 2 V
Lead Channel Setting Pacing Amplitude: 2.5 V
Lead Channel Setting Pacing Pulse Width: 0.4 ms
Lead Channel Setting Pacing Pulse Width: 0.6 ms
Lead Channel Setting Sensing Sensitivity: 0.45 mV
Zone Setting Status: 755011
Zone Setting Status: 755011

## 2024-03-24 LAB — CBC WITH DIFFERENTIAL/PLATELET
Abs Immature Granulocytes: 0.22 10*3/uL — ABNORMAL HIGH (ref 0.00–0.07)
Basophils Absolute: 0 10*3/uL (ref 0.0–0.1)
Basophils Relative: 0 %
Eosinophils Absolute: 0 10*3/uL (ref 0.0–0.5)
Eosinophils Relative: 0 %
HCT: 40.7 % (ref 36.0–46.0)
Hemoglobin: 12.4 g/dL (ref 12.0–15.0)
Immature Granulocytes: 2 %
Lymphocytes Relative: 12 %
Lymphs Abs: 1.8 10*3/uL (ref 0.7–4.0)
MCH: 29.2 pg (ref 26.0–34.0)
MCHC: 30.5 g/dL (ref 30.0–36.0)
MCV: 95.8 fL (ref 80.0–100.0)
Monocytes Absolute: 1 10*3/uL (ref 0.1–1.0)
Monocytes Relative: 7 %
Neutro Abs: 11.9 10*3/uL — ABNORMAL HIGH (ref 1.7–7.7)
Neutrophils Relative %: 79 %
Platelets: 34 10*3/uL — ABNORMAL LOW (ref 150–400)
RBC: 4.25 MIL/uL (ref 3.87–5.11)
RDW: 17.7 % — ABNORMAL HIGH (ref 11.5–15.5)
Smear Review: NORMAL
WBC: 14.9 10*3/uL — ABNORMAL HIGH (ref 4.0–10.5)
nRBC: 0.2 % (ref 0.0–0.2)

## 2024-03-24 LAB — LACTIC ACID, PLASMA
Lactic Acid, Venous: 2.4 mmol/L (ref 0.5–1.9)
Lactic Acid, Venous: 2.1 mmol/L (ref 0.5–1.9)

## 2024-03-24 LAB — RESP PANEL BY RT-PCR (RSV, FLU A&B, COVID)  RVPGX2
Influenza A by PCR: NEGATIVE
Influenza B by PCR: NEGATIVE
Resp Syncytial Virus by PCR: NEGATIVE
SARS Coronavirus 2 by RT PCR: NEGATIVE

## 2024-03-24 LAB — LIPASE, BLOOD: Lipase: 27 U/L (ref 11–51)

## 2024-03-24 LAB — COMPREHENSIVE METABOLIC PANEL WITH GFR
ALT: 12 U/L (ref 0–44)
AST: 20 U/L (ref 15–41)
Albumin: 3.2 g/dL — ABNORMAL LOW (ref 3.5–5.0)
Alkaline Phosphatase: 100 U/L (ref 38–126)
Anion gap: 13 (ref 5–15)
BUN: 37 mg/dL — ABNORMAL HIGH (ref 8–23)
CO2: 23 mmol/L (ref 22–32)
Calcium: 9 mg/dL (ref 8.9–10.3)
Chloride: 102 mmol/L (ref 98–111)
Creatinine, Ser: 1.66 mg/dL — ABNORMAL HIGH (ref 0.44–1.00)
GFR, Estimated: 31 mL/min — ABNORMAL LOW (ref >60)
Glucose, Bld: 121 mg/dL — ABNORMAL HIGH (ref 70–99)
Potassium: 3.6 mmol/L (ref 3.5–5.1)
Sodium: 138 mmol/L (ref 135–145)
Total Bilirubin: 3.6 mg/dL — ABNORMAL HIGH (ref 0.0–1.2)
Total Protein: 6.5 g/dL (ref 6.5–8.1)

## 2024-03-24 LAB — URINALYSIS, ROUTINE W REFLEX MICROSCOPIC
Bilirubin Urine: NEGATIVE
Glucose, UA: NEGATIVE mg/dL
Ketones, ur: NEGATIVE mg/dL
Nitrite: POSITIVE — AB
Protein, ur: 30 mg/dL — AB
Specific Gravity, Urine: 1.02 (ref 1.005–1.030)
WBC, UA: >50 WBC/hpf (ref 0–5)
pH: 5 (ref 5.0–8.0)

## 2024-03-24 LAB — CK: Total CK: 68 U/L (ref 38–234)

## 2024-03-24 LAB — PROCALCITONIN: Procalcitonin: 0.16 ng/mL

## 2024-03-24 LAB — D-DIMER, QUANTITATIVE: D-Dimer, Quant: >20 ug{FEU}/mL — ABNORMAL HIGH (ref 0.00–0.50)

## 2024-03-24 MED ORDER — SODIUM CHLORIDE 0.9 % IV SOLN
1.0000 g | Freq: Once | INTRAVENOUS | Status: AC
  Administered 2024-03-24: 1 g via INTRAVENOUS
  Filled 2024-03-24: qty 10

## 2024-03-24 MED ORDER — LACTATED RINGERS IV BOLUS
500.0000 mL | Freq: Once | INTRAVENOUS | Status: AC
Start: 2024-03-24 — End: 2024-03-24
  Administered 2024-03-24: 500 mL via INTRAVENOUS

## 2024-03-24 MED ORDER — ALBUTEROL SULFATE (2.5 MG/3ML) 0.083% IN NEBU: 2.5000 mg | INHALATION_SOLUTION | RESPIRATORY_TRACT | Status: DC | PRN

## 2024-03-24 MED ORDER — ACETAMINOPHEN 325 MG PO TABS: 650.0000 mg | ORAL_TABLET | Freq: Four times a day (QID) | ORAL | Status: DC | PRN

## 2024-03-24 MED ORDER — ACETAMINOPHEN 650 MG RE SUPP: 650.0000 mg | Freq: Four times a day (QID) | RECTAL | Status: DC | PRN

## 2024-03-24 MED ORDER — NICOTINE 14 MG/24HR TD PT24
14.0000 mg | MEDICATED_PATCH | Freq: Every day | TRANSDERMAL | Status: DC
  Administered 2024-03-24 – 2024-03-27 (×4): 14 mg via TRANSDERMAL
  Filled 2024-03-24 (×4): qty 1

## 2024-03-24 MED ORDER — SODIUM CHLORIDE 0.9 % IV SOLN
2.0000 g | Freq: Once | INTRAVENOUS | Status: AC
  Administered 2024-03-25: 2 g via INTRAVENOUS
  Filled 2024-03-24: qty 20

## 2024-03-24 MED ORDER — ATORVASTATIN CALCIUM 40 MG PO TABS
80.0000 mg | ORAL_TABLET | Freq: Every day | ORAL | Status: DC
  Administered 2024-03-25: 80 mg via ORAL
  Filled 2024-03-24: qty 2

## 2024-03-24 MED ORDER — NICOTINE 14 MG/24HR TD PT24
14.0000 mg | MEDICATED_PATCH | Freq: Every day | TRANSDERMAL | Status: DC
Start: 2024-03-24 — End: 2024-03-24

## 2024-03-24 MED ORDER — SODIUM CHLORIDE 0.9 % IV SOLN
500.0000 mg | Freq: Once | INTRAVENOUS | Status: AC
  Administered 2024-03-25: 500 mg via INTRAVENOUS
  Filled 2024-03-24: qty 5

## 2024-03-24 MED ORDER — FUROSEMIDE 10 MG/ML IJ SOLN
20.0000 mg | Freq: Once | INTRAMUSCULAR | Status: AC
  Administered 2024-03-24: 20 mg via INTRAVENOUS
  Filled 2024-03-24: qty 2

## 2024-03-24 MED ORDER — IOHEXOL 350 MG/ML SOLN
80.0000 mL | Freq: Once | INTRAVENOUS | Status: AC | PRN
  Administered 2024-03-24: 75 mL via INTRAVENOUS

## 2024-03-24 MED ORDER — AZITHROMYCIN 500 MG IV SOLR
500.0000 mg | Freq: Once | INTRAVENOUS | Status: AC
  Administered 2024-03-24: 500 mg via INTRAVENOUS
  Filled 2024-03-24: qty 5

## 2024-03-24 NOTE — H&P (Signed)
 [facility] H&P   Patient Demographics:    Nicole Clements, is a 83 y.o. female  MRN: [id number]   DOB - [birth year]  Admit Date - 03/24/2024  Outpatient Primary MD for the patient is Glena Landau, MD  Referring MD/NP/PA: PA Rigney  Patient coming from: Home  Chief Complaint  Patient presents with   Weakness      HPI:    Nicole Clements  is a 83 y.o. female, with past medical history of CAD, ischemic cardiomyopathy, chronic systolic CHF, AICD, hypertension, hyperlipidemia, tobacco abuse, still smoking. - Patient presents to ED secondary to weakness, cough and shortness of breath, reports she had a fall, she was unable to get up from the floor until family came, she was hypoxic on presentation, she does not use oxygen at home at baseline, denies nausea, vomiting or diarrhea, per family she has been progressively weak, with significant weight loss. 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    Review of systems:      A full 10 point Review of Systems was done, except as stated above, all other Review of Systems were negative.   With Past History of the following :    Past Medical History:  Diagnosis Date   AICD (automatic cardioverter/defibrillator) present    CAD (coronary artery disease) April 2014   DES to PLA, occluded PDA 07/2014   CHF (congestive heart failure) (HCC)    Hypercholesterolemia    Hypertension    Ischemic cardiomyopathy    LVEF 25%-45%   IVCD (intraventricular conduction defect)    PVC's (premature ventricular contractions)    S/P colonoscopy August 2007    Hyperplastic polyps, rare sigmoid and descending colon diverticulosis, small internal hemorrhoids   STEMI (ST elevation myocardial infarction) (HCC) 07/22/1996      Past Surgical History:  Procedure Laterality Date   ABDOMINAL HYSTERECTOMY     BIV ICD GENERATOR CHANGEOUT N/A 12/22/2022   Procedure: BIV ICD GENERATOR CHANGEOUT;  Surgeon: Tammie Fall, MD;  Location: [facility] INVASIVE CV LAB;  Service: Cardiovascular;  Laterality: N/A;   BREAST CYST EXCISION Left    unsure when   CARDIAC CATHETERIZATION  02/2013   Med Rx   CATARACT EXTRACTION W/PHACO Right 09/15/2017   Procedure: CATARACT EXTRACTION PHACO AND INTRAOCULAR LENS PLACEMENT (IOC);  Surgeon: Albert Huff, MD;  Location: [facility];  Service: Ophthalmology;  Laterality: Right;  CDE: 12.22   CATARACT EXTRACTION W/PHACO Left 09/29/2017   Procedure: CATARACT EXTRACTION PHACO AND INTRAOCULAR LENS PLACEMENT (IOC);  Surgeon: Albert Huff, MD;  Location: [facility];  Service: Ophthalmology;  Laterality: Left;  CDE: 10.02   COLONOSCOPY N/A  12/24/2015   Dr. Fields:moderate sized internal hemorrhoids/moderate diverticulosis, negative microscopic colitis    EP IMPLANTABLE DEVICE  04/18/2015   BV ICD   EP IMPLANTABLE DEVICE N/A 04/18/2015   Procedure: BiV ICD Insertion CRT-D;  Surgeon: Verona Goodwill, MD;  Location: [facility] INVASIVE CV LAB;  Service: Cardiovascular;  Laterality: N/A;   LEFT AND RIGHT HEART CATHETERIZATION WITH CORONARY ANGIOGRAM N/A 07/17/2014   Procedure: LEFT AND RIGHT HEART CATHETERIZATION WITH CORONARY ANGIOGRAM;  Surgeon: Arlander Bellman, MD;  Location: [facility] C[facility];  Service: Cardiovascular;  Laterality: N/A;   PERCUTANEOUS CORONARY STENT INTERVENTION (PCI-S)  07/17/2014   Procedure: PERCUTANEOUS CORONARY STENT INTERVENTION (PCI-S);  Surgeon: Arlander Bellman, MD;  Location: [facility] CATH LAB;  Service: Cardiovascular;;  Distal RCA   S/P Hysterectomy        Social History:     Social History   Tobacco Use   Smoking status: Every  Day    Current packs/day: 0.50    Average packs/day: 0.5 packs/day for 62.1 years (31.0 ttl pk-yrs)    Types: Cigarettes    Start date: 02/26/1962   Smokeless tobacco: Never   Tobacco comments:    down to less than 1/2 ppd  Substance Use Topics   Alcohol use: No    Alcohol/week: 0.0 standard drinks of alcohol       Family History :     Family History  Problem Relation Age of Onset   Hypertension Sister    Diabetes Mellitus II Sister    Hypertension Brother    Diabetes Mellitus II Brother    Hypertension Brother    Diabetes Mellitus II Brother    Hypertension Brother    Diabetes Mellitus II Brother    Colon cancer Neg Hx      Home Medications:   Prior to Admission medications   Medication Sig Start Date End Date Taking? Authorizing Provider  acetaminophen  (TYLENOL ) 325 MG tablet Take by mouth. 07/19/14   [provider]  aspirin  EC 81 MG tablet Take 1 tablet (81 mg total) by mouth daily. 05/15/16   Verona Goodwill, MD  atorvastatin  (LIPITOR) 80 MG tablet Take 1 tablet (80 mg total) by mouth daily. 01/06/24 04/05/24  Laurann Pollock, MD  carvedilol  (COREG ) 25 MG tablet TAKE 1 TABLET BY MOUTH TWICE DAILY 12/30/23   Laurann Pollock, MD  Cholecalciferol  (VITAMIN D3) 2000 UNITS capsule Take 2,000 Units by mouth daily.    [provider]  furosemide  (LASIX ) 20 MG tablet TAKE 1 TABLET BY MOUTH EVERY DAY. MAY TAKE AN ADDITIONAL TABLET EVERY DAY AS NEEDED FOR SWELLING Patient taking differently: Take 20 mg by mouth every other day. TAKE 1 TABLET BY MOUTH EVERY DAY. MAY TAKE AN ADDITIONAL TABLET EVERY DAY AS NEEDED FOR SWELLING. 09/30/23   Laurann Pollock, MD  losartan  (COZAAR ) 25 MG tablet TAKE 1 TABLET(25 MG) BY MOUTH DAILY 03/11/23   Laurann Pollock, MD  magnesium  oxide (MAG-OX) 400 MG tablet Take 1 tablet by mouth twice daily for 5 days, THEN decrease to 1 tablet by mouth once daily 01/06/24   Laurann Pollock, MD  nitroGLYCERIN  (NITROSTAT ) 0.4 MG SL tablet  Place 1 tablet (0.4 mg total) under the tongue every 5 (five) minutes x 3 doses as needed for chest pain. 07/19/14   Abel Hoe, PA-C  Omega-3 Fatty Acids (FISH OIL) 1200 MG CAPS Take 1,200 mg by mouth daily.    [provider]  Pancrelipase , Lip-Prot-Amyl, (ZENPEP ) 40000-126000 units CPEP  Take 2 capsules with meals 3 times a day and 1 capsule with snacks up to twice a day. 02/10/24   Evander Hills, PA-C  potassium chloride  SA (KLOR-CON  M) 20 MEQ tablet TAKE 1 TABLET(20 MEQ) BY MOUTH DAILY 07/07/23   Laurann Pollock, MD  spironolactone  (ALDACTONE ) 25 MG tablet Take 0.5 tablets (12.5 mg total) by mouth daily. 12/22/23   Laurann Pollock, MD     Allergies:     Allergies  Allergen Reactions   Lactose Intolerance (Gi) Other (See Comments)    GI Upset    Sulfa Antibiotics Rash     Physical Exam:   Vitals  Blood pressure 126/65, pulse 69, temperature 97.8 F (36.6 C), resp. rate 18, height 5\' 2"  (1.575 m), weight 55.8 kg, SpO2 91%.   1. General *elderly female, laying in bed, no apparent distress,  2.,  Alert, pleasant, answering questions appropriately  3. No F.N deficits, ALL C.Nerves Intact, Strength 5/5 all 4 extremities, Sensation intact all 4 extremities, Plantars down going.  4. Ears and Eyes appear Normal, Conjunctivae clear, PERRLA. Moist Oral Mucosa.  5. Supple Neck, No JVD, No cervical lymphadenopathy appriciated, No Carotid Bruits.  6. Symmetrical Chest wall movement, good air entry bilaterally, scattered rales and rhonchi  7. RRR, No Gallops, Rubs or Murmurs, No Parasternal Heave.  8. Positive Bowel Sounds, Abdomen Soft, No tenderness, No organomegaly appriciated,No rebound -guarding or rigidity.  9.  No Cyanosis, Normal Skin Turgor, No Skin Rash or Bruise.  10. Good muscle tone,  joints appear normal , no effusions, Normal ROM.    Data Review:    CBC Recent Labs  Lab 03/24/24 1252 03/24/24 1640  WBC 14.9*  --   HGB 12.4  --   HCT 40.7  --    PLT 34* 31*  MCV 95.8  --   MCH 29.2  --   MCHC 30.5  --   RDW 17.7*  --   LYMPHSABS 1.8  --   MONOABS 1.0  --   EOSABS 0.0  --   BASOSABS 0.0  --    ------------------------------------------------------------------------------------------------------------------  Chemistries  Recent Labs  Lab 03/24/24 1252  NA 138  K 3.6  CL 102  CO2 23  GLUCOSE 121*  BUN 37*  CREATININE 1.66*  CALCIUM  9.0  AST 20  ALT 12  ALKPHOS 100  BILITOT 3.6*   ------------------------------------------------------------------------------------------------------------------ estimated creatinine clearance is 21 mL/min (A) (by C-G formula based on SCr of 1.66 mg/dL (H)). ------------------------------------------------------------------------------------------------------------------ No results for input(s): "TSH", "T4TOTAL", "T3FREE", "THYROIDAB" in the last 72 hours.  Invalid input(s): "FREET3"  Coagulation profile Recent Labs  Lab 03/24/24 1640  INR 2.5*   ------------------------------------------------------------------------------------------------------------------- Recent Labs    03/24/24 1252 03/24/24 1640  DDIMER >20.00* >20.00*   -------------------------------------------------------------------------------------------------------------------  Cardiac Enzymes No results for input(s): "CKMB", "TROPONINI", "MYOGLOBIN" in the last 168 hours.  Invalid input(s): "CK" ------------------------------------------------------------------------------------------------------------------    Component Value Date/Time   BNP 2,197.0 (H) 03/24/2024 1252     ---------------------------------------------------------------------------------------------------------------  Urinalysis    Component Value Date/Time   COLORURINE YELLOW 04/29/2015 1746   APPEARANCEUR CLEAR 04/29/2015 1746   LABSPEC <1.005 (L) 04/29/2015 1746   PHURINE 6.0 04/29/2015 1746   GLUCOSEU NEGATIVE 04/29/2015  1746   HGBUR SMALL (A) 04/29/2015 1746   BILIRUBINUR NEGATIVE 04/29/2015 1746   KETONESUR NEGATIVE 04/29/2015 1746   PROTEINUR NEGATIVE 04/29/2015 1746   UROBILINOGEN 0.2 04/29/2015 1746   NITRITE NEGATIVE 04/29/2015 1746   LEUKOCYTESUR NEGATIVE 04/29/2015 1746    ----------------------------------------------------------------------------------------------------------------  Imaging Results:    CT Angio Chest PE W/Cm &/Or Wo Cm Result Date: 03/24/2024 CLINICAL DATA:  Pulmonary embolism suspected EXAM: CT ANGIOGRAPHY CHEST WITH CONTRAST TECHNIQUE: Multidetector CT imaging of the chest was performed using the standard protocol during bolus administration of intravenous contrast. Multiplanar CT image reconstructions and MIPs were obtained to evaluate the vascular anatomy. Multiplanar image (3D post-processing) reconstructions and MIPs were obtained to evaluate the vascular anatomy. RADIATION DOSE REDUCTION: This exam was performed according to the departmental dose-optimization program which includes automated exposure control, adjustment of the mA and/or kV according to patient size and/or use of iterative reconstruction technique. CONTRAST:  75mL OMNIPAQUE  IOHEXOL  350 MG/ML SOLN COMPARISON:  None Available. FINDINGS: Cardiovascular: No evidence of pulmonary embolism to the proximal segmental level. Heart is enlarged. Coronary artery atherosclerotic vascular disease is present in the left coronary territory. A left chest wall implant is present with leads terminating in the heart. Main pulmonary artery is at upper limits of normal for diameter. Mediastinum/Nodes: There is limited evaluation for mediastinal lymphadenopathy secondary to patient motion, beam hardening artifact, and phase of contrast agent acquisition. Although not clearly seen, enlarged nodal tissue is suspected. A possible enlarged lymph node is annotated adjacent to the proximal right mainstem bronchus estimated at 1.2 cm on image 106  of series 8. Lungs/Pleura: The lesion is present in the medial right upper lobe which measures 5.0 x 4.3 cm on image 95 of CT series 8. This is solid and extends medially into the mediastinum without clearly defined margins. Peripheral to this lesion there are areas of airspace consolidation which may be postobstructive. Additionally, the pulmonary arterial structures which course through this lesion are narrowed. Trace right-sided pleural effusion. Upper Abdomen: Degraded by patient motion Musculoskeletal: A sclerotic lesion is present in the right aspect of the sternum on image 76 of CT series 8 measuring 1.2 x 0.8 cm. Review of the MIP images confirms the above findings. IMPRESSION: 1. Motion degraded examination. 2. No evidence of pulmonary embolism to the proximal segmental level. 3. A right upper lobe mass is favored which measures approximately 5.0 cm and abuts the right mediastinum with indistinct margins. Additionally, there is suspected mediastinal lymphadenopathy, and postobstructive changes. 4. A sclerotic lesion is present in the sternum which may represent a focus of bony metastasis. 5. Enlarged heart with coronary artery atherosclerotic vascular disease. Electronically Signed   By: Reagan Camera M.D.   On: 03/24/2024 16:02   CT ABDOMEN PELVIS W CONTRAST Result Date: 03/24/2024 CLINICAL DATA:  Epigastric pain. EXAM: CT ABDOMEN AND PELVIS WITH CONTRAST TECHNIQUE: Multidetector CT imaging of the abdomen and pelvis was performed using the standard protocol following bolus administration of intravenous contrast. RADIATION DOSE REDUCTION: This exam was performed according to the departmental dose-optimization program which includes automated exposure control, adjustment of the mA and/or kV according to patient size and/or use of iterative reconstruction technique. CONTRAST:  75mL OMNIPAQUE  IOHEXOL  350 MG/ML SOLN COMPARISON:  CT abdomen pelvis dated 01/29/2024. FINDINGS: Evaluation of this exam is limited  due to respiratory motion. Lower chest: There is mild eventration of the right hemidiaphragm. There are bibasilar atelectasis. Small right pleural effusion. There is cardiomegaly. No intra-abdominal free air.  Small free fluid in the pelvis. Hepatobiliary: Fatty liver. No biliary dilatation. Small gallstones. Pancreas: Unremarkable. No pancreatic ductal dilatation or surrounding inflammatory changes. Spleen: Normal in size without focal abnormality. Adrenals/Urinary Tract: The adrenal glands unremarkable. Mild bilateral renal parenchyma atrophy. There is no hydronephrosis on either side. The visualized  ureters appear unremarkable. Air within the urinary bladder may have been introduced by recent instrumentation. Stomach/Bowel: There is sigmoid diverticulosis with muscular hypertrophy. No active inflammatory changes. There is no bowel obstruction. The appendix is normal. Vascular/Lymphatic: Advanced aortoiliac atherosclerotic disease. The IVC is unremarkable. No portal venous gas. There is no adenopathy. Reproductive: Hysterectomy.  No suspicious adnexal masses. Other: Diffuse subcutaneous edema and anasarca. Musculoskeletal: Osteopenia with degenerative changes of spine. No acute osseous pathology. IMPRESSION: 1. No acute intra-abdominal or pelvic pathology. 2. Sigmoid diverticulosis. No bowel obstruction. Normal appendix. 3. Fatty liver. 4. Cholelithiasis. 5. Cardiomegaly with small right pleural effusion. 6.  Aortic Atherosclerosis (ICD10-I70.0). Electronically Signed   By: Angus Bark M.D.   On: 03/24/2024 16:01   DG Chest Port 1 View Result Date: 03/24/2024 CLINICAL DATA:  Cough EXAM: PORTABLE CHEST 1 VIEW COMPARISON:  04/19/2015 FINDINGS: Left-sided multi lead pacing device. Cardiomegaly with aortic atherosclerosis. Mild diffuse interstitial opacities with suspected small right pleural effusion and heterogeneous airspace disease at right base. Vague right paratracheal opacity. Probable skin fold  artifact over the right lateral lower chest. IMPRESSION: 1. Cardiomegaly with mild diffuse interstitial opacities which may be due to edema or infection. Suspected small right pleural effusion with heterogeneous airspace disease at right base which may be due to atelectasis or pneumonia. 2. Vague right paratracheal opacity, which may be due to adenopathy, mass or infiltrate. Electronically Signed   By: Esmeralda Hedge M.D.   On: 03/24/2024 15:34   CT Head Wo Contrast Result Date: 03/24/2024 CLINICAL DATA:  Altered mental status, weakness EXAM: CT HEAD WITHOUT CONTRAST CT CERVICAL SPINE WITHOUT CONTRAST TECHNIQUE: Multidetector CT imaging of the head and cervical spine was performed following the standard protocol without intravenous contrast. Multiplanar CT image reconstructions of the cervical spine were also generated. RADIATION DOSE REDUCTION: This exam was performed according to the departmental dose-optimization program which includes automated exposure control, adjustment of the mA and/or kV according to patient size and/or use of iterative reconstruction technique. COMPARISON:  04/30/2015 FINDINGS: CT HEAD FINDINGS Brain: No evidence of acute infarction, hemorrhage, hydrocephalus, extra-axial collection or mass lesion/mass effect. Periventricular white matter hypodensity. Small lacunar infarction of the left caudate (series 2, image 13). Small focus of encephalomalacia of the posterior left frontal lobe (series 2, image 16). Vascular: No hyperdense vessel or unexpected calcification. Skull: Normal. Negative for fracture or focal lesion. Sinuses/Orbits: No acute finding. Other: None. CT CERVICAL SPINE FINDINGS Alignment: Degenerative straightening of the normal cervical lordosis. Skull base and vertebrae: No acute fracture. No primary bone lesion or focal pathologic process. Soft tissues and spinal canal: No prevertebral fluid or swelling. No visible canal hematoma. Disc levels:  Severe multilevel cervical  disc degenerative disease. Upper chest: Negative. Other: None. IMPRESSION: 1. No acute intracranial pathology. 2. 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 Small focus of encephalomalacia of the posterior left frontal lobe. 3. No fracture or static subluxation of the cervical spine. 4. Severe multilevel cervical disc degenerative disease. Electronically Signed   By: Fredricka Jenny M.D.   On: 03/24/2024 14:29   CT Cervical Spine Wo Contrast Result Date: 03/24/2024 CLINICAL DATA:  Altered mental status, weakness EXAM: CT HEAD WITHOUT CONTRAST CT CERVICAL SPINE WITHOUT CONTRAST TECHNIQUE: Multidetector CT imaging of the head and cervical spine was performed following the standard protocol without intravenous contrast. Multiplanar CT image reconstructions of the cervical spine were also generated. RADIATION DOSE REDUCTION: This exam was performed according to the departmental dose-optimization program which  includes automated exposure control, adjustment of the mA and/or kV according to patient size and/or use of iterative reconstruction technique. COMPARISON:  04/30/2015 FINDINGS: CT HEAD FINDINGS Brain: No evidence of acute infarction, hemorrhage, hydrocephalus, extra-axial collection or mass lesion/mass effect. Periventricular white matter hypodensity. Small lacunar infarction of the left caudate (series 2, image 13). Small focus of encephalomalacia of the posterior left frontal lobe (series 2, image 16). Vascular: No hyperdense vessel or unexpected calcification. Skull: Normal. Negative for fracture or focal lesion. Sinuses/Orbits: No acute finding. Other: None. CT CERVICAL SPINE FINDINGS Alignment: Degenerative straightening of the normal cervical lordosis. Skull base and vertebrae: No acute fracture. No primary bone lesion or focal pathologic process. Soft tissues and spinal canal: No prevertebral fluid or swelling. No visible canal hematoma. Disc levels:  Severe  multilevel cervical disc degenerative disease. Upper chest: Negative. Other: None. IMPRESSION: 1. No acute intracranial pathology. 2. 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 Small focus of encephalomalacia of the posterior left frontal lobe. 3. No fracture or static subluxation of the cervical spine. 4. Severe multilevel cervical disc degenerative disease. Electronically Signed   By: Fredricka Jenny M.D.   On: 03/24/2024 14:29   CUP PACEART REMOTE DEVICE CHECK Result Date: 03/24/2024 ICD Scheduled remote reviewed. Normal device function.  Presenting rhythm:  AP/BiV pace BiV pacing 88.2%, HF diagnostics currently abnormal 2 NSVT events, HR's 200-214, 6 & 9 beats in duration Next remote 91 days. 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 This is due to pneumonia - Patient findings highly concerning for malignancy, especially she is still an active smoker. - Sepsis present on admission, lactic acidosis, leukocytosis, tachypnea and hypoxia - Blood cultures, sputum cultures, Legionella antigen and strep pneumonia antigen. - Likely postobstructive pneumonia, she was encouraged to use incentive spirometer 3, flutter valve, continue with IV antibiotics. - At one point she will need biopsy of her mass, once her DIC resolves  Elevated D-dimers thrombocytopenia Low fibrinogen Hyperbilirubinemia DIC -Above constellation of findings concerning for DIC, in the setting of infectious process and malignancy. - Continue supportive care.  Her sepsis. - CTA negative for PE, will obtain venous  Dopplers rule out DVT - Discussed with hematology, will evaluate in a.m. regarding further recommendations  Chronic cardiomyopathy CAD Chronic systolic CHF AICD - Blood pressure has been soft, 92/69 so we will hold cardiac meds and resume tomorrow if stable  Hx of CVA -Resume aspirin  when platelets stabilize -Continue with home statin  Hyperlipidemia - Continue with home statin  DVT Prophylaxis start SCDs if venous Dopplers are negative  AM Labs Ordered, also please review Full Orders  Family Communication: Admission, patients condition and plan of care including tests being ordered have been discussed with the patient and sister who indicate understanding and agree with the plan and Code Status.  Code Status full code  Likely DC to home home  Consults called: Hematology  Admission status: Inpatient  Time spent in minutes : 75 minutes   Seena Dadds M.D on 03/24/2024 at 7:22 PM   [facility] - Office  [phone]

## 2024-03-24 NOTE — ED Notes (Signed)
 Pt transported to CT ?

## 2024-03-24 NOTE — ED Triage Notes (Signed)
 Pt arrived via EMS with complaints of weakness. Pt's sister called 911 due to pt being weak and incontinent. EMS states pt had BM and some stool was in toilet and some on floor. Pt was having difficulty walking and pt states she had fall last night but was able to get up on her on. Pt is a smoker. Cough noted. Pt oriented x4 on arrival to ED.

## 2024-03-24 NOTE — ED Provider Notes (Signed)
 [place] EMERGENCY DEPARTMENT AT [facility] Provider Note   CSN: [id number] Arrival date & time: 03/24/24  1203     History  Chief Complaint  Patient presents with   Weakness    Nicole Clements is a 83 y.o. female.  Patient is an 83 year old female who presents to the emergency department by EMS secondary to weakness and a fall.  Patient notes that she started sprinting increased weakness last night and did have a fall.  She believes that she did strike her head.  She was unable to get out of the floor until she called her family.  Is unclear exactly how long she was in the floor for.  Patient was hypoxic on presentation and does have a cough.  She notes that she does not wear oxygen at home.  She has had no associated nausea, vomiting, diarrhea.  Patient denies any active headache at this time and denies any pain to her neck or back.  She denies any other long bone or joint pain.  She denies any associated dizziness, lightheadedness or syncope.   Weakness      Home Medications Prior to Admission medications   Medication Sig Start Date End Date Taking? Authorizing Provider  acetaminophen  (TYLENOL ) 325 MG tablet Take by mouth. 07/19/14   [provider]  aspirin  EC 81 MG tablet Take 1 tablet (81 mg total) by mouth daily. 05/15/16   Verona Goodwill, MD  atorvastatin  (LIPITOR) 80 MG tablet Take 1 tablet (80 mg total) by mouth daily. 01/06/24 04/05/24  Laurann Pollock, MD  carvedilol  (COREG ) 25 MG tablet TAKE 1 TABLET BY MOUTH TWICE DAILY 12/30/23   Laurann Pollock, MD  Cholecalciferol  (VITAMIN D3) 2000 UNITS capsule Take 2,000 Units by mouth daily.    [provider]  furosemide  (LASIX ) 20 MG tablet TAKE 1 TABLET BY MOUTH EVERY DAY. MAY TAKE AN ADDITIONAL TABLET EVERY DAY AS NEEDED FOR SWELLING Patient taking differently: Take 20 mg by mouth every other day. TAKE 1 TABLET BY MOUTH EVERY DAY. MAY TAKE AN ADDITIONAL TABLET EVERY DAY AS NEEDED FOR SWELLING.  09/30/23   Laurann Pollock, MD  losartan  (COZAAR ) 25 MG tablet TAKE 1 TABLET(25 MG) BY MOUTH DAILY 03/11/23   Laurann Pollock, MD  magnesium  oxide (MAG-OX) 400 MG tablet Take 1 tablet by mouth twice daily for 5 days, THEN decrease to 1 tablet by mouth once daily 01/06/24   Laurann Pollock, MD  nitroGLYCERIN  (NITROSTAT ) 0.4 MG SL tablet Place 1 tablet (0.4 mg total) under the tongue every 5 (five) minutes x 3 doses as needed for chest pain. 07/19/14   Abel Hoe, PA-C  Omega-3 Fatty Acids (FISH OIL) 1200 MG CAPS Take 1,200 mg by mouth daily.    [provider]  Pancrelipase , Lip-Prot-Amyl, (ZENPEP ) 40000-126000 units CPEP Take 2 capsules with meals 3 times a day and 1 capsule with snacks up to twice a day. 02/10/24   Harper, Kristen S, PA-C  potassium chloride  SA (KLOR-CON  M) 20 MEQ tablet TAKE 1 TABLET(20 MEQ) BY MOUTH DAILY 07/07/23   Laurann Pollock, MD  spironolactone  (ALDACTONE ) 25 MG tablet Take 0.5 tablets (12.5 mg total) by mouth daily. 12/22/23   Laurann Pollock, MD      Allergies    Lactose intolerance (gi) and Sulfa antibiotics    Review of Systems   Review of Systems  Neurological:  Positive for weakness.  All other systems reviewed and are negative.  Physical Exam Updated Vital Signs BP 120/80   Pulse 67   Temp 97.7 F (36.5 C) (Oral)   Resp (!) 27   Ht 5\' 2"  (1.575 m)   Wt 55.8 kg   SpO2 93%   BMI 22.50 kg/m  Physical Exam Vitals and nursing note reviewed.  Constitutional:      Appearance: Normal appearance.  HENT:     Head: Normocephalic and atraumatic.     Nose: Nose normal. No congestion or rhinorrhea.     Mouth/Throat:     Mouth: Mucous membranes are moist.     Pharynx: No oropharyngeal exudate or posterior oropharyngeal erythema.  Eyes:     Extraocular Movements: Extraocular movements intact.     Conjunctiva/sclera: Conjunctivae normal.     Pupils: Pupils are equal, round, and reactive to light.  Cardiovascular:     Rate and Rhythm:  Normal rate and regular rhythm.     Pulses: Normal pulses.     Heart sounds: Normal heart sounds. No murmur heard.    No gallop.  Pulmonary:     Effort: Pulmonary effort is normal. No respiratory distress.     Breath sounds: No stridor. Rales present. No wheezing or rhonchi.  Abdominal:     General: Abdomen is flat. Bowel sounds are normal. There is no distension.     Palpations: Abdomen is soft.     Tenderness: There is no abdominal tenderness. There is no guarding.  Musculoskeletal:        General: No swelling, tenderness, deformity or signs of injury. Normal range of motion.     Cervical back: Normal range of motion and neck supple. No rigidity or tenderness.     Right lower leg: No edema.     Left lower leg: No edema.  Skin:    General: Skin is warm and dry.     Findings: No bruising or rash.  Neurological:     General: No focal deficit present.     Mental Status: She is alert and oriented to person, place, and time. Mental status is at baseline.     Cranial Nerves: No cranial nerve deficit.     Sensory: No sensory deficit.     Motor: No weakness.     Coordination: Coordination normal.  Psychiatric:        Mood and Affect: Mood normal.        Behavior: Behavior normal.        Thought Content: Thought content normal.        Judgment: Judgment normal.     ED Results / Procedures / Treatments   Labs (all labs ordered are listed, but only abnormal results are displayed) Labs Reviewed  RESP PANEL BY RT-PCR (RSV, FLU A&B, COVID)  [id number]  CULTURE, BLOOD (ROUTINE X 2)  CULTURE, BLOOD (ROUTINE X 2)  CBC WITH DIFFERENTIAL/PLATELET  COMPREHENSIVE METABOLIC PANEL WITH GFR  LIPASE, BLOOD  URINALYSIS, ROUTINE W REFLEX MICROSCOPIC  LACTIC ACID, PLASMA  LACTIC ACID, PLASMA  D-DIMER, QUANTITATIVE  CK  BRAIN NATRIURETIC PEPTIDE    EKG None  Radiology CUP PACEART REMOTE DEVICE CHECK Result Date: 03/24/2024 ICD Scheduled remote reviewed. Normal device function.  Presenting  rhythm:  AP/BiV pace BiV pacing 88.2%, HF diagnostics currently abnormal 2 NSVT events, HR's 200-214, 6 & 9 beats in duration Next remote 91 days. LA, CVRS   Procedures .Critical Care  Performed by: Roselynn Connors, PA-C Authorized by: Roselynn Connors, PA-C   Critical care provider statement:  Critical care time (minutes):  35   Critical care was necessary to treat or prevent imminent or life-threatening deterioration of the following conditions: DIC.   Critical care was time spent personally by me on the following activities:  Development of treatment plan with patient or surrogate, discussions with consultants, evaluation of patient's response to treatment, examination of patient, ordering and review of laboratory studies, ordering and review of radiographic studies, ordering and performing treatments and interventions, pulse oximetry, re-evaluation of patient's condition and review of old charts   I assumed direction of critical care for this patient from another provider in my specialty: no     Care discussed with: admitting provider       Medications Ordered in ED Medications  lactated ringers  bolus 500 mL (has no administration in time range)    ED Course/ Medical Decision Making/ A&P                                 Medical Decision Making Amount and/or Complexity of Data Reviewed Labs: ordered. Radiology: ordered.  Risk Prescription drug management. Decision regarding hospitalization.   This patient presents to the ED for concern of fall, generalized weakness, cough, this involves an extensive number of treatment options, and is a complaint that carries with it a high risk of complications and morbidity.  The differential diagnosis includes pneumonia, pneumothorax, hemothorax, ACS, pulmonary embolus, pleural effusion, CHF, electrolyte derangement, acute kidney injury   Co morbidities that complicate the patient evaluation  CHF   Additional history  obtained:  Additional history obtained from medical records External records from outside source obtained and reviewed including medical records   Lab Tests:  I Ordered, and personally interpreted labs.  The pertinent results include: Leukocytosis, no anemia, thrombocytopenia, elevated lactic acid, elevated D-dimer, elevated BNP, elevated INR and PTT, elevated creatinine, elevated bilirubin, normal electrolytes   Imaging Studies ordered:  I ordered imaging studies including CT scan of head, cervical spine, chest, abdomen and pelvis I independently visualized and interpreted imaging which showed no acute intracranial process, no cervical spine fracture, right upper lobe mass with lytic changes along the sternum, no acute intra-abdominal surgical process, no pulmonary embolus I agree with the radiologist interpretation   Cardiac Monitoring: / EKG:  The patient was maintained on a cardiac monitor.  I personally viewed and interpreted the cardiac monitored which showed an underlying rhythm of: Paced rhythm, no ST/T wave changes, no ischemic changes, no STEMI   Consultations Obtained:  I requested consultation with the hospitalist, hematology,  and discussed lab and imaging findings as well as pertinent plan - they recommend: Admission   Problem List / ED Course / Critical interventions / Medication management  Patient does remain stable at this time.  Discussed with patient that CT scan of the chest is concerning for a new mass in the right lung as well as possible metastatic changes to the sternum.  Patient has been given dose of Lasix  as well given her elevated BNP and possible pulmonary edema.  Did start antibiotics given her leukocytosis and possible pneumonia.  Patient does have lab changes concerning for DIC and we did consult with oncology who has reviewed images and lab work.  DIC panel has been added at this point and does demonstrate elevated PTT, INR.  Have discussed patient case  with Dr. Osborne Blazer with the hospitalist service who has excepted for admission at this time.  Patient has no  clinical signs of external bleeding at this point and CT scans demonstrated no signs of any acute intrathoracic or intra-abdominal bleeding.  CT scan of the head demonstrated no acute intracranial hemorrhage.  She has no other long bone or joint pain noted on physical exam. I ordered medication including Rocephin, azithromycin, Lasix  for CHF, pneumonia Reevaluation of the patient after these medicines showed that the patient improved I have reviewed the patients home medicines and have made adjustments as needed   Social Determinants of Health:  None   Test / Admission - Considered:  Admission        Final Clinical Impression(s) / ED Diagnoses Final diagnoses:  None    Rx / DC Orders ED Discharge Orders     None         Roselynn Connors, PA-C 03/24/24 1741    Teddi Favors, DO 03/31/24 1531

## 2024-03-24 NOTE — Progress Notes (Signed)
   03/24/24 2256  TOC Brief Assessment  Insurance and Status Reviewed  Patient has primary care physician Yes  Home environment has been reviewed From home  Prior level of function: Independent  Prior/Current Home Services No current home services  Social Drivers of Health Review SDOH reviewed interventions complete (smoking cessation added to AVS)  Readmission risk has been reviewed Yes  Transition of care needs no transition of care needs at this time   Transition of Care Department [facility]) has reviewed patient and will continue to monitor. PT/OT evals pending. May need home oxygen.

## 2024-03-25 ENCOUNTER — Inpatient Hospital Stay (HOSPITAL_COMMUNITY)

## 2024-03-25 DIAGNOSIS — A419 Sepsis, unspecified organism: Secondary | ICD-10-CM

## 2024-03-25 DIAGNOSIS — R6520 Severe sepsis without septic shock: Secondary | ICD-10-CM

## 2024-03-25 DIAGNOSIS — R0609 Other forms of dyspnea: Secondary | ICD-10-CM

## 2024-03-25 DIAGNOSIS — R918 Other nonspecific abnormal finding of lung field: Secondary | ICD-10-CM | POA: Diagnosis not present

## 2024-03-25 DIAGNOSIS — D65 Disseminated intravascular coagulation [defibrination syndrome]: Secondary | ICD-10-CM | POA: Diagnosis not present

## 2024-03-25 LAB — CBC WITH DIFFERENTIAL/PLATELET
Abs Immature Granulocytes: 0.16 10*3/uL — ABNORMAL HIGH (ref 0.00–0.07)
Basophils Absolute: 0 10*3/uL (ref 0.0–0.1)
Basophils Relative: 0 %
Eosinophils Absolute: 0 10*3/uL (ref 0.0–0.5)
Eosinophils Relative: 0 %
HCT: 37.2 % (ref 36.0–46.0)
Hemoglobin: 11.2 g/dL — ABNORMAL LOW (ref 12.0–15.0)
Immature Granulocytes: 1 %
Lymphocytes Relative: 9 %
Lymphs Abs: 1.6 10*3/uL (ref 0.7–4.0)
MCH: 29.6 pg (ref 26.0–34.0)
MCHC: 30.1 g/dL (ref 30.0–36.0)
MCV: 98.2 fL (ref 80.0–100.0)
Monocytes Absolute: 1.1 10*3/uL — ABNORMAL HIGH (ref 0.1–1.0)
Monocytes Relative: 6 %
Neutro Abs: 13.8 10*3/uL — ABNORMAL HIGH (ref 1.7–7.7)
Neutrophils Relative %: 84 %
Platelets: 34 10*3/uL — ABNORMAL LOW (ref 150–400)
RBC: 3.79 MIL/uL — ABNORMAL LOW (ref 3.87–5.11)
RDW: 17.4 % — ABNORMAL HIGH (ref 11.5–15.5)
WBC: 16.6 10*3/uL — ABNORMAL HIGH (ref 4.0–10.5)
nRBC: 0 % (ref 0.0–0.2)

## 2024-03-25 LAB — ECHOCARDIOGRAM COMPLETE
AR max vel: 2.02 cm2
AV Area VTI: 2 cm2
AV Area mean vel: 2.12 cm2
AV Mean grad: 1.5 mmHg
AV Peak grad: 3.9 mmHg
Ao pk vel: 0.99 m/s
Area-P 1/2: 2.82 cm2
Height: 62 in
MV M vel: 5.38 m/s
MV Peak grad: 115.8 mmHg
Radius: 0.4 cm
S' Lateral: 6.2 cm
Single Plane A4C EF: 27 %
Weight: 1968 [oz_av]

## 2024-03-25 LAB — BASIC METABOLIC PANEL WITH GFR
Anion gap: 8 (ref 5–15)
BUN: 36 mg/dL — ABNORMAL HIGH (ref 8–23)
CO2: 25 mmol/L (ref 22–32)
Calcium: 8.3 mg/dL — ABNORMAL LOW (ref 8.9–10.3)
Chloride: 107 mmol/L (ref 98–111)
Creatinine, Ser: 1.57 mg/dL — ABNORMAL HIGH (ref 0.44–1.00)
GFR, Estimated: 33 mL/min — ABNORMAL LOW (ref >60)
Glucose, Bld: 90 mg/dL (ref 70–99)
Potassium: 3.3 mmol/L — ABNORMAL LOW (ref 3.5–5.1)
Sodium: 140 mmol/L (ref 135–145)

## 2024-03-25 LAB — PROTIME-INR
INR: 2.2 — ABNORMAL HIGH (ref 0.8–1.2)
Prothrombin Time: 25 s — ABNORMAL HIGH (ref 11.4–15.2)

## 2024-03-25 LAB — MAGNESIUM: Magnesium: 2.2 mg/dL (ref 1.7–2.4)

## 2024-03-25 LAB — HEPATIC FUNCTION PANEL
ALT: 10 U/L (ref 0–44)
AST: 15 U/L (ref 15–41)
Albumin: 2.8 g/dL — ABNORMAL LOW (ref 3.5–5.0)
Alkaline Phosphatase: 84 U/L (ref 38–126)
Bilirubin, Direct: 0.7 mg/dL — ABNORMAL HIGH (ref 0.0–0.2)
Indirect Bilirubin: 1.6 mg/dL — ABNORMAL HIGH (ref 0.3–0.9)
Total Bilirubin: 2.3 mg/dL — ABNORMAL HIGH (ref 0.0–1.2)
Total Protein: 5.7 g/dL — ABNORMAL LOW (ref 6.5–8.1)

## 2024-03-25 LAB — ABO/RH: ABO/RH(D): O POS

## 2024-03-25 LAB — TYPE AND SCREEN
ABO/RH(D): O POS
Antibody Screen: NEGATIVE

## 2024-03-25 LAB — STREP PNEUMONIAE URINARY ANTIGEN: Strep Pneumo Urinary Antigen: NEGATIVE

## 2024-03-25 MED ORDER — SODIUM CHLORIDE 0.9% IV SOLUTION: Freq: Once | INTRAVENOUS | Status: AC

## 2024-03-25 MED ORDER — POTASSIUM CHLORIDE CRYS ER 20 MEQ PO TBCR
40.0000 meq | EXTENDED_RELEASE_TABLET | Freq: Once | ORAL | Status: AC
  Administered 2024-03-25: 40 meq via ORAL
  Filled 2024-03-25: qty 2

## 2024-03-25 MED ORDER — SODIUM CHLORIDE 0.9 % IV SOLN: 500.0000 mg | INTRAVENOUS | Status: DC

## 2024-03-25 MED ORDER — SODIUM CHLORIDE 0.9 % IV SOLN: 2.0000 g | INTRAVENOUS | Status: DC

## 2024-03-25 MED ORDER — IPRATROPIUM-ALBUTEROL 0.5-2.5 (3) MG/3ML IN SOLN
3.0000 mL | Freq: Four times a day (QID) | RESPIRATORY_TRACT | Status: DC
  Administered 2024-03-25 – 2024-03-26 (×4): 3 mL via RESPIRATORY_TRACT
  Filled 2024-03-25 (×5): qty 3

## 2024-03-25 NOTE — Plan of Care (Signed)
  Problem: Acute Rehab OT Goals (only OT should resolve) Goal: Pt. Will Perform Grooming Flowsheets (Taken 03/25/2024 1538) Pt Will Perform Grooming:  Independently  standing Goal: Pt. Will Transfer To Toilet Flowsheets (Taken 03/25/2024 1538) Pt Will Transfer to Toilet: Independently Goal: OT Additional ADL Goal #1 Flowsheets (Taken 03/25/2024 1538) Additional ADL Goal #1: Pt will complete written HEP to improve strength in BL UE.   Azell Boll, OTR/L

## 2024-03-25 NOTE — Evaluation (Signed)
 Occupational Therapy Evaluation Patient Details Name: Nicole Clements MRN: [id number] DOB: [birth year] Today's Date: 03/25/2024   History of Present Illness   Nicole Clements  is a 83 y.o. female, with past medical history of CAD, ischemic cardiomyopathy, chronic systolic CHF, AICD, hypertension, hyperlipidemia, tobacco abuse, still smoking.  - Patient presents to ED secondary to weakness, cough and shortness of breath, reports she had a fall, she was unable to get up from the floor until family came, she was hypoxic on presentation, she does not use oxygen at home at baseline, denies nausea, vomiting or diarrhea, per family she has been progressively weak, with significant weight loss.  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     Clinical Impressions Pt agreeable to OT PT evaluation. She reports that she lives alone but has a sister nearby to assist if needed. Pt completed bed mobility with increased time and CGA. She completed sit to stand t/f with CGA and use of RW. Pt able to ambulate into hallway with RW and CGA- trialed removing O2 for functional activity, SPO2 dropping to 88% on 1L. Pt would benefit from OT while in acute care setting.      If plan is discharge home, recommend the following:   A little help with walking and/or transfers;A little help with bathing/dressing/bathroom;Help with stairs or ramp for entrance;Assistance with cooking/housework     Functional Status Assessment   Patient has had a recent decline in their functional status and demonstrates the ability to make significant improvements in function in a reasonable and predictable amount of time.     Equipment  Recommendations   None recommended by OT     Recommendations for Other Services         Precautions/Restrictions   Precautions Precautions: Fall Recall of Precautions/Restrictions: Intact Restrictions Weight Bearing Restrictions Per Provider Order: No     Mobility Bed Mobility Overal bed mobility: Needs Assistance Bed Mobility: Supine to Sit     Supine to sit: Contact guard     General bed mobility comments: Slow labored movement. CGA for safety. HOB flat. use of railings.    Transfers Overall transfer level: Needs assistance Equipment used: Rolling walker (2 wheels), None Transfers: Sit to/from Stand, Bed to chair/wheelchair/BSC Sit to Stand: Min assist     Step pivot transfers: Contact guard assist, Min assist     General transfer comment: Min A during STS w/o RW due to imbalance. Min/CGA during transfer for safety and mild imbalance/slow labored movement      Balance Overall balance assessment: Needs assistance Sitting-balance support: Feet supported, No upper extremity supported Sitting balance-Leahy Scale: Fair Sitting balance - Comments: Fair/good seated EOB   Standing balance support: During functional activity, Reliant on assistive device for balance, Bilateral upper extremity supported Standing balance-Leahy Scale: Fair Standing balance comment: Fair w/ RW                           ADL either performed or assessed with clinical judgement   ADL Overall ADL's : Needs assistance/impaired Eating/Feeding: Set up;Sitting   Grooming: Set up;Sitting   Upper Body Bathing: Set up;Sitting   Lower Body Bathing: Set up;Sit to/from stand  Upper Body Dressing : Set up;Sitting   Lower Body Dressing: Set up;Sit to/from stand   Toilet Transfer: Contact guard assist;Rolling walker (2 wheels)   Toileting- Clothing Manipulation and Hygiene: Minimal assistance;Sit to/from stand   Tub/ Shower Transfer: Contact guard assist;Rolling walker (2  wheels)   Functional mobility during ADLs: Contact guard assist;Rolling walker (2 wheels)       Vision Baseline Vision/History: 0 No visual deficits Ability to See in Adequate Light: 0 Adequate Patient Visual Report: No change from baseline Vision Assessment?: No apparent visual deficits            Pertinent Vitals/Pain Pain Assessment Pain Assessment: No/denies pain     Extremity/Trunk Assessment Upper Extremity Assessment Upper Extremity Assessment: Overall WFL for tasks assessed   Lower Extremity Assessment Lower Extremity Assessment: Defer to PT evaluation   Cervical / Trunk Assessment Cervical / Trunk Assessment: Kyphotic   Communication Communication Communication: No apparent difficulties   Cognition Arousal: Alert, Lethargic Behavior During Therapy: WFL for tasks assessed/performed Cognition: No apparent impairments                               Following commands: Intact       Cueing  General Comments   Cueing Techniques: Verbal cues              Home Living Family/patient expects to be discharged to:: Private residence Living Arrangements: Alone Available Help at Discharge: Family;Friend(s);Available PRN/intermittently Type of Home: House Home Access: Stairs to enter [employer] of Steps: 2-3 Entrance Stairs-Rails: Can reach both;Left;Right Home Layout: One level     Bathroom Shower/Tub: Tub/shower unit;Walk-in shower   Bathroom Toilet: Standard Bathroom Accessibility: Yes How Accessible: Accessible via walker Home Equipment: None          Prior Functioning/Environment Prior Level of Function : Independent/Modified Independent             Mobility Comments: Household ambulator with no AD ADLs Comments: Independent with ADL/iADL    OT Problem List: Decreased strength;Decreased activity tolerance;Decreased safety awareness   OT Treatment/Interventions: Self-care/ADL training;Therapeutic exercise;DME  and/or AE instruction;Therapeutic activities;Patient/family education;Energy conservation      OT Goals(Current goals can be found in the care plan section)   Acute Rehab OT Goals Patient Stated Goal: to go home OT Goal Formulation: With patient Time For Goal Achievement: 04/08/24 Potential to Achieve Goals: Good   OT Frequency:  Min 2X/week    Co-evaluation PT/OT/SLP Co-Evaluation/Treatment: Yes Reason for Co-Treatment: To address functional/ADL transfers PT goals addressed during session: Mobility/safety with mobility OT goals addressed during session: ADL's and self-care      AM-PAC OT "6 Clicks" Daily Activity     Outcome Measure Help from another person eating meals?: None Help from another person taking care of personal grooming?: A Little Help from another person toileting, which includes using toliet, bedpan, or urinal?: A Little Help from another person bathing (including washing, rinsing, drying)?: A Little Help from another person to put on and taking off regular upper body clothing?: None Help from another person to put on and taking off regular lower body clothing?: A Little 6 Click Score: 20   End of Session Equipment Utilized During Treatment: Rolling walker (2 wheels);Oxygen  Activity Tolerance: Patient tolerated treatment well Patient left: Other (comment) (Pt left working with PT)  OT Visit Diagnosis: Muscle weakness (generalized) (M62.81);Unsteadiness on feet (R26.81)  Time: 4540-9811 OT Time Calculation (min): 18 min Charges:  OT General Charges $OT Visit: 1 Visit OT Evaluation $OT Eval Low Complexity: 1 Low    Rawleigh Cadet, OTR/L 03/25/2024, 2:22 PM

## 2024-03-25 NOTE — Progress Notes (Signed)
 OT Cancellation Note  Patient Details Name: Nicole Clements MRN: [id number] DOB: [birth year]   Cancelled Treatment:    Reason Eval/Treat Not Completed: Patient at procedure or test/ unavailable (Pt getting EKG, will attempt to see pt at a later time.)  Rawleigh Cadet 03/25/2024, 9:32 AM

## 2024-03-25 NOTE — TOC Initial Note (Signed)
 Transition of Care [facility]) - Initial/Assessment Note    Patient Details  Name: Nicole Clements MRN: [id number] Date of Birth: 01-22-1941  Transition of Care [facility]) CM/SW Contact:    Ander Katos, LCSW Phone Number: 03/25/2024, 1:27 PM  Clinical Narrative: Pt admitted due to pneumonia. Pt reports she lives alone and is typically fairly independent with ADLs. PT evaluated pt and recommend SNF. LCSW discussed placement process, including Medicare coverage/criteria. Medicare.gov ratings reviewed. Pt requests [facility]. Will initiate bed search.                   Expected Discharge Plan: Skilled Nursing Facility Barriers to Discharge: Continued Medical Work up   Patient Goals and CMS Choice Patient states their goals for this hospitalization and ongoing recovery are:: short term SNF   Choice offered to / list presented to : Patient [place] ownership interest in [facility].provided to:: Patient    Expected Discharge Plan and Services In-house Referral: Clinical Social Work   Post Acute Care Choice: Skilled Nursing Facility Living arrangements for the past 2 months: Single Family Home                                      Prior Living Arrangements/Services Living arrangements for the past 2 months: Single Family Home Lives with:: Self Patient language and need for interpreter reviewed:: Yes Do you feel safe going back to the place where you live?: Yes      Need for Family Participation in Patient Care: No (Comment)     Criminal Activity/Legal Involvement Pertinent to Current Situation/Hospitalization: No - Comment as needed  Activities of Daily Living   ADL Screening (condition at time of admission) Independently performs ADLs?: Yes (appropriate for developmental age) Is the patient deaf or have difficulty hearing?: No Does the patient have difficulty seeing, even when wearing glasses/contacts?: No Does the patient have difficulty  concentrating, remembering, or making decisions?: No  Permission Sought/Granted                  Emotional Assessment     Affect (typically observed): Appropriate Orientation: : Oriented to Self, Oriented to Place, Oriented to  Time, Oriented to Situation Alcohol / Substance Use: Not Applicable Psych Involvement: No (comment)  Admission diagnosis:  DIC (disseminated intravascular coagulation) (HCC) [D65] Pneumonia [J18.9] Thrombocytopenia (HCC) [D69.6] Acute respiratory failure with hypoxia (HCC) [J96.01] Elevated d-dimer [R79.89] Mass of right lung [R91.8] Acute congestive heart failure, unspecified heart failure type (HCC) [I50.9] Patient Active Problem List   Diagnosis Date Noted   Pneumonia 03/24/2024   ICD (implantable cardioverter-defibrillator) in place 01/24/2020   Diarrhea 12/11/2015   Stroke (HCC) 04/30/2015   Expressive aphasia 04/30/2015   Ventricular tachycardia, non-sustained (HCC) 04/29/2015   Chronic systolic CHF (congestive heart failure), NYHA class 3 (HCC) 04/18/2015   S/P coronary artery stent placement DES-PLA of RCA and occlusion of PDA 07/17/14 07/18/2014   Acute respiratory failure (HCC) 07/12/2014   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   ICM- new LVD 20-25% 07/13/14 02/21/2013   Acute respiratory failure with hypoxia- Bi Pap per EMS 12/31/2012   Acute systolic congestive heart failure (HCC) 12/31/2012   Hx of colonoscopy with polypectomy 07/17/2011   DYSLIPIDEMIA 01/11/2010   TOBACCO USER 01/11/2010   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   CAD- remote MI '90s, distal RCA DES 07/17/14 01/11/2010   PCP:  Glena Landau, MD  Pharmacy:   [facility] [phone] - [place],  - [address] AT [facility] OF [address] & [address] [place] [address] Phone: [phone] Fax: [phone]     Social Drivers of Health (SDOH) Social History: SDOH Screenings   Food Insecurity: No Food Insecurity (03/24/2024)  Housing: Low Risk   (03/24/2024)  Transportation Needs: No Transportation Needs (03/24/2024)  Utilities: Not At Risk (03/24/2024)  Social Connections: Socially Isolated (03/24/2024)  Tobacco Use: High Risk (01/27/2024)   SDOH Interventions:     Readmission Risk Interventions    03/24/2024   10:56 PM  Readmission Risk Prevention Plan  Post Dischage Appt Complete  Medication Screening Complete  Transportation Screening Complete

## 2024-03-25 NOTE — Plan of Care (Signed)
  Problem: Acute Rehab PT Goals(only PT should resolve) Goal: Pt Will Go Supine/Side To Sit Outcome: Progressing Flowsheets (Taken 03/25/2024 1227) Pt will go Supine/Side to Sit: with modified independence Goal: Patient Will Transfer Sit To/From Stand Outcome: Progressing Flowsheets (Taken 03/25/2024 1227) Patient will transfer sit to/from stand: with supervision Goal: Pt Will Transfer Bed To Chair/Chair To Bed Outcome: Progressing Flowsheets (Taken 03/25/2024 1227) Pt will Transfer Bed to Chair/Chair to Bed: with supervision Goal: Pt Will Ambulate Outcome: Progressing Flowsheets (Taken 03/25/2024 1227) Pt will Ambulate:  50 feet  with supervision  with rolling walker  Goal: Pt Will Go Up/Down Stairs Flowsheets (Taken 03/25/2024 1227) Pt will Go Up / Down Stairs:  1-2 stairs  with contact guard assist  with rail(s)   12:29 PM, 03/25/24 Marysue Sola, PT, DPT [place] [facility]

## 2024-03-25 NOTE — Progress Notes (Addendum)
 [facility] Consultation Oncology  Name: Nicole Clements      MRN: [id number]    Location: A307/A307-01  Date: 03/25/2024 Time:9:37 AM   REFERRING PHYSICIAN:  Dr.Elgergawy  REASON FOR CONSULT:  Thrombocytopenia, new lung mass   HISTORY OF PRESENT ILLNESS:   Patient is an 83 year old female with past medical history of CAD, ischemic cardiomyopathy, chronic systolic heart failure, hyperlipidemia, tobacco abuse presented to the ED with cough and shortness of breath.  She had a CT scan done at this time for fall revealing a right upper lobe lung mass measuring 5 into 4.3 cm along with some lymphadenopathy.  She also had thrombocytopenia with a platelets of 34.  Patient was seen in her room today, she was accompanied by her sister.  Patient reported that she is feeling better today and just wants to sleep.  She stated that she lives all by herself, is very functional in her house, cooks and cleans.  She is able to shower and dress up herself.  She spends most of her time on her legs.  Overall, patient has a good functional status When discussed about goals of care and how to proceed with the lung mass management, patient did not want to discuss further and stated that she just wanted to sleep.  Of note, patient is a chronic smoker and is currently smoking.  Does not use home oxygen.  PAST MEDICAL HISTORY:   Past Medical History:  Diagnosis Date   AICD (automatic cardioverter/defibrillator) present    CAD (coronary artery disease) April 2014   DES to PLA, occluded PDA 07/2014   CHF (congestive heart failure) (HCC)    Hypercholesterolemia    Hypertension    Ischemic cardiomyopathy    LVEF 25%-45%   IVCD (intraventricular conduction defect)    PVC's (premature ventricular contractions)    S/P colonoscopy August 2007   Hyperplastic polyps, rare sigmoid and descending colon diverticulosis, small internal hemorrhoids   STEMI (ST elevation myocardial infarction) (HCC) 07/22/1996     ALLERGIES: Allergies  Allergen Reactions   Lactose Intolerance (Gi) Other (See Comments)    GI Upset    Sulfa Antibiotics Rash      MEDICATIONS: I have reviewed the patient's current medications.     PAST SURGICAL HISTORY Past Surgical History:  Procedure Laterality Date   ABDOMINAL HYSTERECTOMY     BIV ICD GENERATOR CHANGEOUT N/A 12/22/2022   Procedure: BIV ICD GENERATOR CHANGEOUT;  Surgeon: Tammie Fall, MD;  Location: [facility] INVASIVE CV LAB;  Service: Cardiovascular;  Laterality: N/A;   BREAST CYST EXCISION Left    unsure when   CARDIAC CATHETERIZATION  02/2013   Med Rx   CATARACT EXTRACTION W/PHACO Right 09/15/2017   Procedure: CATARACT EXTRACTION PHACO AND INTRAOCULAR LENS PLACEMENT (IOC);  Surgeon: Albert Huff, MD;  Location: [facility] ORS;  Service: Ophthalmology;  Laterality: Right;  CDE: 12.22   CATARACT EXTRACTION W/PHACO Left 09/29/2017   Procedure: CATARACT EXTRACTION PHACO AND INTRAOCULAR LENS PLACEMENT (IOC);  Surgeon: Albert Huff, MD;  Location: [facility] ORS;  Service: Ophthalmology;  Laterality: Left;  CDE: 10.02   COLONOSCOPY N/A 12/24/2015   Dr. Fields:moderate sized internal hemorrhoids/moderate diverticulosis, negative microscopic colitis    EP IMPLANTABLE DEVICE  04/18/2015   BV ICD   EP IMPLANTABLE DEVICE N/A 04/18/2015   Procedure: BiV ICD Insertion CRT-D;  Surgeon: Verona Goodwill, MD;  Location: [facility] INVASIVE CV LAB;  Service: Cardiovascular;  Laterality: N/A;   LEFT AND RIGHT HEART CATHETERIZATION WITH CORONARY ANGIOGRAM N/A  07/17/2014   Procedure: LEFT AND RIGHT HEART CATHETERIZATION WITH CORONARY ANGIOGRAM;  Surgeon: Arlander Bellman, MD;  Location: [facility] CATH LAB;  Service: Cardiovascular;  Laterality: N/A;   PERCUTANEOUS CORONARY STENT INTERVENTION (PCI-S)  07/17/2014   Procedure: PERCUTANEOUS CORONARY STENT INTERVENTION (PCI-S);  Surgeon: Arlander Bellman, MD;  Location: [facility] CATH LAB;  Service: Cardiovascular;;  Distal RCA   S/P Hysterectomy      FAMILY  HISTORY: Family History  Problem Relation Age of Onset   Hypertension Sister    Diabetes Mellitus II Sister    Hypertension Brother    Diabetes Mellitus II Brother    Hypertension Brother    Diabetes Mellitus II Brother    Hypertension Brother    Diabetes Mellitus II Brother    Colon cancer Neg Hx     SOCIAL HISTORY:  reports that she has been smoking cigarettes. She started smoking about 62 years ago. She has a 31 pack-year smoking history. She has never used smokeless tobacco. She reports that she does not drink alcohol and does not use drugs.  PERFORMANCE STATUS: The patient's performance status is 2 - Symptomatic, <50% confined to bed  PHYSICAL EXAM: Most Recent Vital Signs: Blood pressure 121/60, pulse 72, temperature 97.7 F (36.5 C), temperature source Oral, resp. rate 14, height 5\' 2"  (1.575 m), weight 123 lb (55.8 kg), SpO2 94%.  GENERAL:alert, no distress and comfortable SKIN: skin color, texture, turgor are normal, no rashes or significant lesions EYES: normal, conjunctiva are pink and non-injected, sclera clear OROPHARYNX:no exudate, no erythema and lips, buccal mucosa, and tongue normal  NECK: supple, thyroid  normal size, non-tender, without nodularity LYMPH:  no palpable lymphadenopathy in the cervical, axillary or inguinal LUNGS: clear to auscultation and percussion with normal breathing effort HEART: regular rate & rhythm and no murmurs and no lower extremity edema ABDOMEN:abdomen soft, non-tender and normal bowel sounds Musculoskeletal:no cyanosis of digits and no clubbing  PSYCH: alert & oriented x 3 with fluent speech   LABORATORY DATA:  Results for orders placed or performed during the hospital encounter of 03/24/24 (from the past 48 hours)  CBC with Differential     Status: Abnormal   Collection Time: 03/24/24 12:52 PM  Result Value Ref Range   WBC 14.9 (H) 4.0 - 10.5 K/uL   RBC 4.25 3.87 - 5.11 MIL/uL   Hemoglobin 12.4 12.0 - 15.0 g/dL   HCT 95.6  21.3 - 08.6 %   MCV 95.8 80.0 - 100.0 fL   MCH 29.2 26.0 - 34.0 pg   MCHC 30.5 30.0 - 36.0 g/dL   RDW 57.8 (H) 46.9 - 62.9 %   Platelets 34 (L) 150 - 400 K/uL    Comment: Immature Platelet Fraction may be clinically indicated, consider ordering this additional test [id number] REPEATED TO VERIFY PLATELET COUNT CONFIRMED BY SMEAR    nRBC 0.2 0.0 - 0.2 %   Neutrophils Relative % 79 %   Neutro Abs 11.9 (H) 1.7 - 7.7 K/uL   Lymphocytes Relative 12 %   Lymphs Abs 1.8 0.7 - 4.0 K/uL   Monocytes Relative 7 %   Monocytes Absolute 1.0 0.1 - 1.0 K/uL   Eosinophils Relative 0 %   Eosinophils Absolute 0.0 0.0 - 0.5 K/uL   Basophils Relative 0 %   Basophils Absolute 0.0 0.0 - 0.1 K/uL   WBC Morphology MORPHOLOGY UNREMARKABLE    RBC Morphology See Note    Smear Review Normal platelet morphology    Immature Granulocytes 2 %  Abs Immature Granulocytes 0.22 (H) 0.00 - 0.07 K/uL   Acanthocytes PRESENT    Burr Cells PRESENT    Polychromasia PRESENT     Comment: Performed at [facility], [address], [place], [place] [address]  Comprehensive metabolic panel     Status: Abnormal   Collection Time: 03/24/24 12:52 PM  Result Value Ref Range   Sodium 138 135 - 145 mmol/L   Potassium 3.6 3.5 - 5.1 mmol/L   Chloride 102 98 - 111 mmol/L   CO2 23 22 - 32 mmol/L   Glucose, Bld 121 (H) 70 - 99 mg/dL    Comment: Glucose reference range applies only to samples taken after fasting for at least 8 hours.   BUN 37 (H) 8 - 23 mg/dL   Creatinine, Ser 6.04 (H) 0.44 - 1.00 mg/dL   Calcium  9.0 8.9 - 10.3 mg/dL   Total Protein 6.5 6.5 - 8.1 g/dL   Albumin 3.2 (L) 3.5 - 5.0 g/dL   AST 20 15 - 41 U/L   ALT 12 0 - 44 U/L   Alkaline Phosphatase 100 38 - 126 U/L   Total Bilirubin 3.6 (H) 0.0 - 1.2 mg/dL   GFR, Estimated 31 (L) >60 mL/min    Comment: (NOTE) Calculated using the CKD-EPI Creatinine Equation (2021)    Anion gap 13 5 - 15    Comment: Performed at [facility], [address], [place],  [place] [address]  Lipase, blood     Status: None   Collection Time: 03/24/24 12:52 PM  Result Value Ref Range   Lipase 27 11 - 51 U/L    Comment: Performed at [facility], [address], [place], [place] [address]  Lactic acid, plasma     Status: Abnormal   Collection Time: 03/24/24 12:52 PM  Result Value Ref Range   Lactic Acid, Venous 2.4 (HH) 0.5 - 1.9 mmol/L    Comment: CRITICAL RESULT CALLED TO, READ BACK BY AND VERIFIED WITH S. KERKSTOEL 03/24/24 @1414  BY J. WHITE Performed at [facility], [address] [place], [place] [address]   D-dimer, quantitative     Status: Abnormal   Collection Time: 03/24/24 12:52 PM  Result Value Ref Range   D-Dimer, Quant >20.00 (H) 0.00 - 0.50 ug/mL-FEU    Comment: (NOTE) At the manufacturer cut-off value of 0.5 g/mL FEU, this assay has a negative predictive value of 95-100%.This assay is intended for use in conjunction with a clinical pretest probability (PTP) assessment model to exclude pulmonary embolism (PE) and deep venous thrombosis (DVT) in outpatients suspected of PE or DVT. Results should be correlated with clinical presentation. Performed at [facility], [address] [place], [place] [address]   Culture, blood (routine x 2)     Status: None (Preliminary result)   Collection Time: 03/24/24 12:52 PM   Specimen: BLOOD  Result Value Ref Range   Specimen Description BLOOD LEFT ANTECUBITAL    Special Requests      BOTTLES DRAWN AEROBIC AND ANAEROBIC Blood Culture adequate volume   Culture      NO GROWTH < 24 HOURS Performed at [facility], [address] [place], [place] [address]    Report Status PENDING   CK     Status: None   Collection Time: 03/24/24 12:52 PM  Result Value Ref Range   Total CK 68 38 - 234 U/L    Comment: Performed at [facility], [address], [place], [place] [address]  Brain natriuretic peptide  Status: Abnormal   Collection Time: 03/24/24 12:52 PM  Result Value Ref Range   B Natriuretic Peptide 2,197.0  (H) 0.0 - 100.0 pg/mL    Comment: Performed at [facility], [address], [place], [place] [address]  Procalcitonin     Status: None   Collection Time: 03/24/24 12:52 PM  Result Value Ref Range   Procalcitonin 0.16 ng/mL    Comment:        Interpretation: PCT (Procalcitonin) <= 0.5 ng/mL: Systemic infection (sepsis) is not likely. Local bacterial infection is possible. (NOTE)       Sepsis PCT Algorithm           Lower Respiratory Tract                                      Infection PCT Algorithm    ----------------------------     ----------------------------         PCT < 0.25 ng/mL                PCT < 0.10 ng/mL          Strongly encourage             Strongly discourage   discontinuation of antibiotics    initiation of antibiotics    ----------------------------     -----------------------------       PCT 0.25 - 0.50 ng/mL            PCT 0.10 - 0.25 ng/mL               OR       >80% decrease in PCT            Discourage initiation of                                            antibiotics      Encourage discontinuation           of antibiotics    ----------------------------     -----------------------------         PCT >= 0.50 ng/mL              PCT 0.26 - 0.50 ng/mL               AND        <80% decrease in PCT             Encourage initiation of                                             antibiotics       Encourage continuation           of antibiotics    ----------------------------     -----------------------------        PCT >= 0.50 ng/mL                  PCT > 0.50 ng/mL               AND         increase in PCT  Strongly encourage                                      initiation of antibiotics    Strongly encourage escalation           of antibiotics                                     -----------------------------                                           PCT <= 0.25 ng/mL                                                 OR                                         > 80% decrease in PCT                                      Discontinue / Do not initiate                                             antibiotics  Performed at [facility], [address] [place], [place] [address]   Resp panel by RT-PCR (RSV, Flu A&B, Covid) Anterior Nasal Swab     Status: None   Collection Time: 03/24/24  1:20 PM   Specimen: Anterior Nasal Swab  Result Value Ref Range   SARS Coronavirus 2 by RT PCR NEGATIVE NEGATIVE    Comment: (NOTE) SARS-CoV-2 target nucleic acids are NOT DETECTED.  The SARS-CoV-2 RNA is generally detectable in upper respiratory specimens during the acute phase of infection. The lowest concentration of SARS-CoV-2 viral copies this assay can detect is 138 copies/mL. A negative result does not preclude SARS-Cov-2 infection and should not be used as the sole basis for treatment or other patient management decisions. A negative result may occur with  improper specimen collection/handling, submission of specimen other than nasopharyngeal swab, presence of viral mutation(s) within the areas targeted by this assay, and inadequate number of viral copies(<138 copies/mL). A negative result must be combined with clinical observations, patient history, and epidemiological information. The expected result is Negative.  Fact Sheet for Patients:  BloggerCourse.com  Fact Sheet for Healthcare Providers:  SeriousBroker.it  This test is no t yet approved or cleared by the [place]  FDA and  has been authorized for detection and/or diagnosis of SARS-CoV-2 by FDA under an Emergency Use Authorization (EUA). This EUA will remain  in effect (meaning this test can be used) for the duration of the COVID-19 declaration under Section 564(b)(1) of the Act, 21 U.S.C.section 360bbb-3(b)(1), unless the authorization is terminated  or revoked sooner.       Influenza A  by PCR NEGATIVE NEGATIVE    Influenza B by PCR NEGATIVE NEGATIVE    Comment: (NOTE) The Xpert Xpress SARS-CoV-2/FLU/RSV plus assay is intended as an aid in the diagnosis of influenza from Nasopharyngeal swab specimens and should not be used as a sole basis for treatment. Nasal washings and aspirates are unacceptable for Xpert Xpress SARS-CoV-2/FLU/RSV testing.  Fact Sheet for Patients: BloggerCourse.com  Fact Sheet for Healthcare Providers: SeriousBroker.it  This test is not yet approved or cleared by the [place]  FDA and has been authorized for detection and/or diagnosis of SARS-CoV-2 by FDA under an Emergency Use Authorization (EUA). This EUA will remain in effect (meaning this test can be used) for the duration of the COVID-19 declaration under Section 564(b)(1) of the Act, 21 U.S.C. section 360bbb-3(b)(1), unless the authorization is terminated or revoked.     Resp Syncytial Virus by PCR NEGATIVE NEGATIVE    Comment: (NOTE) Fact Sheet for Patients: BloggerCourse.com  Fact Sheet for Healthcare Providers: SeriousBroker.it  This test is not yet approved or cleared by the [place]  FDA and has been authorized for detection and/or diagnosis of SARS-CoV-2 by FDA under an Emergency Use Authorization (EUA). This EUA will remain in effect (meaning this test can be used) for the duration of the COVID-19 declaration under Section 564(b)(1) of the Act, 21 U.S.C. section 360bbb-3(b)(1), unless the authorization is terminated or revoked.  Performed at [facility], [address] [place], [place] [address]   Lactic acid, plasma     Status: Abnormal   Collection Time: 03/24/24  2:33 PM  Result Value Ref Range   Lactic Acid, Venous 2.1 (HH) 0.5 - 1.9 mmol/L    Comment: CRITICAL RESULT CALLED TO, READ BACK BY AND VERIFIED WITH KIRKSTOEL,S  ON 03/24/24 AT 1520 BY LOY,C Performed at [facility], [address] [place], [place] [address]   Culture, blood (routine x 2)     Status: None (Preliminary result)   Collection Time: 03/24/24  2:33 PM   Specimen: BLOOD  Result Value Ref Range   Specimen Description BLOOD BLOOD LEFT ARM    Special Requests      BOTTLES DRAWN AEROBIC ONLY Blood Culture results may not be optimal due to an inadequate volume of blood received in culture bottles   Culture      NO GROWTH < 24 HOURS Performed at [facility], [address], [place], [place] [address]    Report Status PENDING   DIC Panel Once     Status: Abnormal   Collection Time: 03/24/24  4:40 PM  Result Value Ref Range   Prothrombin Time 26.8 (H) 11.4 - 15.2 seconds   INR 2.5 (H) 0.8 - 1.2    Comment: (NOTE) INR goal varies based on device and disease states.    aPTT 51 (H) 24 - 36 seconds    Comment:        IF BASELINE aPTT IS ELEVATED, SUGGEST PATIENT RISK ASSESSMENT BE USED TO DETERMINE APPROPRIATE ANTICOAGULANT THERAPY.    Fibrinogen 60 (LL) 210 - 475 mg/dL    Comment: REPEATED TO VERIFY CRITICAL RESULT CALLED TO, READ BACK BY AND VERIFIED WITH: SUSAN KERKSTOEL @ 1756 ON 03/24/24 C VARNER (NOTE) Fibrinogen results may be underestimated in patients receiving thrombolytic therapy.    D-Dimer, Quant >20.00 (H) 0.00 - 0.50 ug/mL-FEU    Comment: (NOTE) At the manufacturer cut-off value of 0.5 g/mL FEU, this assay has a negative predictive value of 95-100%.This assay is intended for use in conjunction  with a clinical pretest probability (PTP) assessment model to exclude pulmonary embolism (PE) and deep venous thrombosis (DVT) in outpatients suspected of PE or DVT. Results should be correlated with clinical presentation.    Platelets 31 (L) 150 - 400 K/uL    Comment: SPECIMEN CHECKED FOR CLOTS Immature Platelet Fraction may be clinically indicated, consider ordering this additional test [id number] REPEATED TO VERIFY    Smear Review NO SCHISTOCYTES SEEN     Comment: Performed at [facility], [address], [place], [place] [address]  Urinalysis, Routine w reflex microscopic -Urine, Clean Catch     Status: Abnormal   Collection Time: 03/24/24  6:15 PM  Result Value Ref Range   Color, Urine YELLOW YELLOW   APPearance HAZY (A) CLEAR   Specific Gravity, Urine 1.020 1.005 - 1.030   pH 5.0 5.0 - 8.0   Glucose, UA NEGATIVE NEGATIVE mg/dL   Hgb urine dipstick LARGE (A) NEGATIVE   Bilirubin Urine NEGATIVE NEGATIVE   Ketones, ur NEGATIVE NEGATIVE mg/dL   Protein, ur 30 (A) NEGATIVE mg/dL   Nitrite POSITIVE (A) NEGATIVE   Leukocytes,Ua LARGE (A) NEGATIVE   RBC / HPF 21-50 0 - 5 RBC/hpf   WBC, UA >50 0 - 5 WBC/hpf   Bacteria, UA RARE (A) NONE SEEN   Squamous Epithelial / HPF 0-5 0 - 5 /HPF   WBC Clumps PRESENT     Comment: Performed at [facility], [address], [place], [place] [address]  Strep pneumoniae urinary antigen     Status: None   Collection Time: 03/24/24  7:48 PM  Result Value Ref Range   Strep Pneumo Urinary Antigen NEGATIVE NEGATIVE    Comment:        Infection due to S. pneumoniae cannot be absolutely ruled out since the antigen present may be below the detection limit of the test. Performed at [facility], [address] [place], [place] [address]   Protime-INR     Status: Abnormal   Collection Time: 03/25/24  4:18 AM  Result Value Ref Range   Prothrombin Time 25.0 (H) 11.4 - 15.2 seconds   INR 2.2 (H) 0.8 - 1.2    Comment: (NOTE) INR goal varies based on device and disease states. Performed at [facility], [address] [place], [place] [address]   Hepatic function panel     Status: Abnormal   Collection Time: 03/25/24  4:18 AM  Result Value Ref Range   Total Protein 5.7 (L) 6.5 - 8.1 g/dL   Albumin 2.8 (L) 3.5 - 5.0 g/dL   AST 15 15 - 41 U/L   ALT 10 0 - 44 U/L   Alkaline Phosphatase 84 38 - 126 U/L   Total Bilirubin 2.3 (H) 0.0 - 1.2 mg/dL   Bilirubin, Direct 0.7 (H) 0.0 - 0.2 mg/dL   Indirect Bilirubin 1.6 (H) 0.3 - 0.9  mg/dL    Comment: Performed at [facility], [address], [place], [place] [address]  CBC with Differential/Platelet     Status: Abnormal   Collection Time: 03/25/24  4:18 AM  Result Value Ref Range   WBC 16.6 (H) 4.0 - 10.5 K/uL   RBC 3.79 (L) 3.87 - 5.11 MIL/uL   Hemoglobin 11.2 (L) 12.0 - 15.0 g/dL   HCT 46.9 62.9 - 52.8 %   MCV 98.2 80.0 - 100.0 fL   MCH 29.6 26.0 - 34.0 pg   MCHC 30.1 30.0 - 36.0 g/dL   RDW 41.3 (H) 24.4 -  15.5 %   Platelets 34 (L) 150 - 400 K/uL    Comment: SPECIMEN CHECKED FOR CLOTS Immature Platelet Fraction may be clinically indicated, consider ordering this additional test [id number] REPEATED TO VERIFY PLATELET COUNT CONFIRMED BY SMEAR    nRBC 0.0 0.0 - 0.2 %   Neutrophils Relative % 84 %   Neutro Abs 13.8 (H) 1.7 - 7.7 K/uL   Lymphocytes Relative 9 %   Lymphs Abs 1.6 0.7 - 4.0 K/uL   Monocytes Relative 6 %   Monocytes Absolute 1.1 (H) 0.1 - 1.0 K/uL   Eosinophils Relative 0 %   Eosinophils Absolute 0.0 0.0 - 0.5 K/uL   Basophils Relative 0 %   Basophils Absolute 0.0 0.0 - 0.1 K/uL   WBC Morphology MORPHOLOGY UNREMARKABLE    RBC Morphology MORPHOLOGY UNREMARKABLE    Smear Review PLATELET COUNT CONFIRMED BY SMEAR    Immature Granulocytes 1 %   Abs Immature Granulocytes 0.16 (H) 0.00 - 0.07 K/uL    Comment: Performed at [facility], [address], [place], [place] [address]  Basic metabolic panel     Status: Abnormal   Collection Time: 03/25/24  4:18 AM  Result Value Ref Range   Sodium 140 135 - 145 mmol/L   Potassium 3.3 (L) 3.5 - 5.1 mmol/L   Chloride 107 98 - 111 mmol/L   CO2 25 22 - 32 mmol/L   Glucose, Bld 90 70 - 99 mg/dL    Comment: Glucose reference range applies only to samples taken after fasting for at least 8 hours.   BUN 36 (H) 8 - 23 mg/dL   Creatinine, Ser 1.19 (H) 0.44 - 1.00 mg/dL   Calcium  8.3 (L) 8.9 - 10.3 mg/dL   GFR, Estimated 33 (L) >60 mL/min    Comment: (NOTE) Calculated using the CKD-EPI Creatinine Equation  (2021)    Anion gap 8 5 - 15    Comment: Performed at [facility], [address] [place], [place] [address]  Magnesium      Status: None   Collection Time: 03/25/24  4:18 AM  Result Value Ref Range   Magnesium  2.2 1.7 - 2.4 mg/dL    Comment: Performed at [facility], [address], [place], [place] [address]      RADIOGRAPHY: CT Angio Chest PE W/Cm &/Or Wo Cm Result Date: 03/24/2024 CLINICAL DATA:  Pulmonary embolism suspected EXAM: CT ANGIOGRAPHY CHEST WITH CONTRAST TECHNIQUE: Multidetector CT imaging of the chest was performed using the standard protocol during bolus administration of intravenous contrast. Multiplanar CT image reconstructions and MIPs were obtained to evaluate the vascular anatomy. Multiplanar image (3D post-processing) reconstructions and MIPs were obtained to evaluate the vascular anatomy. RADIATION DOSE REDUCTION: This exam was performed according to the departmental dose-optimization program which includes automated exposure control, adjustment of the mA and/or kV according to patient size and/or use of iterative reconstruction technique. CONTRAST:  75mL OMNIPAQUE  IOHEXOL  350 MG/ML SOLN COMPARISON:  None Available. FINDINGS: Cardiovascular: No evidence of pulmonary embolism to the proximal segmental level. Heart is enlarged. Coronary artery atherosclerotic vascular disease is present in the left coronary territory. A left chest wall implant is present with leads terminating in the heart. Main pulmonary artery is at upper limits of normal for diameter. Mediastinum/Nodes: There is limited evaluation for mediastinal lymphadenopathy secondary to patient motion, beam hardening artifact, and phase of contrast agent acquisition. Although not clearly seen, enlarged nodal tissue is suspected. A possible enlarged lymph node is annotated adjacent to the proximal right mainstem bronchus  estimated at 1.2 cm on image 106 of series 8. Lungs/Pleura: The lesion is present in the medial right  upper lobe which measures 5.0 x 4.3 cm on image 95 of CT series 8. This is solid and extends medially into the mediastinum without clearly defined margins. Peripheral to this lesion there are areas of airspace consolidation which may be postobstructive. Additionally, the pulmonary arterial structures which course through this lesion are narrowed. Trace right-sided pleural effusion. Upper Abdomen: Degraded by patient motion Musculoskeletal: A sclerotic lesion is present in the right aspect of the sternum on image 76 of CT series 8 measuring 1.2 x 0.8 cm. Review of the MIP images confirms the above findings. IMPRESSION: 1. Motion degraded examination. 2. No evidence of pulmonary embolism to the proximal segmental level. 3. A right upper lobe mass is favored which measures approximately 5.0 cm and abuts the right mediastinum with indistinct margins. Additionally, there is suspected mediastinal lymphadenopathy, and postobstructive changes. 4. A sclerotic lesion is present in the sternum which may represent a focus of bony metastasis. 5. Enlarged heart with coronary artery atherosclerotic vascular disease. Electronically Signed   By: Reagan Camera M.D.   On: 03/24/2024 16:02   CT ABDOMEN PELVIS W CONTRAST Result Date: 03/24/2024 CLINICAL DATA:  Epigastric pain. EXAM: CT ABDOMEN AND PELVIS WITH CONTRAST TECHNIQUE: Multidetector CT imaging of the abdomen and pelvis was performed using the standard protocol following bolus administration of intravenous contrast. RADIATION DOSE REDUCTION: This exam was performed according to the departmental dose-optimization program which includes automated exposure control, adjustment of the mA and/or kV according to patient size and/or use of iterative reconstruction technique. CONTRAST:  75mL OMNIPAQUE  IOHEXOL  350 MG/ML SOLN COMPARISON:  CT abdomen pelvis dated 01/29/2024. FINDINGS: Evaluation of this exam is limited due to respiratory motion. Lower chest: There is mild eventration of  the right hemidiaphragm. There are bibasilar atelectasis. Small right pleural effusion. There is cardiomegaly. No intra-abdominal free air.  Small free fluid in the pelvis. Hepatobiliary: Fatty liver. No biliary dilatation. Small gallstones. Pancreas: Unremarkable. No pancreatic ductal dilatation or surrounding inflammatory changes. Spleen: Normal in size without focal abnormality. Adrenals/Urinary Tract: The adrenal glands unremarkable. Mild bilateral renal parenchyma atrophy. There is no hydronephrosis on either side. The visualized ureters appear unremarkable. Air within the urinary bladder may have been introduced by recent instrumentation. Stomach/Bowel: There is sigmoid diverticulosis with muscular hypertrophy. No active inflammatory changes. There is no bowel obstruction. The appendix is normal. Vascular/Lymphatic: Advanced aortoiliac atherosclerotic disease. The IVC is unremarkable. No portal venous gas. There is no adenopathy. Reproductive: Hysterectomy.  No suspicious adnexal masses. Other: Diffuse subcutaneous edema and anasarca. Musculoskeletal: Osteopenia with degenerative changes of spine. No acute osseous pathology. IMPRESSION: 1. No acute intra-abdominal or pelvic pathology. 2. Sigmoid diverticulosis. No bowel obstruction. Normal appendix. 3. Fatty liver. 4. Cholelithiasis. 5. Cardiomegaly with small right pleural effusion. 6.  Aortic Atherosclerosis (ICD10-I70.0). Electronically Signed   By: Angus Bark M.D.   On: 03/24/2024 16:01   DG Chest Port 1 View Result Date: 03/24/2024 CLINICAL DATA:  Cough EXAM: PORTABLE CHEST 1 VIEW COMPARISON:  04/19/2015 FINDINGS: Left-sided multi lead pacing device. Cardiomegaly with aortic atherosclerosis. Mild diffuse interstitial opacities with suspected small right pleural effusion and heterogeneous airspace disease at right base. Vague right paratracheal opacity. Probable skin fold artifact over the right lateral lower chest. IMPRESSION: 1. Cardiomegaly  with mild diffuse interstitial opacities which may be due to edema or infection. Suspected small right pleural effusion with heterogeneous airspace disease at right  base which may be due to atelectasis or pneumonia. 2. Vague right paratracheal opacity, which may be due to adenopathy, mass or infiltrate. Electronically Signed   By: Esmeralda Hedge M.D.   On: 03/24/2024 15:34   CT Head Wo Contrast Result Date: 03/24/2024 CLINICAL DATA:  Altered mental status, weakness EXAM: CT HEAD WITHOUT CONTRAST CT CERVICAL SPINE WITHOUT CONTRAST TECHNIQUE: Multidetector CT imaging of the head and cervical spine was performed following the standard protocol without intravenous contrast. Multiplanar CT image reconstructions of the cervical spine were also generated. RADIATION DOSE REDUCTION: This exam was performed according to the departmental dose-optimization program which includes automated exposure control, adjustment of the mA and/or kV according to patient size and/or use of iterative reconstruction technique. COMPARISON:  04/30/2015 FINDINGS: CT HEAD FINDINGS Brain: No evidence of acute infarction, hemorrhage, hydrocephalus, extra-axial collection or mass lesion/mass effect. Periventricular white matter hypodensity. Small lacunar infarction of the left caudate (series 2, image 13). Small focus of encephalomalacia of the posterior left frontal lobe (series 2, image 16). Vascular: No hyperdense vessel or unexpected calcification. Skull: Normal. Negative for fracture or focal lesion. Sinuses/Orbits: No acute finding. Other: None. CT CERVICAL SPINE FINDINGS Alignment: Degenerative straightening of the normal cervical lordosis. Skull base and vertebrae: No acute fracture. No primary bone lesion or focal pathologic process. Soft tissues and spinal canal: No prevertebral fluid or swelling. No visible canal hematoma. Disc levels:  Severe multilevel cervical disc degenerative disease. Upper chest: Negative. Other: None. IMPRESSION:  1. No acute intracranial pathology. 2. 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 Small focus of encephalomalacia of the posterior left frontal lobe. 3. No fracture or static subluxation of the cervical spine. 4. Severe multilevel cervical disc degenerative disease. Electronically Signed   By: Fredricka Jenny M.D.   On: 03/24/2024 14:29   CT Cervical Spine Wo Contrast Result Date: 03/24/2024 CLINICAL DATA:  Altered mental status, weakness EXAM: CT HEAD WITHOUT CONTRAST CT CERVICAL SPINE WITHOUT CONTRAST TECHNIQUE: Multidetector CT imaging of the head and cervical spine was performed following the standard protocol without intravenous contrast. Multiplanar CT image reconstructions of the cervical spine were also generated. RADIATION DOSE REDUCTION: This exam was performed according to the departmental dose-optimization program which includes automated exposure control, adjustment of the mA and/or kV according to patient size and/or use of iterative reconstruction technique. COMPARISON:  04/30/2015 FINDINGS: CT HEAD FINDINGS Brain: No evidence of acute infarction, hemorrhage, hydrocephalus, extra-axial collection or mass lesion/mass effect. Periventricular white matter hypodensity. Small lacunar infarction of the left caudate (series 2, image 13). Small focus of encephalomalacia of the posterior left frontal lobe (series 2, image 16). Vascular: No hyperdense vessel or unexpected calcification. Skull: Normal. Negative for fracture or focal lesion. Sinuses/Orbits: No acute finding. Other: None. CT CERVICAL SPINE FINDINGS Alignment: Degenerative straightening of the normal cervical lordosis. Skull base and vertebrae: No acute fracture. No primary bone lesion or focal pathologic process. Soft tissues and spinal canal: No prevertebral fluid or swelling. No visible canal hematoma. Disc levels:  Severe multilevel cervical disc degenerative disease. Upper chest: Negative. Other: None.  IMPRESSION: 1. No acute intracranial pathology. 2. 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 Small focus of encephalomalacia of the posterior left frontal lobe. 3. No fracture or static subluxation of the cervical spine. 4. Severe multilevel cervical disc degenerative disease. Electronically Signed   By: Fredricka Jenny M.D.   On: 03/24/2024 14:29     Bilateral lower extremity Doppler  ultrasound: IMPRESSION: Right posterior tibial vein thrombus.   Right-sided presumed Baker's cyst or other synovial cyst.  ASSESSMENT:  83yo F with past medical history of systolic heart failure, COPD presenting with shortness of breath and pneumonia.  CT scan revealed a right lung mass and labs showed thrombocytopenia  PLAN:   1.  DIC: Likely secondary to sepsis and malignancy even though lung cancer is not a very common cause of DIC Fibrinogen: 60, PT, PTT, INR: Elevated D-dimer: Elevated Peripheral smear: No schistocytes  -Trend labs daily - Transfuse cryoprecipitate to keep fibrinogen above 100 to prevent bleeding - Would avoid FFP considering patient has heart failure and there is a risk of volume overload - Please report if patient has acute bleeding - Continue antibiotics for sepsis   2.  Thrombocytopenia: Likely secondary to DIC  -Continue to monitor counts at this time - Will avoid anticoagulation until platelets are above 50  3.  Posterior tibial DVT: Provoked clot likely secondary to malignancy and DIC  - Would not anticoagulate at this time considering platelets are less than 50 - Would start anticoagulation with heparin  when platelets are greater than 50 - Can be transition to DOAC on discharge if platelets are above 50  4.  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  All questions were answered.  Thank you for involving us  in this patient's care.  Please reach out with any questions or concerns.  The total time spent was 75 minutes including review of chart and various tests results, discussions about plan of care and coordination of care plan  Eduardo Grade, MD Hematology/Oncology [facility] at [facility]

## 2024-03-25 NOTE — Evaluation (Addendum)
 Physical Therapy Evaluation Patient Details Name: Nicole Clements MRN: [id number] DOB: [birth year] Today's Date: 03/25/2024  History of Present Illness  Nicole Clements  is a 83 y.o. female, with past medical history of CAD, ischemic cardiomyopathy, chronic systolic CHF, AICD, hypertension, hyperlipidemia, tobacco abuse, still smoking.  - Patient presents to ED secondary to weakness, cough and shortness of breath, reports she had a fall, she was unable to get up from the floor until family came, she was hypoxic on presentation, she does not use oxygen at home at baseline, denies nausea, vomiting or diarrhea, per family she has been progressively weak, with significant weight loss.  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    Clinical Impression  Patient tolerated PT/OT evaluation well. At baseline, patient reports she is independent with all ADL and iADLs. Reports no use of AD for ambulation or any equipment use. Patient reports she fell at home after tripping over something in her home and she was unable to get herself up. On this date, patient is CGA for bed mobility, and min A for STS/functional transfer and ambulation. Patient demonstrated a LOB when standing from bed and recliner and unsteadiness with first few steps without RW. W/ RW, patient initially still unsteady requiring CGA throughout. Patient's SpO2 during ambulation drops to 88% at lowest on 1 Lpm [place]. Patient left on 2 Lpm in recliner with call bell in reach. Patient will benefit from continued skilled physical therapy acutely and in recommended venue at this time (may change with patient improvement) in order to address the above for patient  to improve function and QOL.        If plan is discharge home, recommend the following: A little help with walking and/or transfers;A little help with bathing/dressing/bathroom;Assistance with cooking/housework;Help with stairs or ramp for entrance   Can travel by private vehicle   No    Equipment Recommendations Rolling walker (2 wheels)  Recommendations for Other Services       Functional Status Assessment Patient has had a recent decline in their functional status and demonstrates the ability to make significant improvements in function in a reasonable and predictable amount of time.     Precautions / Restrictions Precautions Precautions: Fall Recall of Precautions/Restrictions: Intact Restrictions Weight Bearing Restrictions Per Provider Order: No      Mobility  Bed Mobility Overal bed mobility: Needs Assistance Bed Mobility: Supine to Sit     Supine to sit: Contact guard     General bed mobility comments: Slow labored movement. CGA for safety. HOB flat. use of railings.    Transfers Overall transfer level: Needs assistance Equipment used: Rolling walker (2 wheels), None Transfers: Sit to/from Stand, Bed to chair/wheelchair/BSC Sit to Stand: Min assist   Step pivot transfers: Contact guard assist, Min assist       General transfer comment: Min A during STS w/o RW due to imbalance. Min/CGA during transfer for safety and mild imbalance/slow labored movement    Ambulation/Gait Ambulation/Gait assistance: Contact guard assist, Min assist Gait Distance (Feet): 40 Feet Assistive device: Rolling walker (2 wheels) Gait Pattern/deviations: Step-through pattern, Decreased step length - right, Decreased step length - left, Decreased stride length, Trunk flexed, Drifts right/left Gait velocity: Decreased  General Gait Details: Pt w/ the above deviations. Demo LOB without RW. Generally unsteady t/o ambulation requiring min/CGA. Slow labored movement  Stairs             Wheelchair Mobility     Tilt Bed    Modified [place] (Stroke Patients Only)       Balance Overall balance assessment: Needs assistance Sitting-balance support: Feet supported, No upper extremity supported Sitting balance-Leahy Scale: Fair Sitting balance - Comments: Fair/good seated EOB   Standing balance support: During functional activity, Reliant on assistive device for balance, Bilateral upper extremity supported Standing balance-Leahy Scale: Fair Standing balance comment: Fair w/ RW                             Pertinent Vitals/Pain Pain Assessment Pain Assessment: No/denies pain    Home Living Family/patient expects to be discharged to:: Private residence Living Arrangements: Alone Available Help at Discharge: Family;Friend(s);Available PRN/intermittently Type of Home: House Home Access: Stairs to enter Entrance Stairs-Rails: Can reach both;Left;Right Entrance Stairs-Number of Steps: 2-3   Home Layout: One level Home Equipment: None      Prior Function Prior Level of Function : Independent/Modified Independent             Mobility Comments: Household ambulator with no AD ADLs Comments: Independent with ADL/iADL     Extremity/Trunk Assessment   Upper Extremity Assessment Upper Extremity Assessment: Defer to OT evaluation    Lower Extremity Assessment Lower Extremity Assessment: Generalized weakness (Generally weak but adequate for functional tasks. 3+/5 at best for all LE MMT)    Cervical / Trunk Assessment Cervical / Trunk Assessment: Kyphotic  Communication   Communication Communication: No apparent difficulties    Cognition Arousal: Alert, Lethargic Behavior During Therapy: WFL for tasks assessed/performed                           PT - Cognition Comments: A&O x4 Following commands: Intact       Cueing Cueing Techniques: Verbal cues     General Comments      Exercises     Assessment/Plan     PT Assessment Patient needs continued PT services;All further PT needs can be met in the next venue of care  PT Problem List Decreased strength;Decreased activity tolerance;Decreased balance;Decreased mobility       PT Treatment Interventions Gait training;Stair training;Functional mobility training;Therapeutic activities;Therapeutic exercise;Balance training;Patient/family education    PT Goals (Current goals can be found in the Care Plan section)  Acute Rehab PT Goals Patient Stated Goal: Return home with approp support PT Goal Formulation: With patient Time For Goal Achievement: 04/01/24 Potential to Achieve Goals: Good    Frequency Min 3X/week     Co-evaluation PT/OT/SLP Co-Evaluation/Treatment: Yes Reason for Co-Treatment: To address functional/ADL transfers PT goals addressed during session: Mobility/safety with mobility         AM-PAC PT "6 Clicks" Mobility  Outcome Measure Help needed turning from your back to your side while in a flat bed without using bedrails?: A Little Help needed moving from lying on your back to sitting on the side of a flat bed without using bedrails?: A Little Help needed moving to and from a bed to a chair (including a wheelchair)?: A Little Help needed standing up from a chair using your arms (e.g., wheelchair or bedside chair)?: A Little Help needed to walk in hospital room?: A Little Help needed climbing  3-5 steps with a railing? : A Lot 6 Click Score: 17    End of Session   Activity Tolerance: Patient tolerated treatment well;Patient limited by fatigue Patient left: in chair;with call bell/phone within reach;with chair alarm set   PT Visit Diagnosis: Unsteadiness on feet (R26.81);History of falling (Z91.81);Muscle weakness (generalized) (M62.81);Other abnormalities of gait and mobility (R26.89)    Time: 1610-9604 PT Time Calculation (min) (ACUTE ONLY): 28 min   Charges:   PT Evaluation $PT Eval Moderate Complexity: 1 Mod PT  Treatments $Therapeutic Activity: 23-37 mins PT General Charges $$ ACUTE PT VISIT: 1 Visit        12:25 PM, 03/25/24 Dianah Pruett Powell-Butler, PT, DPT [place] with [facility]

## 2024-03-25 NOTE — Plan of Care (Signed)
   Problem: Education: Goal: Knowledge of General Education information will improve Description Including pain rating scale, medication(s)/side effects and non-pharmacologic comfort measures Outcome: Progressing

## 2024-03-25 NOTE — Progress Notes (Signed)
 PROGRESS NOTE  PERL FOLMAR UJW:[id number] DOB: 01-Jul-1941 DOA: 03/24/2024 PCP: Glena Landau, MD  HPI/Recap of past 24 hours: Nicole Clements is a 83 y.o. female, with PMH of CAD, ischemic cardiomyopathy, chronic systolic CHF, AICD, HTN, HLD, current tobacco abuse, presents to ED c/o generalized progressive weakness, weight loss, cough, SOB, recent fall, but denied LOC PTA. Pt was hypoxic on presentation, she does not use oxygen at home. In the ED, vital signs showed tachypnea, with soft BP.  Labs showed creatinine 1.66, T. bili 3.6, BNP greater than 2000, CK WNL, lactic acid 2.4--2.1, WBC 14.9, D-dimer greater than 20, fibrinogen less than 60, elevated INR/PT, noted to be in DIC. CTA chest with motion degraded exam, no evidence of PE to the proximal segmental level, noted right upper lobe mass about 5 cm with suspected mediastinal lymphadenopathy and postobstructive changes, noted sclerotic lesion present in the sternum which may represent a focus of bony mets.  Heme/oncology consulted. Patient admitted for further management.    Today, patient reports generalized weakness, still with shortness of breath, denies any pain, chest pain, fever/chills, nausea/vomiting.    Assessment/Plan: Principal Problem:   Pneumonia Active Problems:   TOBACCO USER   CAD- remote MI '90s, distal RCA DES 07/17/14   Chronic systolic CHF (congestive heart failure), NYHA class 3 (HCC)   Stroke (HCC)   ICD (implantable cardioverter-defibrillator) in place   DIC (disseminated intravascular coagulation) (HCC)   Lung mass   Severe sepsis likely 2/2 postobstructive pneumonia vs ?UTI Acute respiratory failure with hypoxia On presentation, tachypneic, leukocytosis, elevated lactic acid Currently afebrile, with leukocytosis BC x 2 pending Strep pneumo negative, Legionella pending CTA chest with motion degraded exam, no evidence of PE to the proximal segmental level, noted right upper lobe mass about 5 cm with  suspected mediastinal lymphadenopathy and postobstructive changes, noted sclerotic lesion present in the sternum which may represent a focus of bony mets Continue azithromycin, ceftriaxone (noted CKD, low threshold to escalate antibiotics due to possible DIC, if patient continues to worsen) Continue DuoNebs, incentive spirometer, flutter valve Supplemental O2 as needed Monitor very closely  Possible UTI UA showed large leukocytes, hemoglobin, positive nitrites, rare bacteria, greater than 50 WBC UC pending Continue IV ceftriaxone  Hypokalemia Replace as needed   Likely DIC  Noted elevated D-dimers, thrombocytopenia, low fibrinogen, elevated PT/APTT/INR, peripheral smear, no schistocytes Likely 2/2 sepsis versus malignancy Heme-onc consulted, recommend transfuse cryoprecipitate to keep fibrinogen above 100 to prevent bleeding, avoid FFP given history of heart failure Transfuse cryoprecipitate today Continue antibiotics for sepsis Monitor closely for any bleeding  Thrombocytopenia Likely 2/2 above Avoid anticoagulation until platelets are greater than 50  Lung mass suspicious for malignancy, 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 Creatinine around baseline Daily CMP  Hyperbilirubinemia CT abdomen/pelvis with no evidence of metastatic disease Possibly from sepsis Trend LFTs   Ischemic cardiomyopathy Chronic systolic CHF AICD Does not appear overloaded, although BNP greater than 2000 Denies any chest pain Chest x-ray with cardiomegaly and mild diffuse interstitial opacities possibly due to edema/infection EKG with no acute ST changes Echo showed EF of 20 to 25%, global hypokinesis, previous EF noted to be 25 to 30% in 2022, has AICD in place Hold BP meds due to soft BP in light of sepsis/DIC (hold Coreg , Lasix , losartan , Aldactone ) Telemetry   Hx of CVA/HLD Resume aspirin  when platelets stabilize Hold home statin due to elevated bilirubin  GOC discussion Patient with very poor prognosis overall Palliative consulted     Estimated body mass index is 22.5 kg/m as calculated from the following:   Height as of this encounter: 5\' 2"  (1.575 m).   Weight as of this encounter: 55.8 kg.     Code Status: Full  Family Communication: None at bedside  Disposition Plan: Status is: Inpatient Remains inpatient appropriate because: Level of care      Consultants: Hematology/oncology  Procedures: None  Antimicrobials: Ceftriaxone Zithromycin  DVT prophylaxis: SCDs   Objective: Vitals:   03/25/24 0436 03/25/24 1236 03/25/24 1545 03/25/24 1610  BP: 121/60 (!) 107/47 (!) 98/58 (!) 102/54  Pulse: 72 71 66 69  Resp: 14  15 16   Temp: 97.7 F (36.5 C) (!) 97.5 F (36.4 C) 98 F (36.7 C) 97.9 F (36.6 C)  TempSrc: Oral Oral Oral   SpO2: 94% 93% 98%   Weight:      Height:        Intake/Output Summary (Last 24 hours) at 03/25/2024 1646 Last data filed at 03/25/2024 1237 Gross per 24 hour  Intake 830 ml  Output 100 ml  Net 730 ml    Filed Weights   03/24/24 1228  Weight: 55.8 kg    Exam: General: NAD, very deconditioned Cardiovascular: S1, S2 present Respiratory: CTAB Abdomen: Soft, nontender, nondistended, bowel sounds present Musculoskeletal: No bilateral pedal edema noted Skin: No obvious signs of petechia/bruising noted Psychiatry: Fair mood     Data Reviewed: CBC: Recent Labs  Lab 03/24/24 1252 03/24/24 1640 03/25/24 0418  WBC 14.9*  --  16.6*  NEUTROABS 11.9*  --  13.8*  HGB 12.4  --  11.2*  HCT 40.7  --  37.2  MCV 95.8  --  98.2  PLT 34* 31* 34*   Basic Metabolic Panel: Recent Labs  Lab 03/24/24 1252 03/25/24 0418  NA 138 140  K 3.6 3.3*  CL 102 107  CO2 23 25  GLUCOSE 121* 90  BUN 37* 36*  CREATININE 1.66* 1.57*  CALCIUM  9.0 8.3*  MG  --  2.2   GFR: Estimated Creatinine Clearance: 22.2 mL/min (A) (by C-G formula based on SCr of 1.57 mg/dL (H)). Liver Function Tests: Recent Labs  Lab 03/24/24 1252 03/25/24 0418  AST 20 15  ALT 12 10  ALKPHOS 100 84  BILITOT 3.6* 2.3*  PROT 6.5 5.7*  ALBUMIN 3.2* 2.8*   Recent Labs  Lab 03/24/24 1252  LIPASE 27   No results for input(s): "AMMONIA" in the last 168 hours. Coagulation Profile: Recent Labs  Lab 03/24/24 1640 03/25/24 0418  INR 2.5* 2.2*   Cardiac Enzymes: Recent Labs  Lab 03/24/24 1252  CKTOTAL 68   BNP (last  3 results) No results for input(s): "PROBNP" in the last 8760 hours. HbA1C: No results for input(s): "HGBA1C" in the last 72 hours. CBG: No results for input(s): "GLUCAP" in the last 168 hours. Lipid Profile: No results for input(s): "CHOL", "HDL", "LDLCALC", "TRIG", "CHOLHDL", "LDLDIRECT" in the last 72 hours. Thyroid  Function Tests: No results for input(s): "TSH", "T4TOTAL", "FREET4", "T3FREE", "THYROIDAB" in the last 72 hours. Anemia Panel: No results for input(s): "VITAMINB12", "FOLATE", "FERRITIN", "TIBC", "IRON", "RETICCTPCT" in the last 72 hours. Urine analysis:    Component Value  Date/Time   COLORURINE YELLOW 03/24/2024 1815   APPEARANCEUR HAZY (A) 03/24/2024 1815   LABSPEC 1.020 03/24/2024 1815   PHURINE 5.0 03/24/2024 1815   GLUCOSEU NEGATIVE 03/24/2024 1815   HGBUR LARGE (A) 03/24/2024 1815   BILIRUBINUR NEGATIVE 03/24/2024 1815   KETONESUR NEGATIVE 03/24/2024 1815   PROTEINUR 30 (A) 03/24/2024 1815   UROBILINOGEN 0.2 04/29/2015 1746   NITRITE POSITIVE (A) 03/24/2024 1815   LEUKOCYTESUR LARGE (A) 03/24/2024 1815   Sepsis Labs: @LABRCNTIP (procalcitonin:4,lacticidven:4)  ) Recent Results (from the past 240 hours)  Culture, blood (routine x 2)     Status: None (Preliminary result)   Collection Time: 03/24/24 12:52 PM   Specimen: BLOOD  Result Value Ref Range Status   Specimen Description BLOOD LEFT ANTECUBITAL  Final   Special Requests   Final    BOTTLES DRAWN AEROBIC AND ANAEROBIC Blood Culture adequate volume   Culture   Final    NO GROWTH < 24 HOURS Performed at [facility], [address], [place], [place] [address]    Report Status PENDING  Incomplete  Resp panel by RT-PCR (RSV, Flu A&B, Covid) Anterior Nasal Swab     Status: None   Collection Time: 03/24/24  1:20 PM   Specimen: Anterior Nasal Swab  Result Value Ref Range Status   SARS Coronavirus 2 by RT PCR NEGATIVE NEGATIVE Final    Comment: (NOTE) SARS-CoV-2 target nucleic acids are NOT DETECTED.  The SARS-CoV-2 RNA is generally detectable in upper respiratory specimens during the acute phase of infection. The lowest concentration of SARS-CoV-2 viral copies this assay can detect is 138 copies/mL. A negative result does not preclude SARS-Cov-2 infection and should not be used as the sole basis for treatment or other patient management decisions. A negative result may occur with  improper specimen collection/handling, submission of specimen other than nasopharyngeal swab, presence of viral mutation(s) within the areas targeted by this assay, and inadequate number of viral copies(<138  copies/mL). A negative result must be combined with clinical observations, patient history, and epidemiological information. The expected result is Negative.  Fact Sheet for Patients:  BloggerCourse.com  Fact Sheet for Healthcare Providers:  SeriousBroker.it  This test is no t yet approved or cleared by the [place]  FDA and  has been authorized for detection and/or diagnosis of SARS-CoV-2 by FDA under an Emergency Use Authorization (EUA). This EUA will remain  in effect (meaning this test can be used) for the duration of the COVID-19 declaration under Section 564(b)(1) of the Act, 21 U.S.C.section 360bbb-3(b)(1), unless the authorization is terminated  or revoked sooner.       Influenza A by PCR NEGATIVE NEGATIVE Final   Influenza B by PCR NEGATIVE NEGATIVE Final    Comment: (NOTE) The Xpert Xpress SARS-CoV-2/FLU/RSV plus assay is intended as an aid in the diagnosis of influenza from Nasopharyngeal swab specimens and should not be used as a sole basis for treatment. Nasal washings and aspirates are unacceptable for  Xpert Xpress SARS-CoV-2/FLU/RSV testing.  Fact Sheet for Patients: BloggerCourse.com  Fact Sheet for Healthcare Providers: SeriousBroker.it  This test is not yet approved or cleared by the [place]  FDA and has been authorized for detection and/or diagnosis of SARS-CoV-2 by FDA under an Emergency Use Authorization (EUA). This EUA will remain in effect (meaning this test can be used) for the duration of the COVID-19 declaration under Section 564(b)(1) of the Act, 21 U.S.C. section 360bbb-3(b)(1), unless the authorization is terminated or revoked.     Resp Syncytial Virus by PCR NEGATIVE NEGATIVE Final    Comment: (NOTE) Fact Sheet for Patients: BloggerCourse.com  Fact Sheet for Healthcare  Providers: SeriousBroker.it  This test is not yet approved or cleared by the [place]  FDA and has been authorized for detection and/or diagnosis of SARS-CoV-2 by FDA under an Emergency Use Authorization (EUA). This EUA will remain in effect (meaning this test can be used) for the duration of the COVID-19 declaration under Section 564(b)(1) of the Act, 21 U.S.C. section 360bbb-3(b)(1), unless the authorization is terminated or revoked.  Performed at [facility], [address] [place], [place] [address]   Culture, blood (routine x 2)     Status: None (Preliminary result)   Collection Time: 03/24/24  2:33 PM   Specimen: BLOOD  Result Value Ref Range Status   Specimen Description BLOOD BLOOD LEFT ARM  Final   Special Requests   Final    BOTTLES DRAWN AEROBIC ONLY Blood Culture results may not be optimal due to an inadequate volume of blood received in culture bottles   Culture   Final    NO GROWTH < 24 HOURS Performed at [facility], [address], [place], [place] [address]    Report Status PENDING  Incomplete      Studies: US  Venous Img Lower Bilateral (DVT) Result Date: 03/25/2024 CLINICAL DATA:  Elevated d-dimer EXAM: BILATERAL LOWER EXTREMITY VENOUS DOPPLER ULTRASOUND TECHNIQUE: Gray-scale sonography with graded compression, as well as color Doppler and duplex ultrasound were performed to evaluate the lower extremity deep venous systems from the level of the common femoral vein and including the common femoral, femoral, profunda femoral, popliteal and calf veins including the posterior tibial, peroneal and gastrocnemius veins when visible. The superficial great saphenous vein was also interrogated. Spectral Doppler was utilized to evaluate flow at rest and with distal augmentation maneuvers in the common femoral, femoral and popliteal veins. COMPARISON:  None Available. FINDINGS: RIGHT LOWER EXTREMITY Common Femoral Vein: No evidence of thrombus. Normal  compressibility, respiratory phasicity and response to augmentation. Saphenofemoral Junction: No evidence of thrombus. Normal compressibility and flow on color Doppler imaging. Profunda Femoral Vein: No evidence of thrombus. Normal compressibility and flow on color Doppler imaging. Femoral Vein: No evidence of thrombus. Normal compressibility, respiratory phasicity and response to augmentation. Popliteal Vein: No evidence of thrombus. Normal compressibility, respiratory phasicity and response to augmentation. Calf Veins: There is poor compression and flow on color Doppler within the right posterior tibial vein. Superficial Great Saphenous Vein: No evidence of thrombus. Normal compressibility. Venous Reflux:  None. Other Findings: Cystic focus in the popliteal fossa measures 3.3 x 0.8 x 1.8 cm which is complex. No abnormal blood flow. Possible Baker's or synovial cyst. LEFT LOWER EXTREMITY Common Femoral Vein: No evidence of thrombus. Normal compressibility, respiratory phasicity and response to augmentation. Saphenofemoral Junction: No evidence of thrombus. Normal compressibility and flow on color Doppler imaging. Profunda Femoral Vein: No evidence of thrombus. Normal compressibility and flow on color Doppler imaging.  Femoral Vein: No evidence of thrombus. Normal compressibility, respiratory phasicity and response to augmentation. Popliteal Vein: No evidence of thrombus. Normal compressibility, respiratory phasicity and response to augmentation. Calf Veins: No evidence of thrombus. Normal compressibility and flow on color Doppler imaging. Superficial Great Saphenous Vein: No evidence of thrombus. Normal compressibility. Venous Reflux:  None. Other Findings:  None. IMPRESSION: Right posterior tibial vein thrombus. Right-sided presumed Baker's cyst or other synovial cyst. Electronically Signed   By: Adrianna Horde M.D.   On: 03/25/2024 12:57   ECHOCARDIOGRAM COMPLETE Result Date: 03/25/2024    ECHOCARDIOGRAM REPORT    Patient Name:   CIARRA BRADDY Date of Exam: 03/25/2024 Medical Rec #:  [id number]     Height:       62.0 in Accession #:    [id number]    Weight:       123.0 lb Date of Birth:  [birth year]     BSA:          1.555 m Patient Age:    83 years      BP:           121/60 mmHg Patient Gender: F             HR:           73 bpm. Exam Location:  Inpatient Procedure: 2D Echo (Both Spectral and Color Flow Doppler were utilized during            procedure). Indications:    Dyspnea R06.00  History:        Patient has prior history of Echocardiogram examinations, most                 recent 05/02/2021.  Sonographer:    Hersey Lorenzo RDCS Referring Phys: [id number] Aminta Baldy J Hania Cerone IMPRESSIONS  1. Left ventricular ejection fraction, by estimation, is 20 to 25%. The left ventricle has severely decreased function. The left ventricle demonstrates global hypokinesis. The left ventricular internal cavity size was severely dilated. Left ventricular diastolic parameters are indeterminate. Elevated left atrial pressure.  2. Right ventricular systolic function is normal. The right ventricular size is normal. There is normal pulmonary artery systolic pressure.  3. Left atrial size was severely dilated.  4. The mitral valve is abnormal. Mild to moderate mitral valve regurgitation. No evidence of mitral stenosis.  5. The tricuspid valve is abnormal.  6. The aortic valve is tricuspid. Aortic valve regurgitation is not visualized. No aortic stenosis is present.  7. The inferior vena cava is normal in size with greater than 50% respiratory variability, suggesting right atrial pressure of 3 mmHg. FINDINGS  Left Ventricle: Left ventricular ejection fraction, by estimation, is 20 to 25%. The left ventricle has severely decreased function. The left ventricle demonstrates global hypokinesis. The left ventricular internal cavity size was severely dilated. There is no left ventricular hypertrophy. Left ventricular diastolic parameters are indeterminate.  Elevated left atrial pressure. Right Ventricle: The right ventricular size is normal. Right vetricular wall thickness was not well visualized. Right ventricular systolic function is normal. There is normal pulmonary artery systolic pressure. The tricuspid regurgitant velocity is 2.42 m/s, and with an assumed right atrial pressure of 3 mmHg, the estimated right ventricular systolic pressure is 26.4 mmHg. Left Atrium: Left atrial size was severely dilated. Right Atrium: Right atrial size was normal in size. Pericardium: There is no evidence of pericardial effusion. Mitral Valve: The mitral valve is abnormal. Mild to moderate mitral valve regurgitation. No evidence of mitral valve stenosis. Tricuspid  Valve: The tricuspid valve is abnormal. Tricuspid valve regurgitation is mild . No evidence of tricuspid stenosis. Aortic Valve: The aortic valve is tricuspid. Aortic valve regurgitation is not visualized. No aortic stenosis is present. Aortic valve mean gradient measures 1.5 mmHg. Aortic valve peak gradient measures 3.9 mmHg. Aortic valve area, by VTI measures 2.00 cm. Pulmonic Valve: The pulmonic valve was not well visualized. Pulmonic valve regurgitation is mild. No evidence of pulmonic stenosis. Aorta: The aortic root and ascending aorta are structurally normal, with no evidence of dilitation. Venous: The inferior vena cava is normal in size with greater than 50% respiratory variability, suggesting right atrial pressure of 3 mmHg. IAS/Shunts: The interatrial septum was not well visualized. Additional Comments: A is visualized in the right atrium and right ventricle.  LEFT VENTRICLE PLAX 2D LVIDd:         6.70 cm      Diastology LVIDs:         6.20 cm      LV e' medial:    5.22 cm/s LV PW:         0.90 cm      LV E/e' medial:  21.1 LV IVS:        0.80 cm      LV e' lateral:   5.66 cm/s LVOT diam:     1.80 cm      LV E/e' lateral: 19.4 LV SV:         37 LV SV Index:   24 LVOT Area:     2.54 cm  LV Volumes (MOD) LV vol  d, MOD A4C: 122.0 ml LV vol s, MOD A4C: 89.1 ml LV SV MOD A4C:     122.0 ml RIGHT VENTRICLE            IVC RV S prime:     9.36 cm/s  IVC diam: 1.50 cm TAPSE (M-mode): 1.5 cm LEFT ATRIUM            Index LA diam:      5.10 cm  3.28 cm/m LA Vol (A2C): 24.7 ml  15.89 ml/m LA Vol (A4C): 100.0 ml 64.31 ml/m  AORTIC VALVE AV Area (Vmax):    2.02 cm AV Area (Vmean):   2.12 cm AV Area (VTI):     2.00 cm AV Vmax:           98.82 cm/s AV Vmean:          55.070 cm/s AV VTI:            0.186 m AV Peak Grad:      3.9 mmHg AV Mean Grad:      1.5 mmHg LVOT Vmax:         78.30 cm/s LVOT Vmean:        45.800 cm/s LVOT VTI:          0.146 m LVOT/AV VTI ratio: 0.78  AORTA Ao Root diam: 2.40 cm Ao Asc diam:  2.50 cm MITRAL VALVE                 TRICUSPID VALVE MV Area (PHT): 2.82 cm      TR Peak grad:   23.4 mmHg MV Decel Time: 269 msec      TR Vmax:        242.00 cm/s MR Peak grad:   115.8 mmHg MR Mean grad:   77.0 mmHg    SHUNTS MR Vmax:        538.00 cm/s  Systemic VTI:  0.15 m MR Vmean:       420.0 cm/s   Systemic Diam: 1.80 cm MR PISA:        1.01 cm MR PISA Radius: 0.40 cm MV E velocity: 110.00 cm/s MV A velocity: 109.00 cm/s MV E/A ratio:  1.01 Armida Lander MD Electronically signed by Armida Lander MD Signature Date/Time: 03/25/2024/12:04:15 PM    Final     Scheduled Meds:  atorvastatin   80 mg Oral Daily   nicotine  14 mg Transdermal Daily    Continuous Infusions:   LOS: 1 day     Joseeduardo Brix J Briselda Naval, MD [facility]  If 7PM-7AM, please contact night-coverage [email or web address] 03/25/2024, 4:46 PM

## 2024-03-25 NOTE — Progress Notes (Signed)
  Echocardiogram 2D Echocardiogram has been performed.  Fain Home RDCS 03/25/2024, 10:03 AM

## 2024-03-25 NOTE — NC FL2 (Signed)
 [place]  MEDICAID FL2 LEVEL OF CARE FORM     IDENTIFICATION  Patient Name: Nicole Clements Birthdate: [birth year] Sex: female Admission Date (Current Location): 03/24/2024  [facility] and [place] Number:  [employer]   Facility and Address:         Provider Number: [id number]  Attending Physician Name and Address:  Veronica Gordon, MD  Relative Name and Phone Number:       Current Level of Care: Hospital Recommended Level of Care: Skilled Nursing Facility Prior Approval Number:    Date Approved/Denied:   PASRR Number: [id number]  Discharge Plan: SNF    Current Diagnoses: Patient Active Problem List   Diagnosis Date Noted   Pneumonia 03/24/2024   ICD (implantable cardioverter-defibrillator) in place 01/24/2020   Diarrhea 12/11/2015   Stroke (HCC) 04/30/2015   Expressive aphasia 04/30/2015   Ventricular tachycardia, non-sustained (HCC) 04/29/2015   Chronic systolic CHF (congestive heart failure), NYHA class 3 (HCC) 04/18/2015   S/P coronary artery stent placement DES-PLA of RCA and occlusion of PDA 07/17/14 07/18/2014   Acute respiratory failure (HCC) 07/12/2014   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   ICM- new LVD 20-25% 07/13/14 02/21/2013   Acute respiratory failure with hypoxia- Bi Pap per EMS 12/31/2012   Acute systolic congestive heart failure (HCC) 12/31/2012   Hx of colonoscopy with polypectomy 07/17/2011   DYSLIPIDEMIA 01/11/2010   TOBACCO USER 01/11/2010   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   CAD- remote MI '90s, distal RCA DES 07/17/14 01/11/2010    Orientation RESPIRATION BLADDER Height & Weight     Self, Time, Situation, Place  O2 (1L) Incontinent Weight: 123 lb (55.8 kg) Height:  5\' 2"  (157.5 cm)  BEHAVIORAL SYMPTOMS/MOOD NEUROLOGICAL BOWEL NUTRITION STATUS      Incontinent Diet (See d/c summary)  AMBULATORY STATUS COMMUNICATION OF NEEDS Skin   Extensive Assist Verbally Skin abrasions, Bruising                       Personal Care Assistance  Level of Assistance  Bathing, Dressing, Feeding Bathing Assistance: Maximum assistance Feeding assistance: Limited assistance Dressing Assistance: Maximum assistance     Functional Limitations Info  Sight, Hearing, Speech Sight Info: Impaired Hearing Info: Adequate Speech Info: Adequate    SPECIAL CARE FACTORS FREQUENCY  PT (By licensed PT)     PT Frequency: 5x weekly              Contractures      Additional Factors Info  Code Status, Allergies Code Status Info: Full Allergies Info: Lactose Intolerance (gi), Sulfa antibiotics           Current Medications (03/25/2024):  This is the current hospital active medication list Current Facility-Administered Medications  Medication Dose Route Frequency Provider Last Rate Last Admin   0.9 %  sodium chloride  infusion (Manually program via Guardrails IV Fluids)   Intravenous Once Ezenduka, Nkeiruka J, MD       acetaminophen  (TYLENOL ) tablet 650 mg  650 mg Oral Q6H PRN Elgergawy, Dawood S, MD       Or   acetaminophen  (TYLENOL ) suppository 650 mg  650 mg Rectal Q6H PRN Elgergawy, Dawood S, MD       albuterol  (PROVENTIL ) (2.5 MG/3ML) 0.083% nebulizer solution 2.5 mg  2.5 mg Nebulization Q2H PRN Elgergawy, Dawood S, MD       atorvastatin  (LIPITOR) tablet 80 mg  80 mg Oral Daily Elgergawy, Dawood S, MD   80 mg at 03/25/24 0846   azithromycin (ZITHROMAX)  500 mg in sodium chloride  0.9 % 250 mL IVPB  500 mg Intravenous Once Elgergawy, Dawood S, MD       cefTRIAXone (ROCEPHIN) 2 g in sodium chloride  0.9 % 100 mL IVPB  2 g Intravenous Once Elgergawy, Dawood S, MD       nicotine (NICODERM CQ - dosed in mg/24 hours) patch 14 mg  14 mg Transdermal Daily Elgergawy, Dawood S, MD   14 mg at 03/25/24 7564     Discharge Medications: Please see discharge summary for a list of discharge medications.  Relevant Imaging Results:  Relevant Lab Results:   Additional Information SSN: [id number].  Ander Katos, LCSW

## 2024-03-26 DIAGNOSIS — D65 Disseminated intravascular coagulation [defibrination syndrome]: Secondary | ICD-10-CM | POA: Diagnosis not present

## 2024-03-26 DIAGNOSIS — A419 Sepsis, unspecified organism: Secondary | ICD-10-CM | POA: Diagnosis not present

## 2024-03-26 DIAGNOSIS — R6520 Severe sepsis without septic shock: Secondary | ICD-10-CM | POA: Diagnosis not present

## 2024-03-26 LAB — URINE CULTURE: Culture: NO GROWTH

## 2024-03-26 LAB — DIFFERENTIAL
Abs Immature Granulocytes: 0.2 10*3/uL — ABNORMAL HIGH (ref 0.00–0.07)
Basophils Absolute: 0 10*3/uL (ref 0.0–0.1)
Basophils Relative: 0 %
Eosinophils Absolute: 0 10*3/uL (ref 0.0–0.5)
Eosinophils Relative: 0 %
Immature Granulocytes: 1 %
Lymphocytes Relative: 9 %
Lymphs Abs: 1.6 10*3/uL (ref 0.7–4.0)
Monocytes Absolute: 1.2 10*3/uL — ABNORMAL HIGH (ref 0.1–1.0)
Monocytes Relative: 7 %
Neutro Abs: 15.6 10*3/uL — ABNORMAL HIGH (ref 1.7–7.7)
Neutrophils Relative %: 83 %

## 2024-03-26 LAB — CBC
HCT: 35.2 % — ABNORMAL LOW (ref 36.0–46.0)
Hemoglobin: 10.4 g/dL — ABNORMAL LOW (ref 12.0–15.0)
MCH: 29 pg (ref 26.0–34.0)
MCHC: 29.5 g/dL — ABNORMAL LOW (ref 30.0–36.0)
MCV: 98.1 fL (ref 80.0–100.0)
Platelets: 35 10*3/uL — ABNORMAL LOW (ref 150–400)
RBC: 3.59 MIL/uL — ABNORMAL LOW (ref 3.87–5.11)
RDW: 17.9 % — ABNORMAL HIGH (ref 11.5–15.5)
WBC: 18.7 10*3/uL — ABNORMAL HIGH (ref 4.0–10.5)
nRBC: 0.2 % (ref 0.0–0.2)

## 2024-03-26 LAB — COMPREHENSIVE METABOLIC PANEL WITH GFR
ALT: 7 U/L (ref 0–44)
AST: 14 U/L — ABNORMAL LOW (ref 15–41)
Albumin: 2.8 g/dL — ABNORMAL LOW (ref 3.5–5.0)
Alkaline Phosphatase: 83 U/L (ref 38–126)
Anion gap: 8 (ref 5–15)
BUN: 35 mg/dL — ABNORMAL HIGH (ref 8–23)
CO2: 24 mmol/L (ref 22–32)
Calcium: 8.1 mg/dL — ABNORMAL LOW (ref 8.9–10.3)
Chloride: 107 mmol/L (ref 98–111)
Creatinine, Ser: 1.61 mg/dL — ABNORMAL HIGH (ref 0.44–1.00)
GFR, Estimated: 32 mL/min — ABNORMAL LOW (ref >60)
Glucose, Bld: 104 mg/dL — ABNORMAL HIGH (ref 70–99)
Potassium: 3.7 mmol/L (ref 3.5–5.1)
Sodium: 139 mmol/L (ref 135–145)
Total Bilirubin: 1.8 mg/dL — ABNORMAL HIGH (ref 0.0–1.2)
Total Protein: 5.7 g/dL — ABNORMAL LOW (ref 6.5–8.1)

## 2024-03-26 LAB — DIC (DISSEMINATED INTRAVASCULAR COAGULATION)PANEL
D-Dimer, Quant: >20 ug{FEU}/mL — ABNORMAL HIGH (ref 0.00–0.50)
Fibrinogen: 139 mg/dL — ABNORMAL LOW (ref 210–475)
INR: 1.6 — ABNORMAL HIGH (ref 0.8–1.2)
Platelets: 37 10*3/uL — ABNORMAL LOW (ref 150–400)
Prothrombin Time: 19.3 s — ABNORMAL HIGH (ref 11.4–15.2)
aPTT: 42 s — ABNORMAL HIGH (ref 24–36)

## 2024-03-26 LAB — MRSA NEXT GEN BY PCR, NASAL: MRSA by PCR Next Gen: NOT DETECTED — AB

## 2024-03-26 MED ORDER — SODIUM CHLORIDE 0.9 % IV SOLN
2.0000 g | INTRAVENOUS | Status: DC
  Administered 2024-03-26 – 2024-03-27 (×2): 2 g via INTRAVENOUS
  Filled 2024-03-26 (×2): qty 12.5

## 2024-03-26 MED ORDER — VANCOMYCIN HCL IN DEXTROSE 1-5 GM/200ML-% IV SOLN
1000.0000 mg | Freq: Once | INTRAVENOUS | Status: AC
  Administered 2024-03-26: 1000 mg via INTRAVENOUS
  Filled 2024-03-26: qty 200

## 2024-03-26 MED ORDER — VANCOMYCIN HCL 750 MG/150ML IV SOLN
750.0000 mg | INTRAVENOUS | Status: DC
Start: 2024-03-28 — End: 2024-03-27

## 2024-03-26 NOTE — Progress Notes (Signed)
 PROGRESS NOTE  Nicole Clements WUJ:[id number] DOB: 1941-10-29 DOA: 03/24/2024 PCP: Glena Landau, MD  HPI/Recap of past 24 hours: Nicole Clements is a 83 y.o. female, with PMH of CAD, ischemic cardiomyopathy, chronic systolic CHF, AICD, HTN, HLD, current tobacco abuse, presents to ED c/o generalized progressive weakness, weight loss, cough, SOB, recent fall, but denied LOC PTA. Pt was hypoxic on presentation, she does not use oxygen at home. In the ED, vital signs showed tachypnea, with soft BP.  Labs showed creatinine 1.66, T. bili 3.6, BNP greater than 2000, CK WNL, lactic acid 2.4--2.1, WBC 14.9, D-dimer greater than 20, fibrinogen less than 60, elevated INR/PT, noted to be in DIC. CTA chest with motion degraded exam, no evidence of PE to the proximal segmental level, noted right upper lobe mass about 5 cm with suspected mediastinal lymphadenopathy and postobstructive changes, noted sclerotic lesion present in the sternum which may represent a focus of bony mets.  Heme/oncology consulted. Patient admitted for further management.    Today, patient denies any new complaints.  No oozing/bleeding noted from any of her orifices.  No bruising noted either.    Assessment/Plan: Principal Problem:   Pneumonia Active Problems:   TOBACCO USER   CAD- remote MI '90s, distal RCA DES 07/17/14   Chronic systolic CHF (congestive heart failure), NYHA class 3 (HCC)   Stroke (HCC)   ICD (implantable cardioverter-defibrillator) in place   DIC (disseminated intravascular coagulation) (HCC)   Lung mass   Severe sepsis likely 2/2 postobstructive pneumonia vs ?UTI Acute respiratory failure with hypoxia On presentation, tachypneic, leukocytosis, elevated lactic acid Currently afebrile, with leukocytosis BC x 2 NGTD Strep pneumo negative, Legionella pending CTA chest with motion degraded exam, no evidence of PE to the proximal segmental level, noted right upper lobe mass about 5 cm with suspected mediastinal  lymphadenopathy and postobstructive changes, noted sclerotic lesion present in the sternum which may represent a focus of bony mets Escalate to vancomycin, cefepime due to ongoing DIC with worsening leukocytosis, plan to de-escalate once improving.  Monitor renal function (pharmacy dosing) Stop azithromycin, ceftriaxone Continue DuoNebs, incentive spirometer, flutter valve Supplemental O2 as needed Monitor very closely  Possible UTI UA showed large leukocytes, hemoglobin, positive nitrites, rare bacteria, greater than 50 WBC UC no growth (culture was taken after antibiotics was initiated)  Hypokalemia Replace as needed   Likely DIC  Noted elevated D-dimers, thrombocytopenia, low fibrinogen, elevated PT/APTT/INR, peripheral smear, with now schistocytes Likely 2/2 sepsis versus malignancy Heme-onc consulted, recommend transfuse cryoprecipitate to keep fibrinogen above 100 to prevent bleeding, avoid FFP given history of heart failure Transfused cryoprecipitate on 03/25/2024 Continue antibiotics for sepsis Monitor closely for any bleeding  Thrombocytopenia Likely 2/2 above Avoid anticoagulation until platelets are greater than 50  Lung mass suspicious for malignancy, 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 Creatinine around baseline Daily CMP  Hyperbilirubinemia CT abdomen/pelvis with no evidence of metastatic disease Possibly from sepsis Vs end-stage heart failure Trend LFTs   Ischemic cardiomyopathy Chronic systolic CHF AICD Does not appear overloaded, although BNP greater than 2000 Denies any chest pain Chest x-ray with cardiomegaly and mild diffuse interstitial opacities possibly due to edema/infection EKG with no acute ST changes Echo showed EF of 20 to 25%, global hypokinesis, previous EF noted to be 25 to 30% in 2022, has AICD in place Hold BP meds due to soft BP in light of sepsis/DIC (hold Coreg , Lasix , losartan , Aldactone ) Telemetry   Hx of CVA/HLD Resume aspirin  when platelets stabilize Hold home statin due to elevated bilirubin  GOC discussion Patient with very poor prognosis overall Palliative consulted     Estimated body mass index is 22.5 kg/m as calculated from the following:   Height as of this encounter: 5\' 2"  (1.575 m).   Weight as of this encounter: 55.8 kg.     Code Status: Full  Family Communication: None at bedside  Disposition Plan: Status is: Inpatient Remains inpatient appropriate because: Level of care      Consultants: Hematology/oncology  Procedures: None  Antimicrobials: Vancomycin Cefepime  DVT prophylaxis: SCDs   Objective: Vitals:   03/26/24 0500 03/26/24 0854 03/26/24 1347 03/26/24 1435  BP: (!) 98/49  (!) 109/56   Pulse: 70  72   Resp:   16   Temp: 98 F (36.7 C)  (!) 97.3 F (36.3 C)   TempSrc: Oral  Oral   SpO2: 91% 90% 90% 90%  Weight:      Height:        Intake/Output Summary (Last 24 hours) at 03/26/2024 1630 Last data filed at 03/26/2024 0900 Gross per 24 hour   Intake 789.83 ml  Output --  Net 789.83 ml   Filed Weights   03/24/24 1228  Weight: 55.8 kg    Exam: General: NAD, very deconditioned Cardiovascular: S1, S2 present Respiratory: CTAB Abdomen: Soft, nontender, nondistended, bowel sounds present Musculoskeletal: No bilateral pedal edema noted Skin: No obvious signs of petechia/bruising noted Psychiatry: Fair mood     Data Reviewed: CBC: Recent Labs  Lab 03/24/24 1252 03/24/24 1640 03/25/24 0418 03/26/24 0412 03/26/24 0454  WBC 14.9*  --  16.6*  --  18.7*  NEUTROABS 11.9*  --  13.8*  --  15.6*  HGB 12.4  --  11.2*  --  10.4*  HCT 40.7  --  37.2  --  35.2*  MCV 95.8  --  98.2  --  98.1  PLT 34* 31* 34* 37* 35*   Basic Metabolic Panel: Recent Labs  Lab 03/24/24 1252 03/25/24 0418 03/26/24 0412  NA 138 140 139  K 3.6 3.3* 3.7  CL 102 107 107  CO2 23 25 24   GLUCOSE 121* 90 104*  BUN 37* 36* 35*  CREATININE 1.66* 1.57* 1.61*  CALCIUM  9.0 8.3* 8.1*  MG  --  2.2  --    GFR: Estimated Creatinine Clearance: 21.7 mL/min (A) (by C-G formula based on SCr of 1.61 mg/dL (H)). Liver Function Tests: Recent Labs  Lab 03/24/24 1252 03/25/24 0418 03/26/24 0412  AST 20 15 14*  ALT 12 10 7   ALKPHOS 100 84 83  BILITOT 3.6* 2.3* 1.8*  PROT 6.5 5.7* 5.7*  ALBUMIN 3.2* 2.8* 2.8*   Recent Labs  Lab 03/24/24 1252  LIPASE 27   No results for input(s): "AMMONIA" in the last 168 hours. Coagulation Profile: Recent Labs  Lab 03/24/24 1640 03/25/24 0418 03/26/24 0412  INR 2.5* 2.2* 1.6*   Cardiac Enzymes: Recent Labs  Lab 03/24/24 1252  CKTOTAL 68   BNP (last 3 results) No results for input(s): "PROBNP" in the last 8760 hours. HbA1C: No results for input(s): "HGBA1C" in the last 72 hours. CBG: No results for input(s): "GLUCAP" in the last 168 hours. Lipid Profile: No results for input(s): "CHOL", "HDL", "LDLCALC", "TRIG", "CHOLHDL", "LDLDIRECT" in the last 72 hours. Thyroid  Function Tests: No results for  input(s): "TSH", "T4TOTAL", "FREET4", "T3FREE", "THYROIDAB" in the last 72 hours. Anemia Panel: No results for input(s): "VITAMINB12", "FOLATE", "FERRITIN", "TIBC", "IRON", "RETICCTPCT" in the last 72 hours. Urine analysis:    Component Value Date/Time   COLORURINE YELLOW 03/24/2024 1815   APPEARANCEUR HAZY (A) 03/24/2024 1815   LABSPEC 1.020 03/24/2024 1815   PHURINE 5.0 03/24/2024 1815   GLUCOSEU NEGATIVE 03/24/2024 1815   HGBUR LARGE (A) 03/24/2024 1815   BILIRUBINUR NEGATIVE 03/24/2024 1815   KETONESUR NEGATIVE 03/24/2024 1815   PROTEINUR 30 (A) 03/24/2024 1815   UROBILINOGEN 0.2 04/29/2015 1746   NITRITE POSITIVE (A) 03/24/2024 1815   LEUKOCYTESUR LARGE (A) 03/24/2024 1815   Sepsis Labs: @LABRCNTIP (procalcitonin:4,lacticidven:4)  ) Recent Results (from the past 240 hours)  Culture, blood (routine x 2)     Status: None (Preliminary result)   Collection Time: 03/24/24 12:52 PM   Specimen: BLOOD  Result Value Ref Range Status   Specimen Description BLOOD LEFT ANTECUBITAL  Final   Special Requests   Final    BOTTLES DRAWN AEROBIC AND ANAEROBIC Blood Culture adequate volume   Culture   Final    NO GROWTH 2 DAYS Performed at [facility], [address] [place], [place] [address]    Report Status PENDING  Incomplete  Resp panel by RT-PCR (RSV, Flu A&B, Covid) Anterior Nasal Swab     Status: None   Collection Time: 03/24/24  1:20 PM   Specimen: Anterior Nasal Swab  Result Value Ref Range Status   SARS Coronavirus 2 by RT PCR NEGATIVE NEGATIVE Final    Comment: (NOTE) SARS-CoV-2 target nucleic acids are NOT DETECTED.  The SARS-CoV-2 RNA is generally detectable in upper respiratory specimens during the acute phase of infection. The lowest concentration of SARS-CoV-2 viral copies this assay can detect is 138 copies/mL. A negative result does not preclude SARS-Cov-2 infection and should not be used as the sole basis for treatment or other patient management decisions. A  negative result may occur with  improper specimen collection/handling, submission of specimen other than nasopharyngeal swab, presence of viral mutation(s) within the areas targeted by this assay, and inadequate number of viral copies(<138 copies/mL). A negative result must be combined with clinical observations, patient history, and epidemiological information. The expected result is Negative.  Fact Sheet for Patients:  BloggerCourse.com  Fact Sheet for Healthcare Providers:  SeriousBroker.it  This test is no t yet approved or cleared by the [place]  FDA and  has been authorized for detection and/or diagnosis of SARS-CoV-2 by FDA under an Emergency Use Authorization (EUA). This EUA will remain  in effect (meaning this test can be used) for the duration of the COVID-19 declaration under Section 564(b)(1) of the Act, 21 U.S.C.section 360bbb-3(b)(1), unless the authorization is terminated  or revoked sooner.  Influenza A by PCR NEGATIVE NEGATIVE Final   Influenza B by PCR NEGATIVE NEGATIVE Final    Comment: (NOTE) The Xpert Xpress SARS-CoV-2/FLU/RSV plus assay is intended as an aid in the diagnosis of influenza from Nasopharyngeal swab specimens and should not be used as a sole basis for treatment. Nasal washings and aspirates are unacceptable for Xpert Xpress SARS-CoV-2/FLU/RSV testing.  Fact Sheet for Patients: BloggerCourse.com  Fact Sheet for Healthcare Providers: SeriousBroker.it  This test is not yet approved or cleared by the [place]  FDA and has been authorized for detection and/or diagnosis of SARS-CoV-2 by FDA under an Emergency Use Authorization (EUA). This EUA will remain in effect (meaning this test can be used) for the duration of the COVID-19 declaration under Section 564(b)(1) of the Act, 21 U.S.C. section 360bbb-3(b)(1), unless the authorization  is terminated or revoked.     Resp Syncytial Virus by PCR NEGATIVE NEGATIVE Final    Comment: (NOTE) Fact Sheet for Patients: BloggerCourse.com  Fact Sheet for Healthcare Providers: SeriousBroker.it  This test is not yet approved or cleared by the [place]  FDA and has been authorized for detection and/or diagnosis of SARS-CoV-2 by FDA under an Emergency Use Authorization (EUA). This EUA will remain in effect (meaning this test can be used) for the duration of the COVID-19 declaration under Section 564(b)(1) of the Act, 21 U.S.C. section 360bbb-3(b)(1), unless the authorization is terminated or revoked.  Performed at [facility], [address] [place], [place] [address]   Culture, blood (routine x 2)     Status: None (Preliminary result)   Collection Time: 03/24/24  2:33 PM   Specimen: BLOOD  Result Value Ref Range Status   Specimen Description BLOOD BLOOD LEFT ARM  Final   Special Requests   Final    BOTTLES DRAWN AEROBIC ONLY Blood Culture results may not be optimal due to an inadequate volume of blood received in culture bottles   Culture   Final    NO GROWTH 2 DAYS Performed at [facility], [address] [place], [place] [address]    Report Status PENDING  Incomplete  Urine Culture (for pregnant, neutropenic or urologic patients or patients with an indwelling urinary catheter)     Status: None   Collection Time: 03/25/24  9:00 AM   Specimen: Urine, Clean Catch  Result Value Ref Range Status   Specimen Description   Final    URINE, CLEAN CATCH Performed at [facility], [address], [place], [place] [address]    Special Requests   Final    NONE Performed at [facility], [address] [place], [place] [address]    Culture   Final    NO GROWTH Performed at [facility], [address] [place], [place] [address]    Report Status 03/26/2024 FINAL  Final      Studies: No results  found.   Scheduled Meds:  ipratropium-albuterol   3 mL Nebulization Q6H   nicotine  14 mg Transdermal Daily    Continuous Infusions:  ceFEPime (MAXIPIME) IV 2 g (03/26/24 1009)   [START ON 03/28/2024] vancomycin       LOS: 2 days     Veronica Gordon, MD [facility]  If 7PM-7AM, please contact night-coverage [email or web address] 03/26/2024, 4:30 PM

## 2024-03-26 NOTE — Progress Notes (Signed)
 7829  Patient eating breakfast, unavailable for nebulizer at this time.

## 2024-03-26 NOTE — Plan of Care (Signed)

## 2024-03-26 NOTE — Progress Notes (Signed)
 Pharmacy Antibiotic Note  Nicole Clements is a 83 y.o. female admitted on 03/24/2024 with sepsis.  Pharmacy has been consulted for Vancomycin and cefepime dosing.  Plan: Vancomycin 1000 mg IV loading dose, then 750mg  IV Q 48 hrs. Goal AUC 400-550. Expected AUC: 430 SCr used: 1.61 Cefepime 2gm IV  q24h F/U cxs and clinical progress Monitor V/S, labs and levels as indicated  Height: 5\' 2"  (157.5 cm) Weight: 55.8 kg (123 lb) IBW/kg (Calculated) : 50.1  Temp (24hrs), Avg:97.8 F (36.6 C), Min:97.5 F (36.4 C), Max:98 F (36.7 C)  Recent Labs  Lab 03/24/24 1252 03/24/24 1433 03/25/24 0418 03/26/24 0412 03/26/24 0454  WBC 14.9*  --  16.6*  --  18.7*  CREATININE 1.66*  --  1.57* 1.61*  --   LATICACIDVEN 2.4* 2.1*  --   --   --     Estimated Creatinine Clearance: 21.7 mL/min (A) (by C-G formula based on SCr of 1.61 mg/dL (H)).    Allergies  Allergen Reactions   Lactose Intolerance (Gi) Other (See Comments)    GI Upset    Sulfa Antibiotics Rash    Antimicrobials this admission: Vancomycin 5/24 >>  cefepime 5/24 >>  Azithromycin 5/22> 5/24 Ceftriaxone 5/22> 5/24  Microbiology results: 5/22 BCx: ngtd 5/23 UCx: pending   Thank you for allowing pharmacy to be a part of this patient's care.  Valdez Brannan, BS Pharm D, BCPS Clinical Pharmacist 03/26/2024 9:03 AM

## 2024-03-26 NOTE — Plan of Care (Signed)
   Problem: Activity: Goal: Risk for activity intolerance will decrease Outcome: Progressing   Problem: Coping: Goal: Level of anxiety will decrease Outcome: Progressing   Problem: Safety: Goal: Ability to remain free from injury will improve Outcome: Progressing

## 2024-03-27 ENCOUNTER — Ambulatory Visit: Payer: Self-pay | Admitting: Internal Medicine

## 2024-03-27 ENCOUNTER — Other Ambulatory Visit: Payer: Self-pay

## 2024-03-27 DIAGNOSIS — D65 Disseminated intravascular coagulation [defibrination syndrome]: Secondary | ICD-10-CM | POA: Diagnosis not present

## 2024-03-27 DIAGNOSIS — R6520 Severe sepsis without septic shock: Secondary | ICD-10-CM | POA: Diagnosis not present

## 2024-03-27 DIAGNOSIS — A419 Sepsis, unspecified organism: Secondary | ICD-10-CM | POA: Diagnosis not present

## 2024-03-27 LAB — DIC (DISSEMINATED INTRAVASCULAR COAGULATION)PANEL
D-Dimer, Quant: >20 ug{FEU}/mL — ABNORMAL HIGH (ref 0.00–0.50)
Fibrinogen: 71 mg/dL — CL (ref 210–475)
INR: 1.9 — ABNORMAL HIGH (ref 0.8–1.2)
Platelets: 28 10*3/uL — CL (ref 150–400)
Prothrombin Time: 22.1 s — ABNORMAL HIGH (ref 11.4–15.2)
Smear Review: NONE SEEN
aPTT: 46 s — ABNORMAL HIGH (ref 24–36)

## 2024-03-27 LAB — COMPREHENSIVE METABOLIC PANEL WITH GFR
ALT: 18 U/L (ref 0–44)
AST: 40 U/L (ref 15–41)
Albumin: 2.8 g/dL — ABNORMAL LOW (ref 3.5–5.0)
Alkaline Phosphatase: 89 U/L (ref 38–126)
Anion gap: 18 — ABNORMAL HIGH (ref 5–15)
BUN: 38 mg/dL — ABNORMAL HIGH (ref 8–23)
CO2: 21 mmol/L — ABNORMAL LOW (ref 22–32)
Calcium: 8.6 mg/dL — ABNORMAL LOW (ref 8.9–10.3)
Chloride: 105 mmol/L (ref 98–111)
Creatinine, Ser: 1.67 mg/dL — ABNORMAL HIGH (ref 0.44–1.00)
GFR, Estimated: 31 mL/min — ABNORMAL LOW (ref >60)
Glucose, Bld: 111 mg/dL — ABNORMAL HIGH (ref 70–99)
Potassium: 4 mmol/L (ref 3.5–5.1)
Sodium: 144 mmol/L (ref 135–145)
Total Bilirubin: 1.7 mg/dL — ABNORMAL HIGH (ref 0.0–1.2)
Total Protein: 5.9 g/dL — ABNORMAL LOW (ref 6.5–8.1)

## 2024-03-27 LAB — GLUCOSE, CAPILLARY: Glucose-Capillary: 124 mg/dL — ABNORMAL HIGH (ref 70–99)

## 2024-03-27 LAB — LEGIONELLA PNEUMOPHILA SEROGP 1 UR AG: L. pneumophila Serogp 1 Ur Ag: NEGATIVE

## 2024-03-27 LAB — HEPATITIS PANEL, ACUTE
HCV Ab: NONREACTIVE
Hep A IgM: NONREACTIVE
Hep B C IgM: NONREACTIVE
Hepatitis B Surface Ag: NONREACTIVE

## 2024-03-27 MED ORDER — IPRATROPIUM-ALBUTEROL 0.5-2.5 (3) MG/3ML IN SOLN
3.0000 mL | Freq: Four times a day (QID) | RESPIRATORY_TRACT | Status: DC
  Administered 2024-03-27: 3 mL via RESPIRATORY_TRACT
  Filled 2024-03-27: qty 3

## 2024-03-27 MED ORDER — PROPOFOL 1000 MG/100ML IV EMUL
INTRAVENOUS | Status: AC
  Filled 2024-03-27: qty 100

## 2024-03-27 MED ORDER — SODIUM CHLORIDE 0.9% IV SOLUTION: Freq: Once | INTRAVENOUS | Status: AC

## 2024-03-27 MED ORDER — FENTANYL CITRATE (PF) 100 MCG/2ML IJ SOLN
INTRAMUSCULAR | Status: DC
Start: 2024-03-27 — End: 2024-03-27
  Filled 2024-03-27: qty 2

## 2024-03-27 MED ORDER — CHLORHEXIDINE GLUCONATE CLOTH 2 % EX PADS: 6.0000 | MEDICATED_PAD | Freq: Every day | CUTANEOUS | Status: DC

## 2024-03-28 LAB — BPAM CRYOPRECIPITATE
Blood Product Expiration Date: 202505231837
Blood Product Expiration Date: 202505251901
ISSUE DATE / TIME: 202505231544
Unit Type and Rh: 5100
Unit Type and Rh: 5100

## 2024-03-28 LAB — PREPARE CRYOPRECIPITATE
Unit division: 0
Unit division: 0

## 2024-03-29 LAB — CULTURE, BLOOD (ROUTINE X 2)
Culture: NO GROWTH
Culture: NO GROWTH
Special Requests: ADEQUATE

## 2024-03-30 MED FILL — Medication: Qty: 1 | Status: AC

## 2024-04-03 NOTE — Progress Notes (Signed)
 Chaplain engaged in an initial visit with Finn's daughter and niece at bedside. Family engaged in narrative life review and storytelling, sharing about the strength of Danyia as their Education administrator. Chaplain provided space for them to share significant events and emotions. Chaplain offered prayer over Neytiri with family surrounding her and provided them with next steps before leaving the hospital. They do have funeral information that they will provide to the nurse.   Chaplain provided reflective listening, compassionate presence, encouragement, communal support, and the practice of prayer.   Glo Larch, [facility]  03/16/2024 1400  Spiritual Encounters  Type of Visit Initial  Care provided to: Family  Referral source Family  Reason for visit Patient death  OnCall Visit Yes  Spiritual Framework  Presenting Themes Community and relationships;Rituals and practive;Impactful experiences and emotions  Values/beliefs Prayer oriented, Christian  Community/Connection Family  Strengths Faith, knowledge, family  Interventions  Spiritual Care Interventions Made Established relationship of care and support;Compassionate presence;Reflective listening;Narrative/life review;Prayer;Encouragement;Supported grief process  Intervention Outcomes  Outcomes Awareness of support;Connection to spiritual care

## 2024-04-03 NOTE — Progress Notes (Signed)
 PROGRESS NOTE  Nicole Clements UJW:[id number] DOB: [birth year] DOA: 03/24/2024 PCP: Nicole Landau, MD  HPI/Recap of past 24 hours: Nicole Clements is a 83 y.o. female, with PMH of CAD, ischemic cardiomyopathy, chronic systolic CHF, AICD, HTN, HLD, current tobacco abuse, presents to ED c/o generalized progressive weakness, weight loss, cough, SOB, recent fall, but denied LOC PTA. Pt was hypoxic on presentation, she does not use oxygen at home. In the ED, vital signs showed tachypnea, with soft BP.  Labs showed creatinine 1.66, T. bili 3.6, BNP greater than 2000, CK WNL, lactic acid 2.4--2.1, WBC 14.9, D-dimer greater than 20, fibrinogen less than 60, elevated INR/PT, noted to be in DIC. CTA chest with motion degraded exam, no evidence of PE to the proximal segmental level, noted right upper lobe mass about 5 cm with suspected mediastinal lymphadenopathy and postobstructive changes, noted sclerotic lesion present in the sternum which may represent a focus of bony mets.  Heme/oncology consulted. Patient admitted for further management.    Today, patient noted to be sleeping, easily arousable.  Denied any new complaints.  Reported she just wants to sleep.  Oriented.  Discussed extensively with patient's sister about overall very poor prognosis, wants patient to be full code   Assessment/Plan: Principal Problem:   Pneumonia Active Problems:   TOBACCO USER   CAD- remote MI '90s, distal RCA DES 07/17/14   Chronic systolic CHF (congestive heart failure), NYHA class 3 (HCC)   Stroke (HCC)   ICD (implantable cardioverter-defibrillator) in place   DIC (disseminated intravascular coagulation) (HCC)   Lung mass   Severe sepsis likely 2/2 postobstructive pneumonia vs ?UTI Acute respiratory failure with hypoxia On presentation, tachypneic, leukocytosis, elevated lactic acid Currently afebrile, with leukocytosis BC x 2 NGTD Strep pneumo negative, Legionella pending CTA chest with motion degraded exam, no  evidence of PE to the proximal segmental level, noted right upper lobe mass about 5 cm with suspected mediastinal lymphadenopathy and postobstructive changes, noted sclerotic lesion present in the sternum which may represent a focus of bony mets Escalate to vancomycin, cefepime due to ongoing DIC with worsening leukocytosis, plan to de-escalate once improving.  Monitor renal function (pharmacy dosing) Stop azithromycin, ceftriaxone Continue DuoNebs, incentive spirometer, flutter valve Supplemental O2 as needed Monitor very closely  Possible UTI UA showed large leukocytes, hemoglobin, positive nitrites, rare bacteria, greater than 50 WBC UC no growth (culture was taken after antibiotics was initiated)  Hypokalemia Replace as needed   Likely DIC  Noted elevated D-dimers, thrombocytopenia, low fibrinogen, elevated PT/APTT/INR, peripheral smear, with now schistocytes Likely 2/2 sepsis versus malignancy Heme-onc consulted, recommend transfuse cryoprecipitate to keep fibrinogen above 100 to prevent bleeding, avoid FFP given history of heart failure Transfused cryoprecipitate on 03/25/2024, will transfuse another on 03/14/2024 Continue antibiotics for sepsis Monitor closely for any bleeding  Thrombocytopenia Likely 2/2 above Further ongoing work up per Hem/onc, appreciate recs Avoid anticoagulation until platelets are greater than 50  Lung mass suspicious for malignancy, 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 Creatinine around baseline Daily CMP  Hyperbilirubinemia CT abdomen/pelvis with no evidence of metastatic disease Possibly from sepsis Vs end-stage heart failure Trend LFTs   Ischemic cardiomyopathy Chronic systolic CHF AICD Does not appear overloaded, although BNP greater than 2000 Denies any chest pain Chest x-ray with cardiomegaly and mild diffuse interstitial opacities possibly due to edema/infection EKG with no acute ST changes Echo showed EF of 20 to 25%, global hypokinesis, previous EF noted to be 25 to 30% in 2022, has AICD in place Hold BP meds due to soft BP in light of sepsis/DIC (hold Coreg , Lasix , losartan , Aldactone ) Telemetry   Hx of CVA/HLD Resume aspirin  when platelets stabilize Hold home statin due to elevated bilirubin  GOC discussion Patient with very poor prognosis overall Discussed extensively with patient's sister about overall very poor prognosis, wants patient to be full code on 03/14/2024 Palliative consulted     Estimated body mass index is 22.5 kg/m as calculated from the following:   Height as of this encounter: 5\' 2"  (1.575 m).   Weight as of this encounter: 55.8 kg.     Code Status: Full  Family Communication: Discussed with sister over the phone on 03/25/2024  Disposition Plan: Status is: Inpatient Remains inpatient appropriate because: Level of care      Consultants: Hematology/oncology  Procedures: None  Antimicrobials: Vancomycin Cefepime  DVT prophylaxis: SCDs   Objective: Vitals:    03/26/24 1435 03/26/24 2011 03/26/24 2021 03/04/2024 0544  BP:   123/62 116/67  Pulse:   71 73  Resp:   (!) 24 18  Temp:   97.7 F (36.5 C) (!) 97.5 F (36.4 C)  TempSrc:   Oral Oral  SpO2: 90% 93% 97% 95%  Weight:      Height:        Intake/Output Summary (Last 24 hours) at 03/09/2024 1151 Last data filed at 03/28/2024 0544 Gross per 24 hour  Intake 340 ml  Output --  Net 340 ml   Filed Weights   03/24/24 1228  Weight: 55.8 kg    Exam: General: NAD, very deconditioned Cardiovascular: S1, S2 present Respiratory: Basilar crackles, diminished breath sounds bilaterally Abdomen: Soft, nontender, nondistended, bowel sounds present Musculoskeletal: No bilateral pedal edema noted Skin: No obvious signs of petechia/bruising noted Psychiatry: Poor mood     Data Reviewed: CBC: Recent Labs  Lab 03/24/24 1252 03/24/24 1640 03/25/24 0418 03/26/24 0412 03/26/24 0454 03/08/2024 0435  WBC 14.9*  --  16.6*  --  18.7*  --   NEUTROABS 11.9*  --  13.8*  --  15.6*  --   HGB 12.4  --  11.2*  --  10.4*  --   HCT 40.7  --  37.2  --  35.2*  --   MCV 95.8  --  98.2  --  98.1  --   PLT 34* 31* 34* 37* 35* 28*   Basic Metabolic Panel: Recent Labs  Lab 03/24/24 1252 03/25/24 0418 03/26/24 0412 03/05/2024 0432  NA 138 140 139 PENDING  K 3.6 3.3* 3.7 4.0  CL 102 107 107 105  CO2 23 25 24  21*  GLUCOSE 121* 90  104* 111*  BUN 37* 36* 35* 38*  CREATININE 1.66* 1.57* 1.61* 1.67*  CALCIUM  9.0 8.3* 8.1* 8.6*  MG  --  2.2  --   --    GFR: Estimated Creatinine Clearance: 20.9 mL/min (A) (by C-G formula based on SCr of 1.67 mg/dL (H)). Liver Function Tests: Recent Labs  Lab 03/24/24 1252 03/25/24 0418 03/26/24 0412 03/03/2024 0432  AST 20 15 14* 40  ALT 12 10 7 18   ALKPHOS 100 84 83 89  BILITOT 3.6* 2.3* 1.8* 1.7*  PROT 6.5 5.7* 5.7* 5.9*  ALBUMIN 3.2* 2.8* 2.8* 2.8*   Recent Labs  Lab 03/24/24 1252  LIPASE 27   No results for input(s): "AMMONIA" in the last 168  hours. Coagulation Profile: Recent Labs  Lab 03/24/24 1640 03/25/24 0418 03/26/24 0412 03/06/2024 0435  INR 2.5* 2.2* 1.6* 1.9*   Cardiac Enzymes: Recent Labs  Lab 03/24/24 1252  CKTOTAL 68   BNP (last 3 results) No results for input(s): "PROBNP" in the last 8760 hours. HbA1C: No results for input(s): "HGBA1C" in the last 72 hours. CBG: No results for input(s): "GLUCAP" in the last 168 hours. Lipid Profile: No results for input(s): "CHOL", "HDL", "LDLCALC", "TRIG", "CHOLHDL", "LDLDIRECT" in the last 72 hours. Thyroid  Function Tests: No results for input(s): "TSH", "T4TOTAL", "FREET4", "T3FREE", "THYROIDAB" in the last 72 hours. Anemia Panel: No results for input(s): "VITAMINB12", "FOLATE", "FERRITIN", "TIBC", "IRON", "RETICCTPCT" in the last 72 hours. Urine analysis:    Component Value Date/Time   COLORURINE YELLOW 03/24/2024 1815   APPEARANCEUR HAZY (A) 03/24/2024 1815   LABSPEC 1.020 03/24/2024 1815   PHURINE 5.0 03/24/2024 1815   GLUCOSEU NEGATIVE 03/24/2024 1815   HGBUR LARGE (A) 03/24/2024 1815   BILIRUBINUR NEGATIVE 03/24/2024 1815   KETONESUR NEGATIVE 03/24/2024 1815   PROTEINUR 30 (A) 03/24/2024 1815   UROBILINOGEN 0.2 04/29/2015 1746   NITRITE POSITIVE (A) 03/24/2024 1815   LEUKOCYTESUR LARGE (A) 03/24/2024 1815   Sepsis Labs: @LABRCNTIP (procalcitonin:4,lacticidven:4)  ) Recent Results (from the past 240 hours)  Culture, blood (routine x 2)     Status: None (Preliminary result)   Collection Time: 03/24/24 12:52 PM   Specimen: BLOOD  Result Value Ref Range Status   Specimen Description BLOOD LEFT ANTECUBITAL  Final   Special Requests   Final    BOTTLES DRAWN AEROBIC AND ANAEROBIC Blood Culture adequate volume   Culture   Final    NO GROWTH 3 DAYS Performed at [facility], [address] [place], [place] [address]    Report Status PENDING  Incomplete  Resp panel by RT-PCR (RSV, Flu A&B, Covid) Anterior Nasal Swab     Status: None   Collection Time:  03/24/24  1:20 PM   Specimen: Anterior Nasal Swab  Result Value Ref Range Status   SARS Coronavirus 2 by RT PCR NEGATIVE NEGATIVE Final    Comment: (NOTE) SARS-CoV-2 target nucleic acids are NOT DETECTED.  The SARS-CoV-2 RNA is generally detectable in upper respiratory specimens during the acute phase of infection. The lowest concentration of SARS-CoV-2 viral copies this assay can detect is 138 copies/mL. A negative result does not preclude SARS-Cov-2 infection and should not be used as the sole basis for treatment or other patient management decisions. A negative result may occur with  improper specimen collection/handling, submission of specimen other than nasopharyngeal swab, presence of viral mutation(s) within the areas targeted by this assay, and inadequate number of viral copies(<138 copies/mL). A negative result must be combined with clinical observations, patient  history, and epidemiological information. The expected result is Negative.  Fact Sheet for Patients:  BloggerCourse.com  Fact Sheet for Healthcare Providers:  SeriousBroker.it  This test is no t yet approved or cleared by the [place]  FDA and  has been authorized for detection and/or diagnosis of SARS-CoV-2 by FDA under an Emergency Use Authorization (EUA). This EUA will remain  in effect (meaning this test can be used) for the duration of the COVID-19 declaration under Section 564(b)(1) of the Act, 21 U.S.C.section 360bbb-3(b)(1), unless the authorization is terminated  or revoked sooner.       Influenza A by PCR NEGATIVE NEGATIVE Final   Influenza B by PCR NEGATIVE NEGATIVE Final    Comment: (NOTE) The Xpert Xpress SARS-CoV-2/FLU/RSV plus assay is intended as an aid in the diagnosis of influenza from Nasopharyngeal swab specimens and should not be used as a sole basis for treatment. Nasal washings and aspirates are unacceptable for Xpert Xpress  SARS-CoV-2/FLU/RSV testing.  Fact Sheet for Patients: BloggerCourse.com  Fact Sheet for Healthcare Providers: SeriousBroker.it  This test is not yet approved or cleared by the [place]  FDA and has been authorized for detection and/or diagnosis of SARS-CoV-2 by FDA under an Emergency Use Authorization (EUA). This EUA will remain in effect (meaning this test can be used) for the duration of the COVID-19 declaration under Section 564(b)(1) of the Act, 21 U.S.C. section 360bbb-3(b)(1), unless the authorization is terminated or revoked.     Resp Syncytial Virus by PCR NEGATIVE NEGATIVE Final    Comment: (NOTE) Fact Sheet for Patients: BloggerCourse.com  Fact Sheet for Healthcare Providers: SeriousBroker.it  This test is not yet approved or cleared by the [place]  FDA and has been authorized for detection and/or diagnosis of SARS-CoV-2 by FDA under an Emergency Use Authorization (EUA). This EUA will remain in effect (meaning this test can be used) for the duration of the COVID-19 declaration under Section 564(b)(1) of the Act, 21 U.S.C. section 360bbb-3(b)(1), unless the authorization is terminated or revoked.  Performed at [facility], [address], [place], [place] [address]   Culture, blood (routine x 2)     Status: None (Preliminary result)   Collection Time: 03/24/24  2:33 PM   Specimen: BLOOD  Result Value Ref Range Status   Specimen Description BLOOD BLOOD LEFT ARM  Final   Special Requests   Final    BOTTLES DRAWN AEROBIC ONLY Blood Culture results may not be optimal due to an inadequate volume of blood received in culture bottles   Culture   Final    NO GROWTH 3 DAYS Performed at [facility], [address], [place], [place] [address]    Report Status PENDING  Incomplete  Urine Culture (for pregnant, neutropenic or urologic patients or patients with an  indwelling urinary catheter)     Status: None   Collection Time: 03/25/24  9:00 AM   Specimen: Urine, Clean Catch  Result Value Ref Range Status   Specimen Description   Final    URINE, CLEAN CATCH Performed at [facility], [address] [place], [place] [address]    Special Requests   Final    NONE Performed at [facility], [address] [place], [place] [address]    Culture   Final    NO GROWTH Performed at [facility], [address] [place], [place] [address]    Report Status 03/26/2024 FINAL  Final  MRSA Next Gen by PCR, Nasal     Status: Abnormal  Collection Time: 03/26/24  9:16 AM   Specimen: Nasal Mucosa; Nasal Swab  Result Value Ref Range Status   MRSA by PCR Next Gen Not Detected (A) NOT DETECTED Final    Comment:        The GeneXpert MRSA Assay (FDA approved for NASAL specimens only), is one component of a comprehensive MRSA colonization surveillance program. It is not intended to diagnose MRSA infection nor to guide or monitor treatment for MRSA infections. Performed at [facility], [address] [place], [place] [address]       Studies: No results found.   Scheduled Meds:  ipratropium-albuterol   3 mL Nebulization Q6H WA   nicotine  14 mg Transdermal Daily    Continuous Infusions:  ceFEPime (MAXIPIME) IV 2 g (03/12/2024 0833)   [START ON 03/28/2024] vancomycin       LOS: 3 days     Veronica Gordon, MD [facility]  If 7PM-7AM, please contact night-coverage [email or web address] 03/29/2024, 11:51 AM

## 2024-04-03 NOTE — Progress Notes (Addendum)
 Date and time results received: 03/05/2024 0758   Test: Platelets 28 Fibrinogen 71   Critical Value: platelets 28 Fibrinogen 71  Name of Provider Notified: Janas Meadows, MD  Awaiting new orders

## 2024-04-03 NOTE — ED Provider Notes (Signed)
 Department of Emergency Medicine CODE BLUE CONSULTATION    Code Blue CONSULT NOTE  Chief Complaint: Cardiac arrest/unresponsive   Level V Caveat: Unresponsive  History of present illness: I was contacted by the hospital for a CODE BLUE cardiac arrest upstairs and presented to the patient's bedside.  Patient is unresponsive, not breathing, no respiratory effort, no pulses, in ventricular tachycardia on the monitor, the patient has a visible pacemaker or defibrillator in the left upper chest but it does not shocking, I directed CPR, hospitalist Dr. Lincoln Renshaw at the bedside directing the code requesting that I intubate  ROS: Unable to obtain, Level V caveat  Scheduled Meds:  [START ON 03/28/2024] Chlorhexidine  Gluconate Cloth  6 each Topical Q0600   fentaNYL        ipratropium-albuterol   3 mL Nebulization Q6H WA   nicotine  14 mg Transdermal Daily   Continuous Infusions:  ceFEPime (MAXIPIME) IV 2 g (03/18/2024 0833)   propofol     [START ON 03/28/2024] vancomycin     PRN Meds:.acetaminophen  **OR** acetaminophen , albuterol , fentaNYL , propofol Past Medical History:  Diagnosis Date   AICD (automatic cardioverter/defibrillator) present    CAD (coronary artery disease) April 2014   DES to PLA, occluded PDA 07/2014   CHF (congestive heart failure) (HCC)    Hypercholesterolemia    Hypertension    Ischemic cardiomyopathy    LVEF 25%-45%   IVCD (intraventricular conduction defect)    PVC's (premature ventricular contractions)    S/P colonoscopy August 2007   Hyperplastic polyps, rare sigmoid and descending colon diverticulosis, small internal hemorrhoids   STEMI (ST elevation myocardial infarction) (HCC) 07/22/1996   Past Surgical History:  Procedure Laterality Date   ABDOMINAL HYSTERECTOMY     BIV ICD GENERATOR CHANGEOUT N/A 12/22/2022   Procedure: BIV ICD GENERATOR CHANGEOUT;  Surgeon: Tammie Fall, MD;  Location: [facility] INVASIVE CV LAB;  Service: Cardiovascular;  Laterality: N/A;    BREAST CYST EXCISION Left    unsure when   CARDIAC CATHETERIZATION  02/2013   Med Rx   CATARACT EXTRACTION W/PHACO Right 09/15/2017   Procedure: CATARACT EXTRACTION PHACO AND INTRAOCULAR LENS PLACEMENT (IOC);  Surgeon: Albert Huff, MD;  Location: [facility];  Service: Ophthalmology;  Laterality: Right;  CDE: 12.22   CATARACT EXTRACTION W/PHACO Left 09/29/2017   Procedure: CATARACT EXTRACTION PHACO AND INTRAOCULAR LENS PLACEMENT (IOC);  Surgeon: Albert Huff, MD;  Location: [facility];  Service: Ophthalmology;  Laterality: Left;  CDE: 10.02   COLONOSCOPY N/A 12/24/2015   Dr. Fields:moderate sized internal hemorrhoids/moderate diverticulosis, negative microscopic colitis    EP IMPLANTABLE DEVICE  04/18/2015   BV ICD   EP IMPLANTABLE DEVICE N/A 04/18/2015   Procedure: BiV ICD Insertion CRT-D;  Surgeon: Verona Goodwill, MD;  Location: [facility] INVASIVE CV LAB;  Service: Cardiovascular;  Laterality: N/A;   LEFT AND RIGHT HEART CATHETERIZATION WITH CORONARY ANGIOGRAM N/A 07/17/2014   Procedure: LEFT AND RIGHT HEART CATHETERIZATION WITH CORONARY ANGIOGRAM;  Surgeon: Arlander Bellman, MD;  Location: [facility] CATH LAB;  Service: Cardiovascular;  Laterality: N/A;   PERCUTANEOUS CORONARY STENT INTERVENTION (PCI-S)  07/17/2014   Procedure: PERCUTANEOUS CORONARY STENT INTERVENTION (PCI-S);  Surgeon: Arlander Bellman, MD;  Location: [facility] CATH LAB;  Service: Cardiovascular;;  Distal RCA   S/P Hysterectomy     Social History   Socioeconomic History   Marital status: Single    Spouse name: Not on file   Number of children: 1   Years of education: Not on file   Highest education  level: Not on file  Occupational History   Occupation: Retired    Comment: Clinical biochemist rep  Tobacco Use   Smoking status: Every Day    Current packs/day: 0.50    Average packs/day: 0.5 packs/day for 62.1 years (31.0 ttl pk-yrs)    Types: Cigarettes    Start date: 02/26/1962   Smokeless tobacco: Never   Tobacco comments:    down to  less than 1/2 ppd  Vaping Use   Vaping status: Never Used  Substance and Sexual Activity   Alcohol use: No    Alcohol/week: 0.0 standard drinks of alcohol   Drug use: No   Sexual activity: Never    Birth control/protection: None  Other Topics Concern   Not on file  Social History Narrative   Not on file   Social Drivers of Health   Financial Resource Strain: Not on file  Food Insecurity: No Food Insecurity (03/24/2024)   Hunger Vital Sign    Worried About Running Out of Food in the Last Year: Never true    Ran Out of Food in the Last Year: Never true  Transportation Needs: No Transportation Needs (03/24/2024)   PRAPARE - Administrator, Civil Service (Medical): No    Lack of Transportation (Non-Medical): No  Physical Activity: Not on file  Stress: Not on file  Social Connections: Socially Isolated (03/24/2024)   Social Connection and Isolation Panel [NHANES]    Frequency of Communication with Friends and Family: More than three times a week    Frequency of Social Gatherings with Friends and Family: More than three times a week    Attends Religious Services: Never    Database administrator or Organizations: No    Attends Banker Meetings: Never    Marital Status: Never married  Intimate Partner Violence: Not At Risk (03/24/2024)   Humiliation, Afraid, Rape, and Kick questionnaire    Fear of Current or Ex-Partner: No    Emotionally Abused: No    Physically Abused: No    Sexually Abused: No   Allergies  Allergen Reactions   Lactose Intolerance (Gi) Other (See Comments)    GI Upset    Sulfa Antibiotics Rash    Last set of Vital Signs (not current) Vitals:   03/07/2024 0544 03/31/2024 1243  BP: 116/67   Pulse: 73   Resp: 18   Temp: (!) 97.5 F (36.4 C)   SpO2: 95% 97%      Physical Exam  Gen: unresponsive Cardiovascular: pulseless  Resp: apneic. Breath sounds equal bilaterally with bagging  Abd: nondistended  Neuro: GCS 3, unresponsive to  pain  HEENT: No blood in posterior pharynx, gag reflex absent  Neck: No crepitus  Musculoskeletal: No deformity  Skin: warm  Procedures  INTUBATION Performed by: Shira Dopp Required items: required blood products, implants, devices, and special equipment available Patient identity confirmed: provided demographic data and hospital-assigned identification number Time out: Immediately prior to procedure a "time out" was called to verify the correct patient, procedure, equipment, support staff and site/side marked as required. Indications: Apneic Intubation method: Laryngoscopy, MAC 4 Preoxygenation: BVM Sedatives: None Paralytic: None Tube Size: 7.5 cuffed Post-procedure assessment: chest rise and ETCO2 monitor Breath sounds: equal and absent over the epigastrium Tube secured by Respiratory Therapy Patient tolerated the procedure well with no immediate complications.   Cardiopulmonary Resuscitation (CPR) Procedure Note I directed and performed CPR for approximately 3 minutes Directed/Performed by: Shira Dopp I personally directed ancillary staff  and/or performed CPR in an effort to regain return of spontaneous circulation and to maintain cardiac, neuro and systemic perfusion.    Medical Decision making  Care of the patient was handed over to hospitalist  Assessment and Plan  Critically ill, after 1 defibrillation event the patient is back in what appears to be ventricular tachycardia with a pulse or sinus tachycardia, it appears wide-complex    Early Glisson, MD 03/25/2024 1254

## 2024-04-03 NOTE — Progress Notes (Signed)
 1226 patient arrived from 300 floor on bed patient alert to self confused rolling in the bed unable to follow commands. Stated birth date and says "no" when asked if she was at home 1226 patient going in and out of Vtach and ST on EKG when connected to monitor patient pulling off oxygen and stated "I need to Poop" patient already had pooped thrashing in bed went into VT eyes rolled back and went unresponsive code called  May 02, 1227 Zoll pads connected 1229 compressions started EPI given 1230 patient shocked  1233 pulse obtained while patient being intubated 1244 EKG completed  1248 no pulse patient unresponsive again family at bedside said no CPR Dr at bedside 05/02/1251 Two RNs pronounced patients death family at bedside

## 2024-04-03 NOTE — Progress Notes (Addendum)
 CODE BLUE   1226 Patient tx from 300 alert to self, denied pain. Pt not following directions while placing on ICU equipment. Appeared to be in ST and went in and out of VTach. Patient stooled & then went unresponsive  1228 Code Blue called. VTach sustained.  1229 Compression started, placed on ZOLL 1230 Staff arrived 1230 Vtach confirmed on ZOLL, pulseless. 1mg  of Epi given. Shocked @ 200j 1232 1mg  epi given, compressions continued. CBG 124. 1233 Pulse by doppler found. Appears to be in ventricular tachycardia with a pulse or wide-complex tachycardia.  Dr. Annabell Key intubated with 7.5 ETT 24 @ lip.  1236 Pulse still palpable attempting to get BP. Verbal orders by Dr. Ezenduka for sedation. Attempting to get another IV site. Patient moving upper extremities. 1238 150mg  of Magnesium  given.  1240 Confirming rhythm by 12 lead EKG.  1244 Per EKG rate of 77 atrial sensed ventricular paced rhythm. BP cycled multiples times - 62/23 MAP 36 Propofol connected but never started d/t hypotension.  22 Family (daughter at bedside) confirmed DNR/I.  1248 Pulseless  1250 Dr. Hester Lot & Dr. Lincoln Renshaw speaking with family.  1252 Patient expired. Double RN confirmation - no pulse, no adventitious sounds auscultated.    1253 Per Dr. Hester Lot okay to removed ETT for family viewing. RT at bedside.

## 2024-04-03 NOTE — Progress Notes (Signed)
 PICC order received. Telephone call to ICU, states pt expired.

## 2024-04-03 NOTE — Plan of Care (Signed)
   Problem: Activity: Goal: Risk for activity intolerance will decrease Outcome: Progressing   Problem: Coping: Goal: Level of anxiety will decrease Outcome: Progressing   Problem: Safety: Goal: Ability to remain free from injury will improve Outcome: Progressing

## 2024-04-03 NOTE — Death Summary Note (Addendum)
 DEATH SUMMARY   Patient Details  Name: YAMINA LENIS MRN: [id number] DOB: 08/02/1941 NWG:NFAO, Jerlean Mood, MD Admission/Discharge Information   Admit Date:  04/20/2024  Date of Death: Date of Death: 23-Apr-2024  Time of Death: Time of Death: 05-01-51  Length of Stay: 3   Principle Cause of death:  Severe sepsis likely 2/2 postobstructive pneumonia complicated by [facility]  Hospital Diagnoses: Principal Problem:   Pneumonia Active Problems:   TOBACCO USER   CAD- remote MI '90s, distal RCA DES 07/17/14   Chronic systolic CHF (congestive heart failure), NYHA class 3 (HCC)   Stroke (HCC)   ICD (implantable cardioverter-defibrillator) in place   DIC (disseminated intravascular coagulation) (HCC)   Lung mass   Hospital Course: Mariangel Ringley is a 83 y.o. female, with PMH of CAD, ischemic cardiomyopathy, chronic systolic CHF, AICD, HTN, HLD, current tobacco abuse, presents to ED c/o generalized progressive weakness, weight loss, cough, SOB, recent fall, but denied LOC PTA. Pt was hypoxic on presentation, she does not use oxygen at home and continues to smoke. In the ED, vital signs showed tachypnea, with soft BP. Labs showed creatinine 1.66, T. bili 3.6, BNP greater than 2000, CK WNL, lactic acid 2.4-->2.1, WBC 14.9, D-dimer greater >20, fibrinogen less than 60, elevated INR/PT, noted to be in DIC. CTA chest with motion degraded exam, no evidence of PE to the proximal segmental level, noted right upper lobe mass about 5 cm with suspected mediastinal lymphadenopathy and postobstructive changes, noted sclerotic lesion present in the sternum which may represent a focus of bony mets. Heme/oncology consulted. Patient admitted for further management.     Earlier today, patient noted to be sleeping, easily arousable.  Denied any new complaints.  Reported she just wants to sleep.  Remained hemodynamically stable.  Due to her overall poor prognosis, with no improvement, extensive discussion with was held with  patient's sister over the phone, who initially wanted patient to be full code.  Due to this, patient was planned to be transferred to the ICU for closer observation.  Once patient was in the ICU, patient appeared to go in and out of V. Tach, had a large BM and went unresponsive.  Around 12:28 PM CODE BLUE was called and resuscitation was initiated.  Patient was given a total of 2 mg of epi and 1 shock at 200 J.  EDP Dr. Annabell Key was contacted promptly and intubated the patient at 12:33 of which at that time pulse was detected by Doppler.  Resuscitation efforts continued until around 12:47 PM, patient's daughter at bedside stated patient to be switched to DNR/I.  Shortly afterwards patient became pulseless, and daughter once again confirmed no further resuscitation.  Patient passed away at 12:52 PM, verified by to RN.  Patient was promptly extubated with family's permission.  Discussed extensively with family including patient's daughter, offered a chaplain's visit, of which they accepted.  Was available for further discussions and family.   Discussed extensively with the medical examiner (patient had a fall PTA), agreed that this will not be a medical examiner case due to extensive medical problems as well as very poor prognosis upon presentation    Assessment and Plan:  Severe sepsis likely 2/2 postobstructive pneumonia vs ?UTI Acute respiratory failure with hypoxia On presentation, tachypneic, leukocytosis, elevated lactic acid BC x 2 NGTD Strep pneumo and legionella negative CTA chest with motion degraded exam, no evidence of PE to the proximal segmental level, noted right upper lobe mass about 5 cm with suspected mediastinal  lymphadenopathy and postobstructive changes, noted sclerotic lesion present in the sternum which may represent a focus of bony mets Was on Vancomycin, cefepime, broad-spectrum antibiotics   Possible UTI UA showed large leukocytes, hemoglobin, positive nitrites, rare bacteria,  greater than 50 WBC UC no growth (culture was taken after antibiotics was initiated)   Hypokalemia Replaced as needed   DIC  Present on admission Noted elevated D-dimers, thrombocytopenia, low fibrinogen, elevated PT/APTT/INR, peripheral smear, with now schistocytes Likely 2/2 sepsis, in addition to possibly advanced malignancy Heme-onc consulted, recommend transfuse cryoprecipitate to keep fibrinogen above 100 to prevent bleeding, avoid FFP given history of heart failure Transfused cryoprecipitate on 03/25/2024, planned to transfuse another on 03/20/2024 Continued antibiotics for sepsis No evidence of bleeding/bruising noted earlier    Thrombocytopenia Likely 2/2 above Heme-onc on board   Lung mass suspicious for malignancy, 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 No previous chest image since 2016 CTA chest with motion degraded exam, no evidence of PE to the proximal segmental level, noted right upper lobe mass about 5 cm with suspected mediastinal lymphadenopathy and postobstructive changes, noted sclerotic lesion present in the sternum which may represent a focus of bony mets CT abdomen/pelvis with no evidence of metastatic disease MRI brain with and without contrast planned as an outpatient (has to be done in [facility] due to AICD) Oncology on board, recommend EBUS with biopsy when platelets improve Outpatient follow-up at [facility] center was planned to discuss further management   Right posterior tibial vein thrombosis/DVT Likely provoked secondary to malignancy and DIC Heme-onc recommended not to anticoagulate at this time considering platelets are less than 50, plan to start heparin  when platelets are greater than 50 and can be transitioned to DOAC on discharge if platelets remain above 50   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 Creatinine around baseline  Hyperbilirubinemia CT abdomen/pelvis with no evidence of metastatic disease Possibly from sepsis Vs end-stage heart  failure   Ischemic cardiomyopathy Chronic systolic CHF s/p AICD Does not appear overloaded, although BNP greater than 2000 Chest x-ray with cardiomegaly and mild diffuse interstitial opacities possibly due to edema/infection EKG with no acute ST changes Echo showed EF of 20 to 25%, global hypokinesis, previous EF noted to be 25 to 30% in 2022 Held BP meds due to soft BP in light of sepsis/DIC (held Coreg , Lasix , losartan , Aldactone )   Hx of CVA/HLD Held aspirin /statins  Tobacco abuse Ongoing tobacco use Advised to quit   GOC discussion Patient with very poor prognosis overall Discussed extensively with patient's sister about overall very poor prognosis on 03/13/2024 Palliative/hospice was consulted          Procedures: CPR, intubation  Consultations: Hematology/oncology  The results of significant diagnostics from this hospitalization (including imaging, microbiology, ancillary and laboratory) are listed below for reference.   Significant Diagnostic Studies: US  EKG SITE RITE Result Date: 03/09/2024 If Site Rite image not attached, placement could not be confirmed due to current cardiac rhythm.  US  Venous Img Lower Bilateral (DVT) Result Date: 03/25/2024 CLINICAL DATA:  Elevated d-dimer EXAM: BILATERAL LOWER EXTREMITY VENOUS DOPPLER ULTRASOUND TECHNIQUE: Gray-scale sonography with graded compression, as well as color Doppler and duplex ultrasound were performed to evaluate the lower extremity deep venous systems from the level of the common femoral vein and including the common femoral, femoral, profunda femoral, popliteal and calf veins including the posterior tibial, peroneal and gastrocnemius veins when visible. The superficial great saphenous vein was also interrogated. Spectral Doppler was utilized to evaluate flow  at rest and with distal augmentation maneuvers in the common femoral, femoral and popliteal veins. COMPARISON:  None Available. FINDINGS: RIGHT LOWER EXTREMITY  Common Femoral Vein: No evidence of thrombus. Normal compressibility, respiratory phasicity and response to augmentation. Saphenofemoral Junction: No evidence of thrombus. Normal compressibility and flow on color Doppler imaging. Profunda Femoral Vein: No evidence of thrombus. Normal compressibility and flow on color Doppler imaging. Femoral Vein: No evidence of thrombus. Normal compressibility, respiratory phasicity and response to augmentation. Popliteal Vein: No evidence of thrombus. Normal compressibility, respiratory phasicity and response to augmentation. Calf Veins: There is poor compression and flow on color Doppler within the right posterior tibial vein. Superficial Great Saphenous Vein: No evidence of thrombus. Normal compressibility. Venous Reflux:  None. Other Findings: Cystic focus in the popliteal fossa measures 3.3 x 0.8 x 1.8 cm which is complex. No abnormal blood flow. Possible Baker's or synovial cyst. LEFT LOWER EXTREMITY Common Femoral Vein: No evidence of thrombus. Normal compressibility, respiratory phasicity and response to augmentation. Saphenofemoral Junction: No evidence of thrombus. Normal compressibility and flow on color Doppler imaging. Profunda Femoral Vein: No evidence of thrombus. Normal compressibility and flow on color Doppler imaging. Femoral Vein: No evidence of thrombus. Normal compressibility, respiratory phasicity and response to augmentation. Popliteal Vein: No evidence of thrombus. Normal compressibility, respiratory phasicity and response to augmentation. Calf Veins: No evidence of thrombus. Normal compressibility and flow on color Doppler imaging. Superficial Great Saphenous Vein: No evidence of thrombus. Normal compressibility. Venous Reflux:  None. Other Findings:  None. IMPRESSION: Right posterior tibial vein thrombus. Right-sided presumed Baker's cyst or other synovial cyst. Electronically Signed   By: Adrianna Horde M.D.   On: 03/25/2024 12:57   ECHOCARDIOGRAM  COMPLETE Result Date: 03/25/2024    ECHOCARDIOGRAM REPORT   Patient Name:   MARSELLA SUMAN Date of Exam: 03/25/2024 Medical Rec #:  [id number]     Height:       62.0 in Accession #:    [id number]    Weight:       123.0 lb Date of Birth:  [birth year]     BSA:          1.555 m Patient Age:    83 years      BP:           121/60 mmHg Patient Gender: F             HR:           73 bpm. Exam Location:  Inpatient Procedure: 2D Echo (Both Spectral and Color Flow Doppler were utilized during            procedure). Indications:    Dyspnea R06.00  History:        Patient has prior history of Echocardiogram examinations, most                 recent 05/02/2021.  Sonographer:    Hersey Lorenzo RDCS Referring Phys: [id number] Aminta Baldy J Durward Matranga IMPRESSIONS  1. Left ventricular ejection fraction, by estimation, is 20 to 25%. The left ventricle has severely decreased function. The left ventricle demonstrates global hypokinesis. The left ventricular internal cavity size was severely dilated. Left ventricular diastolic parameters are indeterminate. Elevated left atrial pressure.  2. Right ventricular systolic function is normal. The right ventricular size is normal. There is normal pulmonary artery systolic pressure.  3. Left atrial size was severely dilated.  4. The mitral valve is abnormal. Mild to moderate mitral valve regurgitation. No evidence of mitral stenosis.  5. The tricuspid valve is abnormal.  6. The aortic valve is tricuspid. Aortic valve regurgitation is not visualized. No aortic stenosis is present.  7. The inferior vena cava is normal in size with greater than 50% respiratory variability, suggesting right atrial pressure of 3 mmHg. FINDINGS  Left Ventricle: Left ventricular ejection fraction, by estimation, is 20 to 25%. The left ventricle has severely decreased function. The left ventricle demonstrates global hypokinesis. The left ventricular internal cavity size was severely dilated. There is no left ventricular  hypertrophy. Left ventricular diastolic parameters are indeterminate. Elevated left atrial pressure. Right Ventricle: The right ventricular size is normal. Right vetricular wall thickness was not well visualized. Right ventricular systolic function is normal. There is normal pulmonary artery systolic pressure. The tricuspid regurgitant velocity is 2.42 m/s, and with an assumed right atrial pressure of 3 mmHg, the estimated right ventricular systolic pressure is 26.4 mmHg. Left Atrium: Left atrial size was severely dilated. Right Atrium: Right atrial size was normal in size. Pericardium: There is no evidence of pericardial effusion. Mitral Valve: The mitral valve is abnormal. Mild to moderate mitral valve regurgitation. No evidence of mitral valve stenosis. Tricuspid Valve: The tricuspid valve is abnormal. Tricuspid valve regurgitation is mild . No evidence of tricuspid stenosis. Aortic Valve: The aortic valve is tricuspid. Aortic valve regurgitation is not visualized. No aortic stenosis is present. Aortic valve mean gradient measures 1.5 mmHg. Aortic valve peak gradient measures 3.9 mmHg. Aortic valve area, by VTI measures 2.00 cm. Pulmonic Valve: The pulmonic valve was not well visualized. Pulmonic valve regurgitation is mild. No evidence of pulmonic stenosis. Aorta: The aortic root and ascending aorta are structurally normal, with no evidence of dilitation. Venous: The inferior vena cava is normal in size with greater than 50% respiratory variability, suggesting right atrial pressure of 3 mmHg. IAS/Shunts: The interatrial septum was not well visualized. Additional Comments: A is visualized in the right atrium and right ventricle.  LEFT VENTRICLE PLAX 2D LVIDd:         6.70 cm      Diastology LVIDs:         6.20 cm      LV e' medial:    5.22 cm/s LV PW:         0.90 cm      LV E/e' medial:  21.1 LV IVS:        0.80 cm      LV e' lateral:   5.66 cm/s LVOT diam:     1.80 cm      LV E/e' lateral: 19.4 LV SV:          37 LV SV Index:   24 LVOT Area:     2.54 cm  LV Volumes (MOD) LV vol d, MOD A4C: 122.0 ml LV vol s, MOD A4C: 89.1 ml LV SV MOD A4C:     122.0 ml RIGHT VENTRICLE            IVC RV S prime:     9.36 cm/s  IVC diam: 1.50 cm TAPSE (M-mode): 1.5 cm LEFT ATRIUM            Index LA diam:      5.10 cm  3.28 cm/m LA Vol (A2C): 24.7 ml  15.89 ml/m LA Vol (A4C): 100.0 ml 64.31 ml/m  AORTIC VALVE AV Area (Vmax):    2.02 cm AV Area (Vmean):   2.12 cm AV Area (VTI):     2.00 cm AV Vmax:  98.82 cm/s AV Vmean:          55.070 cm/s AV VTI:            0.186 m AV Peak Grad:      3.9 mmHg AV Mean Grad:      1.5 mmHg LVOT Vmax:         78.30 cm/s LVOT Vmean:        45.800 cm/s LVOT VTI:          0.146 m LVOT/AV VTI ratio: 0.78  AORTA Ao Root diam: 2.40 cm Ao Asc diam:  2.50 cm MITRAL VALVE                 TRICUSPID VALVE MV Area (PHT): 2.82 cm      TR Peak grad:   23.4 mmHg MV Decel Time: 269 msec      TR Vmax:        242.00 cm/s MR Peak grad:   115.8 mmHg MR Mean grad:   77.0 mmHg    SHUNTS MR Vmax:        538.00 cm/s  Systemic VTI:  0.15 m MR Vmean:       420.0 cm/s   Systemic Diam: 1.80 cm MR PISA:        1.01 cm MR PISA Radius: 0.40 cm MV E velocity: 110.00 cm/s MV A velocity: 109.00 cm/s MV E/A ratio:  1.01 Armida Lander MD Electronically signed by Armida Lander MD Signature Date/Time: 03/25/2024/12:04:15 PM    Final    CT Angio Chest PE W/Cm &/Or Wo Cm Result Date: 03/24/2024 CLINICAL DATA:  Pulmonary embolism suspected EXAM: CT ANGIOGRAPHY CHEST WITH CONTRAST TECHNIQUE: Multidetector CT imaging of the chest was performed using the standard protocol during bolus administration of intravenous contrast. Multiplanar CT image reconstructions and MIPs were obtained to evaluate the vascular anatomy. Multiplanar image (3D post-processing) reconstructions and MIPs were obtained to evaluate the vascular anatomy. RADIATION DOSE REDUCTION: This exam was performed according to the departmental dose-optimization program  which includes automated exposure control, adjustment of the mA and/or kV according to patient size and/or use of iterative reconstruction technique. CONTRAST:  75mL OMNIPAQUE  IOHEXOL  350 MG/ML SOLN COMPARISON:  None Available. FINDINGS: Cardiovascular: No evidence of pulmonary embolism to the proximal segmental level. Heart is enlarged. Coronary artery atherosclerotic vascular disease is present in the left coronary territory. A left chest wall implant is present with leads terminating in the heart. Main pulmonary artery is at upper limits of normal for diameter. Mediastinum/Nodes: There is limited evaluation for mediastinal lymphadenopathy secondary to patient motion, beam hardening artifact, and phase of contrast agent acquisition. Although not clearly seen, enlarged nodal tissue is suspected. A possible enlarged lymph node is annotated adjacent to the proximal right mainstem bronchus estimated at 1.2 cm on image 106 of series 8. Lungs/Pleura: The lesion is present in the medial right upper lobe which measures 5.0 x 4.3 cm on image 95 of CT series 8. This is solid and extends medially into the mediastinum without clearly defined margins. Peripheral to this lesion there are areas of airspace consolidation which may be postobstructive. Additionally, the pulmonary arterial structures which course through this lesion are narrowed. Trace right-sided pleural effusion. Upper Abdomen: Degraded by patient motion Musculoskeletal: A sclerotic lesion is present in the right aspect of the sternum on image 76 of CT series 8 measuring 1.2 x 0.8 cm. Review of the MIP images confirms the above findings. IMPRESSION: 1. Motion degraded examination. 2. No evidence of  pulmonary embolism to the proximal segmental level. 3. A right upper lobe mass is favored which measures approximately 5.0 cm and abuts the right mediastinum with indistinct margins. Additionally, there is suspected mediastinal lymphadenopathy, and postobstructive  changes. 4. A sclerotic lesion is present in the sternum which may represent a focus of bony metastasis. 5. Enlarged heart with coronary artery atherosclerotic vascular disease. Electronically Signed   By: Reagan Camera M.D.   On: 03/24/2024 16:02   CT ABDOMEN PELVIS W CONTRAST Result Date: 03/24/2024 CLINICAL DATA:  Epigastric pain. EXAM: CT ABDOMEN AND PELVIS WITH CONTRAST TECHNIQUE: Multidetector CT imaging of the abdomen and pelvis was performed using the standard protocol following bolus administration of intravenous contrast. RADIATION DOSE REDUCTION: This exam was performed according to the departmental dose-optimization program which includes automated exposure control, adjustment of the mA and/or kV according to patient size and/or use of iterative reconstruction technique. CONTRAST:  75mL OMNIPAQUE  IOHEXOL  350 MG/ML SOLN COMPARISON:  CT abdomen pelvis dated 01/29/2024. FINDINGS: Evaluation of this exam is limited due to respiratory motion. Lower chest: There is mild eventration of the right hemidiaphragm. There are bibasilar atelectasis. Small right pleural effusion. There is cardiomegaly. No intra-abdominal free air.  Small free fluid in the pelvis. Hepatobiliary: Fatty liver. No biliary dilatation. Small gallstones. Pancreas: Unremarkable. No pancreatic ductal dilatation or surrounding inflammatory changes. Spleen: Normal in size without focal abnormality. Adrenals/Urinary Tract: The adrenal glands unremarkable. Mild bilateral renal parenchyma atrophy. There is no hydronephrosis on either side. The visualized ureters appear unremarkable. Air within the urinary bladder may have been introduced by recent instrumentation. Stomach/Bowel: There is sigmoid diverticulosis with muscular hypertrophy. No active inflammatory changes. There is no bowel obstruction. The appendix is normal. Vascular/Lymphatic: Advanced aortoiliac atherosclerotic disease. The IVC is unremarkable. No portal venous gas. There is no  adenopathy. Reproductive: Hysterectomy.  No suspicious adnexal masses. Other: Diffuse subcutaneous edema and anasarca. Musculoskeletal: Osteopenia with degenerative changes of spine. No acute osseous pathology. IMPRESSION: 1. No acute intra-abdominal or pelvic pathology. 2. Sigmoid diverticulosis. No bowel obstruction. Normal appendix. 3. Fatty liver. 4. Cholelithiasis. 5. Cardiomegaly with small right pleural effusion. 6.  Aortic Atherosclerosis (ICD10-I70.0). Electronically Signed   By: Angus Bark M.D.   On: 03/24/2024 16:01   DG Chest Port 1 View Result Date: 03/24/2024 CLINICAL DATA:  Cough EXAM: PORTABLE CHEST 1 VIEW COMPARISON:  04/19/2015 FINDINGS: Left-sided multi lead pacing device. Cardiomegaly with aortic atherosclerosis. Mild diffuse interstitial opacities with suspected small right pleural effusion and heterogeneous airspace disease at right base. Vague right paratracheal opacity. Probable skin fold artifact over the right lateral lower chest. IMPRESSION: 1. Cardiomegaly with mild diffuse interstitial opacities which may be due to edema or infection. Suspected small right pleural effusion with heterogeneous airspace disease at right base which may be due to atelectasis or pneumonia. 2. Vague right paratracheal opacity, which may be due to adenopathy, mass or infiltrate. Electronically Signed   By: Esmeralda Hedge M.D.   On: 03/24/2024 15:34   CT Head Wo Contrast Result Date: 03/24/2024 CLINICAL DATA:  Altered mental status, weakness EXAM: CT HEAD WITHOUT CONTRAST CT CERVICAL SPINE WITHOUT CONTRAST TECHNIQUE: Multidetector CT imaging of the head and cervical spine was performed following the standard protocol without intravenous contrast. Multiplanar CT image reconstructions of the cervical spine were also generated. RADIATION DOSE REDUCTION: This exam was performed according to the departmental dose-optimization program which includes automated exposure control, adjustment of the mA and/or kV  according to patient size and/or use of iterative reconstruction  technique. COMPARISON:  04/30/2015 FINDINGS: CT HEAD FINDINGS Brain: No evidence of acute infarction, hemorrhage, hydrocephalus, extra-axial collection or mass lesion/mass effect. Periventricular white matter hypodensity. Small lacunar infarction of the left caudate (series 2, image 13). Small focus of encephalomalacia of the posterior left frontal lobe (series 2, image 16). Vascular: No hyperdense vessel or unexpected calcification. Skull: Normal. Negative for fracture or focal lesion. Sinuses/Orbits: No acute finding. Other: None. CT CERVICAL SPINE FINDINGS Alignment: Degenerative straightening of the normal cervical lordosis. Skull base and vertebrae: No acute fracture. No primary bone lesion or focal pathologic process. Soft tissues and spinal canal: No prevertebral fluid or swelling. No visible canal hematoma. Disc levels:  Severe multilevel cervical disc degenerative disease. Upper chest: Negative. Other: None. IMPRESSION: 1. No acute intracranial pathology. 2. 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 Small focus of encephalomalacia of the posterior left frontal lobe. 3. No fracture or static subluxation of the cervical spine. 4. Severe multilevel cervical disc degenerative disease. Electronically Signed   By: Fredricka Jenny M.D.   On: 03/24/2024 14:29   CT Cervical Spine Wo Contrast Result Date: 03/24/2024 CLINICAL DATA:  Altered mental status, weakness EXAM: CT HEAD WITHOUT CONTRAST CT CERVICAL SPINE WITHOUT CONTRAST TECHNIQUE: Multidetector CT imaging of the head and cervical spine was performed following the standard protocol without intravenous contrast. Multiplanar CT image reconstructions of the cervical spine were also generated. RADIATION DOSE REDUCTION: This exam was performed according to the departmental dose-optimization program which includes automated exposure control, adjustment of the  mA and/or kV according to patient size and/or use of iterative reconstruction technique. COMPARISON:  04/30/2015 FINDINGS: CT HEAD FINDINGS Brain: No evidence of acute infarction, hemorrhage, hydrocephalus, extra-axial collection or mass lesion/mass effect. Periventricular white matter hypodensity. Small lacunar infarction of the left caudate (series 2, image 13). Small focus of encephalomalacia of the posterior left frontal lobe (series 2, image 16). Vascular: No hyperdense vessel or unexpected calcification. Skull: Normal. Negative for fracture or focal lesion. Sinuses/Orbits: No acute finding. Other: None. CT CERVICAL SPINE FINDINGS Alignment: Degenerative straightening of the normal cervical lordosis. Skull base and vertebrae: No acute fracture. No primary bone lesion or focal pathologic process. Soft tissues and spinal canal: No prevertebral fluid or swelling. No visible canal hematoma. Disc levels:  Severe multilevel cervical disc degenerative disease. Upper chest: Negative. Other: None. IMPRESSION: 1. No acute intracranial pathology. 2. 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 Small focus of encephalomalacia of the posterior left frontal lobe. 3. No fracture or static subluxation of the cervical spine. 4. Severe multilevel cervical disc degenerative disease. Electronically Signed   By: Fredricka Jenny M.D.   On: 03/24/2024 14:29   CUP PACEART REMOTE DEVICE CHECK Result Date: 03/24/2024 ICD Scheduled remote reviewed. Normal device function.  Presenting rhythm:  AP/BiV pace BiV pacing 88.2%, HF diagnostics currently abnormal 2 NSVT events, HR's 200-214, 6 & 9 beats in duration Next remote 91 days. LA, CVRS   Microbiology: Recent Results (from the past 240 hours)  Culture, blood (routine x 2)     Status: None (Preliminary result)   Collection Time: 03/24/24 12:52 PM   Specimen: BLOOD  Result Value Ref Range Status   Specimen Description BLOOD LEFT ANTECUBITAL   Final   Special Requests   Final    BOTTLES DRAWN AEROBIC AND ANAEROBIC Blood Culture adequate volume   Culture   Final    NO GROWTH 3 DAYS Performed at [facility],  [address], [place], [place] [address]    Report Status PENDING  Incomplete  Resp panel by RT-PCR (RSV, Flu A&B, Covid) Anterior Nasal Swab     Status: None   Collection Time: 03/24/24  1:20 PM   Specimen: Anterior Nasal Swab  Result Value Ref Range Status   SARS Coronavirus 2 by RT PCR NEGATIVE NEGATIVE Final    Comment: (NOTE) SARS-CoV-2 target nucleic acids are NOT DETECTED.  The SARS-CoV-2 RNA is generally detectable in upper respiratory specimens during the acute phase of infection. The lowest concentration of SARS-CoV-2 viral copies this assay can detect is 138 copies/mL. A negative result does not preclude SARS-Cov-2 infection and should not be used as the sole basis for treatment or other patient management decisions. A negative result may occur with  improper specimen collection/handling, submission of specimen other than nasopharyngeal swab, presence of viral mutation(s) within the areas targeted by this assay, and inadequate number of viral copies(<138 copies/mL). A negative result must be combined with clinical observations, patient history, and epidemiological information. The expected result is Negative.  Fact Sheet for Patients:  BloggerCourse.com  Fact Sheet for Healthcare Providers:  SeriousBroker.it  This test is no t yet approved or cleared by the [place]  FDA and  has been authorized for detection and/or diagnosis of SARS-CoV-2 by FDA under an Emergency Use Authorization (EUA). This EUA will remain  in effect (meaning this test can be used) for the duration of the COVID-19 declaration under Section 564(b)(1) of the Act, 21 U.S.C.section 360bbb-3(b)(1), unless the authorization is terminated  or revoked sooner.       Influenza A by  PCR NEGATIVE NEGATIVE Final   Influenza B by PCR NEGATIVE NEGATIVE Final    Comment: (NOTE) The Xpert Xpress SARS-CoV-2/FLU/RSV plus assay is intended as an aid in the diagnosis of influenza from Nasopharyngeal swab specimens and should not be used as a sole basis for treatment. Nasal washings and aspirates are unacceptable for Xpert Xpress SARS-CoV-2/FLU/RSV testing.  Fact Sheet for Patients: BloggerCourse.com  Fact Sheet for Healthcare Providers: SeriousBroker.it  This test is not yet approved or cleared by the [place]  FDA and has been authorized for detection and/or diagnosis of SARS-CoV-2 by FDA under an Emergency Use Authorization (EUA). This EUA will remain in effect (meaning this test can be used) for the duration of the COVID-19 declaration under Section 564(b)(1) of the Act, 21 U.S.C. section 360bbb-3(b)(1), unless the authorization is terminated or revoked.     Resp Syncytial Virus by PCR NEGATIVE NEGATIVE Final    Comment: (NOTE) Fact Sheet for Patients: BloggerCourse.com  Fact Sheet for Healthcare Providers: SeriousBroker.it  This test is not yet approved or cleared by the [place]  FDA and has been authorized for detection and/or diagnosis of SARS-CoV-2 by FDA under an Emergency Use Authorization (EUA). This EUA will remain in effect (meaning this test can be used) for the duration of the COVID-19 declaration under Section 564(b)(1) of the Act, 21 U.S.C. section 360bbb-3(b)(1), unless the authorization is terminated or revoked.  Performed at [facility], [address], [place], [place] [address]   Culture, blood (routine x 2)     Status: None (Preliminary result)   Collection Time: 03/24/24  2:33 PM   Specimen: BLOOD  Result Value Ref Range Status   Specimen Description BLOOD BLOOD LEFT ARM  Final   Special Requests   Final    BOTTLES DRAWN AEROBIC  ONLY Blood Culture results may not be optimal due to an inadequate  volume of blood received in culture bottles   Culture   Final    NO GROWTH 3 DAYS Performed at [facility], [address] [place], [place] [address]    Report Status PENDING  Incomplete  Urine Culture (for pregnant, neutropenic or urologic patients or patients with an indwelling urinary catheter)     Status: None   Collection Time: 03/25/24  9:00 AM   Specimen: Urine, Clean Catch  Result Value Ref Range Status   Specimen Description   Final    URINE, CLEAN CATCH Performed at [facility], [address], [place], [place] [address]    Special Requests   Final    NONE Performed at [facility], [address], [place], [place] [address]    Culture   Final    NO GROWTH Performed at [facility], [address], [place], [place] [address]    Report Status 03/26/2024 FINAL  Final  MRSA Next Gen by PCR, Nasal     Status: Abnormal   Collection Time: 03/26/24  9:16 AM   Specimen: Nasal Mucosa; Nasal Swab  Result Value Ref Range Status   MRSA by PCR Next Gen Not Detected (A) NOT DETECTED Final    Comment:        The GeneXpert MRSA Assay (FDA approved for NASAL specimens only), is one component of a comprehensive MRSA colonization surveillance program. It is not intended to diagnose MRSA infection nor to guide or monitor treatment for MRSA infections. Performed at [facility], [address] [place], [place] [address]     Time spent: 50 minutes  Signed: Veronica Gordon, MD 03/16/2024

## 2024-04-03 DEATH — deceased

## 2024-04-04 ENCOUNTER — Encounter

## 2024-05-12 NOTE — Addendum Note (Signed)
 Addended by: VICCI SELLER A on: 05/12/2024 01:33 PM   Modules accepted: Orders, Level of Service

## 2024-05-12 NOTE — Progress Notes (Signed)
 Remote ICD transmission.

## 2024-06-09 ENCOUNTER — Ambulatory Visit: Admitting: Student
